# Patient Record
Sex: Female | Born: 1945 | Race: Black or African American | Hispanic: No | Smoking: Former smoker
Health system: Southern US, Community
[De-identification: ages and names within clinical notes are randomized; demographics above are authoritative.]

## PROBLEM LIST (undated history)

## (undated) DIAGNOSIS — E785 Hyperlipidemia, unspecified: Secondary | ICD-10-CM

## (undated) DIAGNOSIS — I890 Lymphedema, not elsewhere classified: Secondary | ICD-10-CM

## (undated) DIAGNOSIS — K635 Polyp of colon: Secondary | ICD-10-CM

## (undated) DIAGNOSIS — Z950 Presence of cardiac pacemaker: Secondary | ICD-10-CM

## (undated) DIAGNOSIS — J45909 Unspecified asthma, uncomplicated: Secondary | ICD-10-CM

## (undated) DIAGNOSIS — R42 Dizziness and giddiness: Secondary | ICD-10-CM

## (undated) DIAGNOSIS — M545 Low back pain, unspecified: Secondary | ICD-10-CM

## (undated) DIAGNOSIS — I509 Heart failure, unspecified: Secondary | ICD-10-CM

## (undated) DIAGNOSIS — T7840XA Allergy, unspecified, initial encounter: Secondary | ICD-10-CM

## (undated) DIAGNOSIS — M1990 Unspecified osteoarthritis, unspecified site: Secondary | ICD-10-CM

## (undated) DIAGNOSIS — C50919 Malignant neoplasm of unspecified site of unspecified female breast: Secondary | ICD-10-CM

## (undated) DIAGNOSIS — Z95810 Presence of automatic (implantable) cardiac defibrillator: Secondary | ICD-10-CM

## (undated) DIAGNOSIS — I10 Essential (primary) hypertension: Secondary | ICD-10-CM

## (undated) DIAGNOSIS — I447 Left bundle-branch block, unspecified: Secondary | ICD-10-CM

## (undated) DIAGNOSIS — T753XXA Motion sickness, initial encounter: Secondary | ICD-10-CM

## (undated) DIAGNOSIS — I428 Other cardiomyopathies: Secondary | ICD-10-CM

## (undated) DIAGNOSIS — Z0000 Encounter for general adult medical examination without abnormal findings: Principal | ICD-10-CM

## (undated) DIAGNOSIS — K5900 Constipation, unspecified: Secondary | ICD-10-CM

## (undated) HISTORY — DX: Left bundle-branch block, unspecified: I44.7

## (undated) HISTORY — DX: Constipation, unspecified: K59.00

## (undated) HISTORY — PX: POLYPECTOMY: SHX149

## (undated) HISTORY — DX: Essential (primary) hypertension: I10

## (undated) HISTORY — DX: Dizziness and giddiness: R42

## (undated) HISTORY — DX: Low back pain: M54.5

## (undated) HISTORY — PX: KNEE ARTHROSCOPY: SHX127

## (undated) HISTORY — DX: Allergy, unspecified, initial encounter: T78.40XA

## (undated) HISTORY — PX: COLONOSCOPY: SHX174

## (undated) HISTORY — DX: Encounter for general adult medical examination without abnormal findings: Z00.00

## (undated) HISTORY — DX: Motion sickness, initial encounter: T75.3XXA

## (undated) HISTORY — DX: Hyperlipidemia, unspecified: E78.5

## (undated) HISTORY — DX: Unspecified osteoarthritis, unspecified site: M19.90

## (undated) HISTORY — PX: VAGINAL HYSTERECTOMY: SUR661

## (undated) HISTORY — PX: CARDIAC CATHETERIZATION: SHX172

## (undated) HISTORY — PX: TONSILLECTOMY: SUR1361

## (undated) HISTORY — DX: Unspecified asthma, uncomplicated: J45.909

## (undated) HISTORY — DX: Polyp of colon: K63.5

## (undated) HISTORY — DX: Other cardiomyopathies: I42.8

## (undated) HISTORY — DX: Low back pain, unspecified: M54.50

---

## 1998-01-18 DIAGNOSIS — C50919 Malignant neoplasm of unspecified site of unspecified female breast: Secondary | ICD-10-CM

## 1998-01-18 HISTORY — DX: Malignant neoplasm of unspecified site of unspecified female breast: C50.919

## 1998-01-18 HISTORY — PX: BREAST LUMPECTOMY: SHX2

## 1998-01-18 HISTORY — PX: BREAST BIOPSY: SHX20

## 2003-09-19 ENCOUNTER — Encounter: Admission: RE | Admit: 2003-09-19 | Discharge: 2003-09-19 | Payer: Self-pay | Admitting: Hematology & Oncology

## 2003-12-09 ENCOUNTER — Ambulatory Visit: Payer: Self-pay | Admitting: Hematology & Oncology

## 2003-12-11 ENCOUNTER — Ambulatory Visit (HOSPITAL_COMMUNITY): Admission: RE | Admit: 2003-12-11 | Discharge: 2003-12-11 | Payer: Self-pay | Admitting: Hematology & Oncology

## 2004-01-14 ENCOUNTER — Ambulatory Visit (HOSPITAL_COMMUNITY): Admission: RE | Admit: 2004-01-14 | Discharge: 2004-01-14 | Payer: Self-pay | Admitting: Hematology & Oncology

## 2004-02-10 ENCOUNTER — Ambulatory Visit: Payer: Self-pay | Admitting: Hematology & Oncology

## 2004-03-03 ENCOUNTER — Ambulatory Visit (HOSPITAL_COMMUNITY): Admission: RE | Admit: 2004-03-03 | Discharge: 2004-03-03 | Payer: Self-pay | Admitting: Hematology & Oncology

## 2004-03-25 ENCOUNTER — Ambulatory Visit: Payer: Self-pay | Admitting: Hematology & Oncology

## 2004-06-25 ENCOUNTER — Ambulatory Visit: Payer: Self-pay | Admitting: Hematology & Oncology

## 2004-07-14 ENCOUNTER — Emergency Department (HOSPITAL_COMMUNITY): Admission: EM | Admit: 2004-07-14 | Discharge: 2004-07-15 | Payer: Self-pay | Admitting: Emergency Medicine

## 2004-08-17 ENCOUNTER — Encounter: Admission: RE | Admit: 2004-08-17 | Discharge: 2004-08-17 | Payer: Self-pay | Admitting: Orthopaedic Surgery

## 2004-09-08 ENCOUNTER — Ambulatory Visit: Payer: Self-pay | Admitting: Hematology & Oncology

## 2004-09-09 ENCOUNTER — Ambulatory Visit (HOSPITAL_COMMUNITY): Admission: RE | Admit: 2004-09-09 | Discharge: 2004-09-09 | Payer: Self-pay | Admitting: Hematology & Oncology

## 2004-11-26 ENCOUNTER — Ambulatory Visit: Payer: Self-pay | Admitting: Hematology & Oncology

## 2005-01-18 HISTORY — PX: TRIGGER FINGER RELEASE: SHX641

## 2005-01-18 HISTORY — PX: CARPAL TUNNEL RELEASE: SHX101

## 2005-01-25 ENCOUNTER — Ambulatory Visit: Payer: Self-pay | Admitting: Hematology & Oncology

## 2005-02-16 ENCOUNTER — Ambulatory Visit (HOSPITAL_COMMUNITY): Admission: RE | Admit: 2005-02-16 | Discharge: 2005-02-16 | Payer: Self-pay | Admitting: Hematology & Oncology

## 2005-05-20 ENCOUNTER — Ambulatory Visit: Payer: Self-pay | Admitting: Hematology & Oncology

## 2005-05-24 LAB — CBC WITH DIFFERENTIAL/PLATELET
BASO%: 0.5 % (ref 0.0–2.0)
EOS%: 4.4 % (ref 0.0–7.0)
Eosinophils Absolute: 0.3 10*3/uL (ref 0.0–0.5)
LYMPH%: 32.5 % (ref 14.0–48.0)
MCH: 30.4 pg (ref 26.0–34.0)
MCHC: 33.8 g/dL (ref 32.0–36.0)
MCV: 89.7 fL (ref 81.0–101.0)
MONO%: 6.6 % (ref 0.0–13.0)
Platelets: 279 10*3/uL (ref 145–400)
RBC: 3.93 10*6/uL (ref 3.70–5.32)
RDW: 14.5 % (ref 11.3–14.5)

## 2005-05-24 LAB — COMPREHENSIVE METABOLIC PANEL
AST: 27 U/L (ref 0–37)
Albumin: 4.2 g/dL (ref 3.5–5.2)
BUN: 11 mg/dL (ref 6–23)
Calcium: 9.4 mg/dL (ref 8.4–10.5)
Chloride: 102 mEq/L (ref 96–112)
Glucose, Bld: 142 mg/dL — ABNORMAL HIGH (ref 70–99)
Potassium: 3.7 mEq/L (ref 3.5–5.3)

## 2005-07-12 ENCOUNTER — Encounter: Payer: Self-pay | Admitting: Cardiology

## 2005-07-16 ENCOUNTER — Ambulatory Visit: Payer: Self-pay | Admitting: Hematology & Oncology

## 2005-07-19 ENCOUNTER — Ambulatory Visit (HOSPITAL_COMMUNITY): Admission: RE | Admit: 2005-07-19 | Discharge: 2005-07-19 | Payer: Self-pay | Admitting: Hematology & Oncology

## 2005-07-26 LAB — COMPREHENSIVE METABOLIC PANEL
ALT: 19 U/L (ref 0–40)
AST: 20 U/L (ref 0–37)
Albumin: 4.1 g/dL (ref 3.5–5.2)
Alkaline Phosphatase: 94 U/L (ref 39–117)
BUN: 12 mg/dL (ref 6–23)
Calcium: 9.6 mg/dL (ref 8.4–10.5)
Chloride: 106 mEq/L (ref 96–112)
Potassium: 4.1 mEq/L (ref 3.5–5.3)
Sodium: 139 mEq/L (ref 135–145)
Total Protein: 7.5 g/dL (ref 6.0–8.3)

## 2005-07-26 LAB — CBC WITH DIFFERENTIAL/PLATELET
Basophils Absolute: 0 10*3/uL (ref 0.0–0.1)
EOS%: 5 % (ref 0.0–7.0)
HGB: 12.1 g/dL (ref 11.6–15.9)
MCH: 30.9 pg (ref 26.0–34.0)
MCV: 90.8 fL (ref 81.0–101.0)
MONO%: 9.9 % (ref 0.0–13.0)
NEUT#: 2.6 10*3/uL (ref 1.5–6.5)
RBC: 3.91 10*6/uL (ref 3.70–5.32)
RDW: 14.9 % — ABNORMAL HIGH (ref 11.3–14.5)
lymph#: 2.6 10*3/uL (ref 0.9–3.3)

## 2005-10-06 ENCOUNTER — Encounter: Admission: RE | Admit: 2005-10-06 | Discharge: 2005-10-06 | Payer: Self-pay | Admitting: Orthopaedic Surgery

## 2005-10-21 ENCOUNTER — Ambulatory Visit: Payer: Self-pay | Admitting: Hematology & Oncology

## 2005-10-25 LAB — CBC WITH DIFFERENTIAL/PLATELET
Basophils Absolute: 0 10*3/uL (ref 0.0–0.1)
HCT: 36.1 % (ref 34.8–46.6)
HGB: 12.6 g/dL (ref 11.6–15.9)
MONO#: 0.5 10*3/uL (ref 0.1–0.9)
NEUT#: 2.5 10*3/uL (ref 1.5–6.5)
NEUT%: 38.1 % — ABNORMAL LOW (ref 39.6–76.8)
RDW: 14.2 % (ref 11.3–14.5)
WBC: 6.5 10*3/uL (ref 3.9–10.0)
lymph#: 3.1 10*3/uL (ref 0.9–3.3)

## 2005-10-25 LAB — COMPREHENSIVE METABOLIC PANEL
ALT: 18 U/L (ref 0–40)
Albumin: 4.3 g/dL (ref 3.5–5.2)
BUN: 15 mg/dL (ref 6–23)
CO2: 27 mEq/L (ref 19–32)
Calcium: 9.1 mg/dL (ref 8.4–10.5)
Chloride: 101 mEq/L (ref 96–112)
Creatinine, Ser: 0.81 mg/dL (ref 0.40–1.20)
Potassium: 3.6 mEq/L (ref 3.5–5.3)

## 2005-10-25 LAB — CANCER ANTIGEN 27.29: CA 27.29: 45 U/mL — ABNORMAL HIGH (ref 0–39)

## 2005-11-04 ENCOUNTER — Ambulatory Visit (HOSPITAL_BASED_OUTPATIENT_CLINIC_OR_DEPARTMENT_OTHER): Admission: RE | Admit: 2005-11-04 | Discharge: 2005-11-04 | Payer: Self-pay | Admitting: Orthopedic Surgery

## 2006-01-19 ENCOUNTER — Ambulatory Visit: Payer: Self-pay | Admitting: Hematology & Oncology

## 2006-01-24 LAB — COMPREHENSIVE METABOLIC PANEL
ALT: 18 U/L (ref 0–35)
AST: 18 U/L (ref 0–37)
Albumin: 4.3 g/dL (ref 3.5–5.2)
Alkaline Phosphatase: 97 U/L (ref 39–117)
Potassium: 3.8 mEq/L (ref 3.5–5.3)
Sodium: 140 mEq/L (ref 135–145)
Total Protein: 7.7 g/dL (ref 6.0–8.3)

## 2006-01-24 LAB — CBC WITH DIFFERENTIAL/PLATELET
BASO%: 1.4 % (ref 0.0–2.0)
EOS%: 4.8 % (ref 0.0–7.0)
Eosinophils Absolute: 0.3 10*3/uL (ref 0.0–0.5)
MCH: 28.5 pg (ref 26.0–34.0)
MCHC: 31.6 g/dL — ABNORMAL LOW (ref 32.0–36.0)
MCV: 90 fL (ref 81.0–101.0)
MONO%: 8.2 % (ref 0.0–13.0)
NEUT#: 2.7 10*3/uL (ref 1.5–6.5)
RBC: 4.23 10*6/uL (ref 3.70–5.32)
RDW: 15.1 % — ABNORMAL HIGH (ref 11.3–14.5)

## 2006-03-02 ENCOUNTER — Ambulatory Visit (HOSPITAL_COMMUNITY): Admission: RE | Admit: 2006-03-02 | Discharge: 2006-03-02 | Payer: Self-pay | Admitting: Hematology & Oncology

## 2006-03-23 ENCOUNTER — Ambulatory Visit: Payer: Self-pay | Admitting: Hematology & Oncology

## 2006-05-09 ENCOUNTER — Ambulatory Visit: Payer: Self-pay | Admitting: Hematology & Oncology

## 2006-09-01 ENCOUNTER — Ambulatory Visit: Payer: Self-pay | Admitting: Hematology & Oncology

## 2006-09-05 LAB — CBC WITH DIFFERENTIAL/PLATELET
BASO%: 1 % (ref 0.0–2.0)
EOS%: 5.2 % (ref 0.0–7.0)
HCT: 36.6 % (ref 34.8–46.6)
LYMPH%: 47.4 % (ref 14.0–48.0)
MCH: 31 pg (ref 26.0–34.0)
MCHC: 35.4 g/dL (ref 32.0–36.0)
NEUT%: 37.8 % — ABNORMAL LOW (ref 39.6–76.8)
Platelets: 264 10*3/uL (ref 145–400)

## 2006-09-05 LAB — COMPREHENSIVE METABOLIC PANEL
ALT: 19 U/L (ref 0–35)
AST: 22 U/L (ref 0–37)
CO2: 23 mEq/L (ref 19–32)
Creatinine, Ser: 0.72 mg/dL (ref 0.40–1.20)
Total Bilirubin: 0.4 mg/dL (ref 0.3–1.2)

## 2006-09-05 LAB — CANCER ANTIGEN 27.29: CA 27.29: 37 U/mL (ref 0–39)

## 2006-12-29 ENCOUNTER — Ambulatory Visit: Payer: Self-pay | Admitting: Hematology & Oncology

## 2007-01-02 LAB — COMPREHENSIVE METABOLIC PANEL
AST: 18 U/L (ref 0–37)
Albumin: 4.1 g/dL (ref 3.5–5.2)
BUN: 11 mg/dL (ref 6–23)
CO2: 25 mEq/L (ref 19–32)
Calcium: 9.8 mg/dL (ref 8.4–10.5)
Chloride: 103 mEq/L (ref 96–112)
Creatinine, Ser: 0.71 mg/dL (ref 0.40–1.20)
Glucose, Bld: 85 mg/dL (ref 70–99)

## 2007-01-02 LAB — CBC WITH DIFFERENTIAL/PLATELET
Basophils Absolute: 0.1 10*3/uL (ref 0.0–0.1)
EOS%: 4.7 % (ref 0.0–7.0)
Eosinophils Absolute: 0.3 10*3/uL (ref 0.0–0.5)
HCT: 34.1 % — ABNORMAL LOW (ref 34.8–46.6)
HGB: 12 g/dL (ref 11.6–15.9)
MCH: 31.3 pg (ref 26.0–34.0)
NEUT#: 3.3 10*3/uL (ref 1.5–6.5)
NEUT%: 44.7 % (ref 39.6–76.8)
lymph#: 3.1 10*3/uL (ref 0.9–3.3)

## 2007-01-02 LAB — CANCER ANTIGEN 27.29: CA 27.29: 37 U/mL (ref 0–39)

## 2007-01-19 HISTORY — PX: APPENDECTOMY: SHX54

## 2007-04-26 ENCOUNTER — Ambulatory Visit: Payer: Self-pay | Admitting: Hematology & Oncology

## 2007-05-01 LAB — COMPREHENSIVE METABOLIC PANEL
AST: 17 U/L (ref 0–37)
BUN: 12 mg/dL (ref 6–23)
CO2: 24 mEq/L (ref 19–32)
Calcium: 10.2 mg/dL (ref 8.4–10.5)
Chloride: 102 mEq/L (ref 96–112)
Creatinine, Ser: 0.74 mg/dL (ref 0.40–1.20)
Glucose, Bld: 100 mg/dL — ABNORMAL HIGH (ref 70–99)

## 2007-05-01 LAB — CBC WITH DIFFERENTIAL/PLATELET
Basophils Absolute: 0.1 10*3/uL (ref 0.0–0.1)
EOS%: 4.3 % (ref 0.0–7.0)
Eosinophils Absolute: 0.3 10*3/uL (ref 0.0–0.5)
HCT: 35.8 % (ref 34.8–46.6)
HGB: 12.6 g/dL (ref 11.6–15.9)
MCH: 31.1 pg (ref 26.0–34.0)
NEUT%: 47 % (ref 39.6–76.8)
lymph#: 2.7 10*3/uL (ref 0.9–3.3)

## 2007-07-23 ENCOUNTER — Encounter (INDEPENDENT_AMBULATORY_CARE_PROVIDER_SITE_OTHER): Payer: Self-pay | Admitting: Surgery

## 2007-07-23 ENCOUNTER — Encounter: Payer: Self-pay | Admitting: Emergency Medicine

## 2007-07-23 ENCOUNTER — Inpatient Hospital Stay (HOSPITAL_COMMUNITY): Admission: EM | Admit: 2007-07-23 | Discharge: 2007-07-25 | Payer: Self-pay | Admitting: Emergency Medicine

## 2007-08-31 ENCOUNTER — Ambulatory Visit: Payer: Self-pay | Admitting: Hematology & Oncology

## 2007-09-04 LAB — CBC WITH DIFFERENTIAL (CANCER CENTER ONLY)
BASO%: 0.8 % (ref 0.0–2.0)
EOS%: 5.4 % (ref 0.0–7.0)
HCT: 37.1 % (ref 34.8–46.6)
LYMPH#: 3.4 10*3/uL — ABNORMAL HIGH (ref 0.9–3.3)
LYMPH%: 55.8 % — ABNORMAL HIGH (ref 14.0–48.0)
MCH: 30.2 pg (ref 26.0–34.0)
MCHC: 33.8 g/dL (ref 32.0–36.0)
MONO%: 6.4 % (ref 0.0–13.0)
NEUT%: 31.6 % — ABNORMAL LOW (ref 39.6–80.0)
RDW: 13.7 % (ref 10.5–14.6)

## 2007-09-04 LAB — CANCER ANTIGEN 27.29: CA 27.29: 42 U/mL — ABNORMAL HIGH (ref 0–39)

## 2007-09-04 LAB — COMPREHENSIVE METABOLIC PANEL
ALT: 18 U/L (ref 0–35)
AST: 21 U/L (ref 0–37)
CO2: 26 mEq/L (ref 19–32)
Creatinine, Ser: 0.68 mg/dL (ref 0.40–1.20)
Total Bilirubin: 0.3 mg/dL (ref 0.3–1.2)

## 2007-12-29 ENCOUNTER — Ambulatory Visit: Payer: Self-pay | Admitting: Hematology & Oncology

## 2008-01-01 LAB — CBC WITH DIFFERENTIAL (CANCER CENTER ONLY)
BASO%: 0.7 % (ref 0.0–2.0)
HCT: 37.7 % (ref 34.8–46.6)
LYMPH%: 56.5 % — ABNORMAL HIGH (ref 14.0–48.0)
MCHC: 34.1 g/dL (ref 32.0–36.0)
MCV: 90 fL (ref 81–101)
MONO#: 0.6 10*3/uL (ref 0.1–0.9)
NEUT%: 29.9 % — ABNORMAL LOW (ref 39.6–80.0)
RDW: 12.8 % (ref 10.5–14.6)
WBC: 6.6 10*3/uL (ref 3.9–10.0)

## 2008-01-01 LAB — COMPREHENSIVE METABOLIC PANEL
ALT: 16 U/L (ref 0–35)
AST: 17 U/L (ref 0–37)
Albumin: 4.4 g/dL (ref 3.5–5.2)
Calcium: 10 mg/dL (ref 8.4–10.5)
Chloride: 102 mEq/L (ref 96–112)
Potassium: 4 mEq/L (ref 3.5–5.3)

## 2008-03-13 ENCOUNTER — Encounter: Payer: Self-pay | Admitting: Cardiology

## 2008-03-21 ENCOUNTER — Encounter: Payer: Self-pay | Admitting: Cardiology

## 2008-03-26 ENCOUNTER — Encounter: Payer: Self-pay | Admitting: Cardiology

## 2008-04-19 ENCOUNTER — Ambulatory Visit: Payer: Self-pay | Admitting: Hematology & Oncology

## 2008-04-22 LAB — CBC WITH DIFFERENTIAL (CANCER CENTER ONLY)
Eosinophils Absolute: 0.4 10*3/uL (ref 0.0–0.5)
HCT: 35.9 % (ref 34.8–46.6)
HGB: 11.7 g/dL (ref 11.6–15.9)
LYMPH%: 49.2 % — ABNORMAL HIGH (ref 14.0–48.0)
MCV: 92 fL (ref 81–101)
MONO#: 0.4 10*3/uL (ref 0.1–0.9)
NEUT%: 37.8 % — ABNORMAL LOW (ref 39.6–80.0)
RBC: 3.91 10*6/uL (ref 3.70–5.32)
WBC: 6.4 10*3/uL (ref 3.9–10.0)

## 2008-04-22 LAB — COMPREHENSIVE METABOLIC PANEL
BUN: 10 mg/dL (ref 6–23)
CO2: 26 mEq/L (ref 19–32)
Calcium: 9.9 mg/dL (ref 8.4–10.5)
Chloride: 103 mEq/L (ref 96–112)
Creatinine, Ser: 0.61 mg/dL (ref 0.40–1.20)

## 2008-04-22 LAB — CANCER ANTIGEN 27.29: CA 27.29: 42 U/mL — ABNORMAL HIGH (ref 0–39)

## 2008-09-03 ENCOUNTER — Ambulatory Visit: Payer: Self-pay | Admitting: Hematology & Oncology

## 2008-09-04 LAB — CBC WITH DIFFERENTIAL (CANCER CENTER ONLY)
Eosinophils Absolute: 0.4 10*3/uL (ref 0.0–0.5)
MCV: 90 fL (ref 81–101)
MONO#: 0.4 10*3/uL (ref 0.1–0.9)
NEUT#: 2.6 10*3/uL (ref 1.5–6.5)
Platelets: 282 10*3/uL (ref 145–400)
RBC: 4.1 10*6/uL (ref 3.70–5.32)
WBC: 6.7 10*3/uL (ref 3.9–10.0)

## 2008-09-04 LAB — COMPREHENSIVE METABOLIC PANEL
AST: 20 U/L (ref 0–37)
Albumin: 4.2 g/dL (ref 3.5–5.2)
Alkaline Phosphatase: 91 U/L (ref 39–117)
BUN: 13 mg/dL (ref 6–23)
Creatinine, Ser: 0.77 mg/dL (ref 0.40–1.20)
Potassium: 4 mEq/L (ref 3.5–5.3)

## 2008-09-18 ENCOUNTER — Encounter: Admission: RE | Admit: 2008-09-18 | Discharge: 2008-11-01 | Payer: Self-pay | Admitting: Hematology & Oncology

## 2008-11-20 ENCOUNTER — Encounter: Payer: Self-pay | Admitting: Cardiology

## 2009-02-28 ENCOUNTER — Ambulatory Visit: Payer: Self-pay | Admitting: Hematology & Oncology

## 2009-03-03 LAB — CBC WITH DIFFERENTIAL (CANCER CENTER ONLY)
Eosinophils Absolute: 0.4 10*3/uL (ref 0.0–0.5)
HCT: 36.4 % (ref 34.8–46.6)
HGB: 12.3 g/dL (ref 11.6–15.9)
LYMPH%: 47 % (ref 14.0–48.0)
MCHC: 33.7 g/dL (ref 32.0–36.0)
MCV: 90 fL (ref 81–101)
MONO%: 6.3 % (ref 0.0–13.0)
NEUT#: 2.6 10*3/uL (ref 1.5–6.5)
RBC: 4.04 10*6/uL (ref 3.70–5.32)

## 2009-03-03 LAB — COMPREHENSIVE METABOLIC PANEL
ALT: 16 U/L (ref 0–35)
Alkaline Phosphatase: 95 U/L (ref 39–117)
BUN: 8 mg/dL (ref 6–23)
Calcium: 9.6 mg/dL (ref 8.4–10.5)
Chloride: 102 mEq/L (ref 96–112)
Creatinine, Ser: 0.72 mg/dL (ref 0.40–1.20)

## 2009-03-03 LAB — VITAMIN D 25 HYDROXY (VIT D DEFICIENCY, FRACTURES): Vit D, 25-Hydroxy: 44 ng/mL (ref 30–89)

## 2009-03-11 ENCOUNTER — Ambulatory Visit: Payer: Self-pay | Admitting: Internal Medicine

## 2009-03-11 DIAGNOSIS — E119 Type 2 diabetes mellitus without complications: Secondary | ICD-10-CM | POA: Insufficient documentation

## 2009-03-11 DIAGNOSIS — Z853 Personal history of malignant neoplasm of breast: Secondary | ICD-10-CM | POA: Insufficient documentation

## 2009-03-11 DIAGNOSIS — Z860100 Personal history of colon polyps, unspecified: Secondary | ICD-10-CM | POA: Insufficient documentation

## 2009-03-11 DIAGNOSIS — E782 Mixed hyperlipidemia: Secondary | ICD-10-CM | POA: Insufficient documentation

## 2009-03-11 DIAGNOSIS — M1990 Unspecified osteoarthritis, unspecified site: Secondary | ICD-10-CM | POA: Insufficient documentation

## 2009-03-11 DIAGNOSIS — I10 Essential (primary) hypertension: Secondary | ICD-10-CM | POA: Insufficient documentation

## 2009-03-11 DIAGNOSIS — M1710 Unilateral primary osteoarthritis, unspecified knee: Secondary | ICD-10-CM

## 2009-03-11 DIAGNOSIS — IMO0002 Reserved for concepts with insufficient information to code with codable children: Secondary | ICD-10-CM | POA: Insufficient documentation

## 2009-03-11 LAB — CONVERTED CEMR LAB
ALT: 17 units/L (ref 0–35)
Bilirubin, Direct: <0.1 mg/dL (ref 0.0–0.3)
Calcium: 9.4 mg/dL (ref 8.4–10.5)
Cholesterol: 253 mg/dL — ABNORMAL HIGH (ref 0–200)
Glucose, Bld: 78 mg/dL (ref 70–99)
HDL: 47 mg/dL (ref >39)
Hgb A1c MFr Bld: 6.2 % — ABNORMAL HIGH (ref 4.6–6.1)
Potassium: 3.8 meq/L (ref 3.5–5.3)
Sodium: 141 meq/L (ref 135–145)
TSH: 0.938 microintl units/mL (ref 0.350–4.500)
Total Bilirubin: 0.3 mg/dL (ref 0.3–1.2)
Total CHOL/HDL Ratio: 5.4
Triglycerides: 225 mg/dL — ABNORMAL HIGH (ref <150)

## 2009-03-17 ENCOUNTER — Telehealth: Payer: Self-pay | Admitting: Internal Medicine

## 2009-03-19 ENCOUNTER — Encounter: Payer: Self-pay | Admitting: Cardiology

## 2009-03-19 ENCOUNTER — Ambulatory Visit: Payer: Self-pay | Admitting: Cardiology

## 2009-03-19 ENCOUNTER — Ambulatory Visit: Payer: Self-pay | Admitting: Internal Medicine

## 2009-03-19 DIAGNOSIS — R079 Chest pain, unspecified: Secondary | ICD-10-CM | POA: Insufficient documentation

## 2009-04-07 ENCOUNTER — Ambulatory Visit: Payer: Self-pay

## 2009-04-07 ENCOUNTER — Ambulatory Visit: Payer: Self-pay | Admitting: Internal Medicine

## 2009-04-07 ENCOUNTER — Ambulatory Visit (HOSPITAL_COMMUNITY): Admission: RE | Admit: 2009-04-07 | Discharge: 2009-04-07 | Payer: Self-pay | Admitting: Cardiology

## 2009-04-07 ENCOUNTER — Ambulatory Visit: Payer: Self-pay | Admitting: Cardiology

## 2009-04-07 ENCOUNTER — Encounter: Payer: Self-pay | Admitting: Cardiology

## 2009-04-16 LAB — CONVERTED CEMR LAB
CO2: 31 meq/L (ref 19–32)
Chloride: 107 meq/L (ref 96–112)
Creatinine, Ser: 0.8 mg/dL (ref 0.4–1.2)
GFR calc non Af Amer: 92.94 mL/min (ref >60)
Sodium: 142 meq/L (ref 135–145)

## 2009-04-22 ENCOUNTER — Telehealth: Payer: Self-pay | Admitting: Internal Medicine

## 2009-04-25 ENCOUNTER — Encounter: Payer: Self-pay | Admitting: Internal Medicine

## 2009-04-25 ENCOUNTER — Ambulatory Visit: Payer: Self-pay | Admitting: Family

## 2009-04-25 ENCOUNTER — Ambulatory Visit: Payer: Self-pay | Admitting: Diagnostic Radiology

## 2009-04-25 ENCOUNTER — Ambulatory Visit (HOSPITAL_BASED_OUTPATIENT_CLINIC_OR_DEPARTMENT_OTHER): Admission: RE | Admit: 2009-04-25 | Discharge: 2009-04-25 | Payer: Self-pay | Admitting: Internal Medicine

## 2009-04-25 DIAGNOSIS — R05 Cough: Secondary | ICD-10-CM | POA: Insufficient documentation

## 2009-04-25 DIAGNOSIS — R059 Cough, unspecified: Secondary | ICD-10-CM | POA: Insufficient documentation

## 2009-04-25 LAB — CONVERTED CEMR LAB
BUN: 13 mg/dL (ref 6–23)
Chloride: 102 meq/L (ref 96–112)
Creatinine, Ser: 0.78 mg/dL (ref 0.40–1.20)
Glucose, Bld: 83 mg/dL (ref 70–99)
Total CK: 103 units/L (ref 7–177)

## 2009-04-27 ENCOUNTER — Telehealth: Payer: Self-pay | Admitting: Internal Medicine

## 2009-04-29 ENCOUNTER — Ambulatory Visit (HOSPITAL_BASED_OUTPATIENT_CLINIC_OR_DEPARTMENT_OTHER): Admission: RE | Admit: 2009-04-29 | Discharge: 2009-04-29 | Payer: Self-pay | Admitting: Internal Medicine

## 2009-04-29 ENCOUNTER — Ambulatory Visit: Payer: Self-pay | Admitting: Diagnostic Radiology

## 2009-04-29 ENCOUNTER — Telehealth (INDEPENDENT_AMBULATORY_CARE_PROVIDER_SITE_OTHER): Payer: Self-pay | Admitting: *Deleted

## 2009-04-29 ENCOUNTER — Ambulatory Visit: Payer: Self-pay | Admitting: Family

## 2009-04-29 DIAGNOSIS — N390 Urinary tract infection, site not specified: Secondary | ICD-10-CM | POA: Insufficient documentation

## 2009-04-29 DIAGNOSIS — R5381 Other malaise: Secondary | ICD-10-CM | POA: Insufficient documentation

## 2009-04-29 DIAGNOSIS — R5383 Other fatigue: Secondary | ICD-10-CM

## 2009-04-29 LAB — CONVERTED CEMR LAB
Basophils Absolute: 0 10*3/uL (ref 0.0–0.1)
Bilirubin Urine: NEGATIVE
Eosinophils Relative: 5 % (ref 0–5)
Hemoglobin: 12.4 g/dL (ref 12.0–15.0)
Lymphs Abs: 3.7 10*3/uL (ref 0.7–4.0)
MCV: 90.2 fL (ref 78.0–100.0)
Monocytes Absolute: 0.4 10*3/uL (ref 0.1–1.0)
Monocytes Relative: 5 % (ref 3–12)
Neutro Abs: 3.1 10*3/uL (ref 1.7–7.7)
Neutrophils Relative %: 41 % — ABNORMAL LOW (ref 43–77)
RBC: 4.07 M/uL (ref 3.87–5.11)
RDW: 14.3 % (ref 11.5–15.5)
Specific Gravity, Urine: 1.01
pH: 6

## 2009-05-06 ENCOUNTER — Telehealth: Payer: Self-pay | Admitting: Internal Medicine

## 2009-05-06 ENCOUNTER — Encounter: Payer: Self-pay | Admitting: Internal Medicine

## 2009-05-07 ENCOUNTER — Encounter: Payer: Self-pay | Admitting: Internal Medicine

## 2009-05-07 ENCOUNTER — Ambulatory Visit: Payer: Self-pay | Admitting: Family

## 2009-05-07 DIAGNOSIS — M79609 Pain in unspecified limb: Secondary | ICD-10-CM | POA: Insufficient documentation

## 2009-05-20 ENCOUNTER — Ambulatory Visit (HOSPITAL_BASED_OUTPATIENT_CLINIC_OR_DEPARTMENT_OTHER): Admission: RE | Admit: 2009-05-20 | Discharge: 2009-05-20 | Payer: Self-pay | Admitting: Internal Medicine

## 2009-05-20 ENCOUNTER — Telehealth: Payer: Self-pay | Admitting: Family

## 2009-05-20 ENCOUNTER — Ambulatory Visit: Payer: Self-pay | Admitting: Family

## 2009-05-20 ENCOUNTER — Ambulatory Visit: Payer: Self-pay | Admitting: Diagnostic Radiology

## 2009-05-20 ENCOUNTER — Telehealth: Payer: Self-pay | Admitting: Internal Medicine

## 2009-05-21 ENCOUNTER — Telehealth: Payer: Self-pay | Admitting: Family

## 2009-05-21 ENCOUNTER — Ambulatory Visit (HOSPITAL_BASED_OUTPATIENT_CLINIC_OR_DEPARTMENT_OTHER): Admission: RE | Admit: 2009-05-21 | Discharge: 2009-05-21 | Payer: Self-pay | Admitting: Internal Medicine

## 2009-05-21 ENCOUNTER — Ambulatory Visit: Payer: Self-pay | Admitting: Diagnostic Radiology

## 2009-05-23 ENCOUNTER — Telehealth (INDEPENDENT_AMBULATORY_CARE_PROVIDER_SITE_OTHER): Payer: Self-pay | Admitting: *Deleted

## 2009-06-11 ENCOUNTER — Encounter: Payer: Self-pay | Admitting: Internal Medicine

## 2009-07-02 ENCOUNTER — Encounter: Payer: Self-pay | Admitting: Cardiology

## 2009-07-02 ENCOUNTER — Ambulatory Visit: Payer: Self-pay | Admitting: Internal Medicine

## 2009-07-02 ENCOUNTER — Ambulatory Visit: Payer: Self-pay | Admitting: Cardiology

## 2009-07-02 DIAGNOSIS — K219 Gastro-esophageal reflux disease without esophagitis: Secondary | ICD-10-CM | POA: Insufficient documentation

## 2009-07-02 LAB — CONVERTED CEMR LAB
BUN: 13 mg/dL (ref 6–23)
CO2: 25 meq/L (ref 19–32)
Chloride: 103 meq/L (ref 96–112)
Glucose, Bld: 93 mg/dL (ref 70–99)
Hgb A1c MFr Bld: 6 % — ABNORMAL HIGH (ref <5.7)
Sodium: 139 meq/L (ref 135–145)

## 2009-07-03 ENCOUNTER — Encounter (INDEPENDENT_AMBULATORY_CARE_PROVIDER_SITE_OTHER): Payer: Self-pay | Admitting: *Deleted

## 2009-07-04 ENCOUNTER — Encounter: Payer: Self-pay | Admitting: Internal Medicine

## 2009-07-14 ENCOUNTER — Emergency Department (HOSPITAL_BASED_OUTPATIENT_CLINIC_OR_DEPARTMENT_OTHER): Admission: EM | Admit: 2009-07-14 | Discharge: 2009-07-14 | Payer: Self-pay | Admitting: Emergency Medicine

## 2009-07-14 ENCOUNTER — Ambulatory Visit: Payer: Self-pay | Admitting: Diagnostic Radiology

## 2009-07-22 ENCOUNTER — Encounter: Payer: Self-pay | Admitting: Internal Medicine

## 2009-07-23 ENCOUNTER — Encounter: Payer: Self-pay | Admitting: Internal Medicine

## 2009-08-31 ENCOUNTER — Emergency Department (HOSPITAL_BASED_OUTPATIENT_CLINIC_OR_DEPARTMENT_OTHER): Admission: EM | Admit: 2009-08-31 | Discharge: 2009-08-31 | Payer: Self-pay | Admitting: Emergency Medicine

## 2009-08-31 ENCOUNTER — Ambulatory Visit: Payer: Self-pay | Admitting: Diagnostic Radiology

## 2009-09-30 ENCOUNTER — Ambulatory Visit: Payer: Self-pay | Admitting: Gastroenterology

## 2009-09-30 DIAGNOSIS — R110 Nausea: Secondary | ICD-10-CM | POA: Insufficient documentation

## 2009-10-01 LAB — CONVERTED CEMR LAB
AST: 24 units/L (ref 0–37)
Alkaline Phosphatase: 85 units/L (ref 39–117)
BUN: 9 mg/dL (ref 6–23)
CO2: 32 meq/L (ref 19–32)
Chloride: 101 meq/L (ref 96–112)
Eosinophils Absolute: 0.5 10*3/uL (ref 0.0–0.7)
GFR calc non Af Amer: 108.26 mL/min (ref >60)
Hemoglobin: 12.9 g/dL (ref 12.0–15.0)
Lymphocytes Relative: 47.1 % — ABNORMAL HIGH (ref 12.0–46.0)
Lymphs Abs: 3.8 10*3/uL (ref 0.7–4.0)
MCHC: 34.3 g/dL (ref 30.0–36.0)
Monocytes Absolute: 0.5 10*3/uL (ref 0.1–1.0)
Platelets: 276 10*3/uL (ref 150.0–400.0)
Potassium: 3.6 meq/L (ref 3.5–5.1)
RBC: 4.06 M/uL (ref 3.87–5.11)
RDW: 14.4 % (ref 11.5–14.6)
Sodium: 141 meq/L (ref 135–145)

## 2009-10-08 ENCOUNTER — Ambulatory Visit (HOSPITAL_COMMUNITY): Admission: RE | Admit: 2009-10-08 | Discharge: 2009-10-08 | Payer: Self-pay | Admitting: Gastroenterology

## 2009-12-01 ENCOUNTER — Telehealth: Payer: Self-pay | Admitting: Internal Medicine

## 2009-12-15 ENCOUNTER — Ambulatory Visit: Payer: Self-pay | Admitting: Internal Medicine

## 2009-12-15 LAB — CONVERTED CEMR LAB
BUN: 8 mg/dL (ref 6–23)
Calcium: 10.3 mg/dL (ref 8.4–10.5)
Chloride: 101 meq/L (ref 96–112)
Glucose, Bld: 79 mg/dL (ref 70–99)
Potassium: 3.9 meq/L (ref 3.5–5.3)
Sodium: 140 meq/L (ref 135–145)

## 2009-12-22 ENCOUNTER — Encounter
Admission: RE | Admit: 2009-12-22 | Discharge: 2010-01-15 | Payer: Self-pay | Source: Home / Self Care | Attending: Internal Medicine | Admitting: Internal Medicine

## 2009-12-24 ENCOUNTER — Telehealth: Payer: Self-pay | Admitting: Internal Medicine

## 2009-12-25 ENCOUNTER — Encounter: Payer: Self-pay | Admitting: Internal Medicine

## 2010-01-23 ENCOUNTER — Encounter
Admission: RE | Admit: 2010-01-23 | Discharge: 2010-02-16 | Payer: Self-pay | Source: Home / Self Care | Attending: Internal Medicine | Admitting: Internal Medicine

## 2010-02-08 ENCOUNTER — Encounter: Payer: Self-pay | Admitting: Hematology & Oncology

## 2010-02-18 NOTE — Assessment & Plan Note (Signed)
Summary: LEG PAIB MHF   Vital Signs:  Patient profile:   65 year old female Height:      65.5 inches Weight:      241.25 pounds O2 Sat:      97 % on Room air Temp:     97.7 degrees F oral Pulse rate:   72 / minute Pulse rhythm:   regular Resp:     16 per minute BP sitting:   118 / 68  (right arm) Cuff size:   large  Vitals Entered By: Mervin Kung CMA (April 25, 2009 8:38 AM)  O2 Flow:  Room air CC: room 5   Pt states she has muscle aches in her legs and right arm x 6 days. Has started Crestor x 1 1/2 months ago.  Also notes a cough that is worse at night x 2 months.   Primary Care Provider:  Dondra Spry DO  CC:  room 5   Pt states she has muscle aches in her legs and right arm x 6 days. Has started Crestor x 1 1/2 months ago.  Also notes a cough that is worse at night x 2 months..  History of Present Illness: Brenda Marsh is a 65 year old female who presents with c/o severe muscle aches in bilateral legs.  Notes that she has also had some aching in the right upper extremity.  Notes that she started crestor 6 weeks ago.  Cough-  Notes that she has been having dry cough x several months.  Feels like she has some throat irritation.  + Frontal headaches but denies sinus congestion of nasal discharge. She has not tried any otc meds.  Notes some rectal bleeding about 3-4 weeks ago which she attributed to her history of internal and external hemorrhoids.    Allergies: 1)  ! Pcn 2)  ! Aleve (Naproxen Sodium) 3)  Lisinopril (Lisinopril) 4)  Simvastatin (Simvastatin)  Physical Exam  General:  Well-developed,well-nourished,in no acute distress; alert,appropriate and cooperative throughout examination Lungs:  Normal respiratory effort, chest expands symmetrically. Lungs are clear to auscultation, no crackles or wheezes. Heart:  Normal rate and regular rhythm. S1 and S2 normal without gallop, murmur, click, rub or other extra sounds.   Impression & Recommendations:  Problem # 1:   HYPERLIPIDEMIA (ICD-272.4) Assessment Comment Only Hold Crestor due to complaint of myalgias.  BMET, CK level normal.  Plan follow up with Dr Artist Pais in 2 weeks- can consider alternate therapy at that time, possibly Livalo.   Her updated medication list for this problem includes:    Crestor 10 Mg Tabs (Rosuvastatin calcium) ..... One by mouth qd  Orders: T-CK Total [id number]) T-Basic Metabolic Panel [phone])  Problem # 2:  COUGH (ICD-786.2) Assessment: New CXR notes chronic bronchitic changes without acute infiltrate.  Plan trial of Flovent.  Will also treat  with z-pak given hx of diabetes and findings of bronchitis on CXR Orders: CXR- 2view (CXR)  Complete Medication List: 1)  Caltrate 600+d 600-400 Mg-unit Tabs (Calcium carbonate-vitamin d) .... Take 1 tablet by mouth once a day 2)  Vitamin C 100 Mg Tabs (Ascorbic acid) .... Take 1 tablet by mouth daily 3)  Multivitamins Tabs (Multiple vitamin) .... Take 1 tablet by mouth once a day 4)  Potassium Chloride Cr 10 Meq Cr-caps (Potassium chloride) .... Take 1 capsule by mouth once a day 5)  Claritin 10 Mg Tabs (Loratadine) .... Take 1 tablet by mouth once a day as needed 6)  Carvedilol 6.25  Mg Tabs (Carvedilol) .... Take one tablet by mouth twice a day 7)  Alpha-lipoic Acid 200 Mg Caps (Alpha-lipoic acid) .... Take 1 capsule by mouth once a day 8)  Promethazine Hcl 25 Mg Tabs (Promethazine hcl) .... Take 1 tablet by mouth once a day as needed. 9)  Hydrochlorothiazide 25 Mg Tabs (Hydrochlorothiazide) .... Take 1 tablet by mouth once a day 10)  Diovan 160 Mg Tabs (Valsartan) .... One by mouth once daily 11)  B Complex Tabs (B complex vitamins) .... Take 1 tablet by mouth once a day 12)  Crestor 10 Mg Tabs (Rosuvastatin calcium) .... One by mouth qd 13)  Flovent Hfa 110 Mcg/act Aero (Fluticasone propionate  hfa) .... 2 puffs twice daily 14)  Zithromax Z-pak 250 Mg Tabs (Azithromycin) .... 2 tabs by mouth x 1 today, then one tablet by  mouth daily for 4 more days  Patient Instructions: 1)  Stop Crestor. 2)  Call if your symptoms worsen or do not improve.  3)  Follow up with Dr Artist Pais in 2 weeks Prescriptions: ZITHROMAX Z-PAK 250 MG TABS (AZITHROMYCIN) 2 tabs by mouth x 1 today, then one tablet by mouth daily for 4 more days  #1 pack x 0   Entered and Authorized by:   Lemont Fillers FNP   Signed by:   Lemont Fillers FNP on 04/28/2009   Method used:   Electronically to        [facility] (retail)       [address]       [place], [place]  [address]       Ph: [phone] or [phone]       Fax: [phone]   RxID:   [id number] FLOVENT HFA 110 MCG/ACT AERO (FLUTICASONE PROPIONATE  HFA) 2 puffs twice daily  #1 x 0   Entered and Authorized by:   Lemont Fillers FNP   Signed by:   Lemont Fillers FNP on 04/25/2009   Method used:   Electronically to        [facility] (retail)       [address]       [place], [place]  [address]       Ph: [phone] or [phone]       Fax: [phone]   RxID:   [id number]   Current Allergies (reviewed today): ! PCN ! ALEVE (NAPROXEN SODIUM) LISINOPRIL (LISINOPRIL) SIMVASTATIN (SIMVASTATIN)

## 2010-02-18 NOTE — Letter (Signed)
   [place] [facility] [address] [place], [place]  [address]  [place] Phone: [phone]      July 04, 2009   Brenda Marsh [address] [place], [place] [address]  RE:  LAB RESULTS  Dear  [name],  The following is an interpretation of your most recent lab tests.  Please take note of any instructions provided or changes to medications that have resulted from your lab work.  ELECTROLYTES:  Good - no changes needed  KIDNEY FUNCTION TESTS:  Good - no changes needed    DIABETIC STUDIES:  Good - no changes needed Blood Glucose: 93   HgbA1C: 6.0          Sincerely Yours,    Dr. Thomos Lemons

## 2010-02-18 NOTE — Letter (Signed)
Summary: [facility]  [facility] Note   Imported By: Roderic Ovens 04/08/2009 10:47:29  _____________________________________________________________________  External Attachment:    Type:   Image     Comment:   External Document

## 2010-02-18 NOTE — Medication Information (Signed)
Summary: Approval for Atacand/[employer]  Approval for Atacand/[employer]   Imported By: Lanelle Bal 05/19/2009 10:23:55  _____________________________________________________________________  External Attachment:    Type:   Image     Comment:   External Document

## 2010-02-18 NOTE — Assessment & Plan Note (Signed)
Summary: POSS. SIDE EFFECT TO BP MED. / TF,CMA   Vital Signs:  Patient profile:   65 year old female Height:      65.5 inches Weight:      238.75 pounds BMI:     39.27 Pulse rate:   78 / minute Pulse rhythm:   regular Resp:     16 per minute BP sitting:   138 / 78  (right arm) Cuff size:   large  Vitals Entered By: Mervin Kung CMA (April 29, 2009 10:34 AM) CC: room 5   Pt. states she is having back and leg pain since last Tuesday.  She states she is very fatigued and now is itching on her chest and legs since yesterday.   Primary Care Provider:  Dondra Spry DO  CC:  room 5   Pt. states she is having back and leg pain since last Tuesday.  She states she is very fatigued and now is itching on her chest and legs since yesterday.Marland Kitchen  History of Present Illness: Ms Brenda Marsh presents today with complaint of itching for several days,  poor energy and worsening low back pain.  Notes that her back and legs continue to ache (especially down bilateral shins)- this discomfort has been present for 10 days. There pain is relieved by nothing and she has not tried any medication for pain.   Patient denies dysuria or gross hematuria. Last visit, Crestor was held due to myalgias and she was found to have normal CK levels/bmet.  She was placed on Flovent for cough due to suspicion of RAD.  CXR was perfomed at that visit which revealed changes of chronic bronchitis.  She reports that her cough is improving with the use of the Flovent.  She has not yet picked up rx for Zithromax.   Patient also expresses concern RE: Diovan as she has had some skin issue and wonders if this is contributing to how she has been feeling.      Allergies: 1)  ! Pcn 2)  ! Aleve (Naproxen Sodium) 3)  ! Crestor 4)  Lisinopril (Lisinopril) 5)  Simvastatin (Simvastatin)  Physical Exam  General:  Overweight AA female who appears uncomfortable  Head:  Normocephalic and atraumatic without obvious abnormalities. No apparent  alopecia or balding. Ears:  External ear exam shows no significant lesions or deformities.  Otoscopic examination reveals clear canals, tympanic membranes are intact bilaterally without bulging, retraction, inflammation or discharge. Hearing is grossly normal bilaterally. Neck:  No deformities, masses, or tenderness noted. Lungs:  Normal respiratory effort, chest expands symmetrically. Lungs are clear to auscultation, no crackles or wheezes. Heart:  Normal rate and regular rhythm. S1 and S2 normal without gallop, murmur, click, rub or other extra sounds. Msk:  No deformity or scoliosis noted of thoracic or lumbar spine.  No tenderness to palpation of lumbar spine or lower back.  Negative CVAT Extremities:  + lymphadema LUE Skin:  No skin rashes   Impression & Recommendations:  Problem # 1:  UTI (ICD-599.0) Assessment New UA is + for blood and leukocytes.   I suspect that UTI is the main cause of her malaise/fatigue.  CT was negative for kidney stone.  Pt instructed not to fill zithromax.  Will plan to treat instead with Levaquin given history of severe penicillin allergy.  Check CBC, follow up in 1 week.  Pt is requesting and rx for fluconozole to use as needed yeast infection with abx as she is very prone.  Her updated medication  list for this problem includes:    Zithromax Z-pak 250 Mg Tabs (Azithromycin) .Marland Kitchen... 2 tabs by mouth x 1 today, then one tablet by mouth daily for 4 more days    Levaquin 500 Mg Tabs (Levofloxacin) ..... One tablet by mouth daily x 7 days  Her updated medication list for this problem includes:    Zithromax Z-pak 250 Mg Tabs (Azithromycin) .Marland Kitchen... 2 tabs by mouth x 1 today, then one tablet by mouth daily for 4 more days  Orders: T-CBC w/Diff ([id number]) Misc. Referral (Misc. Ref)  Problem # 2:  HYPERTENSION (ICD-401.9) Assessment: Comment Only Patient has discontinued Diovan on her own as she believes that this is causing her malaise and muscle aches and itching.   She has resumed Atacand and tells me that she wishes to pay additional cost for this medication.  Long discussion with patient in regards to this issue.   The following medications were removed from the medication list:    Diovan 160 Mg Tabs (Valsartan) ..... One by mouth once daily Her updated medication list for this problem includes:    Carvedilol 6.25 Mg Tabs (Carvedilol) .Marland Kitchen... Take one tablet by mouth twice a day    Hydrochlorothiazide 25 Mg Tabs (Hydrochlorothiazide) .Marland Kitchen... Take 1 tablet by mouth once a day  The following medications were removed from the medication list:    Diovan 160 Mg Tabs (Valsartan) ..... One by mouth once daily Her updated medication list for this problem includes:    Carvedilol 6.25 Mg Tabs (Carvedilol) .Marland Kitchen... Take one tablet by mouth twice a day    Hydrochlorothiazide 25 Mg Tabs (Hydrochlorothiazide) .Marland Kitchen... Take 1 tablet by mouth once a day  BP today: 138/78 Prior BP: 118/68 (04/25/2009)  Labs Reviewed: K+: 4.0 (04/25/2009) Creat: : 0.78 (04/25/2009)   Chol: 253 (03/11/2009)   HDL: 47 (03/11/2009)   LDL: 161 (03/11/2009)   TG: 225 (03/11/2009)  Problem # 3:  BACK PAIN (ICD-724.5) Assessment: Deteriorated CT notes severe lumbar spondylosis,  this is likely contributing to her pain.  She is unable to take NSAIDS,  I recommended trial of Tylenol for now.   Orders: UA Dipstick w/o Micro (manual) ([address])  Complete Medication List: 1)  Caltrate 600+d 600-400 Mg-unit Tabs (Calcium carbonate-vitamin d) .... Take 1 tablet by mouth once a day 2)  Vitamin C 100 Mg Tabs (Ascorbic acid) .... Take 1 tablet by mouth daily 3)  Multivitamins Tabs (Multiple vitamin) .... Take 1 tablet by mouth once a day 4)  Potassium Chloride Cr 10 Meq Cr-caps (Potassium chloride) .... Take 1 capsule by mouth once a day 5)  Claritin 10 Mg Tabs (Loratadine) .... Take 1 tablet by mouth once a day as needed 6)  Carvedilol 6.25 Mg Tabs (Carvedilol) .... Take one tablet by mouth twice a day 7)   Alpha-lipoic Acid 200 Mg Caps (Alpha-lipoic acid) .... Take 1 capsule by mouth once a day 8)  Promethazine Hcl 25 Mg Tabs (Promethazine hcl) .... Take 1 tablet by mouth once a day as needed. 9)  Hydrochlorothiazide 25 Mg Tabs (Hydrochlorothiazide) .... Take 1 tablet by mouth once a day 10)  B Complex Tabs (B complex vitamins) .... Take 1 tablet by mouth once a day 11)  Flovent Hfa 110 Mcg/act Aero (Fluticasone propionate  hfa) .... 2 puffs twice daily 12)  Zithromax Z-pak 250 Mg Tabs (Azithromycin) .... 2 tabs by mouth x 1 today, then one tablet by mouth daily for 4 more days 13)  Levaquin 500 Mg Tabs (Levofloxacin) .Marland KitchenMarland KitchenMarland Kitchen  One tablet by mouth daily x 7 days 14)  Fluconazole 150 Mg Tabs (Fluconazole) .... One tab by mouth as needed yeast infection, may repeat in 3 days if needed  Other Orders: T-Culture, Urine ([id number]) Specimen Handling ([id number])  Patient Instructions: 1)  Complete your Ct scan of the chest today. 2)  Start Levaquin (do not fill the z-pak) 3)  Take 650-1000mg  of Tylenol every 4-6 hours as needed for relief of pain or comfort of fever AVOID taking more than 4000mg   in a 24 hour period (can cause liver damage in higher doses). 4)  Follow up in 1 week. Prescriptions: FLUCONAZOLE 150 MG TABS (FLUCONAZOLE) one tab by mouth as needed yeast infection, may repeat in 3 days if needed  #2 x 0   Entered and Authorized by:   Lemont Fillers FNP   Signed by:   Mervin Kung CMA on 04/29/2009   Method used:   Electronically to        [facility] (retail)       [address]       [place], [place]  [address]       Ph: [phone] or [phone]       Fax: [phone]   RxID:   [id number] LEVAQUIN 500 MG TABS (LEVOFLOXACIN) one tablet by mouth daily x 7 days  #7 x 0   Entered and Authorized by:   Lemont Fillers FNP   Signed by:   Mervin Kung CMA on 04/29/2009   Method used:   Electronically to        [facility] (retail)       [address]       [place], [place]  [address]       Ph: [phone] or [phone]       Fax: [phone]   RxID:   [id number]   Current Allergies (reviewed today): ! PCN ! ALEVE (NAPROXEN SODIUM) ! CRESTOR LISINOPRIL (LISINOPRIL) SIMVASTATIN (SIMVASTATIN)  Laboratory Results   Urine Tests    Routine Urinalysis   Color: yellow Appearance: Clear Glucose: negative   (Normal Range: Negative) Bilirubin: negative   (Normal Range: Negative) Ketone: negative   (Normal Range: Negative) Spec. Gravity: 1.010   (Normal Range: 1.003-1.035) Blood: moderate   (Normal Range: Negative) pH: 6.0   (Normal Range: 5.0-8.0) Protein: trace   (Normal Range: Negative) Urobilinogen: 0.2   (Normal Range: 0-1) Nitrite: negative   (Normal Range: Negative) Leukocyte Esterace: moderate   (Normal Range: Negative)

## 2010-02-18 NOTE — Cardiovascular Report (Signed)
Summary: [facility] Diagnostic Report  [facility] Diagnostic Report   Imported By: Roderic Ovens 04/08/2009 10:49:52  _____________________________________________________________________  External Attachment:    Type:   Image     Comment:   External Document

## 2010-02-18 NOTE — Progress Notes (Signed)
  Phone Note Outgoing Call   Call placed by: Lemont Fillers FNP,  May 20, 2009 3:42 PM Summary of Call: Left message for patient to return my call.  When patient calls back will advise her that her doppler is negative for DVT. Initial call taken by: Lemont Fillers FNP,  May 20, 2009 3:43 PM  Follow-up for Phone Call        Reviewed results with patient.  She will have MRI in AM, will call in ativan to be taken prior to MRI due to claustraphobia. Follow-up by: Lemont Fillers FNP,  May 20, 2009 3:53 PM

## 2010-02-18 NOTE — Progress Notes (Signed)
Summary:  neurosurgery does not accept her ins   Phone Note From Other Clinic   Caller: [facility] Call For: Brenda Marsh  Summary of Call: they do not accept her insurance  Initial call taken by: Roselle Locus,  May 23, 2009 9:50 AM  Follow-up for Phone Call        New referral request fax to [facility] Follow-up by: Darral Dash,  May 23, 2009 3:57 PM

## 2010-02-18 NOTE — Progress Notes (Signed)
Summary: At[employer] Prior Authorization  Phone Note Outgoing Call Call back at [phone]   Call placed by: Glendell Docker CMA,  May 06, 2009 11:31 AM Call placed to: [facility] Summary of Call: Spoke with Malena Catholic at [place], Prior Auhtorization initiated on Atacand 16mg . Awaiting forms for completion Initial call taken by: Glendell Docker CMA,  May 06, 2009 11:32 AM  Follow-up for Phone Call        paperwork received for prior authorization, signed and faxed. Awaiting approval status from insurance company Follow-up by: Glendell Docker CMA,  May 06, 2009 12:23 PM  Additional Follow-up for Phone Call Additional follow up Details #1::        voice message received from  [place] with [facility], she states Atacand has been approved for patient indefinitely as long as patient has [employer]. Call placed to patient at  [phone], no answer, voice message left informing patient  medication has been approved  Additional Follow-up by: Glendell Docker CMA,  May 12, 2009 1:29 PM

## 2010-02-18 NOTE — Assessment & Plan Note (Signed)
Summary: Pain in left leg/hea   Vital Signs:  Patient profile:   65 year old female Height:      65.5 inches Weight:      234.75 pounds BMI:     38.61 Temp:     97.8 degrees F oral Pulse rate:   84 / minute Pulse rhythm:   regular Resp:     16 per minute BP sitting:   122 / 64  (right arm) Cuff size:   large  Vitals Entered By: Mervin Kung CMA (May 20, 2009 1:21 PM) CC: room 4  Pt states she is still having severe left leg pain. Pain meds (Tylenol) is not helping. Is Patient Diabetic? Yes   Primary Care Provider:  Dondra Spry DO  CC:  room 4  Pt states she is still having severe left leg pain. Pain meds (Tylenol) is not helping.Marland Kitchen  History of Present Illness: Ms Brandenburger is a 65 year old female who presents today with c/o severe pain in the LLE.  She notes that the pain is "like a toothache."  She denies associated tenderness.  She remains off of Crestor due to her concern that this may be causing pain.  Patient has history of Breast Cancer (sees Dr. Myna Hidalgo).  She had an MRI of the Lumbar spine performed back in 2006 which noted disc narrowing L24-5. Also note was made of annular bulging at L4-5 as well as T11-12  and T12-L1.  Previous studies questioned vertebral lesions in the pedicles of L3 and L5 on the left.  7/06 MRI noted stability of vertebral body lesions at L1, L3 and L5.   Patient denies numbness in either of her legs, but does note some weakness in both legs.  Allergies: 1)  ! Pcn 2)  ! Aleve (Naproxen Sodium) 3)  ! Crestor 4)  Lisinopril (Lisinopril) 5)  Simvastatin (Simvastatin)  Physical Exam  General:  Morbidly obese AA female in NAD Extremities:  No swelling in LE noted, No tenderness to palpation. +LUE lymphedema Neurologic:  bilateral LE 4-5/5 strength.     Impression & Recommendations:  Problem # 1:  LEG PAIN, LEFT (ICD-729.5) Assessment Deteriorated Given patient's history of malignancy and lumbar disease as noted on last MRI, will plan to  repeat MRI of the Lumbar spine.  Will also check LE doppler to rule out DVT, pt is at increased risk for clot given history of malignancy.   Orders: Misc. Referral (Misc. Ref) Misc. Referral (Misc. Ref)  Problem # 2:  HYPERLIPIDEMIA (ICD-272.4) Assessment: Comment Only Pt remains off Crestor, does not wish to start another med "until I know what is wrong with my leg."  I did discuss the possibility of initiating Livalo with her as this is less likely to cause myalgias.  Patient will consider at a later date.   Complete Medication List: 1)  Caltrate 600+d 600-400 Mg-unit Tabs (Calcium carbonate-vitamin d) .... Take 1 tablet by mouth twice a day 2)  Vitamin C 100 Mg Tabs (Ascorbic acid) .... Take 1 tablet by mouth daily 3)  Multivitamins Tabs (Multiple vitamin) .... Take 1 tablet by mouth once a day 4)  Potassium Chloride Cr 10 Meq Cr-caps (Potassium chloride) .... Take 1 capsule by mouth once a day 5)  Claritin 10 Mg Tabs (Loratadine) .... Take 1 tablet by mouth once a day as needed 6)  Carvedilol 6.25 Mg Tabs (Carvedilol) .... Take one tablet by mouth twice a day 7)  Alpha-lipoic Acid 200 Mg Caps (Alpha-lipoic  acid) .... Take 1 capsule by mouth once a day 8)  Promethazine Hcl 25 Mg Tabs (Promethazine hcl) .... Take 1 tablet by mouth once a day as needed. 9)  Hydrochlorothiazide 25 Mg Tabs (Hydrochlorothiazide) .... Take 1 tablet by mouth once a day 10)  B Complex Tabs (B complex vitamins) .... Take 1 tablet by mouth once a day 11)  Flovent Hfa 110 Mcg/act Aero (Fluticasone propionate  hfa) .... 2 puffs twice daily 12)  Atacand 16 Mg Tabs (Candesartan cilexetil) .... Take 1 tablet by mouth once a day  Patient Instructions: 1)  Please follow up in 2 weeks, sooner if problems worsen or do not improve.   Current Allergies (reviewed today): ! PCN ! ALEVE (NAPROXEN SODIUM) ! CRESTOR LISINOPRIL (LISINOPRIL) SIMVASTATIN (SIMVASTATIN)

## 2010-02-18 NOTE — Letter (Signed)
Summary: [facility] 2006 - 2007  [facility] 2006 - 2007   Imported By: Roderic Ovens 04/08/2009 10:52:36  _____________________________________________________________________  External Attachment:    Type:   Image     Comment:   External Document

## 2010-02-18 NOTE — Progress Notes (Signed)
Summary: Hydrochlorothiazide refill  Phone Note Refill Request Call back at Home Phone [phone] Message from:  Patient on December 01, 2009 12:07 PM  Refills Requested: Medication #1:  HYDROCHLOROTHIAZIDE 25 MG TABS Take 1 tablet by mouth once a day   Dosage confirmed as above?Dosage Confirmed   Brand Name Necessary? No   Supply Requested: 1 month  Method Requested: Electronic Next Appointment Scheduled: 12/15/09 Initial call taken by: Lannette Donath,  December 01, 2009 12:08 PM    Prescriptions: HYDROCHLOROTHIAZIDE 25 MG TABS (HYDROCHLOROTHIAZIDE) Take 1 tablet by mouth once a day  #30 x 0   Entered by:   Glendell Docker CMA   Authorized by:   D. Thomos Lemons DO   Signed by:   Glendell Docker CMA on 12/01/2009   Method used:   Electronically to        [facility] (retail)       [address]       [place], [place]  [address]       Ph: [phone] or [phone]       Fax: [phone]   RxID:   [id number]

## 2010-02-18 NOTE — Assessment & Plan Note (Signed)
History of Present Illness Visit Type: Initial Consult Primary GI MD: Rob Bunting MD Primary Provider: Dondra Spry DO Requesting Provider: D Thomos Lemons, DO Chief Complaint: GERD History of Present Illness:     very pleasant 65 year old woman who has nausea, forever, at least many years.  Takes phenergan about once a month.  Eating will usually make it happen.   She has pyrosis occaisionally, lower esophagus.  Rare vomiting.  Occasionally food transit is slow, hanging a bit.  She was on Nexium in the past, didn't really notice a difference but had abd pains, rebound when she stopped.  Was in ER recently at [facility], with vomiting.    had colonoscopy at Dr. Nedra Hai in [facility], she had other one  prior to that.  Had upper endoscopy in the past (2005 or so) she believes it was normal.    has not had an ultrasound  she takes advil 3-4 pills a week.          Denies anal fissure, black tarry stools, change in bowel habit, constipation, diarrhea, diverticulosis, fecal incontinence, heme positive stool, hemorrhoids, irritable bowel syndrome, jaundice, light color stool, liver problems, rectal bleeding, and  rectal pain.    Current Medications (verified): 1)  Vitamin C 500 Mg Tabs (Ascorbic Acid) .... Take 1 Tablet By Mouth Once A Day 2)  Multivitamins  Tabs (Multiple Vitamin) .... Take 1 Tablet By Mouth Once A Day 3)  Potassium Chloride Cr 10 Meq Cr-Caps (Potassium Chloride) .... Take 1 Capsule By Mouth Once A Day 4)  Claritin 10 Mg Tabs (Loratadine) .... Take 1 Tablet By Mouth Once A Day As Needed 5)  Carvedilol 6.25 Mg Tabs (Carvedilol) .... Take One Tablet By Mouth Twice A Day 6)  Alpha-Lipoic Acid 200 Mg Caps (Alpha-Lipoic Acid) .... Take 1 Capsule By Mouth Once A Day 7)  Promethazine Hcl 25 Mg Tabs (Promethazine Hcl) .... Take 1 Tablet By Mouth Once A Day As Needed. 8)  Hydrochlorothiazide 25 Mg Tabs (Hydrochlorothiazide) .... Take 1 Tablet By Mouth Once A Day 9)  B Complex  Tabs (B  Complex Vitamins) .... Take 1 Tablet By Mouth Once A Day 10)  Flovent Hfa 110 Mcg/act Aero (Fluticasone Propionate  Hfa) .... 2 Puffs Twice Daily 11)  Atacand 16 Mg Tabs (Candesartan Cilexetil) .... Take 1 Tablet By Mouth Once A Day 12)  Magnesium 250 Mg Tabs (Magnesium) .... Take 1 Tablet By Mouth Once A Day 13)  Vitamin D3 2000 Unit Tabs (Cholecalciferol) .... One Tablet By Mouth Every Other Day  Allergies (verified): 1)  ! Pcn 2)  ! Aleve (Naproxen Sodium) 3)  Lisinopril (Lisinopril) 4)  Simvastatin (Simvastatin)  Past History:  Past Medical History: History of nonischemic cardiomyopathy improved by most recent echocardiogram. Left bundle-branch block   Breast cancer, hx of (diagnosed 2009) Colonic polyps, hx of (unclear pathology) Hyperlipidemia Hypertension  Osteoarthritis - Left Knee pain    MRI in 2008 - tricompartmental degenerate changes,  mucoid degeneration of ACL   post horn meniscal tear and ant horn meniscal tear Hx of low back pain -  Left L3-4 injected  Past Surgical History: Appendectomy  2009 - Dr. Ezzard Standing CTS surgery 2007 - Dr. Teressa Senter Hysterectomy Lumpectomy  Tonsillectomy  Arthroscopic surgery R Knee     Family History: Family History of CAD Female 1st degree relative <60 Family History High cholesterol Family History Hypertension Family History of Prostate CA 1st degree relative <50 Brother and sister have diabetes Mother died of  MI at at 10 Sister with pacemaker   no colon cancer  Social History: Widow/Widower - husband died 06-04-2003 Occupation - retired from working in [employer]  2 daughter - one lives in [place], other in [place] Remote history of tobacco but none in 40 years  Alcohol use-no     Review of Systems       Pertinent positive and negative review of systems were noted in the above HPI and GI specific review of systems.  All other review of systems was otherwise negative.   Vital Signs:  Patient profile:   65 year old  female Height:      65.5 inches Weight:      236 pounds BMI:     38.81 Pulse rate:   92 / minute Pulse rhythm:   regular BP sitting:   108 / 60  (right arm) Cuff size:   large  Vitals Entered By: June McMurray CMA Duncan Dull) (September 30, 2009 1:07 PM)  Physical Exam  Additional Exam:  Constitutional: Obese otherwise generally well appearing Psychiatric: alert and oriented times 3 Eyes: extraocular movements intact Mouth: oropharynx moist, no lesions Neck: supple, no lymphadenopathy Cardiovascular: heart regular rate and rythm Lungs: CTA bilaterally Abdomen: soft, non-tender, non-distended, no obvious ascites, no peritoneal signs, normal bowel sounds Extremities: no lower extremity edema bilaterally Skin: no lesions on visible extremities    Impression & Recommendations:  Problem # 1:  nausea she has postprandial nausea sometimes with vomiting. This is not affecting her weight. She only requires antinausea medicines about once a month. She has had previous upper endoscopies and we will get those results sent for from her [place] gastro[place]ologist. We will also get her colonoscopy records as well. This may be GERD related however she says being on proton pump inhibitors has not really helped. Perhaps his biliary in nature. She is on potassium and the #1 side effect of that is nausea. For now we'll get records from her previous gastroenterologist, she will get a basic set of labs including a CBC, complete metabolic profile, and I will set her up with an ultrasound to check for gallstones. If this workup is unrevealing then I may need to proceed with upper endoscopy.  Other Orders: TLB-CBC Platelet - w/Differential (85025-[id number]) TLB-CMP (Comprehensive Metabolic Pnl) ([id number])  Patient Instructions: 1)  You will be scheduled for an ultrasound to check for gallstones. 2)  You will get lab test(s) done today (cbc, cmet). 3)  We will get records from Dr. Nedra Hai, [facility]);  colonoscopy, EGD reports, biopsy results. 4)  A copy of this information will be sent to Dr. Artist Pais. 5)  The medication list was reviewed and reconciled.  All changed / newly prescribed medications were explained.  A complete medication list was provided to the patient / caregiver.  Appended Document: Orders Update/US    Clinical Lists Changes  Orders: Added new Test order of Ultrasound Abdomen (UAS) - Signed

## 2010-02-18 NOTE — Assessment & Plan Note (Signed)
Summary: new to bes est /mhf   Vital Signs:  Patient profile:   65 year old female Height:      65.5 inches Weight:      240.50 pounds BMI:     39.56 O2 Sat:      97 % on Room air Temp:     98.3 degrees F oral Pulse rate:   83 / minute Pulse rhythm:   regular Resp:     18 per minute BP sitting:   104 / 60  (right arm) Cuff size:   large  Vitals Entered By: Glendell Docker CMA (March 11, 2009 9:35 AM)  O2 Flow:  Room air CC: Rm 2-New Patient to establish care Is Patient Diabetic? No Comments New Patient , no previous pcp, seen in clinic at [facility] establish care, non fasting, scheduled for mammogram and bone density  Arpil 11th   Primary Care Provider:  Dondra Spry DO  CC:  Rm 2-New Patient to establish care.  History of Present Illness: 66 y/o female with hx of breast ca, htn,  and abnormal glucose to establish.    she was formerly followed by Zoe Lan NP  she was diagnosed with htn 3-4 yrs ago.   she was initially on benicar but discontinued due to dizziness.  she has never been on ACE.  she also has hx of borderline diabetes - followed q 3 months.  she trys her best with dietary restriction.   she has never taken DM meds  medical records reviewed  Preventive Screening-Counseling & Management  Alcohol-Tobacco     Alcohol drinks/day: 0     Smoking Status: never     Packs/Day: <0.25     Year Started: 1963     Year Quit: 06/11/62  Caffeine-Diet-Exercise     Caffeine use/day: 2 per month      Does Patient Exercise: no  Allergies (verified): 1)  ! Pcn 2)  ! Aleve (Naproxen Sodium)  Past History:  Past Medical History: Atrial fibrillation Breast cancer, hx of Colonic polyps, hx of Hyperlipidemia Hypertension  Osteoarthritis - Left Knee pain    MRI in 06/11/2006 - tricompartmental degenerate changes,  mucoid degeneration of ACL   post horn meniscal tear and ant horn meniscal tear Hx of low back pain -  Left L3-4 injected  Past Surgical  History: Appendectomy  2007/06/11 - Dr. Ezzard Standing CTS surgery 06/10/05 - Dr. 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    Physical Exam  General:  alert and overweight-appearing.   Head:  normocephalic and atraumatic.   Eyes:  pupils equal, pupils round, and pupils reactive to light.   Ears:  R ear normal and L ear normal.   Mouth:  Oral mucosa and oropharynx without lesions or exudates.   Neck:  thick,  supple and no masses.   Lungs:  normal respiratory effort, normal breath sounds, no crackles, and no wheezes.   Heart:  normal rate, regular rhythm, and no gallop.  SEM LSB 2/6 Abdomen:  obese, NT, no masses, no hepatomegaly, and no splenomegaly.   Pulses:  dorsalis pedis and posterior tibial pulses are full and equal bilaterally Extremities:  trace left pedal edema and trace right pedal edema.   Neurologic:  cranial nerves II-XII intact and gait normal.   Psych:  normally interactive, good eye contact, not anxious appearing, and not depressed appearing.     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 Hollice Gong at [facility] s/p injections but not surgery last cortisone injection 1 yr ago  Problem #  2:  HYPERTENSION (ICD-401.9)  diagnosed 3-4 yrs ago.  prev on benicar but it made her feel dizzy. cardiac cath 2009-2010 Dr. Linton Ham ([facility])    The following medications were removed from the medication list:    Atacand 16 Mg Tabs (Candesartan cilexetil) .Marland Kitchen... Take 1 tablet by mouth once a day    Hydrochlorothiazide 25 Mg Tabs (Hydrochlorothiazide) .Marland Kitchen... Take 1 tablet by mouth once a day Her updated medication list for this problem includes:    Coreg 3.125 Mg Tabs (Carvedilol) .Marland Kitchen... Take 1 tablet by mouth two times a day    Lisinopril-hydrochlorothiazide 20-25 Mg Tabs (Lisinopril-hydrochlorothiazide) ..... One by mouth once daily  BP today: 104/60  Orders: T-Basic Metabolic Panel [id number])  Problem # 3:  HYPERLIPIDEMIA (ICD-272.4)  prev took zocor.  stopped due to leg pain and headaches.  The following medications were removed from the medication list:    Zetia 10 Mg Tabs (Ezetimibe) .Marland Kitchen... Take 1 tablet by mouth once a day  Orders: T-Hepatic Function [phone]) T-Lipid Profile [id number]) T-TSH [id number])  Problem # 4:  BREAST CANCER, HX OF (ICD-V10.3) Followed by Dr. Myna Hidalgo.   Breast cancer diagnosed in 1999 Left breast.  s/p lumpectomy,  chemo and radiation ajduvant tamoxifen, arimidex , then on femara  stopped 10/2008  scheduled for bone density  Complete Medication List: 1)  Caltrate 600+d 600-400 Mg-unit Tabs (Calcium carbonate-vitamin d) .... Take 1 tablet by mouth once a day 2)  Vitamin C 100 Mg Tabs (Ascorbic acid) .... Take 1 tablet by mouth two times a day 3)  Multivitamins Tabs (Multiple vitamin) .... Take 1 tablet by mouth once a day 4)  Potassium Chloride Cr 10 Meq Cr-caps (Potassium chloride) .... Take 1 capsule by mouth once a day 5)  Claritin 10 Mg Tabs (Loratadine) .... Take 1 tablet by mouth once a day as needed 6)  Coreg 3.125 Mg Tabs (Carvedilol) .... Take 1 tablet by mouth two times a day 7)  Alpha-lipoic Acid 200 Mg Caps  (Alpha-lipoic acid) .... Take 1 capsule by mouth once a day 8)  Lisinopril-hydrochlorothiazide 20-25 Mg Tabs (Lisinopril-hydrochlorothiazide) .... One by mouth once daily  Other Orders: T- Hemoglobin A1C ([id number])  Patient Instructions: 1)  Please schedule a follow-up appointment in 1 month. Prescriptions: LISINOPRIL-HYDROCHLOROTHIAZIDE 20-25 MG TABS (LISINOPRIL-HYDROCHLOROTHIAZIDE) one by mouth once daily  #30 x 3   Entered and Authorized by:   D. Thomos Lemons DO   Signed by:   D. Thomos Lemons DO on 03/11/2009   Method used:   Electronically to        [facility] (retail)       [address]       [place]  [place], [place]  [address]       Ph: [phone] or [phone]       Fax: [phone]   RxID:   [id number]   Current Allergies (reviewed today): ! PCN ! ALEVE (NAPROXEN SODIUM)   Immunization History:  Influenza Immunization History:    Influenza:  declined (03/11/2009)   Contraindications/Deferment of Procedures/Staging:    Test/Procedure: FLU VAX    Reason for deferment: patient declined     Test/Procedure: PAP Smear    Reason for deferment: hysterectomy     Preventive Care Screening  Mammogram:    Date:  04/09/2008    Results:  normal

## 2010-02-18 NOTE — Progress Notes (Signed)
Summary: side effects  Phone Note Call from Patient   Caller: Patient Call For: Brenda Craze, NP Summary of Call: Pt states that she feels very weak since starting the Diovan 160mg  1qd. Says she doesn't feel like getting out of bed.  She is also starting to itch on her legs, arms and chest.  Please advise.  Mervin Kung CMA  April 29, 2009 8:30 AM   Follow-up for Phone Call        She should hold diovan and zithromax and be scheduled for follow up visit today pls Follow-up by: Lemont Fillers FNP,  April 29, 2009 8:58 AM  Additional Follow-up for Phone Call Additional follow up Details #1::        Advised pt. per Melissa's instructions.  Follow up scheduled for today @ 10:45 with Melissa.  Mervin Kung CMA  April 29, 2009 9:13 AM

## 2010-02-18 NOTE — Consult Note (Signed)
Summary: [facility]  [facility]   Imported By: Lanelle Bal 07/09/2009 10:55:35  _____________________________________________________________________  External Attachment:    Type:   Image     Comment:   External Document

## 2010-02-18 NOTE — Progress Notes (Signed)
Summary: Atacand Refill  Phone Note Refill Request Message from:  Fax from Pharmacy on December 24, 2009 8:02 AM  Refills Requested: Medication #1:  ATACAND 16 MG TABS Take 1 tablet by mouth once a day   Dosage confirmed as above?Dosage Confirmed   Brand Name Necessary? No   Supply Requested: 1 month   Last Refilled: 09/18/2009 [facility] [address] [place] FAX [phone]   Method Requested: Electronic Next Appointment Scheduled: 03-30-10 DR Artist Pais  Initial call taken by: Roselle Locus,  December 24, 2009 8:02 AM  Follow-up for Phone Call        Rx completed in Dr. Tiajuana Amass Follow-up by: Glendell Docker CMA,  December 24, 2009 8:28 AM    Prescriptions: ATACAND 16 MG TABS (CANDESARTAN CILEXETIL) Take 1 tablet by mouth once a day  #30 x 3   Entered by:   Glendell Docker CMA   Authorized by:   D. Thomos Lemons DO   Signed by:   Glendell Docker CMA on 12/24/2009   Method used:   Electronically to        [facility] (retail)       [address]       [place], [place]  [address]       Ph: [phone] or [phone]       Fax: [phone]   RxID:   [id number]

## 2010-02-18 NOTE — Assessment & Plan Note (Signed)
Summary: pain in legs/dt   Vital Signs:  Patient profile:   65 year old female Height:      65.5 inches Weight:      238.50 pounds BMI:     39.23 Temp:     98.0 degrees F oral Pulse rate:   96 / minute Pulse rhythm:   regular Resp:     18 per minute BP sitting:   120 / 70  (right arm) Cuff size:   large  Vitals Entered By: Mervin Kung CMA (May 07, 2009 8:29 AM) CC: room 3  Pt states the pain in her legs has gotten a little better but still has episodes of shooting pains like a toothache. Is Patient Diabetic? Yes Comments Pt states she has been taking the Diovan as Dr. Artist Pais told her we were going to start the prior auth process for the Atacand and pt. did not want to start something new in the process.   Primary Care Provider:  Dondra Spry DO  CC:  room 3  Pt states the pain in her legs has gotten a little better but still has episodes of shooting pains like a toothache..  History of Present Illness: Brenda Marsh is a 65 year old female who presents today with c/o bilateral leg muscle aches.  Notes that pain occurs at rest and is not exacerbated by activity.  She notes that the pain is improving since last visit with decreased frequency.  She describes the pain as throbbing, "like a toothache."  She reports that she discontinued the crestor 10 days ago.   Last visit she was treated for a UTI with levaquin and underwent a negative CT abdomen.  She reports resolution of the back pain.    Allergies: 1)  ! Pcn 2)  ! Aleve (Naproxen Sodium) 3)  ! Crestor 4)  Lisinopril (Lisinopril) 5)  Simvastatin (Simvastatin)  Physical Exam  General:  Well-developed,well-nourished,in no acute distress; alert,appropriate and cooperative throughout examination Lungs:  Normal respiratory effort, chest expands symmetrically. Lungs are clear to auscultation, no crackles or wheezes. Heart:  Normal rate and regular rhythm. S1 and S2 normal without gallop, murmur, click, rub or other extra  sounds. Pulses:  R posterior tibial normal, R dorsalis pedis normal, L posterior tibial normal, and L dorsalis pedis normal.   Extremities:  +lymphedema LUE, no LE edema   Impression & Recommendations:  Problem # 1:  UTI (ICD-599.0) Assessment Improved Urinary symptoms and back pain have resolved. The following medications were removed from the medication list:    Zithromax Z-pak 250 Mg Tabs (Azithromycin) .Marland Kitchen... 2 tabs by mouth x 1 today, then one tablet by mouth daily for 4 more days    Levaquin 500 Mg Tabs (Levofloxacin) ..... One tablet by mouth daily x 7 days  Problem # 2:  LEG PAIN, BILATERAL (ICD-729.5) Assessment: Improved Pt reports this pain is improved since last visit.  She has good peripheral pulses and pain is not worsened by walking. Pain sounds muscular in nature.  She remains off of crestor.  Can consider trial of Livalo when legs feeling better.  I will discuss with Dr. Artist Pais.    Complete Medication List: 1)  Caltrate 600+d 600-400 Mg-unit Tabs (Calcium carbonate-vitamin d) .... Take 1 tablet by mouth twice a day 2)  Vitamin C 100 Mg Tabs (Ascorbic acid) .... Take 1 tablet by mouth daily 3)  Multivitamins Tabs (Multiple vitamin) .... Take 1 tablet by mouth once a day 4)  Potassium Chloride Cr  10 Meq Cr-caps (Potassium chloride) .... Take 1 capsule by mouth once a day 5)  Claritin 10 Mg Tabs (Loratadine) .... Take 1 tablet by mouth once a day as needed 6)  Carvedilol 6.25 Mg Tabs (Carvedilol) .... Take one tablet by mouth twice a day 7)  Alpha-lipoic Acid 200 Mg Caps (Alpha-lipoic acid) .... Take 1 capsule by mouth once a day 8)  Promethazine Hcl 25 Mg Tabs (Promethazine hcl) .... Take 1 tablet by mouth once a day as needed. 9)  Hydrochlorothiazide 25 Mg Tabs (Hydrochlorothiazide) .... Take 1 tablet by mouth once a day 10)  B Complex Tabs (B complex vitamins) .... Take 1 tablet by mouth once a day 11)  Flovent Hfa 110 Mcg/act Aero (Fluticasone propionate  hfa) .... 2 puffs  twice daily 12)  Diovan 160 Mg Tabs (Valsartan) .... Take 1 tablet by mouth once a day  Patient Instructions: 1)  Please keep your appointment in June with Dr. Artist Pais.  Follow up sooner if the pain in your legs worsens or does not improve.    Current Allergies (reviewed today): ! PCN ! ALEVE (NAPROXEN SODIUM) ! CRESTOR LISINOPRIL (LISINOPRIL) SIMVASTATIN (SIMVASTATIN)

## 2010-02-18 NOTE — Letter (Signed)
Summary: New Patient letter  [facility]  [address] [place], [place] [address]   Phone: [phone]  Fax: [phone]       07/03/2009 MRN: [id number]  Brenda Marsh [address] [place], [place]  [address]  Dear Ms. Nou,  Welcome to the Gastroenterology Division at [facility].    You are scheduled to see Dr.  Rob Bunting on August 12, 2009 at 8:30am on the 3rd floor at [facility], [address]  We ask that you try to arrive at our office 15 minutes prior to your appointment time to allow for check-in.  We would like you to complete the enclosed self-administered evaluation form prior to your visit and bring it with you on the day of your appointment.  We will review it with you.  Also, please bring a complete list of all your medications or, if you prefer, bring the medication bottles and we will list them.  Please bring your insurance card so that we may make a copy of it.  If your insurance requires a referral to see a specialist, please bring your referral form from your primary care physician.  Co-payments are due at the time of your visit and may be paid by cash, check or credit card.     Your office visit will consist of a consult with your physician (includes a physical exam), any laboratory testing he/she may order, scheduling of any necessary diagnostic testing (e.g. x-ray, ultrasound, CT-scan), and scheduling of a procedure (e.g. Endoscopy, Colonoscopy) if required.  Please allow enough time on your schedule to allow for any/all of these possibilities.    If you cannot keep your appointment, please call [phone] to cancel or reschedule prior to your appointment date.  This allows [place] the opportunity to schedule an appointment for another patient in need of care.  If you do not cancel or reschedule by 5 p.m. the business day prior to your appointment date, you will be charged a $50.00 late cancellation/no-show fee.    Thank  you for choosing [facility] for your medical needs.  We appreciate the opportunity to care for you.  Please visit [place] at our website  to learn more about our practice.                     Sincerely,                                                             The Gastroenterology Division

## 2010-02-18 NOTE — Medication Information (Signed)
Summary: Prior Authorization for Atacand/BSBSNC  Prior Authorization for Atacand/BSBSNC   Imported By: Lanelle Bal 05/13/2009 11:09:38  _____________________________________________________________________  External Attachment:    Type:   Image     Comment:   External Document

## 2010-02-18 NOTE — Assessment & Plan Note (Signed)
Summary: [facility]   Visit Type:  3 months follow up Primary Provider:  Dondra Spry DO  CC:  No complains.  History of Present Illness: Pleasant female with history of dilated cardiomyopathy for F/U. Patient has a history of left bundle branch block. She underwent cardiac catheterization in [facility] in March of 2010 for reduced LV function. Ejection fraction was 35%. The left main was normal. The LAD had a proximal 25-30% lesion. The circumflex was normal. The right coronary artery had a 15% lesion. Patient was treated with medications. Her most recent echocardiogram in March of 2011 revealed improvement in her LV function. Ejection fraction was 50%.  She also has diastolic dysfunction. She also has a history of chest pain.  I last saw her in March of 2011. Since then the patient has dyspnea with more extreme activities but not with routine activities. It is relieved with rest. It is not associated with chest pain. There is no orthopnea, PND or pedal edema. There is no syncope or palpitations. There is no exertional chest pain.   Current Medications (verified): 1)  Calcium-D 600-200 Mg-Unit Tabs (Calcium Carbonate-Vitamin D) .... Take 2 Tablets By Mouth Once Daily 2)  Vitamin C 500 Mg Tabs (Ascorbic Acid) .... Take 1 Tablet By Mouth Once A Day 3)  Multivitamins  Tabs (Multiple Vitamin) .... Take 1 Tablet By Mouth Once A Day 4)  Potassium Chloride Cr 10 Meq Cr-Caps (Potassium Chloride) .... Take 1 Capsule By Mouth Once A Day 5)  Claritin 10 Mg Tabs (Loratadine) .... Take 1 Tablet By Mouth Once A Day As Needed 6)  Carvedilol 6.25 Mg Tabs (Carvedilol) .... Take One Tablet By Mouth Twice A Day 7)  Alpha-Lipoic Acid 200 Mg Caps (Alpha-Lipoic Acid) .... Take 1 Capsule By Mouth Once A Day 8)  Promethazine Hcl 25 Mg Tabs (Promethazine Hcl) .... Take 1 Tablet By Mouth Once A Day As Needed. 9)  Hydrochlorothiazide 25 Mg Tabs (Hydrochlorothiazide) .... Take 1 Tablet By Mouth Once A Day 10)  B  Complex  Tabs (B Complex Vitamins) .... Take 1 Tablet By Mouth Once A Day 11)  Flovent Hfa 110 Mcg/act Aero (Fluticasone Propionate  Hfa) .... 2 Puffs Twice Daily 12)  Atacand 16 Mg Tabs (Candesartan Cilexetil) .... Take 1 Tablet By Mouth Once A Day 13)  Magnesium 250 Mg Tabs (Magnesium) .... Take 1 Tablet By Mouth Once A Day 14)  Vitamin D3 2000 Unit Tabs (Cholecalciferol) .... One Tablet By Mouth Every Other Day 15)  Omeprazole 20 Mg Cpdr (Omeprazole) .... One By Mouth Qam 15 Mins Before Am Meal  Allergies: 1)  ! Pcn 2)  ! Aleve (Naproxen Sodium) 3)  Lisinopril (Lisinopril) 4)  Simvastatin (Simvastatin)  Past History:  Past Medical History: Reviewed history from 03/19/2009 and no changes required. History of nonischemic cardiomyopathy improved by most recent echocardiogram. Left bundle-branch block Breast cancer, hx of (diagnosed 2007-06-19) Colonic polyps, hx of Hyperlipidemia Hypertension  Osteoarthritis - Left Knee pain    MRI in 06-19-06 - tricompartmental degenerate changes,  mucoid degeneration of ACL   post horn meniscal tear and ant horn meniscal tear Hx of low back pain -  Left L3-4 injected  Past Surgical History: Reviewed history from 03/19/2009 and no changes required. Appendectomy  06/19/07 - Dr. Ezzard Standing CTS surgery June 18, 2005 - Dr. Teressa Senter Hysterectomy Lumpectomy  Tonsillectomy Arthroscopic surgery R Knee  Social History: Reviewed history from 03/19/2009 and no changes required. Widow/Widower - husband died June 19, 2003 Occupation - retired from  working in [employer] 2 daughter - one lives in [place], other in [place] Remote history of tobacco but none in 40 years Alcohol use-no  Review of Systems       Some pain LLE and Right wrist but no fevers or chills, productive cough, hemoptysis, dysphasia, odynophagia, melena, hematochezia, dysuria, hematuria, rash, seizure activity, orthopnea, PND, pedal edema, claudication. Remaining systems are negative.   Vital Signs:  Patient profile:    65 year old female Height:      65.5 inches Weight:      232.50 pounds BMI:     38.24 Pulse rate:   76 / minute Pulse rhythm:   regular Resp:     18 per minute BP sitting:   106 / 60  (right arm) Cuff size:   large  Vitals Entered By: Vikki Ports (July 02, 2009 10:05 AM)  Physical Exam  General:  Well-developed well-nourished in no acute distress.  Skin is warm and dry.  HEENT is normal.  Neck is supple. No thyromegaly.  Chest is clear to auscultation with normal expansion.  Cardiovascular exam is regular rate and rhythm.  Abdominal exam nontender or distended. No masses palpated. Extremities show lymphedema LUE. neuro grossly intact    EKG  Procedure date:  07/02/2009  Findings:      Normal sinus rhythm at a rate of 76. Left bundle branch block.  Impression & Recommendations:  Problem # 1:  CARDIOMYOPATHY (ICD-425.4) Ejection fraction now 50%. No CHF symptoms. Continue ARB and beta blocker. Her updated medication list for this problem includes:    Carvedilol 6.25 Mg Tabs (Carvedilol) .Marland Kitchen... Take one tablet by mouth twice a day    Hydrochlorothiazide 25 Mg Tabs (Hydrochlorothiazide) .Marland Kitchen... Take 1 tablet by mouth once a day    Atacand 16 Mg Tabs (Candesartan cilexetil) .Marland Kitchen... Take 1 tablet by mouth once a day  Problem # 2:  DIABETES MELLITUS, TYPE II, BORDERLINE (ICD-790.29) Management per primary care.  Problem # 3:  HYPERTENSION (ICD-401.9) Blood pressure controlled on present medications. Will continue. Her updated medication list for this problem includes:    Carvedilol 6.25 Mg Tabs (Carvedilol) .Marland Kitchen... Take one tablet by mouth twice a day    Hydrochlorothiazide 25 Mg Tabs (Hydrochlorothiazide) .Marland Kitchen... Take 1 tablet by mouth once a day    Atacand 16 Mg Tabs (Candesartan cilexetil) .Marland Kitchen... Take 1 tablet by mouth once a day  Problem # 4:  HYPERLIPIDEMIA (ICD-272.4) Management per primary care.  Problem # 5:  BREAST CANCER, HX OF (ICD-V10.3)  Problem # 6:  GERD  (ICD-530.81)  Her updated medication list for this problem includes:    Omeprazole 20 Mg Cpdr (Omeprazole) ..... One by mouth qam 15 mins before am meal  Patient Instructions: 1)  Your physician recommends that you schedule a follow-up appointment in: ONE YEAR

## 2010-02-18 NOTE — Assessment & Plan Note (Signed)
Summary: MEDICATION REACTION/DK   Vital Signs:  Patient profile:   65 year old female Weight:      242.50 pounds BMI:     39.88 O2 Sat:      97 % on Room air Temp:     97.8 degrees F oral Pulse rate:   87 / minute Pulse rhythm:   regular Resp:     16 per minute BP sitting:   120 / 70  (right arm) Cuff size:   large  Vitals Entered By: Glendell Docker CMA (March 19, 2009 8:29 AM)  O2 Flow:  Room air CC: Follow up on medication Comments c/o chest , stomach, cough, blood in stool , thinks that it occurred due to the medication. She started Lisinorpirl HCT on Tuesday and felt nauseated and onset of symptoms started on Saturday of the same week   Primary Care Provider:  D. Thomos Lemons DO  CC:  Follow up on medication.  History of Present Illness: 65 y/o AA female for f/u.  soon after taking lisinopril on Tuesday of last week, she reports epigastric pain, felt weak, and cough Stopped lisinopril on saturday.  feeling better but not 100%. she has been taking atacand  and hctz. Benicar made her feel dizzy She occ feels cold sensation. she is not sure whether syptoms could be related to BP  we reviewed lab results.  cholesteroal elevated.  she tried zocor and lipitor in the past.  it caused muscle aches.  she was started on zetia but she report little effect on cholesterol  A1c elevated.   Allergies: 1)  ! Pcn 2)  ! Aleve (Naproxen Sodium) 3)  Lisinopril (Lisinopril) 4)  Simvastatin (Simvastatin)  Past History:  Past Medical History:  Breast cancer, hx of Colonic polyps, hx of Hyperlipidemia Hypertension   Osteoarthritis - Left Knee pain    MRI in 06-04-2006 - tricompartmental degenerate changes,  mucoid degeneration of ACL   post horn meniscal tear and ant horn meniscal tear Hx of low back pain -  Left L3-4 injected Obesity - abnormal glucose  Past Surgical History: Appendectomy  2007/06/04 - Dr. Ezzard Standing CTS surgery 2005-06-03 - Dr. 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   Lungs:  normal respiratory effort and normal breath sounds.   Heart:  normal rate, regular rhythm, and no gallop.   Extremities:  trace left pedal edema and trace right pedal edema.     Impression & Recommendations:  Problem # 1:  HYPERTENSION (ICD-401.9) lisinopril caused weakness and cough.  she prefers to stay on atacand.  Pt advised to take 1/2 dose of HCTZ.  "cold sensation" may be related to relative hypotension  The following medications were removed from the medication list:    Lisinopril-hydrochlorothiazide 20-25 Mg Tabs (Lisinopril-hydrochlorothiazide) ..... One by mouth once daily Her updated medication list for this problem includes:    Coreg 3.125 Mg Tabs (Carvedilol) .Marland Kitchen... Take 1 tablet by mouth two times a day    Hydrochlorothiazide 25 Mg Tabs (Hydrochlorothiazide) .Marland Kitchen... Take 1  tablet by mouth once a day    Atacand 16 Mg Tabs (Candesartan cilexetil) .Marland Kitchen... Take 1 tablet by mouth once a day  Problem # 2:  DIABETES MELLITUS, TYPE II, BORDERLINE (ICD-790.29) Reviewed lifestyle/dietary changes.   If A1c worsens, consider add metformin.  Labs Reviewed: Creat: 0.69 (03/11/2009)     Problem # 3:  HYPERLIPIDEMIA (ICD-272.4) She has been unable to tolerate zocor or lipitor in the past due to muscle aches.  zetia not helpful in lowering cholesterol.  She has misconception that statins cause liver cancer.  I suggest she try crestor.  Her updated medication list for  this problem includes:    Crestor 10 Mg Tabs (Rosuvastatin calcium) ..... One by mouth qd  Problem # 4:  CARDIAC ARRHYTHMIA (ICD-427.9)  She notes hx of cardiac arrhythmia.  She is not sure of details but her cardiologist started pt on coreg.  she would like to establish with [facility] cardiologist.   Her updated medication list for this problem includes:    Coreg 3.125 Mg Tabs (Carvedilol) .Marland Kitchen... Take 1 tablet by mouth two times a day  Orders: Cardiology Referral (Cardiology)  Complete Medication List: 1)  Caltrate 600+d 600-400 Mg-unit Tabs (Calcium carbonate-vitamin d) .... Take 1 tablet by mouth once a day 2)  Vitamin C 100 Mg Tabs (Ascorbic acid) .... Take 1 tablet by mouth two times a day 3)  Multivitamins Tabs (Multiple vitamin) .... Take 1 tablet by mouth once a day 4)  Potassium Chloride Cr 10 Meq Cr-caps (Potassium chloride) .... Take 1 capsule by mouth once a day 5)  Claritin 10 Mg Tabs (Loratadine) .... Take 1 tablet by mouth once a day as needed 6)  Coreg 3.125 Mg Tabs (Carvedilol) .... Take 1 tablet by mouth two times a day 7)  Alpha-lipoic Acid 200 Mg Caps (Alpha-lipoic acid) .... Take 1 capsule by mouth once a day 8)  Promethazine Hcl 25 Mg Tabs (Promethazine hcl) .... Take 1 tablet by mouth once a day 9)  Hydrochlorothiazide 25 Mg Tabs (Hydrochlorothiazide) .... Take 1 tablet by mouth once a day 10)  Atacand 16 Mg Tabs (Candesartan cilexetil) .... Take 1 tablet by mouth once a day 11)  B Complex Tabs (B complex vitamins) .... Take 1 tablet by mouth once a day 12)  Crestor 10 Mg Tabs (Rosuvastatin calcium) .... One by mouth qd  Patient Instructions: 1)  Please schedule a follow-up appointment in 3 months. 2)  BMP prior to visit, ICD-9:  401.9 3)  Lipid Panel, AST, ALT prior to visit, ICD-9: 272.4 4)  HbgA1C prior to visit, ICD-9: 790.29 5)  Please return for lab work one (1) week before your next appointment.  Prescriptions: ATACAND 16 MG TABS (CANDESARTAN CILEXETIL)  Take 1 tablet by mouth once a day  #30 x 5   Entered and Authorized by:   D. Thomos Lemons DO   Signed by:   D. Thomos Lemons DO on 03/19/2009   Method used:   Electronically to        [facility] (retail)       [address]       [place], [place]  [address]       Ph: [phone] or [phone]       Fax: [phone]   RxID:   [id number] CRESTOR 10 MG TABS (ROSUVASTATIN CALCIUM) one by mouth qd  #30 x 3   Entered and Authorized by:  Dondra Spry DO   Signed by:   D. Thomos Lemons DO on 03/19/2009   Method used:   Electronically to        [facility] (retail)       [address]       [place], [place]  [address]       Ph: [phone] or [phone]       Fax: [phone]   RxID:   [id number]   Current Allergies (reviewed today): ! PCN ! ALEVE (NAPROXEN SODIUM) LISINOPRIL (LISINOPRIL) SIMVASTATIN (SIMVASTATIN)

## 2010-02-18 NOTE — Letter (Signed)
Summary: [facility] Assessment/[employer]  Health Assessment/[employer]   Imported By: Lanelle Bal 09/12/2009 11:02:15  _____________________________________________________________________  External Attachment:    Type:   Image     Comment:   External Document

## 2010-02-18 NOTE — Progress Notes (Signed)
Summary: Right Leg Pain  Phone Note Call from Patient Call back at Home Phone [phone]   Caller: Patient Call For: D. Thomos Lemons DO Summary of Call: patient called and left voice message stating she is havign right leg pain Initial call taken by: Glendell Docker CMA,  May 20, 2009 12:45 PM  Follow-up for Phone Call        Pt made an appt. and was seen by Sandford Craze, NP today at 1:15.  Mervin Kung CMA  May 20, 2009 3:53 PM

## 2010-02-18 NOTE — Progress Notes (Signed)
Summary: ADVERSE REACTION TO LISINOPRIL?  Phone Note Call from Patient Call back at Home Phone [phone]   Caller: Patient Call For: D. Thomos Lemons DO Summary of Call: DR Artist Pais PUT HER ON LISINOPRIL AND SHE SAYS IT DOES NOT AGREE WITH HER.  SATURDAY SHE HAD STOMACH PAINS CHEST PAINS AND WAS COUGHING.  SHE DID NOT TAKE ONE YESTERDAY AND TODAY.  SHE ALSO SAW BLOOD IN HER STOOL.  PLEASE ADVISE WHAT TO DO  Initial call taken by: Roselle Locus,  March 17, 2009 8:59 AM  Follow-up for Phone Call        She should stop lisinopril.  Patient should go to ER given rectal bleeding and chest pain.  Needs f/u with Dr. Artist Pais when he returns. Follow-up by: Lemont Fillers FNP,  March 17, 2009 11:46 AM  Additional Follow-up for Phone Call Additional follow up Details #1::        patient advised per Sandford Craze instructions. Patient declines to go to the  ER for evaluation.She states her chest pains are not as bad as they were on Saturday and chooses to wait until Dr Artist Pais returns  Additional Follow-up by: Glendell Docker CMA,  March 17, 2009 11:51 AM

## 2010-02-18 NOTE — Progress Notes (Signed)
  Phone Note Outgoing Call   Summary of Call: call pt - electrolytes, kidney function and muscle enzyme blood test is normal Initial call taken by: D. Thomos Lemons DO,  April 27, 2009 7:43 AM  Follow-up for Phone Call        Sw pt adv pt electrolytes, kidney function, & muscle enzyme blood test came back normal Diane Tomerlin  April 28, 2009 10:10 AM

## 2010-02-18 NOTE — Progress Notes (Signed)
  Phone Note Outgoing Call   Summary of Call: Called patient, reviewed MRI results and plans for patient to be referred to Neurosurgery.  She is agreeable to plan. Initial call taken by: Lemont Fillers FNP,  May 21, 2009 4:31 PM

## 2010-02-18 NOTE — Medication Information (Signed)
Summary: Letter Regarding Car[facility]ilol [facility] Ast[facility]/[employer]  Letter Regarding [facility]vedilol [facility] Ast[facility]/[employer]   Imported By: Lanelle Bal 08/29/2009 12:42:28  _____________________________________________________________________  External Attachment:    Type:   Image     Comment:   External Document

## 2010-02-18 NOTE — Assessment & Plan Note (Signed)
Summary: left leg still hurts/dt   Vital Signs:  Patient profile:   65 year old female Height:      65.5 inches Weight:      233.75 pounds BMI:     38.44 O2 Sat:      96 % on Room air Temp:     98.2 degrees F oral Pulse rate:   74 / minute Resp:     18 per minute BP sitting:   130 / 70  (right arm) Cuff size:   large  Vitals Entered By: Glendell Docker CMA (December 15, 2009 10:38 AM)  O2 Flow:  Room air  Contraindications/Deferment of Procedures/Staging:    Test/Procedure: PSA    Reason for deferment: patient declined     Test/Procedure: FLU VAX    Reason for deferment: patient declined  CC: Left Leg Pain Is Patient Diabetic? No Pain Assessment Patient in pain? yes     Location: Leg Intensity: 9 Type: sharp Onset of pain  Intermittent Comments c/o sharp left leg intermittent pain, for the past 6 months, no improvement   Primary Care Provider:  Dondra Spry DO  CC:  Left Leg Pain.  History of Present Illness: interval hx - seen by Dr. Teressa Senter right wrist is feeling  after cortisone injection  persistent left pain worse with prolonged standing describes as muscle pain  htn - stable  Preventive Screening-Counseling & Management  Alcohol-Tobacco     Smoking Status: never  Allergies: 1)  ! Pcn 2)  ! Aleve (Naproxen Sodium) 3)  Lisinopril (Lisinopril) 4)  Simvastatin (Simvastatin)  Past History:  Past Medical History: History of nonischemic cardiomyopathy improved by most recent echocardiogram. Left bundle-branch block   Breast cancer, hx of (diagnosed 06/25/07) Colonic polyps, hx of (unclear pathology) Hyperlipidemia  Hypertension  Osteoarthritis - Left Knee pain    MRI in 2006-06-25 - tricompartmental degenerate changes,  mucoid degeneration of ACL   post horn meniscal tear and ant horn meniscal tear Hx of low back pain -  Left L3-4 injected  Past Surgical History: Appendectomy  25-Jun-2007 - Dr. Ezzard Standing CTS surgery 06/24/05 - Dr.  Teressa Senter Hysterectomy Lumpectomy  Tonsillectomy  Arthroscopic surgery R Knee      Family History: Family History of CAD Female 1st degree relative <60 Family History High cholesterol Family History Hypertension Family History of Prostate CA 1st degree relative <50 Brother and sister have diabetes Mother died of MI at at 52 Sister with pacemaker   no colon cancer   Social History: Widow/Widower - husband died 2003-06-25 Occupation - retired from working in [employer]  2 daughter - one lives in [place], other in [place] Remote history of tobacco but none in 40 years  Alcohol use-no       Physical Exam  General:  alert, well-developed, and well-nourished.   Lungs:  normal respiratory effort and normal breath sounds.   Heart:  normal rate, regular rhythm, and no gallop.   Msk:  left knee - no effusion ,  question bakers cyst. pain with knee flexion (back of knee)   Impression & Recommendations:  Problem # 1:  LEG PAIN, LEFT (ICD-729.5) she declines ortho referral  refer to PT for quad strengthening exercises possible patellofemoral syndrome   Orders: Physical Therapy Referral (PT)  Problem # 2:  DIABETES MELLITUS, TYPE II, BORDERLINE (ICD-790.29) Assessment: Unchanged  Orders: T-Basic Metabolic Panel [id number]) T- Hemoglobin A1C [phone])  Labs Reviewed: Creat: 0.7 (09/30/2009)     Problem # 3:  HYPERTENSION (ICD-401.9)  Assessment: Unchanged  Her updated medication list for this problem includes:    Carvedilol 6.25 Mg Tabs (Carvedilol) .Marland Kitchen... Take one tablet by mouth twice a day    Hydrochlorothiazide 25 Mg Tabs (Hydrochlorothiazide) .Marland Kitchen... Take 1 tablet by mouth once a day    Atacand 16 Mg Tabs (Candesartan cilexetil) .Marland Kitchen... Take 1 tablet by mouth once a day  BP today: 130/70 Prior BP: 108/60 (09/30/2009)  Labs Reviewed: K+: 3.6 (09/30/2009) Creat: : 0.7 (09/30/2009)   Chol: 253 (03/11/2009)   HDL: 47 (03/11/2009)   LDL: 161 (03/11/2009)   TG: 225  (03/11/2009)  Complete Medication List: 1)  Vitamin C 500 Mg Tabs (Ascorbic acid) .... Take 1 tablet by mouth once a day 2)  Multivitamins Tabs (Multiple vitamin) .... Take 1 tablet by mouth once a day 3)  Carvedilol 6.25 Mg Tabs (Carvedilol) .... Take one tablet by mouth twice a day 4)  Alpha-lipoic Acid 200 Mg Caps (Alpha-lipoic acid) .... Take 1 capsule by mouth once a day 5)  Hydrochlorothiazide 25 Mg Tabs (Hydrochlorothiazide) .... Take 1 tablet by mouth once a day 6)  B Complex Tabs (B complex vitamins) .... Take 1 tablet by mouth once a day 7)  Atacand 16 Mg Tabs (Candesartan cilexetil) .... Take 1 tablet by mouth once a day 8)  Magnesium 250 Mg Tabs (Magnesium) .... Take 1 tablet by mouth once a day 9)  Vitamin D3 2000 Unit Tabs (Cholecalciferol) .... One tablet by mouth every other day  Patient Instructions: 1)  Please schedule a follow-up appointment in 4 months.   Orders Added: 1)  T-Basic Metabolic Panel [[id number]] 2)  T- Hemoglobin A1C [[id number]] 3)  Physical Therapy Referral [PT] 4)  Est. Patient Level III [40981]   Immunization History:  Influenza Immunization History:    Influenza:  declined (12/15/2009)  Tetanus/Td Immunization History:    Tetanus/Td:  historical (09/06/2006)    Immunization History:  Influenza Immunization History:    Influenza:  Declined (12/15/2009)  Tetanus/Td Immunization History:    Tetanus/Td:  Historical (09/06/2006)   Current Allergies (reviewed today): ! PCN ! ALEVE (NAPROXEN SODIUM) LISINOPRIL (LISINOPRIL) SIMVASTATIN (SIMVASTATIN)

## 2010-02-18 NOTE — Letter (Signed)
Summary: [facility]   Imported By: Lanelle Bal 08/05/2009 09:06:59  _____________________________________________________________________  External Attachment:    Type:   Image     Comment:   External Document

## 2010-02-18 NOTE — Progress Notes (Signed)
Summary: needs authorization  Phone Note Call from Patient   Caller: patient daughter Brenda Marsh (Dr Gustavo Lah office) Call For: Brenda Marsh  Summary of Call: patient needs some samples of atacand as the prior auth has not been done.  please call daughter [phone] Initial call taken by: Roselle Locus,  April 22, 2009 1:07 PM  Follow-up for Phone Call        no samples Pt daughter called back frustrated that the authorization has not been sent, w/o it the Rx costs $400. Pls call pt's daughter Brenda Marsh at [phone], she needs the authorization done since patient is out of meds Diane Tomerlin  April 22, 2009 4:15 PM Follow-up by: D. Thomos Lemons DO,  April 22, 2009 1:10 PM  Additional Follow-up for Phone Call Additional follow up Details #1::        spoke with patient she has been advised of the change to Diovan. Samples left at front desk for patient pick up  Additional Follow-up by: Glendell Docker CMA,  April 23, 2009 1:51 PM

## 2010-02-18 NOTE — Assessment & Plan Note (Signed)
Summary: [facility]   Visit Type:  Initial Consult Primary Provider:  Dondra Spry DO  CC:  Arrythmia.  History of Present Illness: 65 year old female with history of dilated cardiomyopathy to establish. Patient has a history of left bundle branch block. She underwent cardiac catheterization in [facility] in March of 2010 for reduced LV function. Ejection fraction was 35%. The left main was normal. The LAD had a proximal 25-30% lesion. The circumflex was normal. The right coronary artery had a 15% lesion. Patient was treated with medications. Her most recent echocardiogram in March of 2010 revealed improvement in her LV function. Ejection fraction was 50-55%. There was mild mitral and tricuspid regurgitation. She also has diastolic dysfunction. She now would like to establish here. The patient does have dyspnea with more moderate activities but not with routine activities. It is relieved with rest. It is not associated with chest pain. There is no orthopnea, PND or pedal edema. She does not have syncope. She does have chest pain. She states she has had intermittent symptoms since 2005. There is a pressure in her chest when she exerts herself that is relieved with rest. It does not radiate. There is no associated symptoms. She also has a pain in her chest from taking an ACE inhibitor over the weekend by her report. It has been continuous. She also occasionally has a pain under the left breast.  Current Medications (verified): 1)  Caltrate 600+d 600-400 Mg-Unit Tabs (Calcium Carbonate-Vitamin D) .... Take 1 Tablet By Mouth Once A Day 2)  Vitamin C 100 Mg Tabs (Ascorbic Acid) .... Take 1 Tablet By Mouth Two Times A Day 3)  Multivitamins  Tabs (Multiple Vitamin) .... Take 1 Tablet By Mouth Once A Day 4)  Potassium Chloride Cr 10 Meq Cr-Caps (Potassium Chloride) .... Take 1 Capsule By Mouth Once A Day 5)  Claritin 10 Mg Tabs (Loratadine) .... Take 1 Tablet By Mouth Once A Day As Needed 6)  Coreg  3.125 Mg Tabs (Carvedilol) .... Take 1 Tablet By Mouth Two Times A Day 7)  Alpha-Lipoic Acid 200 Mg Caps (Alpha-Lipoic Acid) .... Take 1 Capsule By Mouth Once A Day 8)  Promethazine Hcl 25 Mg Tabs (Promethazine Hcl) .... Take 1 Tablet By Mouth Once A Day 9)  Hydrochlorothiazide 25 Mg Tabs (Hydrochlorothiazide) .... Take 1 Tablet By Mouth Once A Day 10)  Atacand 16 Mg Tabs (Candesartan Cilexetil) .... Take 1 Tablet By Mouth Once A Day 11)  B Complex  Tabs (B Complex Vitamins) .... Take 1 Tablet By Mouth Once A Day 12)  Crestor 10 Mg Tabs (Rosuvastatin Calcium) .... One By Mouth Qd  Allergies: 1)  ! Pcn 2)  ! Aleve (Naproxen Sodium) 3)  Lisinopril (Lisinopril) 4)  Simvastatin (Simvastatin)  Past History:  Past Medical History: History of nonischemic cardiomyopathy improved by most recent echocardiogram. Left bundle-branch block Breast cancer, hx of (diagnosed 2009) Colonic polyps, hx of Hyperlipidemia Hypertension  Osteoarthritis - Left Knee pain    MRI in 2008 - tricompartmental degenerate changes,  mucoid degeneration of ACL   post horn meniscal tear and ant horn meniscal tear Hx of low back pain -  Left L3-4 injected  Past Surgical History: Appendectomy  2009 - Dr. Ezzard Standing CTS surgery 2007 - Dr. Teressa Senter Hysterectomy Lumpectomy  Tonsillectomy Arthroscopic surgery R Knee  Family History: Reviewed history from 03/11/2009 and no changes required. Family History of CAD Female 1st degree relative <60 Family History High cholesterol Family History Hypertension Family History of  Prostate CA 1st degree relative <50 Brother and sister have diabetes Mother died of MI at at 37 Sister with pacemaker  Social History: Reviewed history from 03/11/2009 and no changes required. Widow/Widower - husband died 2003-06-18 Occupation - retired from working in [employer] 2 daughter - one lives in [place], other in [place] Remote history of tobacco but none in 40 years Alcohol use-no  Review of  Systems       Some recent hematochezia but no fevers or chills, productive cough, hemoptysis, dysphasia, odynophagia, melena,  dysuria, hematuria, rash, seizure activity, orthopnea, PND, pedal edema, claudication. Remaining systems are negative.   Vital Signs:  Patient profile:   65 year old female Height:      65.5 inches Weight:      249.31 pounds BMI:     41.00 Pulse rate:   75 / minute Pulse rhythm:   regular Resp:     18 per minute BP sitting:   112 / 68  (right arm) Cuff size:   large  Vitals Entered By: Vikki Ports (March 19, 2009 9:23 AM)  Physical Exam  General:  Well developed/obese in NAD Skin warm/dry Patient not depressed No peripheral clubbing Back-normal HEENT-normal/normal eyelids Neck supple/normal carotid upstroke bilaterally; no bruits; no JVD; no thyromegaly chest - CTA/ normal expansion CV - RRR/normal S1 and S2; no murmurs, rubs or gallops;  PMI nondisplaced, heart sounds distant Abdomen -NT/ND, no HSM, no mass, + bowel sounds, no bruit 2+ femoral pulses, no bruits Ext-no edema, chords, 2+ DP; lymphedema left upper extremity Neuro-grossly nonfocal     EKG  Procedure date:  03/19/2009  Findings:      normal sinus rhythm at a rate of 75. Left bundle branch block.  Impression & Recommendations:  Problem # 1:  CHEST PAIN (ICD-786.50) Multiple chest pains. These have also been chronic. She did have a cardiac catheterization approximately one year ago that showed nonobstructive coronary disease. No further ischemia evaluation at this time. Her updated medication list for this problem includes:    Carvedilol 6.25 Mg Tabs (Carvedilol) .Marland Kitchen... Take one tablet by mouth twice a day  Problem # 2:  CARDIOMYOPATHY (ICD-425.4) Continue ARB. Increase carvedilol to 6.25 mg p.o. b.i.d. Repeat echocardiogram. Reduced LV function previously may have been related to prior chemotherapy. Her updated medication list for this problem includes:    Carvedilol 6.25 Mg  Tabs (Carvedilol) .Marland Kitchen... Take one tablet by mouth twice a day    Hydrochlorothiazide 25 Mg Tabs (Hydrochlorothiazide) .Marland Kitchen... Take 1 tablet by mouth once a day    Atacand 16 Mg Tabs (Candesartan cilexetil) .Marland Kitchen... Take 1 tablet by mouth once a day  Orders: Echocardiogram (Echo)  Problem # 3:  DIABETES MELLITUS, TYPE II, BORDERLINE (ICD-790.29) Management per primary care.  Problem # 4:  HYPERTENSION (ICD-401.9) Blood pressure controlled on present medications. Will continue. She does have some dyspnea. Check bmet and BMP. Her updated medication list for this problem includes:    Carvedilol 6.25 Mg Tabs (Carvedilol) .Marland Kitchen... Take one tablet by mouth twice a day    Hydrochlorothiazide 25 Mg Tabs (Hydrochlorothiazide) .Marland Kitchen... Take 1 tablet by mouth once a day    Atacand 16 Mg Tabs (Candesartan cilexetil) .Marland Kitchen... Take 1 tablet by mouth once a day  Problem # 5:  HYPERLIPIDEMIA (ICD-272.4) Continue statin. Lipids and liver monitored by primary care. Her updated medication list for this problem includes:    Crestor 10 Mg Tabs (Rosuvastatin calcium) ..... One by mouth qd  Problem # 6:  BREAST CANCER, HX OF (ICD-V10.3)  Patient Instructions: 1)  Your physician recommends that you schedule a follow-up appointment in: 3 MONTHS 2)  Your physician recommends that you return for lab work MV:HQIO ECHO 3)  Your physician has recommended you make the following change in your medication:INCREASE COREG TO 6.25MG  ONE TABLET TWICE DAILY 4)  Your physician has requested that you have an echocardiogram.  Echocardiography is a painless test that uses sound waves to create images of your heart. It provides your doctor with information about the size and shape of your heart and how well your heart's chambers and valves are working.  This procedure takes approximately one hour. There are no restrictions for this procedure. Prescriptions: CARVEDILOL 6.25 MG TABS (CARVEDILOL) Take one tablet by mouth twice a day  #60 x 12    Entered by:   Deliah Goody, RN   Authorized by:   Ferman Hamming, MD, [facility]   Signed by:   Deliah Goody, RN on 03/19/2009   Method used:   Electronically to        [facility] (retail)       [address]       [place], [place]  [address]       Ph: [phone] or [phone]       Fax: [phone]   RxID:   [id number]

## 2010-02-18 NOTE — Progress Notes (Signed)
Summary: prior authorization ?  Phone Note Call from Patient Call back at Home Phone [phone]   Caller: Patient Reason for Call: Refill Medication, Insurance Question Details for Reason: Insurance denied coverage for medication Summary of Call: Patient states she was previously prescribed a BP medication that caused adverse reaction and she requested to be given Ativan approx. 1 1/12 months ago but her insurance will not cover it with a prior authorization from physician.  Patient states she has been paying full price for 1 1/2 months and needs this taken care of as soon as possible.  Patient was advised that someone would contact her before the end of today regarding the outcome. Initial call taken by: Fabienne Bruns,  April 22, 2009 11:58 AM  Follow-up for Phone Call        Darlene,  BP med in question is atacand.  we can not fill out prior auth until she tries one of the other ARB medication.  I suggest she come in to pick up sample of diovan 160 mg  OV needed within one month of starting diovan  Follow-up by: D. Thomos Lemons DO,  April 22, 2009 1:08 PM  Additional Follow-up for Phone Call Additional follow up Details #1::        attempted to contact patient at [phone], no answer, voice message left for patient to return call regarding medication Additional Follow-up by: Glendell Docker CMA,  April 22, 2009 3:03 PM    Additional Follow-up for Phone Call Additional follow up Details #2::    call returned to patient, she was advised per Dr Artist Pais instructions. She was informed that samples of Diovan would be left at front desk for pick up  Follow-up by: Glendell Docker CMA,  April 23, 2009 1:48 PM  New/Updated Medications: DIOVAN 160 MG TABS (VALSARTAN) one by mouth once daily Prescriptions: DIOVAN 160 MG TABS (VALSARTAN) one by mouth once daily  #30 x 3   Entered and Authorized by:   D. Thomos Lemons DO   Signed by:   D. Thomos Lemons DO on 04/22/2009   Method used:   Electronically to         [facility] (retail)       [address]       [place], [place]  [address]       Ph: [phone] or [phone]       Fax: [phone]   RxID:   [id number]

## 2010-02-18 NOTE — Assessment & Plan Note (Signed)
Summary: 3  month follow up/mhf   Vital Signs:  Patient profile:   65 year old female Height:      65.5 inches Weight:      232.50 pounds BMI:     38.24 O2 Sat:      98 % on Room air Temp:     97.9 degrees F oral Pulse rate:   81 / minute Pulse rhythm:   regular Resp:     18 per minute BP sitting:   106 / 60  (right arm) Cuff size:   large  Vitals Entered By: Glendell Docker CMA (July 02, 2009 9:05 AM)  O2 Flow:  Room air CC: Rm 3 - 3 Month Follow up , CHF Management, Heartburn Pain Assessment Patient in pain? yes     Location: abdomen Intensity: 3 Type: stinging/ burining Onset of pain  Intermittent     Last PAP Result Declined   Visit Type:  3 months follow up Primary Care Provider:  Dondra Spry DO  CC:  Rm 3 - 3 Month Follow up , CHF Management, and Heartburn.  History of Present Illness:  Heartburn      This is a 65 year old woman who presents with Heartburn.  The patient reports acid reflux and epigastric pain, but denies trouble swallowing and weight loss.  Treatment that was tried and either found to be ineffective or stopped due to problems include an H2 blocker.    Left leg improved - she was seen by Dr. Wilkie Aye (neurosurgeon) who recommended back injections she attributes leg pain to crestor  htn -stable  DM II borderline - some wt loss.  better with diet / calorie intake  MRI of LS spine reviewed  Allergies: 1)  ! Pcn 2)  ! Aleve (Naproxen Sodium) 3)  Lisinopril (Lisinopril) 4)  Simvastatin (Simvastatin)  Past History:  Past Medical History: History of nonischemic cardiomyopathy improved by most recent echocardiogram. Left bundle-branch block   Breast cancer, hx of (diagnosed 06/25/2007) Colonic polyps, hx of Hyperlipidemia Hypertension  Osteoarthritis - Left Knee pain    MRI in 06-25-2006 - tricompartmental degenerate changes,  mucoid degeneration of ACL   post horn meniscal tear and ant horn meniscal tear Hx of low back pain -  Left L3-4  injected  Past Surgical History: Appendectomy  06-25-07 - Dr. Ezzard Standing CTS surgery 06/24/2005 - Dr. Teressa Senter Hysterectomy Lumpectomy  Tonsillectomy  Arthroscopic surgery R Knee    Family History: Family History of CAD Female 1st degree relative <60 Family History High cholesterol Family History Hypertension Family History of Prostate CA 1st degree relative <50 Brother and sister have diabetes Mother died of MI at at 68 Sister with pacemaker    Social History: Widow/Widower - husband died 06-25-2003 Occupation - retired from working in [employer]  2 daughter - one lives in [place], other in [place] Remote history of tobacco but none in 40 years  Alcohol use-no  Review of Systems       The patient complains of abdominal pain.  The patient denies weight gain.         denies dysphagia.  no significant low back pain  Physical Exam  General:  alert, well-developed, and well-nourished.   Lungs:  normal respiratory effort and normal breath sounds.   Heart:  normal rate, regular rhythm, and no gallop.   Abdomen:  epigastric tenderness,  soft and no masses.   Msk:  left knee - no effusion ,  question bakers cyst. pain with  knee flexion (back of knee) Extremities:  No lower extremity edema  Neurologic:  cranial nerves II-XII intact, strength normal in all extremities, and DTRs of lower ext symmetrical and normal.   Psych:  normally interactive and good eye contact.     Impression & Recommendations:  Problem # 1:  GERD (ICD-530.81) she has chronic GERD.  she was prev on Nexium but stopped due to cost.   restart PPI.  she has significant epigastric tenderness.  refer to GI for endoscopy and H. Pylori testing.  Her updated medication list for this problem includes:    Omeprazole 20 Mg Cpdr (Omeprazole) ..... One by mouth qam 15 mins before am meal  Orders: Gastroenterology Referral (GI)  Problem # 2:  LEG PAIN, LEFT (ICD-729.5) Assessment: Improved Her leg pain is better.  she attributes to  taking crestor.  I reviewed MRI of LS spine.  I doubt symptoms are radicular.  I would hold off on back injections since her symptoms are better.  She has hx knee arthritis.  she had discomfort at limit of knee flexion.  I suspect left leg pain coming for / around knee joint.  If symptoms get worse,  pt advised to f/u with orhto. she understands wt loss would help reduce joint stress.  I urged non wt bearing exercises  Problem # 3:  DIABETES MELLITUS, TYPE II, BORDERLINE (ICD-790.29) monitor A1c.  wt trending lower.  Pt counseled on diet and exercise.   Orders: T-Basic Metabolic Panel [phone]) T- Hemoglobin A1C ([id number])  Problem # 4:  HYPERLIPIDEMIA (ICD-272.4) she stopped Crestor due to left leg pain.  I doubt statin was cause.   she defers trial of pravastatin for now Labs Reviewed: SGOT: 21 (03/11/2009)   SGPT: 17 (03/11/2009)   HDL:47 (03/11/2009)  LDL:161 (03/11/2009)  Chol:253 (03/11/2009)  Trig:225 (03/11/2009)  Complete Medication List: 1)  Vitamin C 500 Mg Tabs (Ascorbic acid) .... Take 1 tablet by mouth once a day 2)  Multivitamins Tabs (Multiple vitamin) .... Take 1 tablet by mouth once a day 3)  Potassium Chloride Cr 10 Meq Cr-caps (Potassium chloride) .... Take 1 capsule by mouth once a day 4)  Claritin 10 Mg Tabs (Loratadine) .... Take 1 tablet by mouth once a day as needed 5)  Carvedilol 6.25 Mg Tabs (Carvedilol) .... Take one tablet by mouth twice a day 6)  Alpha-lipoic Acid 200 Mg Caps (Alpha-lipoic acid) .... Take 1 capsule by mouth once a day 7)  Promethazine Hcl 25 Mg Tabs (Promethazine hcl) .... Take 1 tablet by mouth once a day as needed. 8)  Hydrochlorothiazide 25 Mg Tabs (Hydrochlorothiazide) .... Take 1 tablet by mouth once a day 9)  B Complex Tabs (B complex vitamins) .... Take 1 tablet by mouth once a day 10)  Flovent Hfa 110 Mcg/act Aero (Fluticasone propionate  hfa) .... 2 puffs twice daily 11)  Atacand 16 Mg Tabs (Candesartan cilexetil) .... Take 1  tablet by mouth once a day 12)  Magnesium 250 Mg Tabs (Magnesium) .... Take 1 tablet by mouth once a day 13)  Vitamin D3 2000 Unit Tabs (Cholecalciferol) .... One tablet by mouth every other day 14)  Omeprazole 20 Mg Cpdr (Omeprazole) .... One by mouth qam 15 mins before am meal   Patient Instructions: 1)  Perform non weight bearing exercises daily 2)  Our office will contact you re:  referral to stomach doctor 3)  Please schedule a follow-up appointment in 2 months. Prescriptions: MAGNESIUM 250 MG TABS (MAGNESIUM) Take  1 tablet by mouth once a day  #90 x 1   Entered and Authorized by:   D. Thomos Lemons DO   Signed by:   D. Thomos Lemons DO on 07/02/2009   Method used:   Electronically to        [facility] (retail)       [address]       [place], [place]  [address]       Ph: [phone] or [phone]       Fax: [phone]   RxID:   [id number] ATACAND 16 MG TABS (CANDESARTAN CILEXETIL) Take 1 tablet by mouth once a day  #90 x 1   Entered and Authorized by:   D. Thomos Lemons DO   Signed by:   D. Thomos Lemons DO on 07/02/2009   Method used:   Electronically to        [facility] (retail)       [address]       [place], [place]  [address]       Ph: [phone] or [phone]       Fax: [phone]   RxID:   [id number] HYDROCHLOROTHIAZIDE 25 MG TABS (HYDROCHLOROTHIAZIDE) Take 1 tablet by mouth once a day  #90 x 1   Entered and Authorized by:   D. Thomos Lemons DO   Signed by:   D. Thomos Lemons DO on 07/02/2009   Method used:   Electronically to        [facility] (retail)       [address]       [place], [place]  [address]       Ph: [phone] or [phone]       Fax: [phone]   RxID:   [id number] OMEPRAZOLE 20 MG CPDR (OMEPRAZOLE) one by mouth qam 15 mins before AM meal  #30 x 5   Entered and Authorized by:   D. Thomos Lemons DO   Signed by:   D.  Thomos Lemons DO on 07/02/2009   Method used:   Electronically to        [facility] (retail)       [address]       [place], [place]  [address]       Ph: [phone] or [phone]       Fax: [phone]   RxID:   [id number]   Current Allergies (reviewed today): ! PCN ! ALEVE (NAPROXEN SODIUM) LISINOPRIL (LISINOPRIL) SIMVASTATIN (SIMVASTATIN)   Preventive Care Screening  Pap Smear:    Date:  07/02/2009    Results:  Declined  Bone Density:    Date:  05/27/2009    Results:  abnormal std dev  Mammogram:    Date:  05/27/2009    Results:  normal

## 2010-02-19 NOTE — Miscellaneous (Signed)
Summary: PT Initial Summary/[facility]  PT Initial [facility]   Imported By: Lanelle Bal 12/30/2009 13:21:10  _____________________________________________________________________  External Attachment:    Type:   Image     Comment:   External Document

## 2010-03-02 ENCOUNTER — Encounter: Payer: Self-pay | Admitting: Internal Medicine

## 2010-03-17 NOTE — Miscellaneous (Signed)
Summary: Discharge Summary for PT/[facility]   Discharge Summary for PT/[facility]   Imported By: Maryln Gottron 03/12/2010 12:23:41  _____________________________________________________________________  External Attachment:    Type:   Image     Comment:   External Document

## 2010-03-30 ENCOUNTER — Ambulatory Visit (INDEPENDENT_AMBULATORY_CARE_PROVIDER_SITE_OTHER): Payer: Medicare Other | Admitting: Internal Medicine

## 2010-03-30 ENCOUNTER — Encounter (HOSPITAL_BASED_OUTPATIENT_CLINIC_OR_DEPARTMENT_OTHER): Payer: Medicare Other | Admitting: Hematology & Oncology

## 2010-03-30 ENCOUNTER — Ambulatory Visit: Payer: Self-pay | Admitting: Internal Medicine

## 2010-03-30 ENCOUNTER — Other Ambulatory Visit: Payer: Self-pay | Admitting: Hematology & Oncology

## 2010-03-30 ENCOUNTER — Encounter: Payer: Self-pay | Admitting: Internal Medicine

## 2010-03-30 DIAGNOSIS — Z139 Encounter for screening, unspecified: Secondary | ICD-10-CM

## 2010-03-30 DIAGNOSIS — I10 Essential (primary) hypertension: Secondary | ICD-10-CM

## 2010-03-30 DIAGNOSIS — E785 Hyperlipidemia, unspecified: Secondary | ICD-10-CM

## 2010-03-30 DIAGNOSIS — I890 Lymphedema, not elsewhere classified: Secondary | ICD-10-CM

## 2010-03-30 DIAGNOSIS — Z853 Personal history of malignant neoplasm of breast: Secondary | ICD-10-CM

## 2010-03-30 DIAGNOSIS — M79609 Pain in unspecified limb: Secondary | ICD-10-CM

## 2010-03-30 LAB — CONVERTED CEMR LAB
AST: 18 units/L (ref 0–37)
Albumin: 4.3 g/dL (ref 3.5–5.2)
Basophils Absolute: 0 10*3/uL (ref 0.0–0.1)
CO2: 27 meq/L (ref 19–32)
Calcium: 9.9 mg/dL (ref 8.4–10.5)
Chloride: 100 meq/L (ref 96–112)
Eosinophils Relative: 5 % (ref 0–5)
HDL: 41 mg/dL (ref >39)
Lymphocytes Relative: 54 % — ABNORMAL HIGH (ref 12–46)
Neutro Abs: 2.4 10*3/uL (ref 1.7–7.7)
Neutrophils Relative %: 35 % — ABNORMAL LOW (ref 43–77)
Platelets: 321 10*3/uL (ref 150–400)
Potassium: 3.7 meq/L (ref 3.5–5.3)
RDW: 14.8 % (ref 11.5–15.5)
Sodium: 142 meq/L (ref 135–145)
TSH: 0.591 microintl units/mL (ref 0.350–4.500)
Total Bilirubin: 0.3 mg/dL (ref 0.3–1.2)
Total CHOL/HDL Ratio: 7
VLDL: 33 mg/dL (ref 0–40)
WBC: 6.7 10*3/uL (ref 4.0–10.5)

## 2010-03-31 ENCOUNTER — Telehealth: Payer: Self-pay | Admitting: Internal Medicine

## 2010-04-03 LAB — COMPREHENSIVE METABOLIC PANEL
ALT: 21 U/L (ref 0–35)
Alkaline Phosphatase: 110 U/L (ref 39–117)
BUN: 11 mg/dL (ref 6–23)
CO2: 26 mEq/L (ref 19–32)
Calcium: 9.7 mg/dL (ref 8.4–10.5)
GFR calc Af Amer: >60 mL/min (ref >60)
GFR calc non Af Amer: >60 mL/min (ref >60)
Glucose, Bld: 108 mg/dL — ABNORMAL HIGH (ref 70–99)
Sodium: 141 mEq/L (ref 135–145)

## 2010-04-03 LAB — CBC
HCT: 37.2 % (ref 36.0–46.0)
Hemoglobin: 12.7 g/dL (ref 12.0–15.0)
RBC: 4.07 MIL/uL (ref 3.87–5.11)
RDW: 13.4 % (ref 11.5–15.5)
WBC: 7.5 10*3/uL (ref 4.0–10.5)

## 2010-04-03 LAB — DIFFERENTIAL
Basophils Absolute: 0 10*3/uL (ref 0.0–0.1)
Basophils Relative: 1 % (ref 0–1)
Lymphocytes Relative: 28 % (ref 12–46)
Monocytes Relative: 5 % (ref 3–12)
Neutrophils Relative %: 61 % (ref 43–77)

## 2010-04-03 LAB — URINALYSIS, ROUTINE W REFLEX MICROSCOPIC
Bilirubin Urine: NEGATIVE
Hgb urine dipstick: NEGATIVE
Ketones, ur: NEGATIVE mg/dL
Specific Gravity, Urine: 1.01 (ref 1.005–1.030)
pH: 6 (ref 5.0–8.0)

## 2010-04-03 LAB — URINE MICROSCOPIC-ADD ON

## 2010-04-03 LAB — POCT CARDIAC MARKERS

## 2010-04-05 LAB — CBC
HCT: 35.7 % — ABNORMAL LOW (ref 36.0–46.0)
Hemoglobin: 12 g/dL (ref 12.0–15.0)
MCH: 30.7 pg (ref 26.0–34.0)
MCHC: 33.7 g/dL (ref 30.0–36.0)
MCV: 91.2 fL (ref 78.0–100.0)
Platelets: 293 10*3/uL (ref 150–400)
RBC: 3.91 MIL/uL (ref 3.87–5.11)
RDW: 13.5 % (ref 11.5–15.5)
WBC: 9.4 10*3/uL (ref 4.0–10.5)

## 2010-04-05 LAB — BASIC METABOLIC PANEL
BUN: 11 mg/dL (ref 6–23)
Calcium: 9.5 mg/dL (ref 8.4–10.5)
Chloride: 104 mEq/L (ref 96–112)
Sodium: 143 mEq/L (ref 135–145)

## 2010-04-05 LAB — DIFFERENTIAL
Basophils Absolute: 0 10*3/uL (ref 0.0–0.1)
Basophils Relative: 1 % (ref 0–1)
Eosinophils Absolute: 0.4 10*3/uL (ref 0.0–0.7)
Eosinophils Relative: 5 % (ref 0–5)
Lymphocytes Relative: 36 % (ref 12–46)
Lymphs Abs: 3.4 10*3/uL (ref 0.7–4.0)
Monocytes Absolute: 0.6 10*3/uL (ref 0.1–1.0)
Monocytes Relative: 7 % (ref 3–12)
Neutro Abs: 5 10*3/uL (ref 1.7–7.7)
Neutrophils Relative %: 53 % (ref 43–77)

## 2010-04-05 LAB — SEDIMENTATION RATE: Sed Rate: 61 mm/h — ABNORMAL HIGH (ref 0–22)

## 2010-04-07 NOTE — Progress Notes (Signed)
Summary: Lab Results  Phone Note Outgoing Call   Summary of Call: call pt - LDL or bad cholesterol is significantly  elevated - 213.   I suggest pt try taking livalo pt should pick up samples of 2 mg.   see rx  I suggest repeat FLP and LFTs in 2 months Initial call taken by: D. Thomos Lemons DO,  March 31, 2010 5:41 PM  Follow-up for Phone Call        call placed to patient at [phone] no  answer. A detailed voice message was left informing patient per Dr Artist Pais instructions. Message left informing samples of Livalo would be left at front desk for patient pick up. Follow-up by: Glendell Docker CMA,  April 01, 2010 9:06 AM    New/Updated Medications: LIVALO 2 MG TABS (PITAVASTATIN CALCIUM) one by mouth once daily Prescriptions: LIVALO 2 MG TABS (PITAVASTATIN CALCIUM) one by mouth once daily  #30 x 2   Entered and Authorized by:   D. Thomos Lemons DO   Signed by:   D. Thomos Lemons DO on 03/31/2010   Method used:   Electronically to        [facility] (retail)       [address]       [place], [place]  [address]       Ph: [phone] or [phone]       Fax: [phone]   RxID:   [id number]

## 2010-04-21 NOTE — Assessment & Plan Note (Signed)
Summary: 3 MONTH FOLLOW UP/MHF   Vital Signs:  Patient profile:   65 year old female Height:      65.5 inches Weight:      233.75 pounds BMI:     38.44 O2 Sat:      100 % on Room air Temp:     97.5 degrees F oral Pulse rate:   67 / minute Resp:     18 per minute BP sitting:   130 / 70  (right arm) Cuff size:   large  Vitals Entered By: Glendell Docker CMA (March 30, 2010 11:37 AM)  O2 Flow:  Room air CC: 3 month Follow up  Is Patient Diabetic? No Pain Assessment Patient in pain? no      Comments fasting for blood work, seen by Dr Drue Dun today-deferred labs to Dr Artist Pais,    Primary Care Provider:  D. Thomos Lemons DO  CC:  3 month Follow up .  History of Present Illness: 65 y/o AA female with hth for follow up pt seen by PT fo left leg pain leg, knee pain - much better with exercises  htn - stable  hyperlipidemia - pt still reluctant to take statin.   fair dietary compliance  Preventive Screening-Counseling & Management  Alcohol-Tobacco     Smoking Status: never  Past History:  Past Medical History: History of nonischemic cardiomyopathy improved by most recent echocardiogram. Left bundle-branch block   Breast cancer, hx of (diagnosed 06-17-07)  Colonic polyps, hx of (unclear pathology) Hyperlipidemia  Hypertension  Osteoarthritis - Left Knee pain    MRI in 17-Jun-2006 - tricompartmental degenerate changes,  mucoid degeneration of ACL   post horn meniscal tear and ant horn meniscal tear Hx of low back pain -  Left L3-4 injected  Past Surgical History: Appendectomy  Jun 17, 2007 - Dr. Ezzard Standing CTS surgery Jun 16, 2005 - Dr. Teressa Senter Hysterectomy Lumpectomy  Tonsillectomy   Arthroscopic surgery R Knee      Family History: Family History of CAD Female 1st degree relative <60 Family History High cholesterol Family History Hypertension Family History of Prostate CA 1st degree relative <50 Brother and sister have diabetes Mother died of MI at at 25 Sister with pacemaker   no colon  cancer     Social History: Widow/Widower - husband died 2003-06-17 Occupation - retired from working in Museum/gallery curator  2 daughter - one lives in [place], other in [place] history of tobacco but none in 40 years  Alcohol use-no        Physical Exam  General:  alert and overweight-appearing.   Lungs:  normal respiratory effort and normal breath sounds.   Heart:  normal rate, regular rhythm, and no gallop.   Extremities:  No lower extremity edema   Impression & Recommendations:  Problem # 1:  LEG PAIN, LEFT (ICD-729.5) Assessment Improved Improved with PT  Problem # 2:  HYPERTENSION (ICD-401.9) Assessment: Unchanged  Her updated medication list for this problem includes:    Carvedilol 6.25 Mg Tabs (Carvedilol) .Marland Kitchen... Take one tablet by mouth twice a day    Hydrochlorothiazide 25 Mg Tabs (Hydrochlorothiazide) .Marland Kitchen... Take 1 tablet by mouth once a day    Atacand 16 Mg Tabs (Candesartan cilexetil) .Marland Kitchen... Take 1 tablet by mouth once a day  BP today: 130/70 Prior BP: 130/70 (12/15/2009)  Labs Reviewed: K+: 3.9 (12/15/2009) Creat: : 0.78 (12/15/2009)   Chol: 253 (03/11/2009)   HDL: 47 (03/11/2009)   LDL: 161 (03/11/2009)   TG: 225 (03/11/2009)  Problem #  3:  HYPERLIPIDEMIA (ICD-272.4) Assessment: Unchanged  Her updated medication list for this problem includes:    Livalo 2 Mg Tabs (Pitavastatin calcium) ..... One by mouth once daily  Orders: T-Hepatic Function [id number]) T-Lipid Profile [id number]) T-TSH [id number]  Labs Reviewed: SGOT: 24 (09/30/2009)   SGPT: 22 (09/30/2009)   HDL:47 (03/11/2009)  LDL:161 (03/11/2009)  Chol:253 (03/11/2009)  Trig:225 (03/11/2009)  Complete Medication List: 1)  Vitamin C 500 Mg Tabs (Ascorbic acid) .... Take 1 tablet by mouth once a day 2)  Multivitamins Tabs (Multiple vitamin) .... Take 1 tablet by mouth once a day 3)  Carvedilol 6.25 Mg Tabs (Carvedilol) .... Take one tablet by mouth twice a day 4)  Alpha-lipoic Acid 200 Mg Caps  (Alpha-lipoic acid) .... Take 1 capsule by mouth once a day 5)  Hydrochlorothiazide 25 Mg Tabs (Hydrochlorothiazide) .... Take 1 tablet by mouth once a day 6)  B Complex Tabs (B complex vitamins) .... Take 1 tablet by mouth once a day 7)  Atacand 16 Mg Tabs (Candesartan cilexetil) .... Take 1 tablet by mouth once a day 8)  Magnesium 250 Mg Tabs (Magnesium) .... Take 1 tablet by mouth once a day 9)  Vitamin D3 2000 Unit Tabs (Cholecalciferol) .... One tablet by mouth every other day 10)  Livalo 2 Mg Tabs (Pitavastatin calcium) .... One by mouth once daily  Other Orders: T-Basic Metabolic Panel [id number]) T-CBC w/Diff [id number])  Patient Instructions: 1)  Please schedule a follow-up appointment in 6 months. 2)  [email or web address] Prescriptions: ATACAND 16 MG TABS (CANDESARTAN CILEXETIL) Take 1 tablet by mouth once a day  #90 x 1   Entered and Authorized by:   D. Thomos Lemons DO   Signed by:   D. Thomos Lemons DO on 03/30/2010   Method used:   Print then Give to Patient   RxID:   [id number] HYDROCHLOROTHIAZIDE 25 MG TABS (HYDROCHLOROTHIAZIDE) Take 1 tablet by mouth once a day  #90 x 1   Entered and Authorized by:   D. Thomos Lemons DO   Signed by:   D. Thomos Lemons DO on 03/30/2010   Method used:   Print then Give to Patient   RxID:   [id number] CARVEDILOL 6.25 MG TABS (CARVEDILOL) Take one tablet by mouth twice a day  #180 x 1   Entered and Authorized by:   D. Thomos Lemons DO   Signed by:   D. Thomos Lemons DO on 03/30/2010   Method used:   Print then Give to Patient   RxID:   [id number]    Orders Added: 1)  T-Basic Metabolic Panel [[id number]] 2)  T-Hepatic Function [[id number]] 3)  T-Lipid Profile [[id number]] 4)  T-CBC w/Diff [[id number]] 5)  T-TSH [[id number]] 6)  Est. Patient Level III [62831]    Current Allergies (reviewed today): ! PCN ! ALEVE (NAPROXEN SODIUM) LISINOPRIL (LISINOPRIL) SIMVASTATIN (SIMVASTATIN)

## 2010-05-08 ENCOUNTER — Encounter (HOSPITAL_BASED_OUTPATIENT_CLINIC_OR_DEPARTMENT_OTHER): Payer: Self-pay

## 2010-05-08 ENCOUNTER — Ambulatory Visit (HOSPITAL_BASED_OUTPATIENT_CLINIC_OR_DEPARTMENT_OTHER)
Admission: RE | Admit: 2010-05-08 | Discharge: 2010-05-08 | Disposition: A | Discharge status: Home / Self Care | Payer: Medicare Other | Source: Ambulatory Visit | Attending: Hematology & Oncology | Admitting: Hematology & Oncology

## 2010-05-08 DIAGNOSIS — Z1231 Encounter for screening mammogram for malignant neoplasm of breast: Secondary | ICD-10-CM | POA: Insufficient documentation

## 2010-05-08 DIAGNOSIS — Z139 Encounter for screening, unspecified: Secondary | ICD-10-CM

## 2010-06-02 NOTE — H&P (Signed)
Brenda Marsh, Brenda Marsh            ACCOUNT NO.:  [id number]   MEDICAL RECORD NO.:  [id number]          PATIENT TYPE:  INP   LOCATION:  5123                         FACILITY:  [facility]   PHYSICIAN:  Sandria Bales. Ezzard Standing, M.D.  DATE OF BIRTH:  01/21/1945   DATE OF ADMISSION:  07/23/2007  DATE OF DISCHARGE:                              HISTORY & PHYSICAL   Date of Admission ??   HISTORY OF ILLNESS:  This is a 65 year old black female who goes  prim[facility] for her primary  care, but she cannot name a specific physician.  She developed abdominal  pain, which began at 2 a.m. this morning.  She went to [facility] 2022-06-09  morning, but she is hurting bad enough, she went to the [facility] ER Facility  off [address] beside [address] in [place].  She was evaluated by  Dr. Cheri Guppy who called me and he said a CT scan shows what  appears to be appendicitis and I accepted her transfer over to the [facility]  Emergency Room.   She has been seen by Dr. Nedra Hai at [facility] from a GI standpoint.  She  said she underwent upper endoscopy and colonoscopy in 06-09-2006, which was  unremarkable.  She has had no liver disease, gallbladder disease,  pancreatic disease.  She had a vaginal hysterectomy for benign causes  about 30 years ago in the 1970s.   PAST MEDICAL HISTORY:  She is allergic to PENICILLIN but she is well,  this is over 30 years ago.   CURRENT MEDICATIONS:  1. Hydrochlorothiazide.  2. Potassium.  3. Zetia.  4. Atacand.  5. Femara.   REVIEW OF SYSTEMS:  NEUROLOGIC:  She has had no seizure or loss of  conscious.  PULMONARY:  She has had no pneumonia or tuberculosis.  CARDIAC:  She has been hypertensive for maybe 2 or 3 years and seen Dr.  Linton Ham of [facility] and she identifies the cardiologist, but she has  never a cardiac catheterization.  No chest pain.  GASTROINTESTINAL:  See history of present illness.  UROLOGIC:  No history of kidney stones, though  she says she was  diagnosed with E. coli UTI a week ago, but she has not picked to start  her Cipro yet, but I am not sure how much of symptoms she is having  right now.   Her husband died in 06-09-03, so she lives by herself.  She is widowed.  She  has 2 children, 1 Brenda Marsh is at the bedside.  Brenda as well as her  son from [place] is visiting her.   She also has a history of breast cancer.  I think of the left breast,  treated with lobectomy in 06/08/1997.  She was treated by Dr. Valera Castle,  surgeon and Dr. Arlan Organ is her medical oncologist.  She is still  on Femara for the breast cancer.   PHYSICAL EXAMINATION:  VITAL SIGNS:  Her pulse is 88.  Her weight is 236  and her height 5 feet 5 inches.  GENERAL:  She is  a well-nourished obese black female.  HEENT:  Unremarkable.  NECK:  Supple without mass or thyromegaly.  LYMPH NODE:  She has a cervical, supraclavicular, axillary, or inguinal  adenopathy.  I did not examine her breasts.  She said she has had  followup mammograms.  LUNGS:  Clear to auscultation.  HEART:  Regular rhythm.  I heard murmur and rub.  ABDOMEN:  Obese, but she has some tenderness and guarding in the right  lower quadrant.  There are no organomegaly.  Her obesity surely limits  the exam to some extent.  EXTREMITIES:  She has good strength in the upper and lower extremities.  NEUROLOGIC:  Grossly intact.   LABORATORY DATA:  Her Marsh blood count is 1100.  Her CT scan is  consistent with appendicitis.   IMPRESSION:  1. Appendicitis.  I discussed with her and her daughter about      proceeding with appendectomy tonight.  I have told that I will try      this laparoscopically, but there is a chance of open surgery.   Other potential complications include bleeding.  I think she already has  an infection, the need for a larger open incision, the possibility to  remove bowel, appendix and nerve injury.   1. Hypertension.  2. Hypercholesterolemia.  3.  Chronic back pain.  She sees Dr. Loman Chroman for knee evaluation 6 months      ago.  4. Chronic knee symptoms.  5. History of carpel tunnel in 2004.  6. History of breast cancer disease at this time.  7. Morbid obesity.      Sandria Bales. Ezzard Standing, M.D.  Electronically Signed     DHN/MEDQ  D:  07/23/2007  T:  07/24/2007  Job:  [id number]   cc:   Dr. Esperanza Richters R. Myna Hidalgo, M.D.  Dr. Nedra Hai

## 2010-06-02 NOTE — Discharge Summary (Signed)
[name], Marsh            ACCOUNT NO.:  [id number]   MEDICAL RECORD NO.:  [id number]          PATIENT TYPE:  INP   LOCATION:  5123                         FACILITY:  [facility]   PHYSICIAN:  Lennie Muckle, MD      DATE OF BIRTH:  10-24-1945   DATE OF ADMISSION:  07/23/2007  DATE OF DISCHARGE:  07/25/2007                               DISCHARGE SUMMARY   ADMITTING PHYSICIAN:  Sandria Bales. Ezzard Standing, MD   DISCHARGING PHYSICIAN:  Lennie Muckle, MD   CHIEF COMPLAINT/REASON FOR ADMISSION:  Ms. Brenda Marsh is a 62-year female  patient who receives her primary medical care in [facility], has history  of hypertension.  The patient developed abrupt onset of abdominal pain  at 2 a.m. on the morning of admission, and finally presented to [facility] ER,  where CT scan demonstrated what was felt to be appendicitis.  She was  actually evaluated at the [facility] ER Facility of hallway [address] and was  subsequently transferred over to the [facility] ER here in [place].   PHYSICAL EXAMINATION:  Her vital signs were stable.  Her abdomen was  obese, tender, and guarding in the right lower quadrant.   LABORATORY DATA:  White cell count was elevated at 11,000.  CT scan was  reviewed by Dr. Ezzard Standing and was consistent with appendicitis.   ADMISSION DIAGNOSES:  The patient was admitted with the following  diagnoses:  1. Acute appendicitis.  2. Hypertension.  3. Dyslipidemia.  4. Chronic back pain.  5. Obesity.   HOSPITAL COURSE:  The patient was admitted and subsequently taken to the  OR by Dr. Ezzard Standing, where she underwent laparoscopic appendectomy for  acute purulent appendicitis.  The patient tolerated the procedure well  and sent back to the general floor to recover.   On postop day #1, July 24, 2007, the patient was taking sips and not  having nausea, but later in the day developed significant nausea, was  unable to tolerate oral pain medications other than Tylenol, which was  not covering her pain.  Toradol was  added, and oral Vicodin was ordered  so that the patient could tolerate.  Late in the afternoon, around 6-7  in the evening on postop day #1, the patient was finally able to keep  more than clear liquids down and some crackers, so she was started on  Vicodin in addition to the Toradol, which she tolerated, and this helped  her pain.   On postop day #2, the patient was tolerating a clear liquid diet and  Vicodin and requesting regular food.  On exam, her abdomen was soft,  bowel sounds were present, and her incisions were clean, dry, and  intact.  Dressings were in place and dry.  Plans were to advance the  patient to a regular diet, and if she tolerated, discharge home.   DISCHARGE DIAGNOSES:  1. Acute purulent appendicitis.  2. Status post laparoscopic appendectomy.   DISCHARGE MEDICATIONS:  The patient will resume the following home  medications:  1. Femara one 2.5 mg daily.  2. Zetia 10 mg daily.  3. Hydrochlorothiazide 25 mg  daily.  4. Klor-Con 10 mEq daily.  5. Atacand 16 mg daily.   NEW MEDICATIONS:  1. Vicodin 5/325, 1-2 every 4 hours as needed for pain.  2. Do not take the Tylenol since this medication contains Tylenol.  3. Over-the-counter ibuprofen 1-3 tabs every 8 hours as needed for      pain, always take with food or snack.   OTHER INSTRUCTIONS:  Remove bandages.  May shower starting today.  Discharge.  No driving for 3 weeks.  No lifting greater than 10 pounds  for 2 weeks.   FOLLOWUP APPOINTMENTS:  She is to follow up with Dr. Ezzard Standing in the  office in 2-3 weeks.  She is to call for an appointment.  The telephone  number is [phone].      Allison L. Kennith Center, MD  Electronically Signed    ALE/MEDQ  D:  07/25/2007  T:  07/26/2007  Job:  [id number]   cc:   Sandria Bales. Ezzard Standing, M.D.  Dr. Nedra Hai  Dr. Ferrel Logan R. Myna Hidalgo, M.D.

## 2010-06-02 NOTE — Op Note (Signed)
NAMESHANIQWA, HORSMAN            ACCOUNT NO.:  [id number]   MEDICAL RECORD NO.:  [id number]          PATIENT TYPE:  INP   LOCATION:  5123                         FACILITY:  [facility]   PHYSICIAN:  Sandria Bales. Ezzard Standing, M.D.  DATE OF BIRTH:  December 23, 1945   DATE OF PROCEDURE:  07/23/2007  DATE OF DISCHARGE:                               OPERATIVE REPORT   Date of surgery ??   PREOPERATIVE DIAGNOSIS:  Acute appendicitis.   POSTOPERATIVE DIAGNOSIS:  Acute purulent appendicitis.   PROCEDURES:  Laparoscopic appendectomy.   SURGEON:  Sandria Bales. Ezzard Standing, MD.   FIRST ASSISTANT:  None.   ANESTHESIA:  General endotracheal with 20 mL of 0.25%  Marcaine.   COMPLICATION:  None.   INDICATIONS FOR PROCEDURE:  Ms. Pietila is a 65 year old black female  who is seen by the [facility] for her primary  care and sees Dr. Arlan Organ for followup of her breast cancer.  She  presented with acute abdominal pain to the [facility] and was evaluated by Dr. Cheri Guppy and found to have  appendicitis and sent to the [facility] ER.   I discussed with the patient of proceeding with appendectomy tonight.  The indications and potential complications were explained to the  patient.  Potential complication include, but not limited to bleeding,  infection, the possibility of open surgery, the possibility of requiring  bowel resection.   OPERATIVE NOTE:  The patient has been given clindamycin and gentamicin  in the emergency room.  She had PS stockings in place, Foley catheter in  place.  Her abdomen was prepped with Betadine solution and sterilely  draped.  She was given general endotracheal anesthetic by Arta Bruce.  Again, her abdomen was prepped.  A time-out was held to identify the  patient and the procedure.   I then did a infraumbilical incision with sharp dissection carried down  to the abdominal cavity.  A 0 degree 10-mm laparoscope was inserted  through a  12-mm Hasson trocar.  I put a 5-mm trocar in the right upper  quadrant, a 10-mm trocar in the left lower quadrant.  I then did an  abdominal exploration.  The right and left lobes of the liver were  unremarkable.  Stomach was unremarkable.  The remainder of her bowel was  pretty much covered with omentum and hard to identify or find.  I  finally did dissect out where the cecum was and identified an appendix,  which was curled back on itself and purely consistent with acute  appendicitis.   I took down the mesentery of the appendix using a Harmonic scalpel.  I  fired a vascular load of the [employer] GIA stapler across the base of  appendix and placed the appendix in the EndoCatch bag and delivered it  through the umbilicus.   I did irrigate the abdomen with about 600 mL of saline.  I reinspected  the staple line which looked good and there was no active bleeding.  I  then removed the trocars, there was no bleeding from any trocar site.  The  umbilical port was closed with 0 Vicryl suture. The skin at each  port was closed with a 5-0 Vicryl suture, painted with tincture of  Benzoin and Steri-Strips.   The patient tolerated the procedure well.  Sponge and needle counts were  correct at the end of the case.  The patient was transferred to recovery  room in good condition.      Sandria Bales. Ezzard Standing, M.D.  Electronically Signed     DHN/MEDQ  D:  07/23/2007  T:  07/24/2007  Job:  [id number]   cc:   Rose Phi. Myna Hidalgo, M.D.  [facility] [place]  Mallory Shirk, MD

## 2010-06-02 NOTE — Discharge Summary (Signed)
Brenda Marsh, Brenda Marsh            ACCOUNT NO.:  [id number]   MEDICAL RECORD NO.:  [id number]          PATIENT TYPE:  INP   LOCATION:  5123                         FACILITY:  [facility]   PHYSICIAN:  Lennie Muckle, MD      DATE OF BIRTH:  [birth year]   DATE OF ADMISSION:  07/23/2007  DATE OF DISCHARGE:  07/25/2007                               DISCHARGE SUMMARY   FINAL DIAGNOSES:  Appendicitis.   HISTORY OF PRESENT ILLNESS:  [name] is a 65 year old female who was  admitted with abdominal pain and was found to have appendicitis by CT  scan and examination.  She was taken to the operating room on July 24, 2007.  Postoperatively, she had episodes of nausea.  She was able to be  started on Toradol on postoperative day 2. She was begun on Zosyn while  in the hospital.  On day 2 of  hospital course, the day of discharge,  she was eating a regular diet, having pain well controlled with Vicodin  as well as Toradol.  I had a discussion with the patient today.  I  instructed her not to perform any heavy lifting greater than 20 pounds.  We gave her Vicodin 5/325 every 4 hours as needed for pain.  She also  was given prescription for Toradol every 4-6 hours for pain, not to  increase 40 mg and not to take more than 5 days, #30 was dispensed.  I  gave her 25 of Phenergan 0.5 to 1 tab every 4 hours for nausea.  She was  instructed that she can start taking ibuprofen or Motrin once she is  finished with her Toradol and should be on stool softener of choice.  She will see Dr. Ezzard Standing in approximately 2 to 3 weeks.      Lennie Muckle, MD  Electronically Signed     ALA/MEDQ  D:  07/25/2007  T:  07/26/2007  Job:  [id number]

## 2010-06-05 NOTE — Op Note (Signed)
NAMEGARRY, Marsh            ACCOUNT NO.:  [id number]   MEDICAL RECORD NO.:  [id number]          PATIENT TYPE:  AMB   LOCATION:  DSC                          FACILITY:  [facility]   PHYSICIAN:  Katy Fitch. Sypher, M.D. DATE OF BIRTH:  1945-05-21   DATE OF PROCEDURE:  11/04/2005  DATE OF DISCHARGE:                                 OPERATIVE REPORT   PREOPERATIVE DIAGNOSES:  1. Chronic entrapment neuropathy, right median nerve at carpal tunnel.  2. Right long finger stenosing tenosynovitis.  3. Right ring finger stenosing tenosynovitis.  4. Left long finger stenosing tenosynovitis.   POSTOPERATIVE DIAGNOSES:  1. Chronic entrapment neuropathy, right median nerve at carpal tunnel.  2. Right long finger stenosing tenosynovitis.  3. Right ring finger stenosing tenosynovitis.  4. Left long finger stenosing tenosynovitis.   OPERATION:  1. Release of right transverse carpal ligament.  2. Release of right long finger A1 pulley.  3. Release of right ring finger A1 pulley.  4. Release of left long finger A1 pulley.   OPERATING SURGEON:  Katy Fitch. Sypher, M.D.   ASSISTANT:  Brenda Maduro Dasnoit PA-C.   ANESTHESIA:  General by LMA, supervising anesthesiologist is Dr. Krista Blue.   INDICATIONS:  Brenda Marsh is a 65 year old right-handed woman who is  referred through the courtesy of Dr. Sharolyn Douglas for evaluation and management  of hand numbness on the right and multiple trigger fingers.  She had had a  prior left carpal tunnel release by Dr. Amanda Pea several years prior.  She has  type 2 diabetes and had been troubled by her stenosing tenosynovitis and  numbness.   Clinical examination confirmed significant right carpal tunnel syndrome.  She had stenosing synovitis of her right long and ring fingers and her left  long finger.   After informed consent, she is brought to the operating room at this time.   PROCEDURE:  Brenda Marsh is brought to the operating room and placed in  supine  position upon the operating table.   Following induction of general anesthesia under Dr. Robina Ade direct  supervision, the right arm and left arm were prepped with Betadine soap and  solution, sterilely draped.  A pneumatic tourniquet was applied to the right  forearm due to IV access tissues and the left proximal brachium.  IV access  was deferred in the left arm due to prior history of mastectomy.   The arms were prepped with Betadine soap and solution and sterilely draped.   Following exsanguination of the right arm with an Esmarch bandage, an  arterial tourniquet on the proximal forearm was inflated to 230 mmHg.  The  procedure commenced with a short transverse incision in the distal palmar  crease, exposing the A1 pulleys of right long and right ring fingers.  After  retraction was carefully placed, pulleys were isolated, cleared of soft  tissue with a Freer, and released with a scalpel and scissors.  The flexor  tendons of the long and ring fingers were delivered, found to be otherwise  normal.  Full passive range of motion of the fingers was recovered.  The  wound was then repaired  with intradermal 3-0 Prolene suture.  Attention was  then directed to the right palm.  The transverse carpal ligament was  approached through a small palmar incision in the line of the ring finger.  Subcutaneous tissues were carefully divided revealing the palmar fascia.  This was split longitudinally to reveal the common sensory branches of the  median nerve.  These were followed back to the median nerve proper, which  was gently isolated from the transverse carpal ligament utilizing a Scientist, clinical (histocompatibility and immunogenetics).  The transverse carpal ligament was released along its ulnar  border extending into the distal forearm.  Due to the presence of a  tourniquet of the forearm, visualization in the distal forearm was  challenging.  Brenda Marsh is moderately obese and between adipose tissue and  the presence of the  tourniquet, safe visualization of the median nerve and  flexor tendon musculotendinous junction was not possible.  Therefore, a  short transverse scission was fashioned in the proximal wrist flexion  crease.  The fascia was isolated and released subcutaneously.   The nerve was fully decompressed.  The ulnar bursa was noted be edematous  and hyperemic.  The wounds were repaired with intradermal 3-0 Prolene.  The  tourniquet released on the right side.  A dressing was applied with Steri-  Strips, sterile gauze, sterile Webril, and a volar plaster splint  maintaining the wrist in 5 degrees of dorsiflexion.   Attention was then directed to the left hand.  After exsanguination of the  left arm with an Esmarch bandage, an arterial tourniquet on the proximal  left brachium inflated to 230 mmHg.  The procedure commenced with a short  transverse incision directly over the A1 pulley of the long finger.  Subcutaneous tissues were carefully divided taking care to release the  palmar fascia.  The pulley was split along its radial border.  The tendons  were delivered and thereafter full passive motion of the finger recovered.   This wound was repaired with intradermal 3-0 Prolene.  A compressive  dressing was applied with Xeroflo, sterile gauze and Ace wrap.  There were  no apparent complications.  Brenda Marsh was awakened from general  anesthesia, transferred to the recovery room with stable vital signs.  She  will be discharged home with a prescription for Vicodin 5 mg one p.o. q.4-  6h. p.r.n. pain, 20 tablets without refill; also doxycycline 100 mg one p.o.  b.i.d. x5 days as a prophylactic antibiotic.      Katy Fitch Sypher, M.D.  Electronically Signed     RVS/MEDQ  D:  11/04/2005  T:  11/05/2005  Job:  [id number]   cc:   Sharolyn Douglas, M.D.

## 2010-08-17 ENCOUNTER — Encounter: Payer: Self-pay | Admitting: Internal Medicine

## 2010-09-28 ENCOUNTER — Ambulatory Visit: Payer: Medicare Other | Admitting: Internal Medicine

## 2010-09-28 ENCOUNTER — Ambulatory Visit (INDEPENDENT_AMBULATORY_CARE_PROVIDER_SITE_OTHER): Payer: Medicare Other | Admitting: Internal Medicine

## 2010-09-28 ENCOUNTER — Encounter: Payer: Self-pay | Admitting: Internal Medicine

## 2010-09-28 DIAGNOSIS — E785 Hyperlipidemia, unspecified: Secondary | ICD-10-CM

## 2010-09-28 DIAGNOSIS — E119 Type 2 diabetes mellitus without complications: Secondary | ICD-10-CM

## 2010-09-28 DIAGNOSIS — Z860100 Personal history of colon polyps, unspecified: Secondary | ICD-10-CM

## 2010-09-28 DIAGNOSIS — I10 Essential (primary) hypertension: Secondary | ICD-10-CM

## 2010-09-28 DIAGNOSIS — R7309 Other abnormal glucose: Secondary | ICD-10-CM

## 2010-09-28 LAB — LIPID PANEL
HDL: 44 mg/dL (ref >39)
LDL Cholesterol: 227 mg/dL — ABNORMAL HIGH (ref 0–99)
Total CHOL/HDL Ratio: 6.7 Ratio
VLDL: 25 mg/dL (ref 0–40)

## 2010-09-28 LAB — BASIC METABOLIC PANEL
Calcium: 9.5 mg/dL (ref 8.4–10.5)
Chloride: 104 mEq/L (ref 96–112)
Creat: 0.73 mg/dL (ref 0.50–1.10)

## 2010-09-28 LAB — HEMOGLOBIN A1C: Mean Plasma Glucose: 117 mg/dL — ABNORMAL HIGH (ref <117)

## 2010-09-28 NOTE — Patient Instructions (Signed)
Please complete record release for your last colonoscopy report (Dr. Nedra Hai) Please schedule cbc, chem7, a1c, urine microalbumin 250.0 and lipid 272.4 prior to next visit

## 2010-09-29 ENCOUNTER — Telehealth: Payer: Self-pay | Admitting: Internal Medicine

## 2010-09-29 MED ORDER — CARVEDILOL 6.25 MG PO TABS: 6.2500 mg | ORAL_TABLET | Freq: Two times a day (BID) | ORAL | Status: DC

## 2010-09-29 NOTE — Telephone Encounter (Signed)
Refill- carvedilol 6.25mg  tab teva. Take one tablet twice a day. Qty 180. Last fill 6.8.12

## 2010-09-29 NOTE — Telephone Encounter (Signed)
Rx refill sent to pharmacy. 

## 2010-09-30 ENCOUNTER — Other Ambulatory Visit: Payer: Self-pay | Admitting: Internal Medicine

## 2010-09-30 DIAGNOSIS — E785 Hyperlipidemia, unspecified: Secondary | ICD-10-CM

## 2010-10-04 NOTE — Assessment & Plan Note (Signed)
Obtain chem7, a1c 

## 2010-10-04 NOTE — Progress Notes (Signed)
  Subjective:    Patient ID: Brenda Marsh, female    DOB: Feb 22, 1945, 65 y.o.   MRN: [id number]  HPI Pt presents to clinic for followup of multiple medical problems. H/o hyperlipidemia previously intolerant of crestor/zocor. Did not take livalo. Declines further statin therapy. Reviewed ldl 213. H/o breast ca and mammogram utd 3/12. BP reviewed normotensive. Declines pneumovax and flu vaccine. H/o colon polyp-last colonoscopy report unavailable. No active complaint.  Past Medical History  Diagnosis Date  . Nonischemic cardiomyopathy     improved by most recent echocardiogram  . LBBB (left bundle branch block)   . Cancer 2009  . Colon polyp     unclear pathology  . Hyperlipidemia   . Hypertension   . Arthritis     left knee pain, MRI in 2008-tricompartmental  degenerate changes, mucoid degeneration of ACL, post horn meniscal tear and ant horn meniscal tear  . Low back pain     left L3-4 injected   Past Surgical History  Procedure Date  . Appendectomy 2009    Dr. Ezzard Standing  . Carpal tunnel release 2007    Dr. Teressa Senter  . Abdominal hysterectomy   . Breast surgery     lumpectomy  . Tonsillectomy   . Knee arthroscopy     Right    reports that she quit smoking about 40 years ago. She has never used smokeless tobacco. She reports that she does not drink alcohol. Her drug history not on file. family history includes Cancer in her other; Coronary artery disease in her other; Diabetes in her brother and sister; Heart attack (age of onset:72) in her mother; Hyperlipidemia in her other; and Hypertension in her other. Allergies  Allergen Reactions  . Lisinopril     REACTION: cough  . Naproxen Sodium     REACTION: Breathing difficulty  . Penicillins     REACTION: Welps  . Simvastatin     REACTION: muscle aches     Review of Systems see hpi     Objective:   Physical Exam  Physical Exam  Nursing note and vitals reviewed. Constitutional: Appears well-developed and  well-nourished. No distress.  HENT:  Head: Normocephalic and atraumatic.  Right Ear: External ear normal.  Left Ear: External ear normal.  Eyes: Conjunctivae are normal. No scleral icterus.  Neck: Neck supple. Carotid bruit is not present.  Cardiovascular: Normal rate, regular rhythm and normal heart sounds.  Exam reveals no gallop and no friction rub.   No murmur heard. Pulmonary/Chest: Effort normal and breath sounds normal. No respiratory distress. He has no wheezes. no rales.  Lymphadenopathy:    He has no cervical adenopathy.  Neurological:Alert.  Skin: Skin is warm and dry. Not diaphoretic.  Psychiatric: Has a normal mood and affect.        Assessment & Plan:

## 2010-10-04 NOTE — Assessment & Plan Note (Signed)
Obtain lipid. Declines statin tx. Low fat diet and exercise.

## 2010-10-04 NOTE — Assessment & Plan Note (Signed)
Obtain last colonoscopy report

## 2010-10-04 NOTE — Assessment & Plan Note (Signed)
Normotensive and stable. Continue current regimen. Monitor bp as outpt and followup in clinic as scheduled.  

## 2010-10-05 ENCOUNTER — Telehealth: Payer: Self-pay | Admitting: Internal Medicine

## 2010-10-05 NOTE — Telephone Encounter (Signed)
Received medical records from [facility]-Dr. Yates Decamp

## 2010-10-05 NOTE — Progress Notes (Signed)
no

## 2010-10-15 LAB — URINALYSIS, ROUTINE W REFLEX MICROSCOPIC
Bilirubin Urine: NEGATIVE
Glucose, UA: NEGATIVE
Protein, ur: NEGATIVE
Specific Gravity, Urine: 1.008
Urobilinogen, UA: 0.2

## 2010-10-15 LAB — DIFFERENTIAL
Basophils Absolute: 0.1
Eosinophils Absolute: 0.2
Eosinophils Relative: 2
Lymphocytes Relative: 30
Monocytes Absolute: 0.6

## 2010-10-15 LAB — COMPREHENSIVE METABOLIC PANEL
ALT: 22
AST: 34
Albumin: 4.4
CO2: 28
Chloride: 101
Creatinine, Ser: 0.6
GFR calc Af Amer: >60
GFR calc non Af Amer: >60
Potassium: 4
Sodium: 140
Total Bilirubin: 0.4

## 2010-10-15 LAB — CBC
MCV: 88.4
Platelets: 253
RBC: 3.97
WBC: 11.3 — ABNORMAL HIGH

## 2010-10-15 LAB — URINE MICROSCOPIC-ADD ON

## 2010-10-22 ENCOUNTER — Ambulatory Visit: Payer: Medicare Other | Admitting: Internal Medicine

## 2010-10-23 ENCOUNTER — Ambulatory Visit (INDEPENDENT_AMBULATORY_CARE_PROVIDER_SITE_OTHER): Payer: Medicare Other | Admitting: Internal Medicine

## 2010-10-23 ENCOUNTER — Ambulatory Visit (HOSPITAL_BASED_OUTPATIENT_CLINIC_OR_DEPARTMENT_OTHER)
Admission: RE | Admit: 2010-10-23 | Discharge: 2010-10-23 | Disposition: A | Discharge status: Home / Self Care | Payer: Medicare Other | Source: Ambulatory Visit | Attending: Internal Medicine | Admitting: Internal Medicine

## 2010-10-23 ENCOUNTER — Encounter: Payer: Self-pay | Admitting: Internal Medicine

## 2010-10-23 VITALS — BP 120/68 | HR 80 | Temp 98.2°F | Resp 18 | Wt 220.0 lb

## 2010-10-23 DIAGNOSIS — M47812 Spondylosis without myelopathy or radiculopathy, cervical region: Secondary | ICD-10-CM

## 2010-10-23 DIAGNOSIS — M2569 Stiffness of other specified joint, not elsewhere classified: Secondary | ICD-10-CM | POA: Insufficient documentation

## 2010-10-23 DIAGNOSIS — J069 Acute upper respiratory infection, unspecified: Secondary | ICD-10-CM | POA: Insufficient documentation

## 2010-10-23 DIAGNOSIS — M542 Cervicalgia: Secondary | ICD-10-CM | POA: Insufficient documentation

## 2010-10-23 MED ORDER — AZITHROMYCIN 250 MG PO TABS: ORAL_TABLET | ORAL | Status: AC

## 2010-10-23 MED ORDER — CYCLOBENZAPRINE HCL 10 MG PO TABS: 10.0000 mg | ORAL_TABLET | Freq: Three times a day (TID) | ORAL | Status: DC | PRN

## 2010-10-23 NOTE — Assessment & Plan Note (Signed)
Given abx to hold. Begin abx to completion if sx's no better within 48 hours. Followup if no improvement or worsening.

## 2010-10-23 NOTE — Progress Notes (Signed)
  Subjective:    Patient ID: Brenda Marsh, female    DOB: 1945/01/21, 65 y.o.   MRN: [id number]  HPI Pt presents to clinic for evaluation of neck pain. Notes 4 day h/o posterior neck pain right>left without radicular pain, weakness of arms or paresthesia. Saw chiropractor who deferred treatment and recommended radiographic evaluation due to h/o breast cancer. No injury or trauma but sx's began after URI with cough. URI sx's x 1.5 wk and cough now resolved. Has rhinnorhea and congestion. Feels like improving but not resolved. No other complaints.  Past Medical History  Diagnosis Date  . Nonischemic cardiomyopathy     improved by most recent echocardiogram  . LBBB (left bundle branch block)   . Cancer 2009  . Colon polyp     unclear pathology  . Hyperlipidemia   . Hypertension   . Arthritis     left knee pain, MRI in 2008-tricompartmental  degenerate changes, mucoid degeneration of ACL, post horn meniscal tear and ant horn meniscal tear  . Low back pain     left L3-4 injected   Past Surgical History  Procedure Date  . Appendectomy 2009    Dr. Ezzard Standing  . Carpal tunnel release 2007    Dr. Teressa Senter  . Abdominal hysterectomy   . Breast surgery     lumpectomy  . Tonsillectomy   . Knee arthroscopy     Right    reports that she quit smoking about 40 years ago. She has never used smokeless tobacco. She reports that she does not drink alcohol. Her drug history not on file. family history includes Cancer in her other; Coronary artery disease in her other; Diabetes in her brother and sister; Heart attack (age of onset:72) in her mother; Hyperlipidemia in her other; and Hypertension in her other. Allergies  Allergen Reactions  . Lisinopril     REACTION: cough  . Naproxen Sodium     REACTION: Breathing difficulty  . Penicillins     REACTION: Welps  . Simvastatin     REACTION: muscle aches       Review of Systems see hpi    Objective:   Physical Exam  Nursing note and vitals  reviewed. Constitutional: She appears well-developed and well-nourished. No distress.  HENT:  Head: Normocephalic and atraumatic.  Right Ear: External ear normal.  Left Ear: External ear normal.  Eyes: Conjunctivae are normal. No scleral icterus.  Musculoskeletal:       No midline cervical tenderness or bony abn. Right>left paraspinal muscle tenderness and spasm.  Neurological: She is alert.  Skin: Skin is warm and dry. She is not diaphoretic.  Psychiatric: She has a normal mood and affect.          Assessment & Plan:

## 2010-10-23 NOTE — Assessment & Plan Note (Signed)
Obtain c spine radiograph. Take otc advil prn (states can tolerate without difficulty). Use topical heat prn. Attempt flexeril prn and cautioned regarding possible sedating effect. Followup if no improvement or worsening.

## 2010-10-26 ENCOUNTER — Other Ambulatory Visit: Payer: Self-pay | Admitting: Internal Medicine

## 2010-12-15 ENCOUNTER — Other Ambulatory Visit: Payer: Self-pay | Admitting: *Deleted

## 2010-12-15 MED ORDER — CANDESARTAN CILEXETIL 16 MG PO TABS: 16.0000 mg | ORAL_TABLET | Freq: Every day | ORAL | Status: DC

## 2010-12-15 NOTE — Telephone Encounter (Signed)
Patient called and left voice message requesting a refill on Atacand and Carvedilol.  Call returned to patient at [phone], no answer. A voice message was left informing patient Rx for Carvedilol should be on file at pharmacy it was refilled for 90 day supply in September. She was advised to call back if Rx was not on file. She was informed Rx for Atacand was sent to pharmacy.

## 2011-01-15 LAB — LIPID PANEL
Cholesterol: 279 mg/dL — ABNORMAL HIGH (ref 0–200)
HDL: 46 mg/dL (ref >39)
Triglycerides: 116 mg/dL (ref <150)

## 2011-01-15 LAB — HEMOGLOBIN A1C: Hgb A1c MFr Bld: 5.6 % (ref <5.7)

## 2011-01-15 NOTE — Progress Notes (Signed)
Addended by: Mervin Kung A on: 01/15/2011 10:13 AM   Modules accepted: Orders

## 2011-01-16 LAB — CBC
HCT: 36.8 % (ref 36.0–46.0)
Hemoglobin: 11.9 g/dL — ABNORMAL LOW (ref 12.0–15.0)
RBC: 4 MIL/uL (ref 3.87–5.11)
WBC: 5.7 10*3/uL (ref 4.0–10.5)

## 2011-01-16 LAB — BASIC METABOLIC PANEL
BUN: 10 mg/dL (ref 6–23)
CO2: 28 mEq/L (ref 19–32)
Chloride: 103 mEq/L (ref 96–112)
Glucose, Bld: 87 mg/dL (ref 70–99)
Potassium: 3.7 mEq/L (ref 3.5–5.3)
Sodium: 141 mEq/L (ref 135–145)

## 2011-01-16 LAB — MICROALBUMIN / CREATININE URINE RATIO
Creatinine, Urine: 225.4 mg/dL
Microalb Creat Ratio: 5.4 mg/g (ref 0.0–30.0)

## 2011-01-25 ENCOUNTER — Ambulatory Visit: Payer: Medicare Other | Admitting: Internal Medicine

## 2011-01-26 ENCOUNTER — Encounter: Payer: Self-pay | Admitting: Internal Medicine

## 2011-01-26 ENCOUNTER — Ambulatory Visit (INDEPENDENT_AMBULATORY_CARE_PROVIDER_SITE_OTHER): Payer: Medicare Other | Admitting: Internal Medicine

## 2011-01-26 VITALS — BP 100/56 | HR 82 | Temp 98.1°F | Resp 20 | Wt 215.0 lb

## 2011-01-26 DIAGNOSIS — E785 Hyperlipidemia, unspecified: Secondary | ICD-10-CM

## 2011-01-26 DIAGNOSIS — M542 Cervicalgia: Secondary | ICD-10-CM

## 2011-01-26 NOTE — Progress Notes (Signed)
  Subjective:    Patient ID: Brenda Marsh, female    DOB: 20-Mar-1945, 66 y.o.   MRN: [id number]  HPI Pt presents to clinic for followup of multiple medical problems. Posterior neck pain improved but persistent daily. Described as mild without radicular sx's. Reviewed severe hyperlipidemia and declines further statin use. Eating oatmeal and couldn't tolerate red yeast extract. DM under excellent control with a1c 5.6. Plans to discuss BMD at H/O followup. No other complaints.  Past Medical History  Diagnosis Date  . Nonischemic cardiomyopathy     improved by most recent echocardiogram  . LBBB (left bundle branch block)   . Cancer 2009  . Colon polyp     unclear pathology  . Hyperlipidemia   . Hypertension   . Arthritis     left knee pain, MRI in 2008-tricompartmental  degenerate changes, mucoid degeneration of ACL, post horn meniscal tear and ant horn meniscal tear  . Low back pain     left L3-4 injected   Past Surgical History  Procedure Date  . Appendectomy 2009    Dr. Ezzard Standing  . Carpal tunnel release 2007    Dr. Teressa Senter  . Abdominal hysterectomy   . Breast surgery     lumpectomy  . Tonsillectomy   . Knee arthroscopy     Right    reports that she quit smoking about 41 years ago. She has never used smokeless tobacco. She reports that she does not drink alcohol. Her drug history not on file. family history includes Cancer in her other; Coronary artery disease in her other; Diabetes in her brother and sister; Heart attack (age of onset:72) in her mother; Hyperlipidemia in her other; and Hypertension in her other. Allergies  Allergen Reactions  . Lisinopril     REACTION: cough  . Naproxen Sodium     REACTION: Breathing difficulty  . Penicillins     REACTION: Welps  . Simvastatin     REACTION: muscle aches      Review of Systems see hpi     Objective:   Physical Exam  Nursing note and vitals reviewed. Constitutional: She appears well-developed and well-nourished.  No distress.  HENT:  Head: Normocephalic and atraumatic.  Right Ear: External ear normal.  Left Ear: External ear normal.  Eyes: Conjunctivae are normal. No scleral icterus.  Neck: Neck supple.  Cardiovascular: Normal rate, regular rhythm and normal heart sounds.   Pulmonary/Chest: Effort normal and breath sounds normal. No respiratory distress. She has no wheezes. She has no rales.  Musculoskeletal:       FROM neck. No posterior neck tenderness or spasm  Neurological: She is alert.  Skin: Skin is warm and dry. She is not diaphoretic.  Psychiatric: She has a normal mood and affect.          Assessment & Plan:

## 2011-01-26 NOTE — Assessment & Plan Note (Signed)
Reviewed significant elevations of cholesterol. Understands elevated risk for CAD. Declines statin therapy. Recheck lipid/lft prior to next visit

## 2011-01-26 NOTE — Assessment & Plan Note (Signed)
Refer for physical therapy  

## 2011-01-26 NOTE — Patient Instructions (Signed)
Please schedule cbc(anemia), chem7(v58.69) and lipid/lft (272.4) prior to next visit Also please schedule physical therapy for your neck

## 2011-02-08 ENCOUNTER — Ambulatory Visit: Payer: Medicare Other | Attending: Internal Medicine | Admitting: Physical Therapy

## 2011-02-08 DIAGNOSIS — IMO0001 Reserved for inherently not codable concepts without codable children: Secondary | ICD-10-CM | POA: Insufficient documentation

## 2011-02-08 DIAGNOSIS — M542 Cervicalgia: Secondary | ICD-10-CM | POA: Insufficient documentation

## 2011-02-12 ENCOUNTER — Ambulatory Visit: Payer: Medicare Other | Admitting: Physical Therapy

## 2011-02-15 ENCOUNTER — Ambulatory Visit: Payer: Medicare Other | Admitting: Physical Therapy

## 2011-02-18 ENCOUNTER — Ambulatory Visit: Payer: Medicare Other | Admitting: Physical Therapy

## 2011-02-22 ENCOUNTER — Ambulatory Visit: Payer: Medicare Other | Attending: Internal Medicine | Admitting: Physical Therapy

## 2011-02-22 DIAGNOSIS — M542 Cervicalgia: Secondary | ICD-10-CM | POA: Insufficient documentation

## 2011-02-22 DIAGNOSIS — IMO0001 Reserved for inherently not codable concepts without codable children: Secondary | ICD-10-CM | POA: Insufficient documentation

## 2011-02-26 ENCOUNTER — Ambulatory Visit: Payer: Medicare Other | Admitting: Physical Therapy

## 2011-03-01 ENCOUNTER — Ambulatory Visit: Payer: Medicare Other | Admitting: Physical Therapy

## 2011-03-04 ENCOUNTER — Ambulatory Visit: Payer: Medicare Other | Admitting: Physical Therapy

## 2011-03-09 ENCOUNTER — Ambulatory Visit: Payer: Medicare Other | Admitting: Physical Therapy

## 2011-03-12 ENCOUNTER — Ambulatory Visit: Payer: Medicare Other | Admitting: Physical Therapy

## 2011-03-15 ENCOUNTER — Ambulatory Visit: Payer: Medicare Other | Admitting: Physical Therapy

## 2011-03-19 ENCOUNTER — Ambulatory Visit: Payer: Medicare Other | Attending: Internal Medicine | Admitting: Physical Therapy

## 2011-03-19 DIAGNOSIS — M542 Cervicalgia: Secondary | ICD-10-CM | POA: Insufficient documentation

## 2011-03-19 DIAGNOSIS — IMO0001 Reserved for inherently not codable concepts without codable children: Secondary | ICD-10-CM | POA: Insufficient documentation

## 2011-03-22 ENCOUNTER — Ambulatory Visit: Payer: Medicare Other | Admitting: Physical Therapy

## 2011-03-29 ENCOUNTER — Ambulatory Visit: Payer: Medicare Other | Admitting: Physical Therapy

## 2011-03-29 ENCOUNTER — Ambulatory Visit (HOSPITAL_BASED_OUTPATIENT_CLINIC_OR_DEPARTMENT_OTHER): Payer: Medicare Other | Admitting: Hematology & Oncology

## 2011-03-29 ENCOUNTER — Telehealth: Payer: Self-pay | Admitting: Hematology & Oncology

## 2011-03-29 VITALS — BP 126/71 | HR 76 | Temp 97.5°F | Wt 213.0 lb

## 2011-03-29 DIAGNOSIS — Z853 Personal history of malignant neoplasm of breast: Secondary | ICD-10-CM

## 2011-03-29 DIAGNOSIS — C50919 Malignant neoplasm of unspecified site of unspecified female breast: Secondary | ICD-10-CM

## 2011-03-29 NOTE — Telephone Encounter (Signed)
Pt made 4-29 and 5-8 appointments she has furniture market plans and cant miss it

## 2011-03-29 NOTE — Progress Notes (Signed)
This office note has been dictated.

## 2011-03-29 NOTE — Progress Notes (Signed)
CC:   Brenda Marsh. 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  CURRENT THERAPY:  Observation.  INTERVAL HISTORY:  Brenda Marsh comes in for followup.  Her neck is giving her a lot of problems now.  She has seen Physical Therapy downstairs.  She had some x-rays done which showed some degenerative changes.  There is no evidence of metastatic disease.  She says that her physical therapy is helping her a little bit.  Otherwise, she is doing well.  She is losing weight.  She is happy about this.  The weight loss is helping her back.  She has had no problems with nausea or vomiting.  There has been no change in bowel or bladder habits.  Her last mammogram was done back in April 2012.  The mammogram looked okay with no evidence of recurrent or new breast cancer.  She has not had a bone density test in a of couple years.  She needs to have a bone density test done this year.  She has had no leg swelling.  There has been no cardiac difficulties. She has had some heart issues in the past.  She says that there has been no change in her medications.  PHYSICAL EXAM:  General: This is a mildly obese black female in no obvious distress.  Vital signs: Temperature  97./5, pulse 76, respiratory 18, blood pressure 126/71, and weight is 213.  Head and neck exam shows a normocephalic, atraumatic skull.  There are no ocular or oral lesions.  There are no palpable cervical or supraclavicular lymph nodes.  Lungs are clear bilaterally.  Cardiac examination:  Regular rhythm with a normal S1, S2.  There are no murmurs, rubs, or bruits. Abdominal exam: Soft with good bowel sounds.  She is mildly obese.  She has no fluid wave.  There is no palpable hepatosplenomegaly.  Breast exam: Shows right breast with no masses, edema, or erythema.  There is no right axillary adenopathy.  Left breast shows a well-healed lumpectomy at the 2 o'clock position.  There is some contraction  from radiation.  There is a slight firmness from past radiation.  No distinct masses noted in the left breast.  There is no left axillary adenopathy. Back exam: Shows no tenderness over the spine, ribs, or hips. Extremities: Shows lymphedema of the left arm.  This is chronic and stable.  There is no erythema noted.  She has good range motion of her joints.  LABORATORY STUDIES:  Were not done this visit.  IMPRESSION:  Brenda Marsh is a 66 year old African female with a past history of stage II IIA infiltrating ductal carcinoma of the left breast.  She had 1 positive lymph node.  I see no evidence of recurrence.  She has had her neck looked at and so far there has been no evidence of metastatic disease.  We will continue to follow her yearly now.  We will make sure that her bone density and mammogram get done next month.    ______________________________ Josph Macho, M.D. PRE/MEDQ  D:  03/29/2011  T:  03/29/2011  Job:  [id number]

## 2011-04-01 ENCOUNTER — Telehealth: Payer: Self-pay | Admitting: Internal Medicine

## 2011-04-01 MED ORDER — HYDROCHLOROTHIAZIDE 25 MG PO TABS: 25.0000 mg | ORAL_TABLET | Freq: Every day | ORAL | Status: DC

## 2011-04-01 MED ORDER — CARVEDILOL 6.25 MG PO TABS: 6.2500 mg | ORAL_TABLET | Freq: Two times a day (BID) | ORAL | Status: DC

## 2011-04-01 NOTE — Telephone Encounter (Signed)
Patient states that she called pharmacy for refill request and pharmacy told her to call us.   She needs refills on carvedilol and hydrochlorothiazide. Please send to [facility] on [address].

## 2011-04-01 NOTE — Telephone Encounter (Signed)
Rx refills sent to pharmacy. 

## 2011-04-02 ENCOUNTER — Ambulatory Visit: Payer: Medicare Other | Admitting: Physical Therapy

## 2011-04-06 ENCOUNTER — Ambulatory Visit: Payer: Medicare Other | Admitting: Physical Therapy

## 2011-04-09 ENCOUNTER — Ambulatory Visit: Payer: Medicare Other | Admitting: Physical Therapy

## 2011-04-12 ENCOUNTER — Ambulatory Visit: Payer: Medicare Other | Admitting: Physical Therapy

## 2011-04-15 ENCOUNTER — Ambulatory Visit: Payer: Medicare Other | Admitting: Physical Therapy

## 2011-04-16 ENCOUNTER — Encounter: Payer: Medicare Other | Admitting: Physical Therapy

## 2011-04-19 ENCOUNTER — Ambulatory Visit: Payer: Medicare Other | Attending: Internal Medicine | Admitting: Physical Therapy

## 2011-04-19 DIAGNOSIS — IMO0001 Reserved for inherently not codable concepts without codable children: Secondary | ICD-10-CM | POA: Insufficient documentation

## 2011-04-19 DIAGNOSIS — M542 Cervicalgia: Secondary | ICD-10-CM | POA: Insufficient documentation

## 2011-04-23 ENCOUNTER — Ambulatory Visit: Payer: Medicare Other | Admitting: Physical Therapy

## 2011-04-27 ENCOUNTER — Telehealth: Payer: Self-pay | Admitting: Internal Medicine

## 2011-04-27 MED ORDER — CANDESARTAN CILEXETIL 16 MG PO TABS: 16.0000 mg | ORAL_TABLET | Freq: Every day | ORAL | Status: DC

## 2011-04-27 NOTE — Telephone Encounter (Signed)
Rx refill sent to pharmacy. 

## 2011-05-17 ENCOUNTER — Ambulatory Visit
Admission: RE | Admit: 2011-05-17 | Discharge: 2011-05-17 | Disposition: A | Discharge status: Home / Self Care | Payer: Medicare Other | Source: Ambulatory Visit | Attending: Hematology & Oncology | Admitting: Hematology & Oncology

## 2011-05-17 DIAGNOSIS — Z780 Asymptomatic menopausal state: Secondary | ICD-10-CM

## 2011-05-17 DIAGNOSIS — C50919 Malignant neoplasm of unspecified site of unspecified female breast: Secondary | ICD-10-CM

## 2011-05-19 ENCOUNTER — Other Ambulatory Visit: Payer: Self-pay | Admitting: Internal Medicine

## 2011-05-19 DIAGNOSIS — D649 Anemia, unspecified: Secondary | ICD-10-CM

## 2011-05-19 DIAGNOSIS — E785 Hyperlipidemia, unspecified: Secondary | ICD-10-CM

## 2011-05-19 DIAGNOSIS — Z79899 Other long term (current) drug therapy: Secondary | ICD-10-CM

## 2011-05-19 LAB — CBC WITH DIFFERENTIAL/PLATELET
Eosinophils Absolute: 0.4 10*3/uL (ref 0.0–0.7)
Eosinophils Relative: 6 % — ABNORMAL HIGH (ref 0–5)
HCT: 38.4 % (ref 36.0–46.0)
Lymphs Abs: 2.9 10*3/uL (ref 0.7–4.0)
MCH: 29.9 pg (ref 26.0–34.0)
MCV: 90.4 fL (ref 78.0–100.0)
Monocytes Absolute: 0.4 10*3/uL (ref 0.1–1.0)
Platelets: 289 10*3/uL (ref 150–400)
RDW: 14.6 % (ref 11.5–15.5)

## 2011-05-19 LAB — HEPATIC FUNCTION PANEL
Albumin: 4.2 g/dL (ref 3.5–5.2)
Total Bilirubin: 0.4 mg/dL (ref 0.3–1.2)

## 2011-05-19 LAB — BASIC METABOLIC PANEL
BUN: 12 mg/dL (ref 6–23)
Calcium: 9.8 mg/dL (ref 8.4–10.5)
Creat: 0.76 mg/dL (ref 0.50–1.10)
Glucose, Bld: 77 mg/dL (ref 70–99)

## 2011-05-21 LAB — LIPID PANEL

## 2011-05-24 ENCOUNTER — Ambulatory Visit (INDEPENDENT_AMBULATORY_CARE_PROVIDER_SITE_OTHER): Payer: Medicare Other | Admitting: Internal Medicine

## 2011-05-24 ENCOUNTER — Encounter: Payer: Self-pay | Admitting: Internal Medicine

## 2011-05-24 ENCOUNTER — Telehealth: Payer: Self-pay | Admitting: Internal Medicine

## 2011-05-24 VITALS — BP 114/70 | HR 80 | Temp 97.9°F | Resp 16 | Ht 65.5 in | Wt 212.0 lb

## 2011-05-24 DIAGNOSIS — R7309 Other abnormal glucose: Secondary | ICD-10-CM

## 2011-05-24 DIAGNOSIS — E785 Hyperlipidemia, unspecified: Secondary | ICD-10-CM

## 2011-05-24 DIAGNOSIS — I10 Essential (primary) hypertension: Secondary | ICD-10-CM

## 2011-05-24 NOTE — Assessment & Plan Note (Signed)
Normotensive and stable. Continue current regimen. Monitor bp as outpt and followup in clinic as scheduled. Wt loss encouraged.

## 2011-05-24 NOTE — Patient Instructions (Signed)
Please schedule fasting labs prior to next visit Lipid-272.4, chem7, a1c- 250.0

## 2011-05-24 NOTE — Assessment & Plan Note (Signed)
Stable. Obtain chem7 and a1c prior to next visit

## 2011-05-24 NOTE — Assessment & Plan Note (Signed)
Not interested in medication for elevated lipids. Low fat diet/exercise and wt loss. Obtain lipid profile prior to next visit

## 2011-05-24 NOTE — Progress Notes (Signed)
  Subjective:    Patient ID: Brenda Marsh, female    DOB: [birth year], 66 y.o.   MRN: [id number]  HPI Pt presents to clinic for followup of multiple medical problems. BP reviewed normotensive and has lost 3lbs since last visit. Lipid panel not peformed due to insufficient blood amount. Reviewed last lipid panel with pt with significant elevations. Not interested in further attempts at tx with medication. Continues follow up with H/O with h/o breast cancer. States posterior neck pain much improved.   Past Medical History  Diagnosis Date  . Nonischemic cardiomyopathy     improved by most recent echocardiogram  . LBBB (left bundle branch block)   . Cancer 2009  . Colon polyp     unclear pathology  . Hyperlipidemia   . Hypertension   . Arthritis     left knee pain, MRI in 2008-tricompartmental  degenerate changes, mucoid degeneration of ACL, post horn meniscal tear and ant horn meniscal tear  . Low back pain     left L3-4 injected   Past Surgical History  Procedure Date  . Appendectomy 2009    Dr. Ezzard Standing  . Carpal tunnel release 2007    Dr. Teressa Senter  . Abdominal hysterectomy   . Breast surgery     lumpectomy  . Tonsillectomy   . Knee arthroscopy     Right    reports that she quit smoking about 41 years ago. She has never used smokeless tobacco. She reports that she does not drink alcohol. Her drug history not on file. family history includes Cancer in her other; Coronary artery disease in her other; Diabetes in her brother and sister; Heart attack (age of onset:72) in her mother; Hyperlipidemia in her other; and Hypertension in her other. Allergies  Allergen Reactions  . Lisinopril     REACTION: cough  . Naproxen Sodium     REACTION: Breathing difficulty  . Penicillins     REACTION: Welps  . Simvastatin     REACTION: muscle aches      Review of Systems see hpi     Objective:   Physical Exam  Physical Exam  Nursing note and vitals reviewed. Constitutional:  Appears well-developed and well-nourished. No distress.  HENT:  Head: Normocephalic and atraumatic.  Right Ear: External ear normal.  Left Ear: External ear normal.  Eyes: Conjunctivae are normal. No scleral icterus.  Neck: Neck supple. Carotid bruit is not present.  Cardiovascular: Normal rate, regular rhythm and normal heart sounds.  Exam reveals no gallop and no friction rub.   No murmur heard. Pulmonary/Chest: Effort normal and breath sounds normal. No respiratory distress. He has no wheezes. no rales.  Lymphadenopathy:    He has no cervical adenopathy.  Neurological:Alert.  Skin: Skin is warm and dry. Not diaphoretic.  Psychiatric: Has a normal mood and affect.        Assessment & Plan:

## 2011-05-24 NOTE — Telephone Encounter (Signed)
Lab order entered for September of 2013.

## 2011-05-26 ENCOUNTER — Ambulatory Visit (HOSPITAL_BASED_OUTPATIENT_CLINIC_OR_DEPARTMENT_OTHER): Payer: Medicare Other | Admitting: Hematology & Oncology

## 2011-05-26 ENCOUNTER — Other Ambulatory Visit (HOSPITAL_BASED_OUTPATIENT_CLINIC_OR_DEPARTMENT_OTHER): Payer: Medicare Other | Admitting: Lab

## 2011-05-26 VITALS — BP 113/70 | HR 76 | Temp 97.1°F | Ht 65.0 in | Wt 213.0 lb

## 2011-05-26 DIAGNOSIS — M810 Age-related osteoporosis without current pathological fracture: Secondary | ICD-10-CM

## 2011-05-26 DIAGNOSIS — E785 Hyperlipidemia, unspecified: Secondary | ICD-10-CM

## 2011-05-26 DIAGNOSIS — Z853 Personal history of malignant neoplasm of breast: Secondary | ICD-10-CM

## 2011-05-26 DIAGNOSIS — C50919 Malignant neoplasm of unspecified site of unspecified female breast: Secondary | ICD-10-CM

## 2011-05-26 LAB — CBC WITH DIFFERENTIAL (CANCER CENTER ONLY)
BASO#: 0 10*3/uL (ref 0.0–0.2)
BASO%: 0.3 % (ref 0.0–2.0)
EOS%: 5.8 % (ref 0.0–7.0)
HCT: 35.8 % (ref 34.8–46.6)
LYMPH#: 3.5 10*3/uL — ABNORMAL HIGH (ref 0.9–3.3)
LYMPH%: 49.3 % — ABNORMAL HIGH (ref 14.0–48.0)
MCH: 30.8 pg (ref 26.0–34.0)
MCHC: 34.1 g/dL (ref 32.0–36.0)
MONO%: 7.9 % (ref 0.0–13.0)
NEUT%: 36.7 % — ABNORMAL LOW (ref 39.6–80.0)
RDW: 14.2 % (ref 11.1–15.7)

## 2011-05-26 LAB — COMPREHENSIVE METABOLIC PANEL
ALT: 19 U/L (ref 0–35)
AST: 22 U/L (ref 0–37)
Alkaline Phosphatase: 72 U/L (ref 39–117)
BUN: 11 mg/dL (ref 6–23)
Chloride: 100 mEq/L (ref 96–112)
Creatinine, Ser: 0.69 mg/dL (ref 0.50–1.10)
Total Bilirubin: 0.2 mg/dL — ABNORMAL LOW (ref 0.3–1.2)

## 2011-05-26 NOTE — Progress Notes (Signed)
This office note has been dictated.

## 2011-05-27 NOTE — Progress Notes (Signed)
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  CURRENT THERAPY:  Observation.  INTERVAL HISTORY:  Brenda Marsh comes in for followup.  We last saw her a month ago.  She is doing well.  Her neck is doing a whole lot better. She has had no problems with nausea or vomiting.  She has had no change in bowel or bladder habits.  She is trying to lose a little weight.  She did have a mammogram done back in April.  Mammogram did not show any evidence of recurrent disease or new calcifications.  She had a bone density test done, which showed no evidence of osteopenia or osteoporosis.  PHYSICAL EXAMINATION:  This is a moderately obese black female in no obvious distress.  Vital signs:  97.1, pulse 76, respiratory rate 18, blood pressure 113/70.  Weight is 213.  Head and neck:  Normocephalic, atraumatic skull.  There are no ocular or oral lesions.  There are no palpable cervical or supraclavicular lymph nodes.  Lungs:  Clear bilaterally.  Cardiac:  Regular rate and rhythm with a normal S1, S2. There are no murmurs, rubs or bruits.  Abdomen:  Soft with good bowel sounds.  There is no palpable abdominal mass.  There is no palpable hepatosplenomegaly.  Breasts:  Right breast with no masses, edema or erythema.  There is no right axillary adenopathy.  Left breast is slightly contracted from radiation.  She has a well-healed lumpectomy at the 1 o'clock position.  There is some firmness at the lumpectomy site. She has no distinct mass in the left breast.  There is no left axillary adenopathy.  Back:  No tenderness over the spine, ribs, or hips. Extremities:  No clubbing, cyanosis or edema.  She does have lymphedema in the left arm, which is chronic and stable.  Skin:  No rashes, ecchymoses or petechia.  LABORATORY STUDIES:  White cell count 7.1, hemoglobin 12.2, hematocrit 35.8, platelet count is 282.  IMPRESSION:  Brenda Marsh is a 66 year old black female  with history of stage IIA ductal carcinoma of the left breast.  She now is out over 10 years.  I see no evidence of recurrent disease.  Will plan to get her back in 1 year.  I do not see that we need any lab work in between visits.    ______________________________ Josph Macho, M.D. PRE/MEDQ  D:  05/26/2011  T:  05/27/2011  Job:  [id number]

## 2011-06-25 ENCOUNTER — Other Ambulatory Visit: Payer: Self-pay | Admitting: *Deleted

## 2011-06-25 ENCOUNTER — Telehealth: Payer: Self-pay | Admitting: Internal Medicine

## 2011-06-25 DIAGNOSIS — R7309 Other abnormal glucose: Secondary | ICD-10-CM

## 2011-06-25 DIAGNOSIS — D649 Anemia, unspecified: Secondary | ICD-10-CM

## 2011-06-25 DIAGNOSIS — E785 Hyperlipidemia, unspecified: Secondary | ICD-10-CM

## 2011-06-25 DIAGNOSIS — Z79899 Other long term (current) drug therapy: Secondary | ICD-10-CM

## 2011-06-25 MED ORDER — CARVEDILOL 6.25 MG PO TABS: 6.2500 mg | ORAL_TABLET | Freq: Two times a day (BID) | ORAL | Status: DC

## 2011-06-25 MED ORDER — HYDROCHLOROTHIAZIDE 25 MG PO TABS: 25.0000 mg | ORAL_TABLET | Freq: Every day | ORAL | Status: DC

## 2011-06-25 NOTE — Telephone Encounter (Signed)
Call placed to patient she was informed office visit was not needed. She is due for follow up in September 2013. Rx refills sent to pharmacy.

## 2011-06-25 NOTE — Telephone Encounter (Signed)
Rx refill sent to pharmacy. 

## 2011-09-14 LAB — LIPID PANEL
Cholesterol: 295 mg/dL — ABNORMAL HIGH (ref 0–200)
HDL: 44 mg/dL (ref >39)
LDL Cholesterol: 213 mg/dL — ABNORMAL HIGH (ref 0–99)
Total CHOL/HDL Ratio: 6.7 Ratio
Triglycerides: 191 mg/dL — ABNORMAL HIGH (ref <150)
VLDL: 38 mg/dL (ref 0–40)

## 2011-09-14 LAB — BASIC METABOLIC PANEL
BUN: 10 mg/dL (ref 6–23)
Calcium: 9.8 mg/dL (ref 8.4–10.5)
Chloride: 103 mEq/L (ref 96–112)
Creat: 0.72 mg/dL (ref 0.50–1.10)

## 2011-09-14 LAB — HEMOGLOBIN A1C: Mean Plasma Glucose: 117 mg/dL — ABNORMAL HIGH (ref <117)

## 2011-09-14 NOTE — Addendum Note (Signed)
Addended by: Regis Bill on: 09/14/2011 08:36 AM   Modules accepted: Orders

## 2011-09-27 ENCOUNTER — Telehealth: Payer: Self-pay | Admitting: Internal Medicine

## 2011-09-27 ENCOUNTER — Ambulatory Visit (INDEPENDENT_AMBULATORY_CARE_PROVIDER_SITE_OTHER): Payer: Medicare Other | Admitting: Internal Medicine

## 2011-09-27 ENCOUNTER — Encounter: Payer: Self-pay | Admitting: Internal Medicine

## 2011-09-27 VITALS — BP 108/62 | HR 74 | Temp 98.1°F | Resp 16 | Wt 213.0 lb

## 2011-09-27 DIAGNOSIS — I10 Essential (primary) hypertension: Secondary | ICD-10-CM

## 2011-09-27 DIAGNOSIS — E785 Hyperlipidemia, unspecified: Secondary | ICD-10-CM

## 2011-09-27 DIAGNOSIS — R7309 Other abnormal glucose: Secondary | ICD-10-CM

## 2011-09-27 MED ORDER — HYDROCHLOROTHIAZIDE 25 MG PO TABS: 25.0000 mg | ORAL_TABLET | Freq: Every day | ORAL | Status: DC

## 2011-09-27 MED ORDER — CARVEDILOL 6.25 MG PO TABS: 6.2500 mg | ORAL_TABLET | Freq: Two times a day (BID) | ORAL | Status: DC

## 2011-09-27 NOTE — Patient Instructions (Signed)
Please schedule fasting labs prior to next visit Cbc, chem7, a1c-250.00 and lipid-272.4

## 2011-09-27 NOTE — Progress Notes (Signed)
  Subjective:    Patient ID: ANY MCNEICE, female    DOB: 08/02/1945, 66 y.o.   MRN: [id number]  HPI Pt presents to clinic for followup of multiple medical problems. Reviewed persistently and significantly elevated tchol and ldl. Has been intolerant of multiple statins including lipitor, crestor and zocor. Failed zetia and could not tolerate otc red yeast extract. BP reveiwed normotensive and glycemic control remains good.   Past Medical History  Diagnosis Date  . Nonischemic cardiomyopathy     improved by most recent echocardiogram  . LBBB (left bundle branch block)   . Cancer 2009  . Colon polyp     unclear pathology  . Hyperlipidemia   . Hypertension   . Arthritis     left knee pain, MRI in 2008-tricompartmental  degenerate changes, mucoid degeneration of ACL, post horn meniscal tear and ant horn meniscal tear  . Low back pain     left L3-4 injected   Past Surgical History  Procedure Date  . Appendectomy 2009    Dr. Ezzard Standing  . Carpal tunnel release 2007    Dr. Teressa Senter  . Abdominal hysterectomy   . Breast surgery     lumpectomy  . Tonsillectomy   . Knee arthroscopy     Right    reports that she quit smoking about 41 years ago. She has never used smokeless tobacco. She reports that she does not drink alcohol. Her drug history not on file. family history includes Cancer in her other; Coronary artery disease in her other; Diabetes in her brother and sister; Heart attack (age of onset:72) in her mother; Hyperlipidemia in her other; and Hypertension in her other. Allergies  Allergen Reactions  . Contrast Media (Iodinated Diagnostic Agents)     N&V  . Lisinopril     REACTION: cough  . Naproxen Sodium     REACTION: Breathing difficulty  . Penicillins     REACTION: Welps  . Simvastatin     REACTION: muscle aches      Review of Systems see hpi     Objective:   Physical Exam  Physical Exam  Nursing note and vitals reviewed. Constitutional: Appears well-developed  and well-nourished. No distress.  HENT:  Head: Normocephalic and atraumatic.  Right Ear: External ear normal.  Left Ear: External ear normal.  Eyes: Conjunctivae are normal. No scleral icterus.  Neck: Neck supple. Carotid bruit is not present.  Cardiovascular: Normal rate, regular rhythm and normal heart sounds.  Exam reveals no gallop and no friction rub.   No murmur heard. Pulmonary/Chest: Effort normal and breath sounds normal. No respiratory distress. He has no wheezes. no rales.  Lymphadenopathy:    He has no cervical adenopathy.  Neurological:Alert.  Skin: Skin is warm and dry. Not diaphoretic.  Psychiatric: Has a normal mood and affect.        Assessment & Plan:

## 2011-09-27 NOTE — Assessment & Plan Note (Signed)
Normotensive and stable. Continue current regimen. Monitor bp as outpt and followup in clinic as scheduled.  

## 2011-09-27 NOTE — Assessment & Plan Note (Signed)
Poor control. Declines further attempts at medication.

## 2011-09-27 NOTE — Assessment & Plan Note (Signed)
Good control

## 2012-01-24 ENCOUNTER — Ambulatory Visit: Payer: Medicare Other | Admitting: Family

## 2012-01-24 ENCOUNTER — Ambulatory Visit: Payer: Medicare Other | Admitting: Internal Medicine

## 2012-02-15 ENCOUNTER — Telehealth: Payer: Self-pay | Admitting: Internal Medicine

## 2012-02-15 MED ORDER — CANDESARTAN CILEXETIL 16 MG PO TABS: 16.0000 mg | ORAL_TABLET | Freq: Every day | ORAL | Status: DC

## 2012-02-15 NOTE — Telephone Encounter (Signed)
Refill- candesartan 16mg  tablets. Take one tablet by mouth everyday. Qty 30 last fill 12.30.13

## 2012-04-14 ENCOUNTER — Other Ambulatory Visit: Payer: Self-pay | Admitting: Hematology & Oncology

## 2012-04-14 DIAGNOSIS — Z1231 Encounter for screening mammogram for malignant neoplasm of breast: Secondary | ICD-10-CM

## 2012-04-18 ENCOUNTER — Ambulatory Visit (INDEPENDENT_AMBULATORY_CARE_PROVIDER_SITE_OTHER): Payer: Medicare Other | Admitting: Family Medicine

## 2012-04-18 ENCOUNTER — Encounter: Payer: Self-pay | Admitting: Family Medicine

## 2012-04-18 VITALS — BP 128/70 | HR 79 | Temp 98.0°F | Ht 65.5 in | Wt 202.1 lb

## 2012-04-18 DIAGNOSIS — R739 Hyperglycemia, unspecified: Secondary | ICD-10-CM

## 2012-04-18 DIAGNOSIS — R5381 Other malaise: Secondary | ICD-10-CM

## 2012-04-18 DIAGNOSIS — K5900 Constipation, unspecified: Secondary | ICD-10-CM

## 2012-04-18 DIAGNOSIS — I10 Essential (primary) hypertension: Secondary | ICD-10-CM

## 2012-04-18 DIAGNOSIS — IMO0001 Reserved for inherently not codable concepts without codable children: Secondary | ICD-10-CM

## 2012-04-18 DIAGNOSIS — E785 Hyperlipidemia, unspecified: Secondary | ICD-10-CM

## 2012-04-18 DIAGNOSIS — R51 Headache: Secondary | ICD-10-CM

## 2012-04-18 DIAGNOSIS — K219 Gastro-esophageal reflux disease without esophagitis: Secondary | ICD-10-CM

## 2012-04-18 DIAGNOSIS — R7309 Other abnormal glucose: Secondary | ICD-10-CM

## 2012-04-18 DIAGNOSIS — M791 Myalgia, unspecified site: Secondary | ICD-10-CM

## 2012-04-18 LAB — RENAL FUNCTION PANEL
CO2: 26 mEq/L (ref 19–32)
Chloride: 101 mEq/L (ref 96–112)
Phosphorus: 3.7 mg/dL (ref 2.3–4.6)
Potassium: 3.6 mEq/L (ref 3.5–5.3)

## 2012-04-18 LAB — HIV ANTIBODY (ROUTINE TESTING W REFLEX): HIV: NONREACTIVE

## 2012-04-18 LAB — HEPATIC FUNCTION PANEL
ALT: 13 U/L (ref 0–35)
Albumin: 4.3 g/dL (ref 3.5–5.2)
Indirect Bilirubin: 0.3 mg/dL (ref 0.0–0.9)
Total Protein: 7.6 g/dL (ref 6.0–8.3)

## 2012-04-18 LAB — LIPID PANEL
Cholesterol: 261 mg/dL — ABNORMAL HIGH (ref 0–200)
LDL Cholesterol: 190 mg/dL — ABNORMAL HIGH (ref 0–99)
Triglycerides: 123 mg/dL (ref <150)
VLDL: 25 mg/dL (ref 0–40)

## 2012-04-18 LAB — CBC
HCT: 36.2 % (ref 36.0–46.0)
Platelets: 306 10*3/uL (ref 150–400)
RBC: 4.13 MIL/uL (ref 3.87–5.11)
RDW: 15.3 % (ref 11.5–15.5)
WBC: 5.8 10*3/uL (ref 4.0–10.5)

## 2012-04-18 NOTE — Patient Instructions (Addendum)
Consider a daily probiotic such as Digestive Advantage by [employer] Start Krill oil caps daily such as MegaRed No partially hydrogenated oils/trans fats  Next appt GYN  Constipation, Adult Constipation is when a person has fewer than 3 bowel movements a week; has difficulty having a bowel movement; or has stools that are dry, hard, or larger than normal. As people grow older, constipation is more common. If you try to fix constipation with medicines that make you have a bowel movement (laxatives), the problem may get worse. Long-term laxative use may cause the muscles of the colon to become weak. A low-fiber diet, not taking in enough fluids, and taking certain medicines may make constipation worse. CAUSES   Certain medicines, such as antidepressants, pain medicine, iron supplements, antacids, and water pills.   Certain diseases, such as diabetes, irritable bowel syndrome (IBS), thyroid disease, or depression.   Not drinking enough water.   Not eating enough fiber-rich foods.   Stress or travel.  Lack of physical activity or exercise.  Not going to the restroom when there is the urge to have a bowel movement.  Ignoring the urge to have a bowel movement.  Using laxatives too much. SYMPTOMS   Having fewer than 3 bowel movements a week.   Straining to have a bowel movement.   Having hard, dry, or larger than normal stools.   Feeling full or bloated.   Pain in the lower abdomen.  Not feeling relief after having a bowel movement. DIAGNOSIS  Your caregiver will take a medical history and perform a physical exam. Further testing may be done for severe constipation. Some tests may include:   A barium enema X-ray to examine your rectum, colon, and sometimes, your small intestine.  A sigmoidoscopy to examine your lower colon.  A colonoscopy to examine your entire colon. TREATMENT  Treatment will depend on the severity of your constipation and what is causing it. Some  dietary treatments include drinking more fluids and eating more fiber-rich foods. Lifestyle treatments may include regular exercise. If these diet and lifestyle recommendations do not help, your caregiver may recommend taking over-the-counter laxative medicines to help you have bowel movements. Prescription medicines may be prescribed if over-the-counter medicines do not work.  HOME CARE INSTRUCTIONS   Increase dietary fiber in your diet, such as fruits, vegetables, whole grains, and beans. Limit high-fat and processed sugars in your diet, such as [place] fries, hamburgers, cookies, candies, and soda.   A fiber supplement may be added to your diet if you cannot get enough fiber from foods.   Drink enough fluids to keep your urine clear or pale yellow.   Exercise regularly or as directed by your caregiver.   Go to the restroom when you have the urge to go. Do not hold it.  Only take medicines as directed by your caregiver. Do not take other medicines for constipation without talking to your caregiver first. SEEK IMMEDIATE MEDICAL CARE IF:   You have bright red blood in your stool.   Your constipation lasts for more than 4 days or gets worse.   You have abdominal or rectal pain.   You have thin, pencil-like stools.  You have unexplained weight loss. MAKE SURE YOU:   Understand these instructions.  Will watch your condition.  Will get help right away if you are not doing well or get worse. Document Released: 10/03/2003 Document Revised: 03/29/2011 Document Reviewed: 12/08/2010 [facility] Patient Information 2013 [place], [place].

## 2012-04-19 LAB — HEMOGLOBIN A1C: Mean Plasma Glucose: 117 mg/dL — ABNORMAL HIGH (ref <117)

## 2012-04-19 NOTE — Progress Notes (Signed)
Quick Note:  Patient Informed and voiced understanding ______ 

## 2012-04-20 NOTE — Progress Notes (Signed)
Pt informed

## 2012-04-22 ENCOUNTER — Encounter: Payer: Self-pay | Admitting: Family Medicine

## 2012-04-22 DIAGNOSIS — K5900 Constipation, unspecified: Secondary | ICD-10-CM

## 2012-04-22 HISTORY — DX: Constipation, unspecified: K59.00

## 2012-04-22 NOTE — Assessment & Plan Note (Signed)
Encouraged good hydration, add a probiotic and fiber supplement. Consider a stool softener if symptoms persist

## 2012-04-22 NOTE — Assessment & Plan Note (Signed)
Encouraged DASH diet and avoid simple carbs

## 2012-04-22 NOTE — Assessment & Plan Note (Signed)
Well controlled at today's visit. No changes

## 2012-04-22 NOTE — Progress Notes (Signed)
Patient ID: Brenda Marsh, female   DOB: 1945/03/27, 67 y.o.   MRN: [id number] KAIA DEPAOLIS [id number] 01-31-1945 04/22/2012      Progress Note-Follow Up  Subjective  Chief Complaint  Chief Complaint  Patient presents with  . Follow-up    HPI  Patient is a 67 year old Caucasian female who is in today for followup. Complaining of some low-grade constipation. Has to strain to move her bowels every one to 2 days. This takes over-the-counter. At times. No bloody or tarry stool. Does complain of some mild fatigue malaise myalgias and some intermittent headaches. Has had some mild nasal congestion but no significant rhinorrhea. Denies nausea vomiting or diarrhea but does note a decrease in her appetite over the last few months. No ear pain or throat pain. No significant sputum production. Has not tolerated statins in the past.  Past Medical History  Diagnosis Date  . Nonischemic cardiomyopathy     improved by most recent echocardiogram  . LBBB (left bundle branch block)   . Cancer 2007/06/11  . Colon polyp     unclear pathology  . Hyperlipidemia   . Hypertension   . Arthritis     left knee pain, MRI in 2008-tricompartmental  degenerate changes, mucoid degeneration of ACL, post horn meniscal tear and ant horn meniscal tear  . Low back pain     left L3-4 injected  . Unspecified constipation 04/22/2012    Past Surgical History  Procedure Laterality Date  . Appendectomy  2007-06-11    Dr. Ezzard Standing  . Carpal tunnel release  Jun 10, 2005    Dr. Teressa Senter  . Abdominal hysterectomy    . Breast surgery      lumpectomy  . Tonsillectomy    . Knee arthroscopy      Right    Family History  Problem Relation Age of Onset  . Heart attack Mother 74  . Diabetes Sister   . Diabetes Brother   . Coronary artery disease Other     Female first degree relative <60  . Hyperlipidemia Other   . Hypertension Other   . Cancer Other     prostate, 1st degree relative <50    History   Social History  .  Marital Status: Widowed    Spouse Name: N/A    Number of Children: 2  . Years of Education: N/A   Occupational History  .     Social History Main Topics  . Smoking status: Former Smoker    Quit date: 01/18/1970  . Smokeless tobacco: Never Used  . Alcohol Use: No  . Drug Use: Not on file  . Sexually Active: Not on file   Other Topics Concern  . Not on file   Social History Narrative   Widow - husband died in 06/11/2003   Occupation - retired from working in Museum/gallery curator   2 daughter - on lives in [place], other in [place]   Remote history of tobacco but none in 40 years   Alcohol use - no    Current Outpatient Prescriptions on File Prior to Visit  Medication Sig Dispense Refill  . Alpha-Lipoic Acid 200 MG CAPS Take 1 capsule by mouth daily.        Marland Kitchen b complex vitamins tablet Take 1 tablet by mouth daily.        . candesartan (ATACAND) 16 MG tablet Take 1 tablet (16 mg total) by mouth daily.  30 tablet  2  . carvedilol (COREG) 6.25 MG tablet Take 1 tablet (  6.25 mg total) by mouth 2 (two) times daily with a meal.  180 tablet  2  . Cholecalciferol (VITAMIN D3) 2000 UNITS TABS Take 1 tablet by mouth daily.        . hydrochlorothiazide (HYDRODIURIL) 25 MG tablet Take 1 tablet (25 mg total) by mouth daily.  90 tablet  2  . Multiple Vitamin (MULTIVITAMIN) tablet Take 1 tablet by mouth daily.        Bertram Gala Glycol-Propyl Glycol (SYSTANE OP) Apply to eye 2 (two) times daily.        . vitamin C (ASCORBIC ACID) 500 MG tablet Take 500 mg by mouth daily.         No current facility-administered medications on file prior to visit.    Allergies  Allergen Reactions  . Contrast Media (Iodinated Diagnostic Agents)     N&V  . Lisinopril     REACTION: cough  . Naproxen Sodium     REACTION: Breathing difficulty  . Penicillins     REACTION: Welps  . Simvastatin     REACTION: muscle aches    Review of Systems  Review of Systems  Constitutional: Negative for fever and malaise/fatigue.   HENT: Positive for congestion.   Eyes: Negative for discharge.  Respiratory: Negative for shortness of breath.   Cardiovascular: Negative for chest pain, palpitations and leg swelling.  Gastrointestinal: Positive for constipation. Negative for nausea, abdominal pain and diarrhea.  Genitourinary: Negative for dysuria.  Musculoskeletal: Negative for falls.  Skin: Negative for rash.  Neurological: Positive for headaches. Negative for loss of consciousness.  Endo/Heme/Allergies: Negative for polydipsia.  Psychiatric/Behavioral: Negative for depression and suicidal ideas. The patient is not nervous/anxious and does not have insomnia.     Objective  BP 128/70  Pulse 79  Temp(Src) 98 F (36.7 C) (Oral)  Ht 5' 5.5" (1.664 m)  Wt 202 lb 1.9 oz (91.681 kg)  BMI 33.11 kg/m2  SpO2 98%  Physical Exam  Physical Exam  Constitutional: She is oriented to person, place, and time and well-developed, well-nourished, and in no distress. No distress.  HENT:  Head: Normocephalic and atraumatic.  Eyes: Conjunctivae are normal.  Neck: Neck supple. No thyromegaly present.  Cardiovascular: Normal rate, regular rhythm and normal heart sounds.   No murmur heard. Pulmonary/Chest: Effort normal and breath sounds normal. She has no wheezes.  Abdominal: She exhibits no distension and no mass.  Musculoskeletal: She exhibits no edema.  Lymphadenopathy:    She has no cervical adenopathy.  Neurological: She is alert and oriented to person, place, and time.  Skin: Skin is warm and dry. No rash noted. She is not diaphoretic.  Psychiatric: Memory, affect and judgment normal.    Lab Results  Component Value Date   TSH 1.009 04/18/2012   Lab Results  Component Value Date   WBC 5.8 04/18/2012   HGB 12.0 04/18/2012   HCT 36.2 04/18/2012   MCV 87.7 04/18/2012   PLT 306 04/18/2012   Lab Results  Component Value Date   CREATININE 0.65 04/18/2012   BUN 13 04/18/2012   NA 139 04/18/2012   K 3.6 04/18/2012   CL 101  04/18/2012   CO2 26 04/18/2012   Lab Results  Component Value Date   ALT 13 04/18/2012   AST 18 04/18/2012   ALKPHOS 66 04/18/2012   BILITOT 0.4 04/18/2012   Lab Results  Component Value Date   CHOL 261* 04/18/2012   Lab Results  Component Value Date   HDL 46 04/18/2012  Lab Results  Component Value Date   LDLCALC 190* 04/18/2012   Lab Results  Component Value Date   TRIG 123 04/18/2012   Lab Results  Component Value Date   CHOLHDL 5.7 04/18/2012     Assessment & Plan  HYPERTENSION Well controlled at today's visit. No changes  GERD Avoid offending foods, may acid suppressing meds prn  Unspecified constipation Encouraged good hydration, add a probiotic and fiber supplement. Consider a stool softener if symptoms persist  HYPERLIPIDEMIA Cholesterol improved but still too hi, needs to minimize simple carbs and saturated fats, avoid trans fats. Add a krill oil cap daily. Patient resistent to statins  DIABETES MELLITUS, TYPE II, BORDERLINE Encouraged DASH diet and avoid simple carbs

## 2012-04-22 NOTE — Assessment & Plan Note (Signed)
Cholesterol improved but still too hi, needs to minimize simple carbs and saturated fats, avoid trans fats. Add a krill oil cap daily. Patient resistent to statins

## 2012-04-22 NOTE — Assessment & Plan Note (Signed)
Avoid offending foods, may acid suppressing meds prn

## 2012-05-15 ENCOUNTER — Other Ambulatory Visit: Payer: Self-pay | Admitting: Family Medicine

## 2012-05-16 NOTE — Telephone Encounter (Signed)
Please advise refill? 

## 2012-05-19 ENCOUNTER — Ambulatory Visit (HOSPITAL_BASED_OUTPATIENT_CLINIC_OR_DEPARTMENT_OTHER)
Admission: RE | Admit: 2012-05-19 | Discharge: 2012-05-19 | Disposition: A | Discharge status: Home / Self Care | Payer: Medicare Other | Source: Ambulatory Visit | Attending: Hematology & Oncology | Admitting: Hematology & Oncology

## 2012-05-19 DIAGNOSIS — Z1231 Encounter for screening mammogram for malignant neoplasm of breast: Secondary | ICD-10-CM | POA: Insufficient documentation

## 2012-05-29 ENCOUNTER — Other Ambulatory Visit (HOSPITAL_BASED_OUTPATIENT_CLINIC_OR_DEPARTMENT_OTHER): Payer: Medicare Other | Admitting: Lab

## 2012-05-29 ENCOUNTER — Ambulatory Visit (HOSPITAL_BASED_OUTPATIENT_CLINIC_OR_DEPARTMENT_OTHER): Payer: Medicare Other | Admitting: Hematology & Oncology

## 2012-05-29 VITALS — BP 105/56 | HR 68 | Temp 97.6°F | Resp 16 | Ht 65.0 in | Wt 203.0 lb

## 2012-05-29 DIAGNOSIS — Z853 Personal history of malignant neoplasm of breast: Secondary | ICD-10-CM

## 2012-05-29 LAB — CBC WITH DIFFERENTIAL (CANCER CENTER ONLY)
BASO#: 0 10*3/uL (ref 0.0–0.2)
BASO%: 0.7 % (ref 0.0–2.0)
Eosinophils Absolute: 0.3 10*3/uL (ref 0.0–0.5)
HCT: 35.8 % (ref 34.8–46.6)
HGB: 11.7 g/dL (ref 11.6–15.9)
LYMPH#: 3.1 10*3/uL (ref 0.9–3.3)
MCV: 91 fL (ref 81–101)
MONO#: 0.6 10*3/uL (ref 0.1–0.9)
NEUT%: 33.3 % — ABNORMAL LOW (ref 39.6–80.0)
RBC: 3.95 10*6/uL (ref 3.70–5.32)
WBC: 6.1 10*3/uL (ref 3.9–10.0)

## 2012-05-29 LAB — COMPREHENSIVE METABOLIC PANEL
BUN: 10 mg/dL (ref 6–23)
CO2: 27 mEq/L (ref 19–32)
Calcium: 10 mg/dL (ref 8.4–10.5)
Chloride: 100 mEq/L (ref 96–112)
Creatinine, Ser: 0.74 mg/dL (ref 0.50–1.10)

## 2012-05-29 NOTE — Progress Notes (Signed)
This office note has been dictated.

## 2012-05-30 NOTE — Progress Notes (Signed)
CC:   Brenda Arbour, MD  DIAGNOSIS:  Stage IIA (T1 N1 M0) ductal carcinoma of the left breast.  CURRENT THERAPY:  Observation.  INTERIM HISTORY:  Brenda Marsh comes in for followup.  She is doing okay. She __________ every year.  She has had no problems since we last saw her.  There have been no problems pain-wise.  She does have the lymphedema in her left arm which has been chronic.  Her last mammogram was done just last week.  The mammogram did not show any evidence of suspicious microcalcifications.  She is trying to lose a little bit of weight.  She is trying to exercise a little bit more.  There has been no change in her medications.  When we last saw her, her TSH was normal. We also are checking a CA27.29.  That also was a little bit on the high side, but again has been stable.  PHYSICAL EXAMINATION:  General:  This is a well-developed, well- nourished African American female in no obvious distress.  Vital signs: Temperature of 97.6, pulse 68, respiratory rate 16, blood pressure 105/56.  Weight is 203.  Head and neck:  Normocephalic, atraumatic skull.  There are no ocular or oral lesions.  There are no palpable cervical or supraclavicular lymph nodes.  Lungs:  Clear bilaterally. Cardiac:  Regular rate and rhythm, with a normal S1 and S2.  There are no murmurs, rubs or bruits.  Abdomen:  Soft with good bowel sounds. There is no palpable abdominal mass.  There is no fluid wave.  There is no palpable hepatosplenomegaly.  Breasts:  Right breast shows no masses, edema, or erythema.  There is no right axillary adenopathy.  The left breast is somewhat contracted from radiation and surgery.  She has the well-healed lumpectomy at the 2 o'clock position.  There is some firmness at the lumpectomy site.  No distinct mass is noted at the lumpectomy site.  There is no left axillary adenopathy.  Back:  No tenderness over the spine, ribs, or hips.  Extremities:  Show no clubbing, cyanosis  or edema.  Neurological:  Shows no focal neurological deficits.  Skin:  No rashes, ecchymoses, or petechia.  LABORATORY STUDIES:  White cell count 6.1, hemoglobin 11.7, hematocrit 35.8, platelet count is 263,000.  IMPRESSION:  Brenda Marsh is a very nice 67 year old African American female with stage IIA ductal carcinoma of the left breast.  She has done well with this.  So far, I have not seen any evidence of recurrent disease.  She is out now over 10 years.  We will continue to follow her up yearly.  I do not see an indication for any treatment or lab work in between visits.  She did have a bone density test done last year.  She was found to have no osteopenia or osteoporosis.    ______________________________ Josph Macho, M.D. PRE/MEDQ  D:  05/29/2012  T:  05/30/2012  Job:  [id number]

## 2012-06-26 ENCOUNTER — Telehealth: Payer: Self-pay | Admitting: Family Medicine

## 2012-06-26 ENCOUNTER — Observation Stay (HOSPITAL_BASED_OUTPATIENT_CLINIC_OR_DEPARTMENT_OTHER)
Admission: EM | Admit: 2012-06-26 | Discharge: 2012-06-28 | Disposition: A | Discharge status: Home / Self Care | Payer: Medicare Other | Attending: Internal Medicine | Admitting: Internal Medicine

## 2012-06-26 ENCOUNTER — Encounter (HOSPITAL_BASED_OUTPATIENT_CLINIC_OR_DEPARTMENT_OTHER): Payer: Self-pay

## 2012-06-26 ENCOUNTER — Emergency Department (HOSPITAL_COMMUNITY): Payer: Medicare Other

## 2012-06-26 DIAGNOSIS — Z853 Personal history of malignant neoplasm of breast: Secondary | ICD-10-CM | POA: Insufficient documentation

## 2012-06-26 DIAGNOSIS — M549 Dorsalgia, unspecified: Secondary | ICD-10-CM

## 2012-06-26 DIAGNOSIS — K219 Gastro-esophageal reflux disease without esophagitis: Secondary | ICD-10-CM | POA: Insufficient documentation

## 2012-06-26 DIAGNOSIS — M1710 Unilateral primary osteoarthritis, unspecified knee: Secondary | ICD-10-CM

## 2012-06-26 DIAGNOSIS — E782 Mixed hyperlipidemia: Secondary | ICD-10-CM | POA: Diagnosis present

## 2012-06-26 DIAGNOSIS — Z79899 Other long term (current) drug therapy: Secondary | ICD-10-CM | POA: Insufficient documentation

## 2012-06-26 DIAGNOSIS — I499 Cardiac arrhythmia, unspecified: Secondary | ICD-10-CM

## 2012-06-26 DIAGNOSIS — I10 Essential (primary) hypertension: Secondary | ICD-10-CM | POA: Insufficient documentation

## 2012-06-26 DIAGNOSIS — H8110 Benign paroxysmal vertigo, unspecified ear: Secondary | ICD-10-CM

## 2012-06-26 DIAGNOSIS — R7309 Other abnormal glucose: Secondary | ICD-10-CM

## 2012-06-26 DIAGNOSIS — N390 Urinary tract infection, site not specified: Secondary | ICD-10-CM

## 2012-06-26 DIAGNOSIS — M542 Cervicalgia: Secondary | ICD-10-CM

## 2012-06-26 DIAGNOSIS — R42 Dizziness and giddiness: Secondary | ICD-10-CM

## 2012-06-26 DIAGNOSIS — E785 Hyperlipidemia, unspecified: Secondary | ICD-10-CM

## 2012-06-26 DIAGNOSIS — I428 Other cardiomyopathies: Secondary | ICD-10-CM

## 2012-06-26 DIAGNOSIS — Z86010 Personal history of colonic polyps: Secondary | ICD-10-CM

## 2012-06-26 DIAGNOSIS — R112 Nausea with vomiting, unspecified: Secondary | ICD-10-CM

## 2012-06-26 DIAGNOSIS — M1990 Unspecified osteoarthritis, unspecified site: Secondary | ICD-10-CM

## 2012-06-26 DIAGNOSIS — K5900 Constipation, unspecified: Secondary | ICD-10-CM

## 2012-06-26 DIAGNOSIS — IMO0002 Reserved for concepts with insufficient information to code with codable children: Secondary | ICD-10-CM

## 2012-06-26 DIAGNOSIS — E876 Hypokalemia: Secondary | ICD-10-CM | POA: Insufficient documentation

## 2012-06-26 DIAGNOSIS — R2681 Unsteadiness on feet: Secondary | ICD-10-CM

## 2012-06-26 LAB — URINALYSIS, ROUTINE W REFLEX MICROSCOPIC
Glucose, UA: NEGATIVE mg/dL
Ketones, ur: NEGATIVE mg/dL
Protein, ur: NEGATIVE mg/dL

## 2012-06-26 LAB — URINE MICROSCOPIC-ADD ON

## 2012-06-26 LAB — CBC
HCT: 32.8 % — ABNORMAL LOW (ref 36.0–46.0)
RBC: 3.71 MIL/uL — ABNORMAL LOW (ref 3.87–5.11)
RDW: 14.4 % (ref 11.5–15.5)
WBC: 7.2 10*3/uL (ref 4.0–10.5)

## 2012-06-26 LAB — MAGNESIUM: Magnesium: 1.9 mg/dL (ref 1.5–2.5)

## 2012-06-26 LAB — COMPREHENSIVE METABOLIC PANEL
AST: 18 U/L (ref 0–37)
Albumin: 3.2 g/dL — ABNORMAL LOW (ref 3.5–5.2)
Alkaline Phosphatase: 74 U/L (ref 39–117)
BUN: 12 mg/dL (ref 6–23)
CO2: 26 mEq/L (ref 19–32)
Chloride: 104 mEq/L (ref 96–112)
Potassium: 2.8 mEq/L — ABNORMAL LOW (ref 3.5–5.1)
Total Bilirubin: 0.2 mg/dL — ABNORMAL LOW (ref 0.3–1.2)

## 2012-06-26 MED ORDER — MECLIZINE HCL 25 MG PO TABS
25.0000 mg | ORAL_TABLET | Freq: Three times a day (TID) | ORAL | Status: DC | PRN
  Administered 2012-06-26 – 2012-06-27 (×2): 25 mg via ORAL
  Filled 2012-06-26 (×2): qty 1

## 2012-06-26 MED ORDER — ONDANSETRON HCL 4 MG/2ML IJ SOLN
INTRAMUSCULAR | Status: AC
  Administered 2012-06-26: 4 mg
  Filled 2012-06-26: qty 2

## 2012-06-26 MED ORDER — CARVEDILOL 6.25 MG PO TABS: 6.2500 mg | ORAL_TABLET | Freq: Two times a day (BID) | ORAL | Status: DC

## 2012-06-26 MED ORDER — ACETAMINOPHEN 325 MG PO TABS: 650.0000 mg | ORAL_TABLET | Freq: Four times a day (QID) | ORAL | Status: DC | PRN

## 2012-06-26 MED ORDER — DIAZEPAM 5 MG/ML IJ SOLN
2.5000 mg | Freq: Once | INTRAMUSCULAR | Status: AC
  Administered 2012-06-26: 2.5 mg via INTRAVENOUS
  Filled 2012-06-26: qty 2

## 2012-06-26 MED ORDER — HYDROCHLOROTHIAZIDE 25 MG PO TABS: 25.0000 mg | ORAL_TABLET | Freq: Every day | ORAL | Status: DC

## 2012-06-26 MED ORDER — SODIUM CHLORIDE 0.9 % IV BOLUS (SEPSIS)
500.0000 mL | Freq: Once | INTRAVENOUS | Status: AC
  Administered 2012-06-26: 500 mL via INTRAVENOUS

## 2012-06-26 MED ORDER — DIAZEPAM 5 MG/ML IJ SOLN
5.0000 mg | Freq: Once | INTRAMUSCULAR | Status: AC
  Administered 2012-06-26: 5 mg via INTRAVENOUS
  Filled 2012-06-26: qty 2

## 2012-06-26 MED ORDER — PROCHLORPERAZINE EDISYLATE 5 MG/ML IJ SOLN
10.0000 mg | Freq: Four times a day (QID) | INTRAMUSCULAR | Status: DC | PRN
  Administered 2012-06-27: 10 mg via INTRAVENOUS
  Filled 2012-06-26: qty 2

## 2012-06-26 MED ORDER — POTASSIUM CHLORIDE 10 MEQ/100ML IV SOLN
10.0000 meq | Freq: Once | INTRAVENOUS | Status: AC
  Administered 2012-06-26: 10 meq via INTRAVENOUS
  Filled 2012-06-26: qty 100

## 2012-06-26 MED ORDER — POTASSIUM CHLORIDE CRYS ER 20 MEQ PO TBCR: 20.0000 meq | EXTENDED_RELEASE_TABLET | Freq: Two times a day (BID) | ORAL | Status: DC

## 2012-06-26 MED ORDER — POTASSIUM CHLORIDE CRYS ER 20 MEQ PO TBCR
40.0000 meq | EXTENDED_RELEASE_TABLET | Freq: Once | ORAL | Status: AC
  Administered 2012-06-26: 40 meq via ORAL
  Filled 2012-06-26: qty 2

## 2012-06-26 MED ORDER — SODIUM CHLORIDE 0.9 % IJ SOLN: 3.0000 mL | INTRAMUSCULAR | Status: DC | PRN

## 2012-06-26 MED ORDER — ENOXAPARIN SODIUM 40 MG/0.4ML ~~LOC~~ SOLN
40.0000 mg | Freq: Every day | SUBCUTANEOUS | Status: DC
  Administered 2012-06-26 – 2012-06-27 (×2): 40 mg via SUBCUTANEOUS
  Filled 2012-06-26 (×3): qty 0.4

## 2012-06-26 MED ORDER — PROCHLORPERAZINE MALEATE 10 MG PO TABS: 10.0000 mg | ORAL_TABLET | Freq: Three times a day (TID) | ORAL | Status: DC | PRN

## 2012-06-26 MED ORDER — PREDNISONE 20 MG PO TABS
40.0000 mg | ORAL_TABLET | Freq: Every day | ORAL | Status: DC
  Administered 2012-06-27 – 2012-06-28 (×2): 40 mg via ORAL
  Filled 2012-06-26 (×3): qty 2

## 2012-06-26 MED ORDER — LIDOCAINE HCL 2 % IJ SOLN
INTRAMUSCULAR | Status: AC
  Filled 2012-06-26: qty 20

## 2012-06-26 MED ORDER — ONDANSETRON HCL 4 MG/2ML IJ SOLN
4.0000 mg | Freq: Once | INTRAMUSCULAR | Status: AC
  Administered 2012-06-26: 4 mg via INTRAVENOUS
  Filled 2012-06-26: qty 2

## 2012-06-26 MED ORDER — CARVEDILOL 6.25 MG PO TABS
6.2500 mg | ORAL_TABLET | Freq: Two times a day (BID) | ORAL | Status: DC
  Administered 2012-06-26 – 2012-06-28 (×4): 6.25 mg via ORAL
  Filled 2012-06-26 (×6): qty 1

## 2012-06-26 MED ORDER — MECLIZINE HCL 12.5 MG PO TABS: 12.5000 mg | ORAL_TABLET | Freq: Once | ORAL | Status: DC

## 2012-06-26 MED ORDER — SODIUM CHLORIDE 0.9 % IV SOLN
INTRAVENOUS | Status: DC
  Administered 2012-06-26: 1000 mL via INTRAVENOUS

## 2012-06-26 MED ORDER — PROCHLORPERAZINE EDISYLATE 5 MG/ML IJ SOLN
10.0000 mg | Freq: Once | INTRAMUSCULAR | Status: AC
  Administered 2012-06-26: 10 mg via INTRAVENOUS
  Filled 2012-06-26: qty 2

## 2012-06-26 MED ORDER — SODIUM CHLORIDE 0.9 % IJ SOLN
3.0000 mL | Freq: Two times a day (BID) | INTRAMUSCULAR | Status: DC
  Administered 2012-06-26 – 2012-06-27 (×2): 3 mL via INTRAVENOUS

## 2012-06-26 MED ORDER — SULFAMETHOXAZOLE-TRIMETHOPRIM 800-160 MG PO TABS: 1.0000 | ORAL_TABLET | Freq: Two times a day (BID) | ORAL | Status: DC

## 2012-06-26 MED ORDER — MECLIZINE HCL 25 MG PO TABS
12.5000 mg | ORAL_TABLET | Freq: Once | ORAL | Status: AC
  Administered 2012-06-26: 12.5 mg via ORAL
  Filled 2012-06-26: qty 1

## 2012-06-26 MED ORDER — ACETAMINOPHEN 650 MG RE SUPP: 650.0000 mg | Freq: Four times a day (QID) | RECTAL | Status: DC | PRN

## 2012-06-26 MED ORDER — DEXTROSE 5 % IV SOLN
1.0000 g | INTRAVENOUS | Status: DC
  Administered 2012-06-26 – 2012-06-28 (×3): 1 g via INTRAVENOUS
  Filled 2012-06-26 (×3): qty 10

## 2012-06-26 MED ORDER — SODIUM CHLORIDE 0.9 % IV SOLN: 250.0000 mL | INTRAVENOUS | Status: DC | PRN

## 2012-06-26 MED ORDER — SODIUM CHLORIDE 0.9 % IJ SOLN
3.0000 mL | Freq: Two times a day (BID) | INTRAMUSCULAR | Status: DC
  Administered 2012-06-27: 3 mL via INTRAVENOUS

## 2012-06-26 NOTE — ED Notes (Signed)
Pt received from [facility] for MRI due to continual dizziness, nausea, vomiting. Pt reports headache. Lights turned out and given blanket and wash cloth. Reports nausea better when lying still. No further needs. Dr. Rubin Payor made aware pt is here.

## 2012-06-26 NOTE — ED Notes (Signed)
Pt cont sleeping with resp deep and regular. Report to [facility] nurse, report phoned to [facility] ED charge nurse. Pt daughter updated on plan of care.

## 2012-06-26 NOTE — ED Provider Notes (Addendum)
History     CSN: [id number]  Arrival date & time 06/26/12  0227   None     Chief Complaint  Patient presents with  . Dizziness    (Consider location/radiation/quality/duration/timing/severity/associated sxs/prior treatment) The history is provided by the patient.  pt notes acute onset dizziness tonight while sleeping approx 1 hr pta (pt last felt normal last pm at approx 9am prior to going to bed). Describes a room spinning sensation, made worse by movement, positional changes, sitting upright or turning head. +nv. No headache. No change in speech or vision. Denies numbness/weakness. Denies problem w fine motor movement, gait or coordination. No hearing loss or tinnitus. No uri c/o. States had 'inner ear'/vertigo one time 10+ yrs ago, states kind of similar but that was milder, briefer, and pt remained functional.  Pt denies any change in meds/new meds. Denies fever/chills.      Past Medical History  Diagnosis Date  . Nonischemic cardiomyopathy     improved by most recent echocardiogram  . LBBB (left bundle branch block)   . Cancer 2009  . Colon polyp     unclear pathology  . Hyperlipidemia   . Hypertension   . Arthritis     left knee pain, MRI in 2008-tricompartmental  degenerate changes, mucoid degeneration of ACL, post horn meniscal tear and ant horn meniscal tear  . Low back pain     left L3-4 injected  . Unspecified constipation 04/22/2012    Past Surgical History  Procedure Laterality Date  . Appendectomy  2009    Dr. Ezzard Standing  . Carpal tunnel release  2007    Dr. Teressa Senter  . Abdominal hysterectomy    . Breast surgery      lumpectomy  . Tonsillectomy    . Knee arthroscopy      Right    Family History  Problem Relation Age of Onset  . Heart attack Mother 12  . Diabetes Sister   . Diabetes Brother   . Coronary artery disease Other     Female first degree relative <60  . Hyperlipidemia Other   . Hypertension Other   . Cancer Other     prostate, 1st degree  relative <50    History  Substance Use Topics  . Smoking status: Former Smoker    Quit date: 01/18/1970  . Smokeless tobacco: Never Used  . Alcohol Use: No    OB History   Grav Para Term Preterm Abortions TAB SAB Ect Mult Living                  Review of Systems  Constitutional: Negative for fever and chills.  HENT: Negative for neck pain and neck stiffness.   Eyes: Negative for pain and visual disturbance.  Respiratory: Negative for cough and shortness of breath.   Cardiovascular: Negative for chest pain and palpitations.  Gastrointestinal: Negative for vomiting and abdominal pain.  Genitourinary: Negative for dysuria and flank pain.  Musculoskeletal: Negative for back pain.  Skin: Negative for rash.  Neurological: Negative for weakness, numbness and headaches.  Hematological: Does not bruise/bleed easily.  Psychiatric/Behavioral: Negative for confusion.    Allergies  Contrast media; Lisinopril; Naproxen sodium; Penicillins; and Simvastatin  Home Medications   Current Outpatient Rx  Name  Route  Sig  Dispense  Refill  . Alpha-Lipoic Acid 200 MG CAPS   Oral   Take 1 capsule by mouth daily.           . candesartan (ATACAND) 16 MG tablet  TAKE 1 TABLET BY MOUTH DAILY   30 tablet   2   . carvedilol (COREG) 6.25 MG tablet   Oral   Take 1 tablet (6.25 mg total) by mouth 2 (two) times daily with a meal.   180 tablet   2   . hydrochlorothiazide (HYDRODIURIL) 25 MG tablet   Oral   Take 1 tablet (25 mg total) by mouth daily.   90 tablet   2   . Multiple Vitamin (MULTIVITAMIN) tablet   Oral   Take 1 tablet by mouth daily.           Marland Kitchen OVER THE COUNTER MEDICATION      "not taking Vitamin  B-C-D lately, may start back"           BP 129/61  Pulse 65  Temp(Src) 98 F (36.7 C)  Resp 18  SpO2 99%  Physical Exam  Nursing note and vitals reviewed. Constitutional: She is oriented to person, place, and time. She appears well-developed and  well-nourished. No distress.  HENT:  Head: Atraumatic.  Nose: Nose normal.  Mouth/Throat: Oropharynx is clear and moist.  tms wnl  Eyes: Conjunctivae and EOM are normal. Pupils are equal, round, and reactive to light. No scleral icterus.  Neck: Normal range of motion. Neck supple. No tracheal deviation present.  No bruit  Cardiovascular: Normal rate, regular rhythm, normal heart sounds and intact distal pulses.   Pulmonary/Chest: Effort normal and breath sounds normal. No respiratory distress.  Abdominal: Soft. Normal appearance and bowel sounds are normal. She exhibits no distension. There is no tenderness.  Genitourinary:  No cva tenderness  Musculoskeletal: She exhibits no edema and no tenderness.  Neurological: She is alert and oriented to person, place, and time. No cranial nerve deficit.  No pronator drift. Finger to nose normal. Transient unidirectional nystagmus. Motor 5/5 bil. Pt unable to stand/walk, pt w severe dizziness.   Skin: Skin is warm and dry. No rash noted.  Psychiatric: She has a normal mood and affect.    ED Course  Procedures (including critical care time)  Results for orders placed during the hospital encounter of 06/26/12  CBC      Result Value Range   WBC 7.2  4.0 - 10.5 K/uL   RBC 3.71 (*) 3.87 - 5.11 MIL/uL   Hemoglobin 11.1 (*) 12.0 - 15.0 g/dL   HCT 16.1 (*) 09.6 - 04.5 %   MCV 88.4  78.0 - 100.0 fL   MCH 29.9  26.0 - 34.0 pg   MCHC 33.8  30.0 - 36.0 g/dL   RDW 40.9  81.1 - 91.4 %   Platelets 257  150 - 400 K/uL  COMPREHENSIVE METABOLIC PANEL      Result Value Range   Sodium 140  135 - 145 mEq/L   Potassium 2.8 (*) 3.5 - 5.1 mEq/L   Chloride 104  96 - 112 mEq/L   CO2 26  19 - 32 mEq/L   Glucose, Bld 115 (*) 70 - 99 mg/dL   BUN 12  6 - 23 mg/dL   Creatinine, Ser 7.82  0.50 - 1.10 mg/dL   Calcium 9.7  8.4 - 95.6 mg/dL   Total Protein 7.3  6.0 - 8.3 g/dL   Albumin 3.2 (*) 3.5 - 5.2 g/dL   AST 18  0 - 37 U/L   ALT 15  0 - 35 U/L   Alkaline  Phosphatase 74  39 - 117 U/L   Total Bilirubin 0.2 (*) 0.3 -  1.2 mg/dL   GFR calc non Af Amer 75 (*) >90 mL/min   GFR calc Af Amer 87 (*) >90 mL/min        MDM  Iv ns 500 cc bolus. Pt w nv, clear. Valium 5 mg iv, zofran iv.  Labs.  Reviewed nursing notes and prior charts for additional history.   k low, kcl 10 iv over 1 hr, kcl po.  Pt w recurrent/persistent nv, dizziness.   antivert po, additional iv. Zofran.   Pt unable to ambulate effectively, falls to side, dizziness worse.   Pt now states unlike prior remote hx 'inner ear'.  Given persistent dizziness without signif relief from meds, gait instability, will get mr r/u post circ cva.   Discussed pt with Dr Norlene Campbell, [facility] ED, who accepts in transfer for MRI - pt agreeable w plan (and in fact requested MRI after discussion w her).   If MRI neg for post circ cva, anticipate subsequent d/c from ed, w k repletion (in part due to hctz use without k supplementation), and symptomatic tx for vertigo, w possible referral to PT/ENT.   Additional valium 2.5 mg iv, and additonal 12.5 mg antivert for symptom relief.       Suzi Roots, MD 06/26/12 [id number]

## 2012-06-26 NOTE — Evaluation (Addendum)
Physical Therapy Vestibular Evaluation Patient Details Name: Brenda Marsh MRN: [id number] DOB: December 29, 1945 Today's Date: 06/26/2012 Time: 4098-1191 PT Time Calculation (min): 34 min  PT Assessment / Plan / Recommendation Clinical Impression  Patient is a 67 y/o female admitted with vertigo symptoms and negative MRI.  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  Issued HEP for vestibular adaptation.  Will follow up in AM for further testing and education prior to d/c.    PT Assessment  Patient needs continued PT services    Follow Up Recommendations  Home health PT;Supervision - Intermittent          Equipment Recommendations  Other (comment) (TBA)       Frequency Min 3X/week    Precautions / Restrictions Precautions Precautions: Fall   Pertinent Vitals/Pain Headache 5/10      Mobility  Bed Mobility Bed Mobility: Supine to Sit;Sit to Supine Supine to Sit: 5: Supervision;With rails Sit to Supine: 6: Modified independent (Device/Increase time) Details for Bed Mobility Assistance: supervision for safety due to sleepy with medications Transfers Transfers: Stand to Sit;Sit to Stand Sit to Stand: 4: Min guard;From bed Stand to Sit: 4: Min guard;To bed Details for Transfer Assistance: assist for safety due to off balance Ambulation/Gait Ambulation/Gait Assistance: 4: Min assist;4: Min guard Ambulation Distance (Feet): 105 Feet Assistive device: None Ambulation/Gait Assistance Details: tended to veer to right Gait Pattern: Step-through pattern;Shuffle;Decreased stride length    Vestibular Assessment Seated oculomotor exam: Intact smooth pursuits and saccades, no gaze holding nystagmus noted.  Performed Vestibular Ocular Reflex x 30 seconds seated vertical and horizontal head movements c/o  dizziness 5-6/10.  Issued for HEP for vestibular adaptation at home.   Other Exercises: Discussed safety at home due to fall risk.  Feel needs supervision close to 24 hours at least first 2-3 days at home.  States she would try to set up.  Denied need for walker due to multilevel home.   PT Diagnosis: Abnormality of gait;Other (comment) (vertigo)  PT Problem List: Decreased balance;Decreased mobility;Decreased knowledge of use of DME;Other (comment) (dizziness) PT Treatment Interventions: DME instruction;Gait training;Balance training;Neuromuscular re-education;Stair training;Functional mobility training;Patient/family education;Therapeutic activities;Therapeutic exercise   PT Goals Acute Rehab PT Goals PT Goal Formulation: With patient Time For Goal Achievement: 07/03/12 Potential to Achieve Goals: Good Pt will go Sit to Stand: with modified independence PT Goal: Sit to Stand - Progress: Goal set today Pt will go Stand to Sit: with modified independence PT Goal: Stand to Sit - Progress: Goal set today Pt will Stand: with modified independence;3 - 5 min;with unilateral upper extremity support PT Goal: Stand - Progress: Goal set today Pt will Ambulate: >150 feet;Independently PT Goal: Ambulate - Progress: Goal set today Pt will Go Up / Down Stairs: Flight;with rail(s);with modified independence PT Goal: Up/Down Stairs - Progress: Goal set today Pt will Perform Home Exercise Program: Independently PT Goal: Perform Home Exercise Program - Progress: Goal set today  Visit Information  Last PT Received On: 06/26/12 Assistance Needed: +1    Subjective Data  Subjective: Patient describes symptoms as spinning that started middle of the night and woke her up and have continued.  Feels it is better now than it was.  Denies nausea or hearing changes or any falls.  Feels off balance on her feet.  States has not had this problem  in the past.  Does not wear glasses.   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 Communication Communication: No difficulties    Cognition  Cognition Arousal/Alertness: Lethargic Behavior During Therapy: Flat affect Overall Cognitive Status: Within Functional Limits for tasks assessed    Extremity/Trunk Assessment Right Lower Extremity Assessment RLE ROM/Strength/Tone: [facility] for tasks assessed Left Lower Extremity Assessment LLE ROM/Strength/Tone: [facility] for tasks assessed   Balance Standardized Balance Assessment Standardized Balance Assessment: Dynamic Gait Index Dynamic Gait Index Level Surface: Moderate Impairment Change in Gait Speed: Moderate Impairment Gait with Horizontal Head Turns: Mild Impairment Gait with Vertical Head Turns: Mild Impairment Gait and Pivot Turn: Mild Impairment Step Over Obstacle: Moderate Impairment Step Around Obstacles: Normal Steps:  (deferred due to fatigue)  End of Session PT - End of Session Equipment Utilized During Treatment: Gait belt Activity Tolerance: Patient limited by fatigue Patient left: in bed;with call bell/phone within reach  GP     [facility] 06/26/2012, 5:06 PM [place],  [phone] 06/26/2012

## 2012-06-26 NOTE — ED Notes (Signed)
Pt returned from MRI, states headache improved, nausea improved. No needs at this time.

## 2012-06-26 NOTE — ED Provider Notes (Addendum)
MSE was initiated and I personally evaluated the patient and placed orders (if any) at  12:00 PM on June 26, 2012.  The patient appears stable so that the remainder of the MSE may be completed by another provider.  Patient presents to transfer from [facility]. MRI ordered for vertigo. She feels better after Compazine given and her headache resolved. Ears are reassuring. Neuro exam was nonfocal. Finger-nose is intact bilaterally she had no nystagmus. Patient has some mild hypokalemia but his been supplemented and will be supplemented as an outpatient. She also has a UTI and will be treated. She'll follow with her primary care physician. MRI did not show neurologic cause of vertigo  Juliet Rude. Rubin Payor, MD 06/26/12 1201  Patient was stood to ambulate and she was unable to do it. She's had Antivert, Valium, and Compazine. She'll be admitted to medicine for further evaluation and treatment.  Juliet Rude. Rubin Payor, MD 06/26/12 1239

## 2012-06-26 NOTE — ED Notes (Signed)
Admission MD at bedside.  

## 2012-06-26 NOTE — ED Notes (Signed)
Patient transported to MRI 

## 2012-06-26 NOTE — ED Notes (Signed)
MD at bedside. 

## 2012-06-26 NOTE — ED Notes (Signed)
Report given to Courtney, RN.

## 2012-06-26 NOTE — ED Notes (Signed)
Went to discharge pt and while pt was sitting on side of bed, became very dizzy and nauseated again, fell back on to bed due to vertigo. MD Rubin Payor made aware and will make arrangements for admission.

## 2012-06-26 NOTE — Telephone Encounter (Signed)
Refill- hydrochlorothiazide 25mg  tab. Take one tablet by mouth every day. Qty 90 last fill 3.12.14  Refill- carvedilol 6.25mg  tablets. Take one tablet by mouth twice daily with a meal. Qty 180 last fill 3.11.14

## 2012-06-26 NOTE — ED Notes (Signed)
Patient arrived by EMS with acute episode of dizziness. Reports that she was awakened from sleep with the room spinning. Reports that she then became very nauseated and started vomiting, reports that her head feels funny. Speech clear, no neuro deficits noted. Received zofran 8mg  pta

## 2012-06-26 NOTE — Progress Notes (Signed)
Unit CM UR Completed by [facility] ED CM  W. Branko Steeves RN  

## 2012-06-26 NOTE — H&P (Signed)
[facility] History and Physical  Brenda Marsh ZOX:[id number] DOB: [birth year] DOA: 06/26/2012  Referring physician: ED PCP: Danise Edge, MD    Chief Complaint:  Chief Complaint  Patient presents with  . Dizziness     HPI: Brenda Marsh is a 67 y.o. female with past medical history of hypertension presented to the emergency room last night with sudden onset dizziness. She describes sensation of spinning that is worse with head movement and disappears when closing her eyes. MRI of the brain is negative for cerebellar stroke. Patient reports sensation of increased sinus fullness lately. She was treated emergency room with Valium, meclizine and IV fluids but she continued to remain very symptomatic. He also was found to have a low potassium level. She is placed on observation for further management of her symptoms.    Review of Systems: The patient denies anorexia, fever, weight loss,, vision loss, decreased hearing, hoarseness, chest pain, syncope, dyspnea on exertion, peripheral edema,hemoptysis, abdominal pain, melena, hematochezia, severe indigestion/heartburn, hematuria, incontinence, genital sores, muscle weakness, suspicious skin lesions, transient blindness, difficulty walking, depression, unusual weight change, abnormal bleeding, enlarged lymph nodes, angioedema, and breast masses.   Past Medical History  Diagnosis Date  . Nonischemic cardiomyopathy     improved by most recent echocardiogram  . LBBB (left bundle branch block)   . Cancer 2009  . Colon polyp     unclear pathology  . Hyperlipidemia   . Hypertension   . Arthritis     left knee pain, MRI in 2008-tricompartmental  degenerate changes, mucoid degeneration of ACL, post horn meniscal tear and ant horn meniscal tear  . Low back pain     left L3-4 injected  . Unspecified constipation 04/22/2012   Past Surgical History  Procedure Laterality Date  . Appendectomy  2009    Dr. Ezzard Standing  . Carpal tunnel  release  2007    Dr. Teressa Senter  . Abdominal hysterectomy    . Breast surgery      lumpectomy  . Tonsillectomy    . Knee arthroscopy      Right   Social History:  reports that she quit smoking about 42 years ago. She has never used smokeless tobacco. She reports that she does not drink alcohol. Her drug history is not on file. Lives at home with family  Allergies  Allergen Reactions  . Contrast Media (Iodinated Diagnostic Agents)     N&V  . Lisinopril     REACTION: cough  . Naproxen Sodium     REACTION: Breathing difficulty  . Penicillins     REACTION: Welps  . Simvastatin     REACTION: muscle aches    Family History  Problem Relation Age of Onset  . Heart attack Mother 68  . Diabetes Sister   . Diabetes Brother   . Coronary artery disease Other     Female first degree relative <60  . Hyperlipidemia Other   . Hypertension Other   . Cancer Other     prostate, 1st degree relative <50     Prior to Admission medications   Medication Sig Start Date End Date Taking? Authorizing Provider  candesartan (ATACAND) 16 MG tablet Take 16 mg by mouth daily.   Yes Historical Provider, MD  carvedilol (COREG) 6.25 MG tablet Take 1 tablet (6.25 mg total) by mouth 2 (two) times daily with a meal. 09/27/11  Yes Edwyna Perfect, MD  hydrochlorothiazide (HYDRODIURIL) 25 MG tablet Take 1 tablet (25 mg total) by mouth daily. 09/27/11  Yes Edwyna Perfect, MD  potassium chloride SA (K-DUR,KLOR-CON) 20 MEQ tablet Take 1 tablet (20 mEq total) by mouth 2 (two) times daily. 06/26/12   Juliet Rude. Pickering, MD  prochlorperazine (COMPAZINE) 10 MG tablet Take 1 tablet (10 mg total) by mouth every 8 (eight) hours as needed (headache). 06/26/12   Juliet Rude. Pickering, MD  sulfamethoxazole-trimethoprim (BACTRIM DS,SEPTRA DS) 800-160 MG per tablet Take 1 tablet by mouth 2 (two) times daily. 06/26/12   Juliet Rude. Rubin Payor, MD   Physical Exam: Filed Vitals:   06/26/12 0845 06/26/12 0930 06/26/12 1132 06/26/12 1200  BP:  111/55 118/45 100/50 108/55  Pulse: 68 65 60 57  Temp:      TempSrc:      Resp: 12 13    SpO2: 99% 100% 98% 99%     General:  Alert and oriented x3  Eyes: Nystagmus, extraocular movement is intact, pupils are 3 mm symmetric and reactive to light  ENT: Clear pharynx without JVD  Neck: No JVD  Cardiovascular: Regular rate and rhythm without murmurs rubs or gallops  Respiratory: Clear to auscultation bilaterally  Abdomen: Soft nontender, no palpable hepatosplenomegaly, bowel sounds present  Skin: Warm dry without rashes  Musculoskeletal: Intact  Psychiatric: Euthymic  Neurologic: Finger to nose intact, strength 5 out of 5 in all 4 extremities, sensation intact  Labs on Admission:  Basic Metabolic Panel:  Recent Labs Lab 06/26/12 0327  NA 140  K 2.8*  CL 104  CO2 26  GLUCOSE 115*  BUN 12  CREATININE 0.80  CALCIUM 9.7   Liver Function Tests:  Recent Labs Lab 06/26/12 0327  AST 18  ALT 15  ALKPHOS 74  BILITOT 0.2*  PROT 7.3  ALBUMIN 3.2*   No results found for this basename: LIPASE, AMYLASE,  in the last 168 hours No results found for this basename: AMMONIA,  in the last 168 hours CBC:  Recent Labs Lab 06/26/12 0327  WBC 7.2  HGB 11.1*  HCT 32.8*  MCV 88.4  PLT 257   Cardiac Enzymes: No results found for this basename: CKTOTAL, CKMB, CKMBINDEX, TROPONINI,  in the last 168 hours  BNP (last 3 results) No results found for this basename: PROBNP,  in the last 8760 hours CBG: No results found for this basename: GLUCAP,  in the last 168 hours  Radiological Exams on Admission: Mr Brain Wo Contrast  06/26/2012   *RADIOLOGY REPORT*  Clinical Data: Dizziness  MRI HEAD WITHOUT CONTRAST  Technique:  Multiplanar, multiecho pulse sequences of the brain and surrounding structures were obtained according to standard protocol without intravenous contrast.  Comparison: CT 07/14/2009, MRI 01/14/2004  Findings: Negative for acute infarct.  5 mm hyperintensity  left frontal white matter is similar to MRI 08/2003.  This is nonspecific and may be related to chronic microvascular ischemia.  Negative for hemorrhage or mass.  No edema or midline shift. Ventricle size is normal.  Paranasal sinuses are clear.  Vessels at the base of the brain are patent.  IMPRESSION: No acute abnormality and no change from 2005.   Original Report Authenticated By: Janeece Riggers, M.D.      Assessment/Plan Principal Problem:   Vertigo, benign positional Active Problems:   HYPERLIPIDEMIA   HYPERTENSION   GERD   BREAST CANCER, HX OF   Hypokalemia   UTI (lower urinary tract infection)   1. Vertigo-peripheral given constellation of symptoms, negative MRI. We'll treat with meclizine, prednisone, antiemetics. Will obtain physical therapy consultation for vestibular rehabilitation 2. Hypokalemia-from  HCTZ-replete and monitor patient on telemetry. Recheck basic metabolic profile in the morning. Add magnesium level to labs prior collected 3. HTN - check orthostatics - patient may have 1 too many BP meds UTI - start rocephin   Brenda Marsh [facility] Pager [phone]  If 7PM-7AM, please contact night-coverage [email or web address] Password [facility] 06/26/2012, 12:54 PM

## 2012-06-27 DIAGNOSIS — R42 Dizziness and giddiness: Secondary | ICD-10-CM

## 2012-06-27 DIAGNOSIS — I10 Essential (primary) hypertension: Secondary | ICD-10-CM

## 2012-06-27 LAB — BASIC METABOLIC PANEL
CO2: 26 mEq/L (ref 19–32)
GFR calc non Af Amer: 88 mL/min — ABNORMAL LOW (ref >90)
Glucose, Bld: 94 mg/dL (ref 70–99)
Potassium: 3.8 mEq/L (ref 3.5–5.1)
Sodium: 140 mEq/L (ref 135–145)

## 2012-06-27 MED ORDER — PROMETHAZINE HCL 12.5 MG PO TABS: 12.5000 mg | ORAL_TABLET | Freq: Four times a day (QID) | ORAL | Status: DC | PRN

## 2012-06-27 MED ORDER — CIPROFLOXACIN HCL 500 MG PO TABS: 500.0000 mg | ORAL_TABLET | Freq: Two times a day (BID) | ORAL | Status: DC

## 2012-06-27 MED ORDER — CIPROFLOXACIN HCL 750 MG PO TABS: 750.0000 mg | ORAL_TABLET | Freq: Two times a day (BID) | ORAL | Status: DC

## 2012-06-27 MED ORDER — MECLIZINE HCL 25 MG PO TABS: 25.0000 mg | ORAL_TABLET | Freq: Three times a day (TID) | ORAL | Status: DC | PRN

## 2012-06-27 MED ORDER — MECLIZINE HCL 12.5 MG PO TABS: 12.5000 mg | ORAL_TABLET | Freq: Once | ORAL | Status: DC

## 2012-06-27 MED ORDER — PREDNISONE 10 MG PO TABS: 40.0000 mg | ORAL_TABLET | Freq: Every day | ORAL | Status: DC

## 2012-06-27 NOTE — Plan of Care (Signed)
Problem: Phase II Progression Outcomes Goal: Discharge plan established Outcome: Completed/Met Date Met:  06/27/12 Home with H/H PT

## 2012-06-27 NOTE — Plan of Care (Signed)
Problem: Phase I Progression Outcomes Goal: Initial discharge plan identified Outcome: Completed/Met Date Met:  06/27/12 To return home     

## 2012-06-27 NOTE — Care Management Note (Signed)
    Page 1 of 2   06/28/2012     10:40:11 AM   CARE MANAGEMENT NOTE 06/28/2012  Patient:  ZOXWRUE,[id number]   Account Number:  [id number]  Date Initiated:  06/27/2012  Documentation initiated by:  Letha Cape  Subjective/Objective Assessment:   dx vertigo, uti, hypokalemia  admit-lives alone. pta indep.     Action/Plan:   pt eval- rec vestibular hhpt   Anticipated DC Date:  06/28/2012   Anticipated DC Plan:  HOME W HOME HEALTH SERVICES      DC Planning Services  CM consult      [facility] Choice  HOME HEALTH   Choice offered to / List presented to:  C-1 Patient        HH arranged  HH-2 PT      [facility] agency  [facility]   Status of service:  Completed, signed off Medicare Important Message given?   (If response is "NO", the following Medicare IM given date fields will be blank) Date Medicare IM given:   Date Additional Medicare IM given:    Discharge Disposition:  HOME W HOME HEALTH SERVICES  Per UR Regulation:  Reviewed for med. necessity/level of care/duration of stay  If discussed at Long Length of Stay Meetings, dates discussed:    Comments:  06/28/12 10:38 Letha Cape patient is for dc today, notified Corrie Dandy with [facility].  06/27/12 11:35 Letha Cape RN, BSN [phone] patient lives alone, per physical therapy recs hh vestibular pt.  Patient became nauseated and had emesis this am after working with physical therapy.  [facility] chose [facility] from the agency list, referral made to [facility] notified by vm for hhpt (vestibular).  Soc will begin 24-48 hrs post discharge. Patient for dc today.

## 2012-06-27 NOTE — Progress Notes (Signed)
Physical Therapy G-Code Note  07/01/12 1229  PT G-Codes **NOT FOR INPATIENT CLASS**  Functional Assessment Tool Used clinical observation  Functional Limitation Mobility: Walking and moving around  Mobility: Walking and Moving Around Current Status [phone]) CJ  Mobility: Walking and Moving Around Goal Status [phone]) CI  [place], [place] [phone] 06/27/2012

## 2012-06-27 NOTE — Progress Notes (Signed)
Physical Therapy Treatment Patient Details Name: Brenda Marsh MRN: [id number] DOB: February 22, 1945 Today's Date: 06/27/2012 Time: 4098-1191 PT Time Calculation (min): 26 min  PT Assessment / Plan / Recommendation Comments on Treatment Session  Patient with left posterior canal BPPV, not fixed today wtih Eply's x 1.  May need Semont maneuver for cupulolithiasis.  Feel she will need at least initial 24 hour assist available, states she will have.  Reports performing x 1 viewing exercises.  Feel best to have initial HHPT follow up for continued vestibular rehab.  Will defer any equipment needs to HHPT as unable to ambulate patient after vomiting episode this am.  Will follow up if not discharged today.    Follow Up Recommendations  Home health PT;Supervision - Intermittent           Equipment Recommendations  Other (comment) (TBA by HHPT)       Frequency Min 3X/week   Plan Discharge plan remains appropriate    Precautions / Restrictions Precautions Precautions: Fall   Pertinent Vitals/Pain No pain complaints    Mobility  Bed Mobility Supine to Sit: 6: Modified independent (Device/Increase time) Sit to Supine: 4: Min assist Details for Bed Mobility Assistance: upon return to supine needed increased assist due to nausea and vomiting    Vestibular treatment  Other Exercises Other Exercises: performed modified hall pike dix positive for left rotary nystagmus in left sidelying position.  Performed right dix hall pike negative for nystagmus.  Left dix hallpike positive for persistent rotary nystagmus greater than 45 seconds.  Performed Eply's repositioning x 1 with positive symptoms in last position.  Pt with nausea sitting. Performed seated head thrust test, negative for refixation bilaterally, postive for increased nausea and vomiting.  RN made aware and pt setted back in bed.     PT Goals Additional Goals Additional Goal #1: Patient to demonstrate no symptoms in left hall pike  position PT Goal: Additional Goal #1 - Progress: Goal set today  Visit Information  Last PT Received On: 06/27/12    Subjective Data  Subjective: Feeling a little better   Cognition  Cognition Arousal/Alertness: Awake/alert Behavior During Therapy: WFL for tasks assessed/performed Overall Cognitive Status: Within Functional Limits for tasks assessed    Balance     End of Session PT - End of Session Activity Tolerance: Other (comment) (limited by N&V) Patient left: in bed;with call bell/phone within reach;with bed alarm set Nurse Communication: Other (comment) (need for nausea medication)   GP     [facility] 06/27/2012, 12:30 PM Brenda Marsh, PT [phone] 06/27/2012

## 2012-06-27 NOTE — Discharge Summary (Addendum)
Physician Discharge Summary  Brenda Marsh:[id number] DOB: [birth year] DOA: 06/26/2012  PCP: Danise Edge, MD  Admit date: 06/26/2012 Discharge date: 06/28/2012  Time spent: 40 minutes  Addendum:  Patient was originally planned for d/c on 6/10 but had vomiting when working with PT.  Continued to feel dizzy and unsteady in the afternoon.  Was monitored overnight and is doing well today.  Safe for d/c to home.   Recommendations for Outpatient Follow-up:  Home Health PT for further vestibular function exercises and improvement.  Primary care physician follow up for orthostatic hypotension and BP medication mgmt.  Patient was taken off of two of her BP Meds.   Discharge Diagnoses:  Principal Problem:   Vertigo, benign positional Active Problems:   HYPERLIPIDEMIA   HYPERTENSION   GERD   BREAST CANCER, HX OF   Hypokalemia   UTI (lower urinary tract infection)   Discharge Condition: Still somewhat symptomatic but stable for discharge with 24 hour monitoring at home   Diet recommendation:  Regular diet  Filed Weights   06/26/12 1407 06/27/12 0651 06/28/12 0631  Weight: 94.303 kg (207 lb 14.4 oz) 93.169 kg (205 lb 6.4 oz) 93.123 kg (205 lb 4.8 oz)    History of present illness:  Brenda Marsh is a 67 y.o. female with past medical history of hypertension presented to the emergency room last night with sudden onset dizziness. She describes sensation of spinning that is worse with head movement and disappears when closing her eyes. MRI of the brain is negative for cerebellar stroke. Patient reports sensation of increased sinus fullness lately. She was treated emergency room with Valium, meclizine and IV fluids but she continued to remain very symptomatic.  She also was found to have a low potassium level. She is placed on observation for further management of her symptoms.    Hospital Course:  Vertigo - benign positional vertigo MRI brain is negative for cerebellar  stroke Treated with meclizine and compazine with some improvement. Evaluated by Physical Therapy who performed Eply's maneuvers.  Home health physical therapy has been ordered for further vestibular function therapy.    Hypokalemia Likely from HCTZ. Repleted. Serum K is 3.8 on D/C.  Serum Magnesium is 1/9.  Hypertension Blood pressure would rise on standing up  HCTZ and Atacand were discontinued until the patient follows up with her PCP.   Coreg will be continued at discharge.Her BP is controlled well with just Coreg.  Urinary Tract Infection Urine culture; positive for nitrites, WBC, bacteria.  Treated with Rocephin.  Urine culture shows Klebsiella Oxytoca.  Will be discharged with a short course of Cipro.            Discharge Exam: Filed Vitals:   06/27/12 0651 06/27/12 1706 06/27/12 2059 06/28/12 0631  BP:  118/64 116/63 120/71  Pulse:  68 72 71  Temp:  98.3 F (36.8 C) 98.1 F (36.7 C) 98.5 F (36.9 C)  TempSrc:  Oral Oral Oral  Resp:  18 18 20   Height:      Weight: 93.169 kg (205 lb 6.4 oz)   93.123 kg (205 lb 4.8 oz)  SpO2:  98% 97% 99%   General: Patient awake, A&O x 3, in no acute distress, walking about the room with a steady gait. HEENT: no nystagmus noted, moist mucus membranes  Cardiovascular: RRR, without mgr.  Respiratory: clear to auscultation bilaterally, without wheezes, rhonchi or rales Abdomen: soft, nontender, nondistended, normoactive bs  Musculoskeletal: 5/5 strength in all 4 extremities  Neurological: sensation intact  Rash: None, without bruising    Discharge Instructions      Discharge Orders   Future Appointments Provider Department Dept Phone   07/17/2012 8:15 AM Bradd Canary, MD [facility]  [facility] [phone]   05/28/2013 10:00 AM Rachael Fee [facility] [place] [phone]   05/28/2013 10:30 AM Josph Macho, MD [facility] [place] [phone]   Future Orders Complete  By Expires     Diet - low sodium heart healthy  As directed     Diet - low sodium heart healthy  As directed     Increase activity slowly  As directed     Increase activity slowly  As directed         Medication List    STOP taking these medications       candesartan 16 MG tablet  Commonly known as:  ATACAND      TAKE these medications       carvedilol 6.25 MG tablet  Commonly known as:  COREG  Take 1 tablet (6.25 mg total) by mouth 2 (two) times daily with a meal.     ciprofloxacin 500 MG tablet  Commonly known as:  CIPRO  Take 1 tablet (500 mg total) by mouth 2 (two) times daily.     meclizine 25 MG tablet  Commonly known as:  ANTIVERT  Take 1 tablet (25 mg total) by mouth 3 (three) times daily as needed for dizziness or nausea.     potassium chloride SA 20 MEQ tablet  Commonly known as:  K-DUR,KLOR-CON  Take 1 tablet (20 mEq total) by mouth 2 (two) times daily.     predniSONE 10 MG tablet  Commonly known as:  DELTASONE  Take 4 tablets (40 mg total) by mouth daily with breakfast. On 6/11, then 3 tabs with breakfast on 6/12, 6/13.  Then 2 tabs with breakfast on 6/14, 6/15.  Then 1 tab with breakfast on 6/16 and 6/17 then stop.     promethazine 12.5 MG tablet  Commonly known as:  PHENERGAN  Take 1 tablet (12.5 mg total) by mouth every 6 (six) hours as needed for nausea.       Allergies  Allergen Reactions  . Contrast Media (Iodinated Diagnostic Agents)     N&V  . Lisinopril     REACTION: cough  . Naproxen Sodium     REACTION: Breathing difficulty  . Penicillins     REACTION: Welps  . Simvastatin     REACTION: muscle aches   Follow-up Information   Follow up with Danise Edge, MD. Schedule an appointment as soon as possible for a visit in 1 week. (Make an appt regarding your blood pressure medications for 1 week from now.)    Contact information:   [address] [place] [address] [phone]        The results of significant  diagnostics from this hospitalization (including imaging, microbiology, ancillary and laboratory) are listed below for reference.    Significant Diagnostic Studies: Mr Brain Wo Contrast  06/26/2012   *RADIOLOGY REPORT*  Clinical Data: Dizziness  MRI HEAD WITHOUT CONTRAST  Technique:  Multiplanar, multiecho pulse sequences of the brain and surrounding structures were obtained according to standard protocol without intravenous contrast.  Comparison: CT 07/14/2009, MRI 01/14/2004  Findings: Negative for acute infarct.  5 mm hyperintensity left frontal white matter is similar to MRI 08/2003.  This is  nonspecific and may be related to chronic microvascular ischemia.  Negative for hemorrhage or mass.  No edema or midline shift. Ventricle size is normal.  Paranasal sinuses are clear.  Vessels at the base of the brain are patent.  IMPRESSION: No acute abnormality and no change from 2005.   Original Report Authenticated By: Janeece Riggers, M.D.    Microbiology: Recent Results (from the past 240 hour(s))  URINE CULTURE     Status: None   Collection Time    06/26/12  9:06 AM      Result Value Range Status   Specimen Description URINE, RANDOM   Final   Special Requests NONE   Final   Culture  Setup Time 06/26/2012 18:44   Final   Colony Count >=100,000 COLONIES/ML   Final   Culture KLEBSIELLA OXYTOCA   Final   Report Status 06/28/2012 FINAL   Final   Organism ID, Bacteria KLEBSIELLA OXYTOCA   Final     Labs: Basic Metabolic Panel:  Recent Labs Lab 06/26/12 0327 06/26/12 1303 06/27/12 0540  NA 140  --  140  K 2.8*  --  3.8  CL 104  --  109  CO2 26  --  26  GLUCOSE 115*  --  94  BUN 12  --  9  CREATININE 0.80  --  0.70  CALCIUM 9.7  --  9.2  MG  --  1.9  --    Liver Function Tests:  Recent Labs Lab 06/26/12 0327  AST 18  ALT 15  ALKPHOS 74  BILITOT 0.2*  PROT 7.3  ALBUMIN 3.2*   CBC:  Recent Labs Lab 06/26/12 0327  WBC 7.2  HGB 11.1*  HCT 32.8*  MCV 88.4  PLT 257     Signed:  Algis Downs, PA-C [facility] Pager: [phone]

## 2012-06-28 DIAGNOSIS — M549 Dorsalgia, unspecified: Secondary | ICD-10-CM

## 2012-06-28 DIAGNOSIS — K219 Gastro-esophageal reflux disease without esophagitis: Secondary | ICD-10-CM

## 2012-06-28 LAB — URINE CULTURE: Colony Count: >=100000

## 2012-06-28 MED ORDER — CIPROFLOXACIN HCL 500 MG PO TABS: 500.0000 mg | ORAL_TABLET | Freq: Two times a day (BID) | ORAL | Status: DC

## 2012-06-28 NOTE — Progress Notes (Signed)
[facility] PROGRESS NOTE  Brenda Marsh:[id number] DOB: February 11, 1945 DOA: 06/26/2012 PCP: Danise Edge, MD  Assessment/Plan: Principal Problem:  Vertigo, Benign Positional:  -MRI w/o contrast done in ED; negative for acute infarct  -treatment with meclizine, prednisone, antiemetics   -dizziness is under control -HHPT after discharge  Urinary Tract Infection:  -Urine culture; positive for nitrites, WBC, klebsiella bacteria -three doses of  Rocephin prior to discharge -transition to Cipro for 2 days at home  Active Problems:   Cardiac Arrhythmia:  -Carvedilol administered   Hypertension:  -discontinued HCTZ and Atacand during hospital stay -follow-up with PCP for BP management  -Coreg will be continued at discharge  Hypokalemia:  -potassium back to baseline at 3.8  Hyperlipidemia  GERD  Breast Cancer, hx of   Code Status: Full Family Communication: none at bedside Disposition Plan: discharge, since patient stable.   Consultants:  PT for HHPT  Procedures: - none today  Antibiotics:  Rocephin x 3 days - one more dose before discharge today  Ciprofloxacin - begin tomorrow 06/29/2012 for two days  HPI/Subjective: Patient admitted from ED on Sunday night for acute onset dizziness that worsens with movement.  Hypokalemia was also found on lab work in ED.  Patient had PT vestibular rehab yesterday, however had nausea and vomiting as a result of increased dizziness from the Epley and [employer] maneuvers. Left posterior canal nystagmus was noted during maneuvers.  Patient feels remarkably better this morning, without dizziness, nausea or vomiting.  Patient agrees to continue with HHPT for vestibular rehab once discharged.  Objective: Filed Vitals:   06/27/12 0651 06/27/12 1706 06/27/12 2059 06/28/12 0631  BP:  118/64 116/63 120/71  Pulse:  68 72 71  Temp:  98.3 F (36.8 C) 98.1 F (36.7 C) 98.5 F (36.9 C)  TempSrc:  Oral Oral Oral  Resp:  18  18 20   Height:      Weight: 93.169 kg (205 lb 6.4 oz)   93.123 kg (205 lb 4.8 oz)  SpO2:  98% 97% 99%    Intake/Output Summary (Last 24 hours) at 06/28/12 1025 Last data filed at 06/28/12 0910  Gross per 24 hour  Intake    855 ml  Output      0 ml  Net    855 ml   Filed Weights   06/26/12 1407 06/27/12 0651 06/28/12 0631  Weight: 94.303 kg (207 lb 14.4 oz) 93.169 kg (205 lb 6.4 oz) 93.123 kg (205 lb 4.8 oz)    Exam:   General:  Patient awake, A&O x 3, without acute distress.  Cardiovascular: RRR, without mgr  Respiratory: lungs clear to auscultation bilaterally, without wheezes, rhonchi, or rales.  Abdomen: soft, nontender, nondistended, normoactive BS  Musculoskeletal: active ROM in all 4 extremities with 5/5 strength  Neurological: no nystagmus noted   Data Reviewed: Basic Metabolic Panel:  Recent Labs Lab 06/26/12 0327 06/26/12 1303 06/27/12 0540  NA 140  --  140  K 2.8*  --  3.8  CL 104  --  109  CO2 26  --  26  GLUCOSE 115*  --  94  BUN 12  --  9  CREATININE 0.80  --  0.70  CALCIUM 9.7  --  9.2  MG  --  1.9  --    Liver Function Tests:  Recent Labs Lab 06/26/12 0327  AST 18  ALT 15  ALKPHOS 74  BILITOT 0.2*  PROT 7.3  ALBUMIN 3.2*   CBC:  Recent Labs Lab  06/26/12 0327  WBC 7.2  HGB 11.1*  HCT 32.8*  MCV 88.4  PLT 257    Recent Results (from the past 240 hour(s))  URINE CULTURE     Status: None   Collection Time    06/26/12  9:06 AM      Result Value Range Status   Specimen Description URINE, RANDOM   Final   Special Requests NONE   Final   Culture  Setup Time 06/26/2012 18:44   Final   Colony Count >=100,000 COLONIES/ML   Final   Culture KLEBSIELLA OXYTOCA   Final   Report Status 06/28/2012 FINAL   Final   Organism ID, Bacteria KLEBSIELLA OXYTOCA   Final     Studies: No results found.  Scheduled Meds: . carvedilol  6.25 mg Oral BID WC  . cefTRIAXone (ROCEPHIN)  IV  1 g Intravenous Q24H  . enoxaparin (LOVENOX)  injection  40 mg Subcutaneous QHS  . meclizine  12.5 mg Oral Once  . predniSONE  40 mg Oral Q breakfast  . sodium chloride  3 mL Intravenous Q12H  . sodium chloride  3 mL Intravenous Q12H   Continuous Infusions: . sodium chloride Stopped (06/26/12 1938)    Principal Problem:   Vertigo, benign positional Active Problems:   HYPERLIPIDEMIA   HYPERTENSION   GERD   BREAST CANCER, HX OF   Hypokalemia   UTI (lower urinary tract infection)    Algis Downs, PA-C [facility] Pager: [phone]   Tama Gander PA-S [facility] 06/28/2012, 10:25 AM  LOS: 2 days

## 2012-06-28 NOTE — Progress Notes (Signed)
Physical Therapy Treatment Patient Details Name: TERIANNA PEGGS MRN: [id number] DOB: [birth year] Today's Date: 06/28/2012 Time: 4098-1191 PT Time Calculation (min): 27 min  PT Assessment / Plan / Recommendation Comments on Treatment Session  Patient with close to full symptom resolution for  left posterior canal BPPV. Reports performing x 1 viewing exercises.  Feel best to have initial HHPT follow up for continued vestibular rehab.      Follow Up Recommendations  Home health PT;Supervision - Intermittent                 Equipment Recommendations  None recommended by PT        Frequency Min 3X/week   Plan Discharge plan remains appropriate;Frequency remains appropriate    Precautions / Restrictions Precautions Precautions: None Restrictions Weight Bearing Restrictions: No   Pertinent Vitals/Pain VSS, no pain    Mobility  Bed Mobility Bed Mobility: Not assessed Transfers Transfers: Stand to Sit;Sit to Stand Sit to Stand: 7: Independent Stand to Sit: 7: Independent Ambulation/Gait Ambulation/Gait Assistance: 5: Supervision Ambulation Distance (Feet): 300 Feet Assistive device: None Ambulation/Gait Assistance Details: Steady  with no significant LOB.   Gait Pattern: Within Functional Limits Stairs: No Wheelchair Mobility Wheelchair Mobility: No    Exercises Other Exercises Other Exercises: Reviewed x1 exercises with pt.  Pt said symptoms much better and did not want to do Epley maneuver again therefore educated in [place] exercise and gave handouts to explain BPPV and Francee Piccolo.     PT Goals Acute Rehab PT Goals Pt will go Sit to Stand: with modified independence PT Goal: Sit to Stand - Progress: Progressing toward goal Pt will go Stand to Sit: with modified independence PT Goal: Stand to Sit - Progress: Progressing toward goal Pt will Stand: with modified independence;3 - 5 min;with unilateral upper extremity support PT Goal: Stand - Progress: Progressing  toward goal Pt will Ambulate: >150 feet;Independently PT Goal: Ambulate - Progress: Progressing toward goal Pt will Perform Home Exercise Program: Independently PT Goal: Perform Home Exercise Program - Progress: Met Additional Goals Additional Goal #1: Patient to demonstrate no symptoms in left hall pike position PT Goal: Additional Goal #1 - Progress: Met  Visit Information  Last PT Received On: 06/28/12 Assistance Needed: +1    Subjective Data  Subjective: "I feel better. "   Cognition  Cognition Arousal/Alertness: Awake/alert Behavior During Therapy: WFL for tasks assessed/performed Overall Cognitive Status: Within Functional Limits for tasks assessed    Balance  High Level Balance High Level Balance Activites: Direction changes;Turns;Sudden stops;Head turns High Level Balance Comments: no problems with these activities  End of Session PT - End of Session Equipment Utilized During Treatment: Gait belt Activity Tolerance: Patient tolerated treatment well Patient left: in chair;with call bell/phone within reach Nurse Communication: Mobility status       INGOLD,Wanya Bangura 06/28/2012, 1:38 PM [facility] Acute Rehabilitation [phone] (pager)

## 2012-06-28 NOTE — Discharge Summary (Signed)
Addendum  Patient seen and examined, chart and data base reviewed.  I agree with the above assessment and discharge plan.  For full details please see Mrs. Algis Downs PA note.   Clint Lipps, MD [facility] Pager: [phone] 06/28/2012, 1:21 PM

## 2012-06-28 NOTE — Progress Notes (Signed)
NURSING PROGRESS NOTE  Brenda Marsh [id number] Discharge Data: 06/28/2012 2:10 PM Attending Provider: Maretta Bees, MD NWG:NFAOZ, Misty Stanley, MD     Posey Pronto to be D/C'd Home per MD order.    All IV's discontinued with no bleeding noted.  All belongings returned to patient for patient to take home.   Last Vital Signs:  Blood pressure 120/71, pulse 71, temperature 98.5 F (36.9 C), temperature source Oral, resp. rate 20, height 5\' 6"  (1.676 m), weight 93.123 kg (205 lb 4.8 oz), SpO2 99.00%.  Discharge Medication List   Medication List    STOP taking these medications       candesartan 16 MG tablet  Commonly known as:  ATACAND      TAKE these medications       carvedilol 6.25 MG tablet  Commonly known as:  COREG  Take 1 tablet (6.25 mg total) by mouth 2 (two) times daily with a meal.     ciprofloxacin 500 MG tablet  Commonly known as:  CIPRO  Take 1 tablet (500 mg total) by mouth 2 (two) times daily.     meclizine 25 MG tablet  Commonly known as:  ANTIVERT  Take 1 tablet (25 mg total) by mouth 3 (three) times daily as needed for dizziness or nausea.     potassium chloride SA 20 MEQ tablet  Commonly known as:  K-DUR,KLOR-CON  Take 1 tablet (20 mEq total) by mouth 2 (two) times daily.     predniSONE 10 MG tablet  Commonly known as:  DELTASONE  Take 4 tablets (40 mg total) by mouth daily with breakfast. On 6/11, then 3 tabs with breakfast on 6/12, 6/13.  Then 2 tabs with breakfast on 6/14, 6/15.  Then 1 tab with breakfast on 6/16 and 6/17 then stop.     promethazine 12.5 MG tablet  Commonly known as:  PHENERGAN  Take 1 tablet (12.5 mg total) by mouth every 6 (six) hours as needed for nausea.        Madelin Rear, MSN, RN, [employer]

## 2012-07-13 ENCOUNTER — Ambulatory Visit (INDEPENDENT_AMBULATORY_CARE_PROVIDER_SITE_OTHER): Payer: Medicare Other | Admitting: Family Medicine

## 2012-07-13 ENCOUNTER — Encounter: Payer: Self-pay | Admitting: Family Medicine

## 2012-07-13 VITALS — BP 130/70 | HR 75 | Temp 98.0°F | Ht 65.5 in | Wt 210.0 lb

## 2012-07-13 DIAGNOSIS — D649 Anemia, unspecified: Secondary | ICD-10-CM

## 2012-07-13 DIAGNOSIS — N390 Urinary tract infection, site not specified: Secondary | ICD-10-CM

## 2012-07-13 DIAGNOSIS — R252 Cramp and spasm: Secondary | ICD-10-CM

## 2012-07-13 DIAGNOSIS — N760 Acute vaginitis: Secondary | ICD-10-CM

## 2012-07-13 DIAGNOSIS — R7309 Other abnormal glucose: Secondary | ICD-10-CM

## 2012-07-13 DIAGNOSIS — H8110 Benign paroxysmal vertigo, unspecified ear: Secondary | ICD-10-CM

## 2012-07-13 DIAGNOSIS — I10 Essential (primary) hypertension: Secondary | ICD-10-CM

## 2012-07-13 LAB — MAGNESIUM: Magnesium: 2 mg/dL (ref 1.5–2.5)

## 2012-07-13 LAB — RENAL FUNCTION PANEL
BUN: 9 mg/dL (ref 6–23)
Chloride: 107 mEq/L (ref 96–112)
Glucose, Bld: 67 mg/dL — ABNORMAL LOW (ref 70–99)
Phosphorus: 3.8 mg/dL (ref 2.3–4.6)
Potassium: 4.2 mEq/L (ref 3.5–5.3)
Sodium: 141 mEq/L (ref 135–145)

## 2012-07-13 LAB — CBC
HCT: 33.7 % — ABNORMAL LOW (ref 36.0–46.0)
Hemoglobin: 11.1 g/dL — ABNORMAL LOW (ref 12.0–15.0)
MCH: 28.4 pg (ref 26.0–34.0)
MCHC: 32.9 g/dL (ref 30.0–36.0)
RDW: 16 % — ABNORMAL HIGH (ref 11.5–15.5)

## 2012-07-13 MED ORDER — FLUCONAZOLE 150 MG PO TABS: 150.0000 mg | ORAL_TABLET | ORAL | Status: DC

## 2012-07-13 NOTE — Patient Instructions (Addendum)
Cranberry tabs, AZO   Urinary Tract Infection Urinary tract infections (UTIs) can develop anywhere along your urinary tract. Your urinary tract is your body's drainage system for removing wastes and extra water. Your urinary tract includes two kidneys, two ureters, a bladder, and a urethra. Your kidneys are a pair of bean-shaped organs. Each kidney is about the size of your fist. They are located below your ribs, one on each side of your spine. CAUSES Infections are caused by microbes, which are microscopic organisms, including fungi, viruses, and bacteria. These organisms are so small that they can only be seen through a microscope. Bacteria are the microbes that most commonly cause UTIs. SYMPTOMS  Symptoms of UTIs may vary by age and gender of the patient and by the location of the infection. Symptoms in young women typically include a frequent and intense urge to urinate and a painful, burning feeling in the bladder or urethra during urination. Older women and men are more likely to be tired, shaky, and weak and have muscle aches and abdominal pain. A fever may mean the infection is in your kidneys. Other symptoms of a kidney infection include pain in your back or sides below the ribs, nausea, and vomiting. DIAGNOSIS To diagnose a UTI, your caregiver will ask you about your symptoms. Your caregiver also will ask to provide a urine sample. The urine sample will be tested for bacteria and white blood cells. White blood cells are made by your body to help fight infection. TREATMENT  Typically, UTIs can be treated with medication. Because most UTIs are caused by a bacterial infection, they usually can be treated with the use of antibiotics. The choice of antibiotic and length of treatment depend on your symptoms and the type of bacteria causing your infection. HOME CARE INSTRUCTIONS  If you were prescribed antibiotics, take them exactly as your caregiver instructs you. Finish the medication even if you  feel better after you have only taken some of the medication.  Drink enough water and fluids to keep your urine clear or pale yellow.  Avoid caffeine, tea, and carbonated beverages. They tend to irritate your bladder.  Empty your bladder often. Avoid holding urine for long periods of time.  Empty your bladder before and after sexual intercourse.  After a bowel movement, women should cleanse from front to back. Use each tissue only once. SEEK MEDICAL CARE IF:   You have back pain.  You develop a fever.  Your symptoms do not begin to resolve within 3 days. SEEK IMMEDIATE MEDICAL CARE IF:   You have severe back pain or lower abdominal pain.  You develop chills.  You have nausea or vomiting.  You have continued burning or discomfort with urination. MAKE SURE YOU:   Understand these instructions.  Will watch your condition.  Will get help right away if you are not doing well or get worse. Document Released: 10/14/2004 Document Revised: 07/06/2011 Document Reviewed: 02/12/2011 [facility] Patient Information 2014 [place], [place].

## 2012-07-14 LAB — URINALYSIS
Bilirubin Urine: NEGATIVE
Glucose, UA: NEGATIVE mg/dL
Hgb urine dipstick: NEGATIVE
Ketones, ur: NEGATIVE mg/dL
Protein, ur: NEGATIVE mg/dL
Urobilinogen, UA: 0.2 mg/dL (ref 0.0–1.0)

## 2012-07-16 ENCOUNTER — Encounter: Payer: Self-pay | Admitting: Family Medicine

## 2012-07-16 DIAGNOSIS — D649 Anemia, unspecified: Secondary | ICD-10-CM | POA: Insufficient documentation

## 2012-07-16 NOTE — Assessment & Plan Note (Signed)
Improved since hospital visit. May continue Meclizine prn

## 2012-07-16 NOTE — Assessment & Plan Note (Signed)
Well controlled no changes today 

## 2012-07-16 NOTE — Progress Notes (Signed)
Patient ID: Brenda Marsh, female   DOB: September 05, 1945, 66 y.o.   MRN: [id number] MARIALIZ FERREBEE [id number] 12-20-1945 07/16/2012      Progress Note-Follow Up  Subjective  Chief Complaint  Chief Complaint  Patient presents with  . Follow-up    Hospital follow up - from last week- vertigo and kidney infection    HPI  Patient is 67 year old female who is in today for hospital followup. She recently had to present to the hospital with vertiginous symptoms and was found to have urinary tract infection. She feels much better at this point. At that point she presented to the hospital she was struggling with nausea and even vomiting secondary to the vertigo. Check malaise and myalgias as well as low-grade fevers and urinary frequency. The symptoms of essentially all resolved although she has developed some mild vaginitis with some itching and discharge. No fevers or chills. No chest pain or palpitations. No abdominal or back pain noted at this time. Denies urinary symptoms such as dysuria, hematuria, frequency or urgency at this time. Bowels are moving well and she denies any GI complaints at this time  Past Medical History  Diagnosis Date  . Nonischemic cardiomyopathy     improved by most recent echocardiogram  . LBBB (left bundle branch block)   . Cancer 05-29-2007  . Colon polyp     unclear pathology  . Hyperlipidemia   . Hypertension   . Arthritis     left knee pain, MRI in 2008-tricompartmental  degenerate changes, mucoid degeneration of ACL, post horn meniscal tear and ant horn meniscal tear  . Low back pain     left L3-4 injected  . Unspecified constipation 04/22/2012  . Anemia 07/16/2012    Past Surgical History  Procedure Laterality Date  . Appendectomy  05/29/2007    Dr. Ezzard Standing  . Carpal tunnel release  28-May-2005    Dr. Teressa Senter  . Abdominal hysterectomy    . Breast surgery      lumpectomy  . Tonsillectomy    . Knee arthroscopy      Right    Family History  Problem Relation Age of  Onset  . Heart attack Mother 17  . Diabetes Sister   . Diabetes Brother   . Coronary artery disease Other     Female first degree relative <60  . Hyperlipidemia Other   . Hypertension Other   . Cancer Other     prostate, 1st degree relative <50    History   Social History  . Marital Status: Widowed    Spouse Name: N/A    Number of Children: 2  . Years of Education: N/A   Occupational History  .     Social History Main Topics  . Smoking status: Former Smoker    Quit date: 01/18/1970  . Smokeless tobacco: Never Used  . Alcohol Use: No  . Drug Use: Not on file  . Sexually Active: Not on file   Other Topics Concern  . Not on file   Social History Narrative   Widow - husband died in 05/29/2003   Occupation - retired from working in Museum/gallery curator   2 daughter - on lives in [place], other in [place]   Remote history of tobacco but none in 40 years   Alcohol use - no    Current Outpatient Prescriptions on File Prior to Visit  Medication Sig Dispense Refill  . carvedilol (COREG) 6.25 MG tablet Take 1 tablet (6.25 mg total) by mouth  2 (two) times daily with a meal.  180 tablet  1  . meclizine (ANTIVERT) 25 MG tablet Take 1 tablet (25 mg total) by mouth 3 (three) times daily as needed for dizziness or nausea.  45 tablet  1  . potassium chloride SA (K-DUR,KLOR-CON) 20 MEQ tablet Take 1 tablet (20 mEq total) by mouth 2 (two) times daily.  6 tablet  0  . promethazine (PHENERGAN) 12.5 MG tablet Take 1 tablet (12.5 mg total) by mouth every 6 (six) hours as needed for nausea.  30 tablet  0   No current facility-administered medications on file prior to visit.    Allergies  Allergen Reactions  . Contrast Media (Iodinated Diagnostic Agents)     N&V  . Lisinopril     REACTION: cough  . Naproxen Sodium     REACTION: Breathing difficulty  . Penicillins     REACTION: Welps  . Simvastatin     REACTION: muscle aches    Review of Systems  Review of Systems  Constitutional:  Negative for fever and malaise/fatigue.  HENT: Negative for congestion.   Eyes: Negative for pain and discharge.  Respiratory: Negative for shortness of breath.   Cardiovascular: Negative for chest pain, palpitations and leg swelling.  Gastrointestinal: Negative for nausea, abdominal pain and diarrhea.  Genitourinary: Negative for dysuria.  Musculoskeletal: Negative for falls.  Skin: Negative for rash.  Neurological: Positive for dizziness. Negative for loss of consciousness and headaches.  Endo/Heme/Allergies: Negative for polydipsia.  Psychiatric/Behavioral: Negative for depression and suicidal ideas. The patient is not nervous/anxious and does not have insomnia.     Objective  BP 130/70  Pulse 75  Temp(Src) 98 F (36.7 C) (Oral)  Ht 5' 5.5" (1.664 m)  Wt 210 lb (95.255 kg)  BMI 34.4 kg/m2  SpO2 97%  Physical Exam  Physical Exam  Constitutional: She is oriented to person, place, and time and well-developed, well-nourished, and in no distress. No distress.  HENT:  Head: Normocephalic and atraumatic.  Eyes: Conjunctivae are normal.  Neck: Neck supple. No thyromegaly present.  Cardiovascular: Normal rate and regular rhythm.  Exam reveals no gallop.   No murmur heard. Pulmonary/Chest: Effort normal and breath sounds normal. She has no wheezes.  Abdominal: She exhibits no distension and no mass.  Musculoskeletal: She exhibits no edema.  Lymphadenopathy:    She has no cervical adenopathy.  Neurological: She is alert and oriented to person, place, and time.  Skin: Skin is warm and dry. No rash noted. She is not diaphoretic.  Psychiatric: Memory, affect and judgment normal.    Lab Results  Component Value Date   TSH 1.009 04/18/2012   Lab Results  Component Value Date   WBC 6.2 07/13/2012   HGB 11.1* 07/13/2012   HCT 33.7* 07/13/2012   MCV 86.2 07/13/2012   PLT 276 07/13/2012   Lab Results  Component Value Date   CREATININE 0.77 07/13/2012   BUN 9 07/13/2012   NA 141  07/13/2012   K 4.2 07/13/2012   CL 107 07/13/2012   CO2 24 07/13/2012   Lab Results  Component Value Date   ALT 15 06/26/2012   AST 18 06/26/2012   ALKPHOS 74 06/26/2012   BILITOT 0.2* 06/26/2012   Lab Results  Component Value Date   CHOL 261* 04/18/2012   Lab Results  Component Value Date   HDL 46 04/18/2012   Lab Results  Component Value Date   LDLCALC 190* 04/18/2012   Lab Results  Component Value  Date   TRIG 123 04/18/2012   Lab Results  Component Value Date   CHOLHDL 5.7 04/18/2012     Assessment & Plan  HYPERTENSION Well controlled no changes today  DIABETES MELLITUS, TYPE II, BORDERLINE Sugar 67 today, minimize simple carbs but no skipping meals  Vertigo, benign positional Improved since hospital visit. May continue Meclizine prn  UTI (lower urinary tract infection) Improved since treatment, encouraged probiotics and increased hydration. UA unremarkable today  Anemia Mild, encourage increased leafy greens and red meat

## 2012-07-16 NOTE — Assessment & Plan Note (Signed)
Mild, encourage increased leafy greens and red meat

## 2012-07-16 NOTE — Assessment & Plan Note (Signed)
Sugar 67 today, minimize simple carbs but no skipping meals

## 2012-07-16 NOTE — Assessment & Plan Note (Signed)
Improved since treatment, encouraged probiotics and increased hydration. UA unremarkable today

## 2012-07-17 ENCOUNTER — Telehealth: Payer: Self-pay | Admitting: *Deleted

## 2012-07-17 ENCOUNTER — Ambulatory Visit: Payer: Medicare Other | Admitting: Family Medicine

## 2012-07-17 DIAGNOSIS — R252 Cramp and spasm: Secondary | ICD-10-CM

## 2012-07-17 DIAGNOSIS — I10 Essential (primary) hypertension: Secondary | ICD-10-CM

## 2012-07-17 DIAGNOSIS — E876 Hypokalemia: Secondary | ICD-10-CM

## 2012-07-17 NOTE — Telephone Encounter (Signed)
Message copied by Regis Bill on Mon Jul 17, 2012 12:26 PM ------      Message from: Danise Edge A      Created: Fri Jul 14, 2012  9:25 AM       So far looks good, potassium now normal can stop potassium supplements and we will repeat renal in 1 month. Anemia stabel and UA neg ------

## 2012-07-17 NOTE — Telephone Encounter (Signed)
Patient informed, understood & agreed; lab order placed/SLS  

## 2012-09-11 ENCOUNTER — Ambulatory Visit: Payer: Medicare Other | Admitting: Family Medicine

## 2012-09-25 ENCOUNTER — Telehealth: Payer: Self-pay | Admitting: Family Medicine

## 2012-09-25 ENCOUNTER — Ambulatory Visit (INDEPENDENT_AMBULATORY_CARE_PROVIDER_SITE_OTHER): Payer: Medicare Other | Admitting: Family Medicine

## 2012-09-25 ENCOUNTER — Encounter: Payer: Self-pay | Admitting: Family Medicine

## 2012-09-25 VITALS — BP 140/78 | HR 84 | Temp 97.7°F | Ht 65.5 in | Wt 211.0 lb

## 2012-09-25 DIAGNOSIS — E785 Hyperlipidemia, unspecified: Secondary | ICD-10-CM

## 2012-09-25 DIAGNOSIS — R7309 Other abnormal glucose: Secondary | ICD-10-CM

## 2012-09-25 DIAGNOSIS — I428 Other cardiomyopathies: Secondary | ICD-10-CM

## 2012-09-25 DIAGNOSIS — I429 Cardiomyopathy, unspecified: Secondary | ICD-10-CM

## 2012-09-25 DIAGNOSIS — I10 Essential (primary) hypertension: Secondary | ICD-10-CM

## 2012-09-25 DIAGNOSIS — D649 Anemia, unspecified: Secondary | ICD-10-CM

## 2012-09-25 DIAGNOSIS — H8110 Benign paroxysmal vertigo, unspecified ear: Secondary | ICD-10-CM

## 2012-09-25 DIAGNOSIS — N390 Urinary tract infection, site not specified: Secondary | ICD-10-CM

## 2012-09-25 LAB — CBC
MCH: 27.8 pg (ref 26.0–34.0)
Platelets: 265 10*3/uL (ref 150–400)
RBC: 4.17 MIL/uL (ref 3.87–5.11)
WBC: 7.2 10*3/uL (ref 4.0–10.5)

## 2012-09-25 NOTE — Assessment & Plan Note (Signed)
Well controlled, no changes 

## 2012-09-25 NOTE — Telephone Encounter (Signed)
Lab order week of 11-27-2012 : Labs prior to visit. Lipid, renal, cbc, tsh, hepatic

## 2012-09-25 NOTE — Assessment & Plan Note (Signed)
stilll using some antivert, episodes usually occur upon arising too quickly encouraged better hydration and split Coreg doses.

## 2012-09-25 NOTE — Assessment & Plan Note (Signed)
Is struggling with increase fatigue referred back to cardiology for further consideration

## 2012-09-25 NOTE — Patient Instructions (Signed)

## 2012-09-25 NOTE — Progress Notes (Signed)
Patient ID: Brenda Marsh, female   DOB: [birth year], 67 y.o.   MRN: [id number] Brenda Marsh [id number] [birth year] 09/25/2012      Progress Note-Follow Up  Subjective  Chief Complaint  Chief Complaint  Patient presents with  . Follow-up    HPI  Patient is a 67 year old African American female in today for followup. She's had no further hospitalizations but she still struggles with some lightheaded woozy feeling when she stands up quickly. No other neurologic complaints. No headaches. No vision or hearing changes. No chest pain, palpitations, shortness of breath, GI or GU complaints. She otherwise complains of worsening fatigue  Past Medical History  Diagnosis Date  . Nonischemic cardiomyopathy     improved by most recent echocardiogram  . LBBB (left bundle branch block)   . Cancer Jun 06, 2007  . Colon polyp     unclear pathology  . Hyperlipidemia   . Hypertension   . Arthritis     left knee pain, MRI in 2008-tricompartmental  degenerate changes, mucoid degeneration of ACL, post horn meniscal tear and ant horn meniscal tear  . Low back pain     left L3-4 injected  . Unspecified constipation 04/22/2012  . Anemia 07/16/2012    Past Surgical History  Procedure Laterality Date  . Appendectomy  Jun 06, 2007    Dr. Ezzard Standing  . Carpal tunnel release  Jun 05, 2005    Dr. Teressa Senter  . Abdominal hysterectomy    . Breast surgery      lumpectomy  . Tonsillectomy    . Knee arthroscopy      Right    Family History  Problem Relation Age of Onset  . Heart attack Mother 68  . Diabetes Sister   . Diabetes Brother   . Coronary artery disease Other     Female first degree relative <60  . Hyperlipidemia Other   . Hypertension Other   . Cancer Other     prostate, 1st degree relative <50    History   Social History  . Marital Status: Widowed    Spouse Name: N/A    Number of Children: 2  . Years of Education: N/A   Occupational History  .     Social History Main Topics  . Smoking status:  Former Smoker    Quit date: 01/18/1970  . Smokeless tobacco: Never Used  . Alcohol Use: No  . Drug Use: Not on file  . Sexual Activity: Not on file   Other Topics Concern  . Not on file   Social History Narrative   Widow - husband died in Jun 06, 2003   Occupation - retired from working in Museum/gallery curator   2 daughter - on lives in [place], other in [place]   Remote history of tobacco but none in 40 years   Alcohol use - no    Current Outpatient Prescriptions on File Prior to Visit  Medication Sig Dispense Refill  . carvedilol (COREG) 6.25 MG tablet Take 1 tablet (6.25 mg total) by mouth 2 (two) times daily with a meal.  180 tablet  1  . meclizine (ANTIVERT) 25 MG tablet Take 1 tablet (25 mg total) by mouth 3 (three) times daily as needed for dizziness or nausea.  45 tablet  1  . potassium chloride SA (K-DUR,KLOR-CON) 20 MEQ tablet Take 1 tablet (20 mEq total) by mouth 2 (two) times daily.  6 tablet  0  . promethazine (PHENERGAN) 12.5 MG tablet Take 1 tablet (12.5 mg total) by mouth every 6 (six) hours  as needed for nausea.  30 tablet  0   No current facility-administered medications on file prior to visit.    Allergies  Allergen Reactions  . Contrast Media [Iodinated Diagnostic Agents]     N&V  . Lisinopril     REACTION: cough  . Naproxen Sodium     REACTION: Breathing difficulty  . Penicillins     REACTION: Welps  . Simvastatin     REACTION: muscle aches    Review of Systems  Review of Systems  Constitutional: Positive for malaise/fatigue. Negative for fever.  HENT: Negative for congestion.   Eyes: Negative for discharge.  Respiratory: Negative for shortness of breath.   Cardiovascular: Negative for chest pain, palpitations and leg swelling.  Gastrointestinal: Negative for nausea, abdominal pain and diarrhea.  Genitourinary: Negative for dysuria.  Musculoskeletal: Negative for falls.  Skin: Negative for rash.  Neurological: Positive for dizziness. Negative for loss of  consciousness and headaches.  Endo/Heme/Allergies: Negative for polydipsia.  Psychiatric/Behavioral: Negative for depression and suicidal ideas. The patient is not nervous/anxious and does not have insomnia.     Objective  BP 140/78  Pulse 84  Temp(Src) 97.7 F (36.5 C) (Oral)  Ht 5' 5.5" (1.664 m)  Wt 211 lb (95.709 kg)  BMI 34.57 kg/m2  SpO2 97%  Physical Exam  Physical Exam  Constitutional: She is oriented to person, place, and time and well-developed, well-nourished, and in no distress. No distress.  HENT:  Head: Normocephalic and atraumatic.  Eyes: Conjunctivae are normal.  Neck: Neck supple. No thyromegaly present.  Cardiovascular: Normal rate, regular rhythm and normal heart sounds.   No murmur heard. Pulmonary/Chest: Effort normal and breath sounds normal. She has no wheezes.  Abdominal: She exhibits no distension and no mass.  Musculoskeletal: She exhibits no edema.  Lymphadenopathy:    She has no cervical adenopathy.  Neurological: She is alert and oriented to person, place, and time.  Skin: Skin is warm and dry. No rash noted. She is not diaphoretic.  Psychiatric: Memory, affect and judgment normal.    Lab Results  Component Value Date   TSH 1.009 04/18/2012   Lab Results  Component Value Date   WBC 7.2 09/25/2012   HGB 11.6* 09/25/2012   HCT 35.2* 09/25/2012   MCV 84.4 09/25/2012   PLT 265 09/25/2012   Lab Results  Component Value Date   CREATININE 0.77 07/13/2012   BUN 9 07/13/2012   NA 141 07/13/2012   K 4.2 07/13/2012   CL 107 07/13/2012   CO2 24 07/13/2012   Lab Results  Component Value Date   ALT 15 06/26/2012   AST 18 06/26/2012   ALKPHOS 74 06/26/2012   BILITOT 0.2* 06/26/2012   Lab Results  Component Value Date   CHOL 261* 04/18/2012   Lab Results  Component Value Date   HDL 46 04/18/2012   Lab Results  Component Value Date   LDLCALC 190* 04/18/2012   Lab Results  Component Value Date   TRIG 123 04/18/2012   Lab Results  Component Value Date    CHOLHDL 5.7 04/18/2012     Assessment & Plan  HYPERTENSION Well controlled, no changes  Vertigo, benign positional stilll using some antivert, episodes usually occur upon arising too quickly encouraged better hydration and split Coreg doses.  DIABETES MELLITUS, TYPE II, BORDERLINE Minimizes simple carbs increase exercise  CARDIOMYOPATHY Is struggling with increase fatigue referred back to cardiology for further consideration

## 2012-09-25 NOTE — Assessment & Plan Note (Signed)
Minimizes simple carbs increase exercise

## 2012-09-26 ENCOUNTER — Telehealth: Payer: Self-pay | Admitting: *Deleted

## 2012-09-26 LAB — URINALYSIS
Bilirubin Urine: NEGATIVE
Glucose, UA: NEGATIVE mg/dL
Ketones, ur: NEGATIVE mg/dL
Specific Gravity, Urine: 1.017 (ref 1.005–1.030)
Urobilinogen, UA: 0.2 mg/dL (ref 0.0–1.0)
pH: 6 (ref 5.0–8.0)

## 2012-09-26 LAB — RENAL FUNCTION PANEL
Albumin: 3.8 g/dL (ref 3.5–5.2)
CO2: 25 mEq/L (ref 19–32)
Potassium: 4.4 mEq/L (ref 3.5–5.3)
Sodium: 139 mEq/L (ref 135–145)

## 2012-09-26 LAB — HEPATIC FUNCTION PANEL
Albumin: 3.8 g/dL (ref 3.5–5.2)
Total Bilirubin: 0.2 mg/dL — ABNORMAL LOW (ref 0.3–1.2)
Total Protein: 7.4 g/dL (ref 6.0–8.3)

## 2012-09-26 MED ORDER — NITROFURANTOIN MONOHYD MACRO 100 MG PO CAPS: 100.0000 mg | ORAL_CAPSULE | Freq: Two times a day (BID) | ORAL | Status: AC

## 2012-09-26 NOTE — Telephone Encounter (Signed)
Message copied by Marlene Lard on Tue Sep 26, 2012  2:38 PM ------      Message from: Danise Edge A      Created: Tue Sep 26, 2012  9:12 AM       Notify labs unremarkable except urine looks suspicious. Would start Macrobid 100 mg po bid x 5 day while awaiting culture ------

## 2012-09-28 LAB — URINE CULTURE: Colony Count: >=100000

## 2012-09-29 ENCOUNTER — Telehealth: Payer: Self-pay | Admitting: *Deleted

## 2012-09-29 MED ORDER — SULFAMETHOXAZOLE-TRIMETHOPRIM 800-160 MG PO TABS: 1.0000 | ORAL_TABLET | Freq: Two times a day (BID) | ORAL | Status: AC

## 2012-09-29 NOTE — Telephone Encounter (Signed)
Message copied by Marlene Lard on Fri Sep 29, 2012 11:36 AM ------      Message from: Danise Edge A      Created: Thu Sep 28, 2012  2:14 PM       Notify only partially responsive to Macrobid would rec we switch to Bactrim DS 1 tab po bid x 5 days. ------

## 2012-11-06 ENCOUNTER — Encounter: Payer: Self-pay | Admitting: Cardiology

## 2012-11-06 ENCOUNTER — Ambulatory Visit (INDEPENDENT_AMBULATORY_CARE_PROVIDER_SITE_OTHER): Payer: Medicare Other | Admitting: Cardiology

## 2012-11-06 VITALS — BP 122/74 | HR 92 | Ht 65.5 in | Wt 210.4 lb

## 2012-11-06 DIAGNOSIS — R0989 Other specified symptoms and signs involving the circulatory and respiratory systems: Secondary | ICD-10-CM

## 2012-11-06 DIAGNOSIS — I10 Essential (primary) hypertension: Secondary | ICD-10-CM

## 2012-11-06 DIAGNOSIS — I428 Other cardiomyopathies: Secondary | ICD-10-CM

## 2012-11-06 NOTE — Assessment & Plan Note (Signed)
Patient has a history of nonischemic cardiomyopathy improved on most recent echocardiogram. Question related to previous chemotherapy from breast cancer. Previous catheterization showed no obstructive coronary disease. She does have some fatigue. Plan to repeat echocardiogram to assess LV function. She has had problems with low blood pressure by her report. She is taking carvedilol 3.125 mg by mouth twice a day and her ARB was discontinued. Previous cough with ACE inhibitors. If LV function is reduced we will resume ARB and increase medications as needed and tolerated.

## 2012-11-06 NOTE — Assessment & Plan Note (Signed)
Schedule Dopplers to further assess.

## 2012-11-06 NOTE — Patient Instructions (Signed)
Your physician recommends that you schedule a follow-up appointment in: 6 to 8 WEEKS WITH DR Jens Som  Your physician has requested that you have a carotid duplex. This test is an ultrasound of the carotid arteries in your neck. It looks at blood flow through these arteries that supply the brain with blood. Allow one hour for this exam. There are no restrictions or special instructions.   Your physician has requested that you have an echocardiogram. Echocardiography is a painless test that uses sound waves to create images of your heart. It provides your doctor with information about the size and shape of your heart and how well your heart's chambers and valves are working. This procedure takes approximately one hour. There are no restrictions for this procedure.

## 2012-11-06 NOTE — Assessment & Plan Note (Signed)
Blood pressure has been mildly decreased. I have asked her to track this at home and we will adjust regimen based on results.

## 2012-11-06 NOTE — Progress Notes (Signed)
HPI: Pleasant female with history of dilated cardiomyopathy for evaluation. Patient has a history of left bundle branch block. She underwent cardiac catheterization in [facility] in March of 2010 for reduced LV function. Ejection fraction was 35%. The left main was normal. The LAD had a proximal 25-30% lesion. The circumflex was normal. The right coronary artery had a 15% lesion. Patient was treated with medications. Her most recent echocardiogram in March of 2011 revealed improvement in her LV function. Ejection fraction was 50%.  She also has diastolic dysfunction. She also has a history of chest pain. I have not seen her since June of 2011. She does have fatigue for approximately 8 months. She has dyspnea with more extreme activities but not routine activities. No orthopnea, PND or pedal edema. No chest pain or syncope.  Current Outpatient Prescriptions  Medication Sig Dispense Refill  . Alpha-Lipoic Acid 200 MG CAPS Take by mouth.      Marland Kitchen aspirin 81 MG tablet Take 81 mg by mouth daily.      . carvedilol (COREG) 6.25 MG tablet Take 6.25 mg by mouth daily.      . cholecalciferol (VITAMIN D) 1000 UNITS tablet Take 1,000 Units by mouth daily.      . meclizine (ANTIVERT) 25 MG tablet Take 1 tablet (25 mg total) by mouth 3 (three) times daily as needed for dizziness or nausea.  45 tablet  1  . potassium chloride SA (K-DUR,KLOR-CON) 20 MEQ tablet Take 20 mEq by mouth once a week. 1/2 tablet once a week for a total of 10 meq.      . promethazine (PHENERGAN) 12.5 MG tablet Take 1 tablet (12.5 mg total) by mouth every 6 (six) hours as needed for nausea.  30 tablet  0   No current facility-administered medications for this visit.    Allergies  Allergen Reactions  . Contrast Media [Iodinated Diagnostic Agents]     N&V  . Lisinopril     REACTION: cough  . Naproxen Sodium     REACTION: Breathing difficulty  . Penicillins     REACTION: Welps  . Simvastatin     REACTION: muscle aches    Past  Medical History  Diagnosis Date  . Nonischemic cardiomyopathy     improved by most recent echocardiogram  . LBBB (left bundle branch block)   . Cancer 1999    Breast; lumpectomy, radiation and chemotherapy  . Colon polyp     unclear pathology  . Hyperlipidemia   . Hypertension   . Arthritis     left knee pain, MRI in 2008-tricompartmental  degenerate changes, mucoid degeneration of ACL, post horn meniscal tear and ant horn meniscal tear  . Low back pain     left L3-4 injected  . Unspecified constipation 04/22/2012  . Anemia 07/16/2012    Past Surgical History  Procedure Laterality Date  . Appendectomy  2009    Dr. Ezzard Standing  . Carpal tunnel release  2007    Dr. Teressa Senter  . Abdominal hysterectomy    . Breast surgery      lumpectomy  . Tonsillectomy    . Knee arthroscopy      Right    History   Social History  . Marital Status: Widowed    Spouse Name: N/A    Number of Children: 2  . Years of Education: N/A   Occupational History  .     Social History Main Topics  . Smoking status: Former Smoker  Quit date: 01/18/1970  . Smokeless tobacco: Never Used  . Alcohol Use: Yes     Comment: Rare  . Drug Use: Not on file  . Sexual Activity: Not on file   Other Topics Concern  . Not on file   Social History Narrative   Widow - husband died in Jun 27, 2003   Occupation - retired from working in Museum/gallery curator   2 daughter - on lives in [place], other in [place]   Remote history of tobacco but none in 40 years   Alcohol use - no    Family History  Problem Relation Age of Onset  . Heart attack Mother 61  . Diabetes Sister   . Diabetes Brother   . Coronary artery disease Other     Female first degree relative <60  . Hyperlipidemia Other   . Hypertension Other   . Cancer Other     prostate, 1st degree relative <50    ROS: no fevers or chills, productive cough, hemoptysis, dysphasia, odynophagia, melena, hematochezia, dysuria, hematuria, rash, seizure activity, orthopnea,  PND, pedal edema, claudication. Remaining systems are negative.  Physical Exam:   Blood pressure 122/74, pulse 92, height 5' 5.5" (1.664 m), weight 210 lb 6.4 oz (95.437 kg).  General:  Well developed/well nourished in NAD Skin warm/dry Patient not depressed No peripheral clubbing Back-normal HEENT-normal/normal eyelids Neck supple/normal carotid upstroke bilaterally; left carotid bruit; no JVD; no thyromegaly chest - CTA/ normal expansion CV - RRR/normal S1 and S2; no murmurs, rubs or gallops;  PMI nondisplaced Abdomen -NT/ND, no HSM, no mass, + bowel sounds, no bruit 2+ femoral pulses, no bruits Ext-no edema, chords, 2+ DP Neuro-grossly nonfocal  ECG sinus rhythm at a rate of 80. First degree AV block. Left bundle branch block.

## 2012-11-09 ENCOUNTER — Encounter: Payer: Self-pay | Admitting: Physician Assistant

## 2012-11-09 ENCOUNTER — Ambulatory Visit (INDEPENDENT_AMBULATORY_CARE_PROVIDER_SITE_OTHER): Payer: Medicare Other | Admitting: Physician Assistant

## 2012-11-09 VITALS — BP 148/78 | HR 94 | Temp 97.9°F | Resp 18 | Ht 65.5 in | Wt 212.5 lb

## 2012-11-09 DIAGNOSIS — M545 Low back pain, unspecified: Secondary | ICD-10-CM

## 2012-11-09 DIAGNOSIS — M7918 Myalgia, other site: Secondary | ICD-10-CM | POA: Insufficient documentation

## 2012-11-09 NOTE — Progress Notes (Signed)
Patient ID: Brenda Marsh, female   DOB: 18-May-1945, 67 y.o.   MRN: [id number]  Patient presents to clinic today c/o right-sided low back pain x 3 days after she states she "pulled a muscle" while getting up from the exam table at her Cardiologist's office.  Pain has been present since then.  Described as throbbing.  Affects low back and muscled of RLE.  Worse with lying in certain positions and with motion.  Has tried ibuprofen with minimal relief of symptoms.  Patient denies trauma.  Has history of spinal stenosis that she states is asymptomatic.  Patient states she cannot take Aleve or stronger pain medicines due to nausea and SOB.  States she cannot take muscle relaxers because they make her short of breath.     Past Medical History  Diagnosis Date  . Nonischemic cardiomyopathy     improved by most recent echocardiogram  . LBBB (left bundle branch block)   . Cancer 1999    Breast; lumpectomy, radiation and chemotherapy  . Colon polyp     unclear pathology  . Hyperlipidemia   . Hypertension   . Arthritis     left knee pain, MRI in 2008-tricompartmental  degenerate changes, mucoid degeneration of ACL, post horn meniscal tear and ant horn meniscal tear  . Low back pain     left L3-4 injected  . Unspecified constipation 04/22/2012  . Anemia 07/16/2012    Current Outpatient Prescriptions on File Prior to Visit  Medication Sig Dispense Refill  . Alpha-Lipoic Acid 200 MG CAPS Take by mouth.      Marland Kitchen aspirin 81 MG tablet Take 81 mg by mouth daily.      . carvedilol (COREG) 6.25 MG tablet Take 6.25 mg by mouth daily.      . cholecalciferol (VITAMIN D) 1000 UNITS tablet Take 1,000 Units by mouth daily.      . meclizine (ANTIVERT) 25 MG tablet Take 1 tablet (25 mg total) by mouth 3 (three) times daily as needed for dizziness or nausea.  45 tablet  1  . potassium chloride SA (K-DUR,KLOR-CON) 20 MEQ tablet Take 20 mEq by mouth once a week. 1/2 tablet once a week for a total of 10 meq.      .  promethazine (PHENERGAN) 12.5 MG tablet Take 1 tablet (12.5 mg total) by mouth every 6 (six) hours as needed for nausea.  30 tablet  0   No current facility-administered medications on file prior to visit.    Allergies  Allergen Reactions  . Contrast Media [Iodinated Diagnostic Agents]     N&V  . Lisinopril     REACTION: cough  . Naproxen Sodium     REACTION: Breathing difficulty  . Penicillins     REACTION: Welps  . Simvastatin     REACTION: muscle aches    Family History  Problem Relation Age of Onset  . Heart attack Mother 44  . Diabetes Sister   . Diabetes Brother   . Coronary artery disease Other     Female first degree relative <60  . Hyperlipidemia Other   . Hypertension Other   . Cancer Other     prostate, 1st degree relative <50    History   Social History  . Marital Status: Widowed    Spouse Name: N/A    Number of Children: 2  . Years of Education: N/A   Occupational History  .     Social History Main Topics  . Smoking status: Former Smoker  Quit date: 01/18/1970  . Smokeless tobacco: Never Used  . Alcohol Use: Yes     Comment: Rare  . Drug Use: None  . Sexual Activity: None   Other Topics Concern  . None   Social History Narrative   Widow - husband died in 25-Jun-2003   Occupation - retired from working in Museum/gallery curator   2 daughter - on lives in [place], other in [place]   Remote history of tobacco but none in 40 years   Alcohol use - no   ROS See HPI.  All other ROS are negative.   Filed Vitals:   11/09/12 0846  BP: 148/78  Pulse: 94  Temp: 97.9 F (36.6 C)  Resp: 18   Physical Exam  Vitals reviewed. Constitutional: She is oriented to person, place, and time.  Overweight, well-developed, in no acute distress  HENT:  Head: Normocephalic and atraumatic.  Eyes: Conjunctivae are normal.  Neck: Neck supple.  Cardiovascular: Normal rate, regular rhythm and normal heart sounds.   Pulmonary/Chest: Effort normal and breath sounds  normal.  Musculoskeletal:  No bony tenderness or abnormality noted.  Pain with palpation of right lumbar perispinal musculature noted on exam.  Mild spasm noted.  No pain with palpation or ROM of RLE  Neurological: She is alert and oriented to person, place, and time. No cranial nerve deficit. Gait normal.  Skin: Skin is warm and dry. No rash noted.   Recent Results (from the past 06/25/58 hour(s))  URINE CULTURE     Status: None   Collection Time    09/25/12  3:09 PM      Result Value Range   Culture KLEBSIELLA OXYTOCA     Colony Count >=100,000 COLONIES/ML     Organism ID, Bacteria KLEBSIELLA OXYTOCA    URINALYSIS     Status: Abnormal   Collection Time    09/25/12  3:23 PM      Result Value Range   Color, Urine YELLOW  YELLOW   APPearance CLEAR  CLEAR   Specific Gravity, Urine 1.017  1.005 - 1.030   pH 6.0  5.0 - 8.0   Glucose, UA NEG  NEG mg/dL   Bilirubin Urine NEG  NEG   Ketones, ur NEG  NEG mg/dL   Hgb urine dipstick NEG  NEG   Protein, ur NEG  NEG mg/dL   Urobilinogen, UA 0.2  0.0 - 1.0 mg/dL   Nitrite POS (*) NEG   Leukocytes, UA TRACE (*) NEG  CBC     Status: Abnormal   Collection Time    09/25/12  3:23 PM      Result Value Range   WBC 7.2  4.0 - 10.5 K/uL   RBC 4.17  3.87 - 5.11 MIL/uL   Hemoglobin 11.6 (*) 12.0 - 15.0 g/dL   HCT 14.7 (*) 82.9 - 56.2 %   MCV 84.4  78.0 - 100.0 fL   MCH 27.8  26.0 - 34.0 pg   MCHC 33.0  30.0 - 36.0 g/dL   RDW 13.0 (*) 86.5 - 78.4 %   Platelets 265  150 - 400 K/uL  RENAL FUNCTION PANEL     Status: None   Collection Time    09/25/12  3:23 PM      Result Value Range   Sodium 139  135 - 145 mEq/L   Potassium 4.4  3.5 - 5.3 mEq/L   Chloride 106  96 - 112 mEq/L   CO2 25  19 - 32 mEq/L  Glucose, Bld 83  70 - 99 mg/dL   BUN 10  6 - 23 mg/dL   Creat 4.40  1.02 - 7.25 mg/dL   Albumin 3.8  3.5 - 5.2 g/dL   Calcium 9.3  8.4 - 36.6 mg/dL   Phosphorus 3.4  2.3 - 4.6 mg/dL  HEPATIC FUNCTION PANEL     Status: Abnormal   Collection Time     09/25/12  3:23 PM      Result Value Range   Total Bilirubin 0.2 (*) 0.3 - 1.2 mg/dL   Bilirubin, Direct 0.1  0.0 - 0.3 mg/dL   Indirect Bilirubin 0.1  0.0 - 0.9 mg/dL   Alkaline Phosphatase 66  39 - 117 U/L   AST 20  0 - 37 U/L   ALT 13  0 - 35 U/L   Total Protein 7.4  6.0 - 8.3 g/dL   Albumin 3.8  3.5 - 5.2 g/dL   Assessment/Plan: Muscle pain, lumbar Rest.  Alternate Ice and moist heat.  Topical Aspercreme.  Patient to see her Chiropractor.  Alternate Ibuprofen and Tylenol.  Patient declines stronger medicine or muscle relaxer.  Follow-up in 1 week if symptoms persist.

## 2012-11-09 NOTE — Assessment & Plan Note (Signed)
Rest.  Alternate Ice and moist heat.  Topical Aspercreme.  Patient to see her Chiropractor.  Alternate Ibuprofen and Tylenol.  Patient declines stronger medicine or muscle relaxer.  Follow-up in 1 week if symptoms persist.

## 2012-11-09 NOTE — Patient Instructions (Signed)
Please continue with ibuprofen and tylenol for pain.  Try topical salon pas.  Attempt to alternate moist heat and cold compresses.  It is ok for you to see your chiropractor.  Please call or return to clinic if symptoms are not improving over the next week.

## 2012-11-15 ENCOUNTER — Institutional Professional Consult (permissible substitution): Payer: Medicare Other | Admitting: Cardiology

## 2012-11-16 ENCOUNTER — Telehealth: Payer: Self-pay | Admitting: Family Medicine

## 2012-11-16 ENCOUNTER — Other Ambulatory Visit: Payer: Self-pay | Admitting: Family Medicine

## 2012-11-16 DIAGNOSIS — M549 Dorsalgia, unspecified: Secondary | ICD-10-CM

## 2012-11-16 NOTE — Telephone Encounter (Signed)
Please advise referral?  

## 2012-11-16 NOTE — Telephone Encounter (Signed)
I will put in referral for physical therapy for back pain which is what I have in my note. Please  Make sure they get it before her appt.

## 2012-11-16 NOTE — Telephone Encounter (Signed)
Darl Pikes from Rehab called stating that patient had called her office wanting to schedule an appointment for a pulled muscle. She says that patient had told her that Dr. Abner Greenspan would write her a referral for this. Patient is scheduled for 11/20/12 for rehab. Please enter referral. Thanks.

## 2012-11-17 ENCOUNTER — Ambulatory Visit (HOSPITAL_COMMUNITY): Payer: Medicare Other | Attending: Internal Medicine

## 2012-11-17 DIAGNOSIS — E785 Hyperlipidemia, unspecified: Secondary | ICD-10-CM | POA: Insufficient documentation

## 2012-11-17 DIAGNOSIS — I10 Essential (primary) hypertension: Secondary | ICD-10-CM | POA: Insufficient documentation

## 2012-11-17 DIAGNOSIS — R0989 Other specified symptoms and signs involving the circulatory and respiratory systems: Secondary | ICD-10-CM | POA: Insufficient documentation

## 2012-11-17 DIAGNOSIS — I6529 Occlusion and stenosis of unspecified carotid artery: Secondary | ICD-10-CM | POA: Insufficient documentation

## 2012-11-17 DIAGNOSIS — I658 Occlusion and stenosis of other precerebral arteries: Secondary | ICD-10-CM | POA: Insufficient documentation

## 2012-11-17 DIAGNOSIS — E119 Type 2 diabetes mellitus without complications: Secondary | ICD-10-CM | POA: Insufficient documentation

## 2012-11-17 NOTE — Telephone Encounter (Signed)
Spoke to [place] and she states that she has done her part and physical therapy will call and arrange with pt

## 2012-11-20 ENCOUNTER — Ambulatory Visit: Payer: Medicare Other | Attending: Family Medicine | Admitting: Physical Therapy

## 2012-11-20 DIAGNOSIS — M25559 Pain in unspecified hip: Secondary | ICD-10-CM | POA: Insufficient documentation

## 2012-11-20 DIAGNOSIS — M545 Low back pain, unspecified: Secondary | ICD-10-CM | POA: Insufficient documentation

## 2012-11-20 DIAGNOSIS — M6281 Muscle weakness (generalized): Secondary | ICD-10-CM | POA: Insufficient documentation

## 2012-11-20 DIAGNOSIS — IMO0001 Reserved for inherently not codable concepts without codable children: Secondary | ICD-10-CM | POA: Insufficient documentation

## 2012-11-23 ENCOUNTER — Ambulatory Visit: Payer: Medicare Other | Admitting: Physical Therapy

## 2012-11-24 ENCOUNTER — Ambulatory Visit (HOSPITAL_COMMUNITY): Payer: Medicare Other | Attending: Cardiology | Admitting: Cardiology

## 2012-11-24 DIAGNOSIS — I359 Nonrheumatic aortic valve disorder, unspecified: Secondary | ICD-10-CM | POA: Insufficient documentation

## 2012-11-24 DIAGNOSIS — Z6834 Body mass index (BMI) 34.0-34.9, adult: Secondary | ICD-10-CM | POA: Insufficient documentation

## 2012-11-24 DIAGNOSIS — E785 Hyperlipidemia, unspecified: Secondary | ICD-10-CM | POA: Insufficient documentation

## 2012-11-24 DIAGNOSIS — I447 Left bundle-branch block, unspecified: Secondary | ICD-10-CM | POA: Insufficient documentation

## 2012-11-24 DIAGNOSIS — I059 Rheumatic mitral valve disease, unspecified: Secondary | ICD-10-CM | POA: Insufficient documentation

## 2012-11-24 DIAGNOSIS — I2789 Other specified pulmonary heart diseases: Secondary | ICD-10-CM | POA: Insufficient documentation

## 2012-11-24 DIAGNOSIS — I10 Essential (primary) hypertension: Secondary | ICD-10-CM | POA: Insufficient documentation

## 2012-11-24 DIAGNOSIS — C50919 Malignant neoplasm of unspecified site of unspecified female breast: Secondary | ICD-10-CM | POA: Insufficient documentation

## 2012-11-24 DIAGNOSIS — I2510 Atherosclerotic heart disease of native coronary artery without angina pectoris: Secondary | ICD-10-CM | POA: Insufficient documentation

## 2012-11-24 DIAGNOSIS — I428 Other cardiomyopathies: Secondary | ICD-10-CM | POA: Insufficient documentation

## 2012-11-24 NOTE — Progress Notes (Signed)
Echo performed. 

## 2012-11-28 ENCOUNTER — Ambulatory Visit: Payer: Medicare Other | Admitting: Rehabilitation

## 2012-11-30 ENCOUNTER — Ambulatory Visit: Payer: Medicare Other | Admitting: Physical Therapy

## 2012-12-01 LAB — RENAL FUNCTION PANEL
Albumin: 4.3 g/dL (ref 3.5–5.2)
BUN: 10 mg/dL (ref 6–23)
CO2: 27 mEq/L (ref 19–32)
Glucose, Bld: 89 mg/dL (ref 70–99)
Sodium: 140 mEq/L (ref 135–145)

## 2012-12-01 LAB — LIPID PANEL
Cholesterol: 289 mg/dL — ABNORMAL HIGH (ref 0–200)
HDL: 45 mg/dL (ref >39)
Total CHOL/HDL Ratio: 6.4 Ratio
Triglycerides: 69 mg/dL (ref <150)

## 2012-12-01 LAB — CBC
MCH: 27.6 pg (ref 26.0–34.0)
MCHC: 33.1 g/dL (ref 30.0–36.0)
MCV: 83.4 fL (ref 78.0–100.0)
Platelets: 306 10*3/uL (ref 150–400)
RBC: 3.91 MIL/uL (ref 3.87–5.11)

## 2012-12-01 LAB — HEPATIC FUNCTION PANEL
ALT: 29 U/L (ref 0–35)
Albumin: 4.3 g/dL (ref 3.5–5.2)
Alkaline Phosphatase: 73 U/L (ref 39–117)
Indirect Bilirubin: 0.3 mg/dL (ref 0.0–0.9)
Total Protein: 7.8 g/dL (ref 6.0–8.3)

## 2012-12-04 ENCOUNTER — Ambulatory Visit: Payer: Medicare Other | Admitting: Family Medicine

## 2012-12-04 ENCOUNTER — Ambulatory Visit: Payer: Medicare Other | Admitting: Physical Therapy

## 2012-12-05 ENCOUNTER — Telehealth: Payer: Self-pay | Admitting: Family Medicine

## 2012-12-05 ENCOUNTER — Other Ambulatory Visit: Payer: Self-pay | Admitting: *Deleted

## 2012-12-05 DIAGNOSIS — I429 Cardiomyopathy, unspecified: Secondary | ICD-10-CM

## 2012-12-05 MED ORDER — LOSARTAN POTASSIUM 25 MG PO TABS: 25.0000 mg | ORAL_TABLET | Freq: Every day | ORAL | Status: DC

## 2012-12-05 NOTE — Telephone Encounter (Signed)
Patient left message on nurse voicemail requesting last lab results.

## 2012-12-05 NOTE — Telephone Encounter (Signed)
Look at lab result notes 

## 2012-12-06 NOTE — Telephone Encounter (Signed)
Left message for patient to return my call.

## 2012-12-08 NOTE — Telephone Encounter (Signed)
Informed patient that Dr. Abner Greenspan wanted her to schedule a follow up appointment. She states that "she is in the shower and will have to call me back to schedule this appointment"

## 2012-12-18 ENCOUNTER — Ambulatory Visit: Payer: Medicare Other | Admitting: Physical Therapy

## 2012-12-18 ENCOUNTER — Ambulatory Visit (INDEPENDENT_AMBULATORY_CARE_PROVIDER_SITE_OTHER): Payer: Medicare Other | Admitting: Family Medicine

## 2012-12-18 ENCOUNTER — Telehealth: Payer: Self-pay | Admitting: Cardiology

## 2012-12-18 ENCOUNTER — Encounter: Payer: Self-pay | Admitting: Family Medicine

## 2012-12-18 VITALS — BP 122/62 | HR 88 | Temp 97.5°F | Ht 65.5 in | Wt 209.0 lb

## 2012-12-18 DIAGNOSIS — M549 Dorsalgia, unspecified: Secondary | ICD-10-CM

## 2012-12-18 DIAGNOSIS — E785 Hyperlipidemia, unspecified: Secondary | ICD-10-CM

## 2012-12-18 DIAGNOSIS — R3911 Hesitancy of micturition: Secondary | ICD-10-CM

## 2012-12-18 DIAGNOSIS — D649 Anemia, unspecified: Secondary | ICD-10-CM

## 2012-12-18 DIAGNOSIS — I10 Essential (primary) hypertension: Secondary | ICD-10-CM

## 2012-12-18 DIAGNOSIS — Z86010 Personal history of colonic polyps: Secondary | ICD-10-CM

## 2012-12-18 MED ORDER — FERROUS FUMARATE-FOLIC ACID 324-1 MG PO TABS: 1.0000 | ORAL_TABLET | Freq: Every day | ORAL | Status: DC

## 2012-12-18 NOTE — Progress Notes (Signed)
Pre visit review using our clinic review tool, if applicable. No additional management support is needed unless otherwise documented below in the visit note. 

## 2012-12-18 NOTE — Telephone Encounter (Signed)
I spoke with the pt and she is seeing Dr Rogelia Rohrer today.  I made her aware that the BMP order is already in the system and she can have this done in that office.

## 2012-12-18 NOTE — Telephone Encounter (Signed)
New problem    Patient is requesting for labs to be drawn by Dr. Myna Hidalgo office verses she comes back to [facility] to have it done.

## 2012-12-18 NOTE — Patient Instructions (Signed)

## 2012-12-20 ENCOUNTER — Telehealth: Payer: Self-pay | Admitting: Cardiology

## 2012-12-20 ENCOUNTER — Other Ambulatory Visit: Payer: Self-pay | Admitting: *Deleted

## 2012-12-20 DIAGNOSIS — I429 Cardiomyopathy, unspecified: Secondary | ICD-10-CM

## 2012-12-20 DIAGNOSIS — I428 Other cardiomyopathies: Secondary | ICD-10-CM

## 2012-12-20 NOTE — Telephone Encounter (Signed)
New message    Want to have lab work drawn at [facility] office on [address] but there is no order in the computer.  Will go back tomorrow to try to get labs. Pt thinks the lab is a [facility]. If there is a problem, pls call pt

## 2012-12-20 NOTE — Assessment & Plan Note (Signed)
Encouraged avoid trans fats and minimize simple carbs and saturated fats. Declines any further attempts at meds. Recommended, Livalo, Welchol and Trico and patient declined. Add krill oil.

## 2012-12-20 NOTE — Assessment & Plan Note (Signed)
With RLE pain, actually improving, no changes today, did some PT and found that somewhat helpful. Encouraged activity as tolerated

## 2012-12-20 NOTE — Progress Notes (Signed)
Patient ID: Brenda Marsh, female   DOB: November 17, 1945, 67 y.o.   MRN: [id number] Brenda Marsh [id number] Nov 11, 1945 12/20/2012      Progress Note-Follow Up  Subjective  Chief Complaint  Chief Complaint  Patient presents with  . Follow-up    HPI  Patient is a 67 year old female who is in today for followup she is doing well. Does note her low back and right hip and thigh pain are slowly improving. Physical therapy was somewhat helpful. No other acute complaints. No chest pain, palpitations, shortness of breath, GI or GU complaints. Notes feeling numerous statins in the past including Crestor, Lipitor and simvastatin secondary to mild myalgias and ineffective. No fevers, chills, or recent illness.  Past Medical History  Diagnosis Date  . Nonischemic cardiomyopathy     improved by most recent echocardiogram  . LBBB (left bundle branch block)   . Cancer 1999    Breast; lumpectomy, radiation and chemotherapy  . Colon polyp     unclear pathology  . Hyperlipidemia   . Hypertension   . Arthritis     left knee pain, MRI in 2008-tricompartmental  degenerate changes, mucoid degeneration of ACL, post horn meniscal tear and ant horn meniscal tear  . Low back pain     left L3-4 injected  . Unspecified constipation 04/22/2012  . Anemia 07/16/2012    Past Surgical History  Procedure Laterality Date  . Appendectomy  2007/05/29    Dr. Ezzard Standing  . Carpal tunnel release  28-May-2005    Dr. Teressa Senter  . Abdominal hysterectomy    . Breast surgery      lumpectomy  . Tonsillectomy    . Knee arthroscopy      Right    Family History  Problem Relation Age of Onset  . Heart attack Mother 54  . Diabetes Sister   . Diabetes Brother   . Coronary artery disease Other     Female first degree relative <60  . Hyperlipidemia Other   . Hypertension Other   . Cancer Other     prostate, 1st degree relative <50    History   Social History  . Marital Status: Widowed    Spouse Name: N/A    Number of  Children: 2  . Years of Education: N/A   Occupational History  .     Social History Main Topics  . Smoking status: Former Smoker    Quit date: 01/18/1970  . Smokeless tobacco: Never Used  . Alcohol Use: Yes     Comment: Rare  . Drug Use: Not on file  . Sexual Activity: Not on file   Other Topics Concern  . Not on file   Social History Narrative   Widow - husband died in 05-29-2003   Occupation - retired from working in Museum/gallery curator   2 daughter - on lives in [place], other in [place]   Remote history of tobacco but none in 40 years   Alcohol use - no    Current Outpatient Prescriptions on File Prior to Visit  Medication Sig Dispense Refill  . Alpha-Lipoic Acid 200 MG CAPS Take by mouth.      Marland Kitchen aspirin 81 MG tablet Take 81 mg by mouth daily.      . carvedilol (COREG) 6.25 MG tablet Take 6.25 mg by mouth daily.      . cholecalciferol (VITAMIN D) 1000 UNITS tablet Take 1,000 Units by mouth daily.      Marland Kitchen losartan (COZAAR) 25 MG tablet Take  1 tablet (25 mg total) by mouth daily.  90 tablet  3  . meclizine (ANTIVERT) 25 MG tablet Take 1 tablet (25 mg total) by mouth 3 (three) times daily as needed for dizziness or nausea.  45 tablet  1  . potassium chloride SA (K-DUR,KLOR-CON) 20 MEQ tablet Take 20 mEq by mouth once a week. 1/2 tablet once a week for a total of 10 meq.      . promethazine (PHENERGAN) 12.5 MG tablet Take 1 tablet (12.5 mg total) by mouth every 6 (six) hours as needed for nausea.  30 tablet  0   No current facility-administered medications on file prior to visit.    Allergies  Allergen Reactions  . Contrast Media [Iodinated Diagnostic Agents]     N&V  . Lisinopril     REACTION: cough  . Naproxen Sodium     REACTION: Breathing difficulty  . Penicillins     REACTION: Welps  . Simvastatin     REACTION: muscle aches    Review of Systems  Review of Systems  Constitutional: Negative for fever and malaise/fatigue.  HENT: Negative for congestion.   Eyes: Negative  for discharge.  Respiratory: Negative for shortness of breath.   Cardiovascular: Negative for chest pain, palpitations and leg swelling.  Gastrointestinal: Negative for nausea, abdominal pain and diarrhea.  Genitourinary: Negative for dysuria.  Musculoskeletal: Positive for back pain and joint pain. Negative for falls.  Skin: Negative for rash.  Neurological: Negative for loss of consciousness and headaches.  Endo/Heme/Allergies: Negative for polydipsia.  Psychiatric/Behavioral: Negative for depression and suicidal ideas. The patient is not nervous/anxious and does not have insomnia.     Objective  BP 122/62  Pulse 88  Temp(Src) 97.5 F (36.4 C) (Oral)  Ht 5' 5.5" (1.664 m)  Wt 209 lb (94.802 kg)  BMI 34.24 kg/m2  SpO2 97%  Physical Exam  Physical Exam  Constitutional: She is oriented to person, place, and time and well-developed, well-nourished, and in no distress. No distress.  HENT:  Head: Normocephalic and atraumatic.  Eyes: Conjunctivae are normal.  Neck: Neck supple. No thyromegaly present.  Cardiovascular: Normal rate, regular rhythm and normal heart sounds.   No murmur heard. Pulmonary/Chest: Effort normal and breath sounds normal. She has no wheezes.  Abdominal: She exhibits no distension and no mass.  Musculoskeletal: She exhibits no edema.  Lymphadenopathy:    She has no cervical adenopathy.  Neurological: She is alert and oriented to person, place, and time.  Skin: Skin is warm and dry. No rash noted. She is not diaphoretic.  Psychiatric: Memory, affect and judgment normal.    Lab Results  Component Value Date   TSH 1.140 12/01/2012   Lab Results  Component Value Date   WBC 4.9 12/01/2012   HGB 10.8* 12/01/2012   HCT 32.6* 12/01/2012   MCV 83.4 12/01/2012   PLT 306 12/01/2012   Lab Results  Component Value Date   CREATININE 0.72 12/01/2012   BUN 10 12/01/2012   NA 140 12/01/2012   K 4.1 12/01/2012   CL 106 12/01/2012   CO2 27 12/01/2012    Lab Results  Component Value Date   ALT 29 12/01/2012   AST 20 12/01/2012   ALKPHOS 73 12/01/2012   BILITOT 0.4 12/01/2012   Lab Results  Component Value Date   CHOL 289* 12/01/2012   Lab Results  Component Value Date   HDL 45 12/01/2012   Lab Results  Component Value Date   LDLCALC 230* 12/01/2012  Lab Results  Component Value Date   TRIG 69 12/01/2012   Lab Results  Component Value Date   CHOLHDL 6.4 12/01/2012     Assessment & Plan  COLONIC POLYPS, HX OF Has seen [facility] in past. Encouraged probiotics, fiber and plenty of fluids.  HYPERTENSION Well controlled at current visit. No changes  HYPERLIPIDEMIA Encouraged avoid trans fats and minimize simple carbs and saturated fats. Declines any further attempts at meds. Recommended, Livalo, Welchol and Trico and patient declined. Add krill oil.   BACK PAIN With RLE pain, actually improving, no changes today, did some PT and found that somewhat helpful. Encouraged activity as tolerated

## 2012-12-20 NOTE — Assessment & Plan Note (Signed)
Well controlled at current visit. No changes

## 2012-12-20 NOTE — Telephone Encounter (Signed)
Spoke with pt, aware will take the lab work order to the lab here in [place] so they will have when she comes back. Pt agreed with this plan.

## 2012-12-20 NOTE — Assessment & Plan Note (Signed)
Has seen [facility] in past. Encouraged probiotics, fiber and plenty of fluids.

## 2012-12-21 ENCOUNTER — Other Ambulatory Visit: Payer: Self-pay | Admitting: Family Medicine

## 2012-12-21 LAB — URINALYSIS
Bilirubin Urine: NEGATIVE
Glucose, UA: NEGATIVE mg/dL
Hgb urine dipstick: NEGATIVE
Ketones, ur: NEGATIVE mg/dL
Protein, ur: NEGATIVE mg/dL
Urobilinogen, UA: 0.2 mg/dL (ref 0.0–1.0)
pH: 6 (ref 5.0–8.0)

## 2012-12-21 LAB — RENAL FUNCTION PANEL
Albumin: 3.7 g/dL (ref 3.5–5.2)
BUN: 8 mg/dL (ref 6–23)
CO2: 25 mEq/L (ref 19–32)
Chloride: 107 mEq/L (ref 96–112)
Creat: 0.71 mg/dL (ref 0.50–1.10)

## 2012-12-21 LAB — VITAMIN B12: Vitamin B-12: 477 pg/mL (ref 211–911)

## 2012-12-21 LAB — CBC
HCT: 31 % — ABNORMAL LOW (ref 36.0–46.0)
Hemoglobin: 10 g/dL — ABNORMAL LOW (ref 12.0–15.0)
MCH: 26.4 pg (ref 26.0–34.0)
MCHC: 32.3 g/dL (ref 30.0–36.0)
MCV: 81.8 fL (ref 78.0–100.0)
RBC: 3.79 MIL/uL — ABNORMAL LOW (ref 3.87–5.11)
RDW: 16.3 % — ABNORMAL HIGH (ref 11.5–15.5)

## 2012-12-21 LAB — BASIC METABOLIC PANEL WITH GFR
BUN: 9 mg/dL (ref 6–23)
CO2: 26 mEq/L (ref 19–32)
Calcium: 9.3 mg/dL (ref 8.4–10.5)
Chloride: 104 mEq/L (ref 96–112)
Creat: 0.7 mg/dL (ref 0.50–1.10)
Glucose, Bld: 94 mg/dL (ref 70–99)
Potassium: 4.1 mEq/L (ref 3.5–5.3)
Sodium: 137 mEq/L (ref 135–145)

## 2012-12-21 LAB — IRON AND TIBC
%SAT: 13 % — ABNORMAL LOW (ref 20–55)
Iron: 56 ug/dL (ref 42–145)

## 2012-12-29 ENCOUNTER — Ambulatory Visit: Payer: Medicare Other | Admitting: Cardiology

## 2013-02-28 ENCOUNTER — Other Ambulatory Visit: Payer: Self-pay | Admitting: Family Medicine

## 2013-02-28 NOTE — Telephone Encounter (Signed)
Rx request to pharmacy/[facility]  

## 2013-03-19 ENCOUNTER — Ambulatory Visit (INDEPENDENT_AMBULATORY_CARE_PROVIDER_SITE_OTHER): Payer: Medicare HMO | Admitting: Family Medicine

## 2013-03-19 ENCOUNTER — Ambulatory Visit (HOSPITAL_BASED_OUTPATIENT_CLINIC_OR_DEPARTMENT_OTHER)
Admission: RE | Admit: 2013-03-19 | Discharge: 2013-03-19 | Disposition: A | Discharge status: Home / Self Care | Payer: Medicare HMO | Source: Ambulatory Visit | Attending: Family Medicine | Admitting: Family Medicine

## 2013-03-19 VITALS — BP 112/66 | HR 77 | Temp 98.2°F | Resp 20 | Ht 65.5 in | Wt 210.1 lb

## 2013-03-19 DIAGNOSIS — M549 Dorsalgia, unspecified: Secondary | ICD-10-CM

## 2013-03-19 DIAGNOSIS — E785 Hyperlipidemia, unspecified: Secondary | ICD-10-CM

## 2013-03-19 DIAGNOSIS — I10 Essential (primary) hypertension: Secondary | ICD-10-CM

## 2013-03-19 DIAGNOSIS — R7309 Other abnormal glucose: Secondary | ICD-10-CM

## 2013-03-19 DIAGNOSIS — M545 Low back pain, unspecified: Secondary | ICD-10-CM | POA: Insufficient documentation

## 2013-03-19 DIAGNOSIS — D649 Anemia, unspecified: Secondary | ICD-10-CM

## 2013-03-19 DIAGNOSIS — E119 Type 2 diabetes mellitus without complications: Secondary | ICD-10-CM

## 2013-03-19 LAB — CBC
HCT: 35.1 % — ABNORMAL LOW (ref 36.0–46.0)
Hemoglobin: 11.7 g/dL — ABNORMAL LOW (ref 12.0–15.0)
MCH: 28.9 pg (ref 26.0–34.0)
MCHC: 33.3 g/dL (ref 30.0–36.0)
MCV: 86.7 fL (ref 78.0–100.0)
Platelets: 308 10*3/uL (ref 150–400)
RBC: 4.05 MIL/uL (ref 3.87–5.11)
RDW: 18.5 % — ABNORMAL HIGH (ref 11.5–15.5)
WBC: 6.3 10*3/uL (ref 4.0–10.5)

## 2013-03-19 LAB — HEMOGLOBIN A1C
Hgb A1c MFr Bld: 5.6 % (ref <5.7)
MEAN PLASMA GLUCOSE: 114 mg/dL (ref <117)

## 2013-03-19 MED ORDER — BACLOFEN 10 MG PO TABS: 10.0000 mg | ORAL_TABLET | Freq: Two times a day (BID) | ORAL | Status: DC | PRN

## 2013-03-19 MED ORDER — METHYLPREDNISOLONE (PAK) 4 MG PO TABS: ORAL_TABLET | ORAL | Status: DC

## 2013-03-19 NOTE — Progress Notes (Signed)
Pre visit review using our clinic review tool, if applicable. No additional management support is needed unless otherwise documented below in the visit note. 

## 2013-03-19 NOTE — Patient Instructions (Signed)
Back Pain, Adult Low back pain is very common. About 1 in 5 people have back pain.The cause of low back pain is rarely dangerous. The pain often gets better over time.About half of people with a sudden onset of back pain feel better in just 2 weeks. About 8 in 10 people feel better by 6 weeks.  CAUSES Some common causes of back pain include:  Strain of the muscles or ligaments supporting the spine.  Wear and tear (degeneration) of the spinal discs.  Arthritis.  Direct injury to the back. DIAGNOSIS Most of the time, the direct cause of low back pain is not known.However, back pain can be treated effectively even when the exact cause of the pain is unknown.Answering your caregiver's questions about your overall health and symptoms is one of the most accurate ways to make sure the cause of your pain is not dangerous. If your caregiver needs more information, he or she may order lab work or imaging tests (X-rays or MRIs).However, even if imaging tests show changes in your back, this usually does not require surgery. HOME CARE INSTRUCTIONS For many people, back pain returns.Since low back pain is rarely dangerous, it is often a condition that people can learn to manageon their own.   Remain active. It is stressful on the back to sit or stand in one place. Do not sit, drive, or stand in one place for more than 30 minutes at a time. Take short walks on level surfaces as soon as pain allows.Try to increase the length of time you walk each day.  Do not stay in bed.Resting more than 1 or 2 days can delay your recovery.  Do not avoid exercise or work.Your body is made to move.It is not dangerous to be active, even though your back may hurt.Your back will likely heal faster if you return to being active before your pain is gone.  Pay attention to your body when you bend and lift. Many people have less discomfortwhen lifting if they bend their knees, keep the load close to their bodies,and  avoid twisting. Often, the most comfortable positions are those that put less stress on your recovering back.  Find a comfortable position to sleep. Use a firm mattress and lie on your side with your knees slightly bent. If you lie on your back, put a pillow under your knees.  Only take over-the-counter or prescription medicines as directed by your caregiver. Over-the-counter medicines to reduce pain and inflammation are often the most helpful.Your caregiver may prescribe muscle relaxant drugs.These medicines help dull your pain so you can more quickly return to your normal activities and healthy exercise.  Put ice on the injured area.  Put ice in a plastic bag.  Place a towel between your skin and the bag.  Leave the ice on for 15-20 minutes, 03-04 times a day for the first 2 to 3 days. After that, ice and heat may be alternated to reduce pain and spasms.  Ask your caregiver about trying back exercises and gentle massage. This may be of some benefit.  Avoid feeling anxious or stressed.Stress increases muscle tension and can worsen back pain.It is important to recognize when you are anxious or stressed and learn ways to manage it.Exercise is a great option. SEEK MEDICAL CARE IF:  You have pain that is not relieved with rest or medicine.  You have pain that does not improve in 1 week.  You have new symptoms.  You are generally not feeling well. SEEK   IMMEDIATE MEDICAL CARE IF:   You have pain that radiates from your back into your legs.  You develop new bowel or bladder control problems.  You have unusual weakness or numbness in your arms or legs.  You develop nausea or vomiting.  You develop abdominal pain.  You feel faint. Document Released: 01/04/2005 Document Revised: 07/06/2011 Document Reviewed: 05/25/2010 [employer] Patient Information 2014 [employer].  

## 2013-03-20 LAB — RENAL FUNCTION PANEL
Albumin: 3.8 g/dL (ref 3.5–5.2)
BUN: 7 mg/dL (ref 6–23)
CHLORIDE: 105 meq/L (ref 96–112)
CO2: 21 mEq/L (ref 19–32)
Calcium: 9.2 mg/dL (ref 8.4–10.5)
Creat: 0.65 mg/dL (ref 0.50–1.10)
GLUCOSE: 137 mg/dL — AB (ref 70–99)
Phosphorus: 3.6 mg/dL (ref 2.3–4.6)
Potassium: 4 mEq/L (ref 3.5–5.3)
Sodium: 139 mEq/L (ref 135–145)

## 2013-03-20 LAB — LIPID PANEL
CHOL/HDL RATIO: 7.6 ratio
Cholesterol: 250 mg/dL — ABNORMAL HIGH (ref 0–200)
HDL: 33 mg/dL — ABNORMAL LOW (ref >39)
LDL Cholesterol: 193 mg/dL — ABNORMAL HIGH (ref 0–99)
Triglycerides: 118 mg/dL (ref <150)
VLDL: 24 mg/dL (ref 0–40)

## 2013-03-20 LAB — HEPATIC FUNCTION PANEL
ALK PHOS: 68 U/L (ref 39–117)
ALT: 31 U/L (ref 0–35)
AST: 20 U/L (ref 0–37)
Albumin: 3.8 g/dL (ref 3.5–5.2)
BILIRUBIN DIRECT: 0.1 mg/dL (ref 0.0–0.3)
BILIRUBIN TOTAL: 0.3 mg/dL (ref 0.2–1.2)
Indirect Bilirubin: 0.2 mg/dL (ref 0.2–1.2)
Total Protein: 6.9 g/dL (ref 6.0–8.3)

## 2013-03-20 LAB — TSH: TSH: 0.93 u[IU]/mL (ref 0.350–4.500)

## 2013-03-20 NOTE — Progress Notes (Addendum)
Quick Note:  FYI:  Patient Informed and voiced understanding. Pt states she will not take a statin and she will get the [facility] [id number] [birth year] 03/24/2013      Progress Note-Follow Up  Subjective  Chief Complaint  Chief Complaint  Patient presents with  . Anemia    pt here for f/u  . Back Pain    since last 2022/05/23, pt hurt her lower baclk, right side. pt stated the pain is on the scale of 10, steady.    HPI  Patient is a 68 year old Caucasian female who is in today for followup. Her major complaint is of worsening back pain and pain in both legs. She notes that ice and heat have not been helpful. She is having trouble finding a comfortable position notes that lying down is the most uncomfortable but no position is actually comfortable. No other acute complaints. No abdominal pain, GI or GU concerns at this time. Denies chest pain, palpitations or shortness of breath. No headaches or recent illness. Is taking medications as prescribed.  Past Medical History  Diagnosis Date  . Nonischemic cardiomyopathy     improved by most recent echocardiogram  . LBBB (left bundle branch block)   . Cancer 1999    Breast; lumpectomy, radiation and chemotherapy  . Colon polyp     unclear pathology  . Hyperlipidemia   . Hypertension   . Arthritis     left knee pain, MRI in 2008-tricompartmental  degenerate changes, mucoid degeneration of ACL, post horn meniscal tear and ant horn meniscal tear  . Low back pain     left L3-4 injected  . Unspecified constipation 04/22/2012  . Anemia 07/16/2012    Past Surgical History  Procedure Laterality Date  . Appendectomy  05-23-2007    Dr. Lucia Gaskins  . Carpal tunnel release  05-22-2005    Dr. Daylene Katayama  . Abdominal hysterectomy    . Breast surgery      lumpectomy  . Tonsillectomy    . Knee arthroscopy      Right    Family History  Problem Relation Age of Onset  . Heart attack Mother 63  . Diabetes Sister   . Diabetes Brother   .  Coronary artery disease Other     Female first degree relative <60  . Hyperlipidemia Other   . Hypertension Other   . Cancer Other     prostate, 1st degree relative <50    History   Social History  . Marital Status: Widowed    Spouse Name: N/A    Number of Children: 2  . Years of Education: N/A   Occupational History  .     Social History Main Topics  . Smoking status: Former Smoker    Quit date: 01/18/1970  . Smokeless tobacco: Never Used  . Alcohol Use: Yes     Comment: Rare  . Drug Use: Not on file  . Sexual Activity: Not on file   Other Topics Concern  . Not on file   Social History Narrative   Widow - husband died in 2003-05-23   Occupation - retired from working in Radiation protection practitioner   2 daughter - on lives in [place], other in [place]   Remote history of tobacco but none in 40 years   Alcohol use - no    Current Outpatient Prescriptions on File Prior to Visit  Medication Sig Dispense Refill  . Alpha-Lipoic Acid 200 MG CAPS Take by mouth.      Marland Kitchen  carvedilol (COREG) 6.25 MG tablet TAKE ONE TABLET BY MOUTH TWICE DAILY WITH A MEAL  180 tablet  0  . cholecalciferol (VITAMIN D) 1000 UNITS tablet Take 1,000 Units by mouth daily.      . Ferrous Fumarate-Folic Acid (HEMOCYTE-F) 324-1 MG TABS Take 1 tablet by mouth daily.  30 each  3  . losartan (COZAAR) 25 MG tablet Take 1 tablet (25 mg total) by mouth daily.  90 tablet  3  . meclizine (ANTIVERT) 25 MG tablet Take 1 tablet (25 mg total) by mouth 3 (three) times daily as needed for dizziness or nausea.  45 tablet  1  . potassium chloride SA (K-DUR,KLOR-CON) 20 MEQ tablet Take 20 mEq by mouth once a week. 1/2 tablet once a week for a total of 10 meq.      . promethazine (PHENERGAN) 12.5 MG tablet Take 1 tablet (12.5 mg total) by mouth every 6 (six) hours as needed for nausea.  30 tablet  0   No current facility-administered medications on file prior to visit.    Allergies  Allergen Reactions  . Contrast Media [Iodinated  Diagnostic Agents]     N&V  . Lisinopril     REACTION: cough  . Naproxen Sodium     REACTION: Breathing difficulty  . Penicillins     REACTION: Welps  . Simvastatin     REACTION: muscle aches    Review of Systems  Review of Systems  Constitutional: Negative for fever and malaise/fatigue.  HENT: Negative for congestion.   Eyes: Negative for discharge.  Respiratory: Negative for shortness of breath.   Cardiovascular: Negative for chest pain, palpitations and leg swelling.  Gastrointestinal: Negative for nausea, abdominal pain and diarrhea.  Genitourinary: Negative for dysuria.  Musculoskeletal: Negative for falls.  Skin: Negative for rash.  Neurological: Negative for loss of consciousness and headaches.  Endo/Heme/Allergies: Negative for polydipsia.  Psychiatric/Behavioral: Negative for depression and suicidal ideas. The patient is not nervous/anxious and does not have insomnia.     Objective  BP 112/66  Pulse 77  Temp(Src) 98.2 F (36.8 C) (Oral)  Resp 20  Ht 5' 5.5" (1.664 m)  Wt 210 lb 1.3 oz (95.292 kg)  BMI 34.42 kg/m2  SpO2 100%  Physical Exam  Physical Exam  Constitutional: She is oriented to person, place, and time and well-developed, well-nourished, and in no distress. No distress.  HENT:  Head: Normocephalic and atraumatic.  Eyes: Conjunctivae are normal.  Neck: Neck supple. No thyromegaly present.  Cardiovascular: Normal rate, regular rhythm and normal heart sounds.   No murmur heard. Pulmonary/Chest: Effort normal and breath sounds normal. She has no wheezes.  Abdominal: She exhibits no distension and no mass.  Musculoskeletal: She exhibits no edema.  Lymphadenopathy:    She has no cervical adenopathy.  Neurological: She is alert and oriented to person, place, and time.  Skin: Skin is warm and dry. No rash noted. She is not diaphoretic.  Psychiatric: Memory, affect and judgment normal.    Lab Results  Component Value Date   TSH 0.930 03/19/2013    Lab Results  Component Value Date   WBC 6.3 03/19/2013   HGB 11.7* 03/19/2013   HCT 35.1* 03/19/2013   MCV 86.7 03/19/2013   PLT 308 03/19/2013   Lab Results  Component Value Date   CREATININE 0.65 03/19/2013   BUN 7 03/19/2013   NA 139 03/19/2013   K 4.0 03/19/2013   CL 105 03/19/2013   CO2 21 03/19/2013   Lab  Results  Component Value Date   ALT 31 03/19/2013   AST 20 03/19/2013   ALKPHOS 68 03/19/2013   BILITOT 0.3 03/19/2013   Lab Results  Component Value Date   CHOL 250* 03/19/2013   Lab Results  Component Value Date   HDL 33* 03/19/2013   Lab Results  Component Value Date   LDLCALC 193* 03/19/2013   Lab Results  Component Value Date   TRIG 118 03/19/2013   Lab Results  Component Value Date   CHOLHDL 7.6 03/19/2013     Assessment & Plan  HYPERTENSION Well controlled no changes  HYPERLIPIDEMIA Unfortunately cholesterol up but patient declining statins, encouraged increased exercise avoid trans/saturated fats and simple carbs.   BACK PAIN Increasing with arthritic changes in xray. Given Baclofen to try prn and referred to neurosurgery due to radicular symptoms L>R leg.  DIABETES MELLITUS, TYPE II, BORDERLINE Glucose climbing but A1C 5.6, minimize simple carbs. Continue to monitor  Anemia Resolved with hemocyte will continue for a few more months then d/c and monitor    ______

## 2013-03-24 ENCOUNTER — Encounter: Payer: Self-pay | Admitting: Family Medicine

## 2013-03-24 NOTE — Assessment & Plan Note (Signed)
Increasing with arthritic changes in xray. Given Baclofen to try prn and referred to neurosurgery due to radicular symptoms L>R leg.

## 2013-03-24 NOTE — Assessment & Plan Note (Signed)
Unfortunately cholesterol up but patient declining statins, encouraged increased exercise avoid trans/saturated fats and simple carbs.

## 2013-03-24 NOTE — Assessment & Plan Note (Signed)
Glucose climbing but A1C 5.6, minimize simple carbs. Continue to monitor

## 2013-03-24 NOTE — Assessment & Plan Note (Signed)
Well controlled no changes 

## 2013-03-24 NOTE — Assessment & Plan Note (Signed)
Resolved with hemocyte will continue for a few more months then d/c and monitor

## 2013-05-02 NOTE — Telephone Encounter (Signed)
Lab order

## 2013-05-11 ENCOUNTER — Ambulatory Visit: Payer: Medicare HMO | Admitting: Rehabilitation

## 2013-05-21 ENCOUNTER — Other Ambulatory Visit: Payer: Self-pay | Admitting: Hematology & Oncology

## 2013-05-21 DIAGNOSIS — Z1231 Encounter for screening mammogram for malignant neoplasm of breast: Secondary | ICD-10-CM

## 2013-05-28 ENCOUNTER — Other Ambulatory Visit: Payer: Self-pay | Admitting: Family Medicine

## 2013-05-28 ENCOUNTER — Ambulatory Visit (HOSPITAL_BASED_OUTPATIENT_CLINIC_OR_DEPARTMENT_OTHER)
Admission: RE | Admit: 2013-05-28 | Discharge: 2013-05-28 | Disposition: A | Discharge status: Home / Self Care | Payer: Medicare HMO | Source: Ambulatory Visit | Attending: Hematology & Oncology | Admitting: Hematology & Oncology

## 2013-05-28 ENCOUNTER — Other Ambulatory Visit (HOSPITAL_BASED_OUTPATIENT_CLINIC_OR_DEPARTMENT_OTHER): Payer: Commercial Managed Care - HMO | Admitting: Lab

## 2013-05-28 ENCOUNTER — Ambulatory Visit (HOSPITAL_BASED_OUTPATIENT_CLINIC_OR_DEPARTMENT_OTHER): Payer: Commercial Managed Care - HMO | Admitting: Hematology & Oncology

## 2013-05-28 ENCOUNTER — Telehealth: Payer: Self-pay | Admitting: Family Medicine

## 2013-05-28 VITALS — BP 133/77 | HR 89 | Temp 98.2°F | Resp 18 | Ht 65.5 in | Wt 221.0 lb

## 2013-05-28 DIAGNOSIS — C50919 Malignant neoplasm of unspecified site of unspecified female breast: Secondary | ICD-10-CM

## 2013-05-28 DIAGNOSIS — Z853 Personal history of malignant neoplasm of breast: Secondary | ICD-10-CM

## 2013-05-28 DIAGNOSIS — Z9010 Acquired absence of unspecified breast and nipple: Secondary | ICD-10-CM | POA: Insufficient documentation

## 2013-05-28 DIAGNOSIS — Z1231 Encounter for screening mammogram for malignant neoplasm of breast: Secondary | ICD-10-CM | POA: Insufficient documentation

## 2013-05-28 DIAGNOSIS — R062 Wheezing: Secondary | ICD-10-CM

## 2013-05-28 DIAGNOSIS — Z9221 Personal history of antineoplastic chemotherapy: Secondary | ICD-10-CM | POA: Insufficient documentation

## 2013-05-28 DIAGNOSIS — Z923 Personal history of irradiation: Secondary | ICD-10-CM | POA: Insufficient documentation

## 2013-05-28 DIAGNOSIS — R0602 Shortness of breath: Secondary | ICD-10-CM | POA: Insufficient documentation

## 2013-05-28 LAB — CBC WITH DIFFERENTIAL (CANCER CENTER ONLY)
BASO#: 0 10*3/uL (ref 0.0–0.2)
BASO%: 0.6 % (ref 0.0–2.0)
EOS%: 5.2 % (ref 0.0–7.0)
Eosinophils Absolute: 0.3 10*3/uL (ref 0.0–0.5)
HEMATOCRIT: 36.1 % (ref 34.8–46.6)
HGB: 12.1 g/dL (ref 11.6–15.9)
LYMPH#: 2.5 10*3/uL (ref 0.9–3.3)
LYMPH%: 38.6 % (ref 14.0–48.0)
MCH: 31 pg (ref 26.0–34.0)
MCHC: 33.5 g/dL (ref 32.0–36.0)
MCV: 93 fL (ref 81–101)
MONO#: 0.6 10*3/uL (ref 0.1–0.9)
MONO%: 8.7 % (ref 0.0–13.0)
NEUT#: 3.1 10*3/uL (ref 1.5–6.5)
NEUT%: 46.9 % (ref 39.6–80.0)
PLATELETS: 252 10*3/uL (ref 145–400)
RBC: 3.9 10*6/uL (ref 3.70–5.32)
RDW: 15.4 % (ref 11.1–15.7)
WBC: 6.5 10*3/uL (ref 3.9–10.0)

## 2013-05-28 NOTE — Telephone Encounter (Signed)
Needs referral to Dr Marin Olp, pt has [facility] and was seen today.

## 2013-05-29 LAB — COMPREHENSIVE METABOLIC PANEL
ALT: 67 U/L — AB (ref 0–35)
AST: 54 U/L — ABNORMAL HIGH (ref 0–37)
Albumin: 3.8 g/dL (ref 3.5–5.2)
Alkaline Phosphatase: 68 U/L (ref 39–117)
BILIRUBIN TOTAL: 0.5 mg/dL (ref 0.2–1.2)
BUN: 11 mg/dL (ref 6–23)
CALCIUM: 9.2 mg/dL (ref 8.4–10.5)
CO2: 23 mEq/L (ref 19–32)
CREATININE: 0.81 mg/dL (ref 0.50–1.10)
Chloride: 109 mEq/L (ref 96–112)
Glucose, Bld: 82 mg/dL (ref 70–99)
Potassium: 4.1 mEq/L (ref 3.5–5.3)
SODIUM: 139 meq/L (ref 135–145)
Total Protein: 6.8 g/dL (ref 6.0–8.3)

## 2013-05-29 LAB — VITAMIN D 25 HYDROXY (VIT D DEFICIENCY, FRACTURES): Vit D, 25-Hydroxy: 36 ng/mL (ref 30–89)

## 2013-05-29 NOTE — Progress Notes (Signed)
Hematology and Oncology Follow Up Visit  Brenda Marsh [id number] 1945/06/21 68 y.o. 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  Current Therapy:    Observation     Interim History:  Ms.  Marsh is back for followup. We see her yearly.  She has had some cardiac issues. She has been hospitalized in the past. She's complaining of some wheezing. This has only been going over the past couple days. She is a little bit of a cough. It is nonproductive. She's had no fever. There's been no change in medications.  We did get a chest x-ray on her. The chest x-ray did not show any infiltrate or effusions or cardiomegaly.  Her last tumor marker for the CA 27.29 was 44 which was 2 years ago. Has not found any obvious metastatic disease.  She her mammogram today. We will await the results.  She's had no bleeding. He's been no headache. She's had no nausea vomiting.  Her back does hurt. She injured it while moving a piece of furniture a couple months ago.     Medications: Current outpatient prescriptions:carvedilol (COREG) 6.25 MG tablet, TAKE ONE TABLET BY MOUTH TWICE DAILY WITH A MEAL, Disp: 180 tablet, Rfl: 0;  cholecalciferol (VITAMIN D) 1000 UNITS tablet, Take 1,000 Units by mouth daily., Disp: , Rfl: ;  losartan (COZAAR) 25 MG tablet, Take 1 tablet (25 mg total) by mouth daily., Disp: 90 tablet, Rfl: 3 meclizine (ANTIVERT) 25 MG tablet, Take 1 tablet (25 mg total) by mouth 3 (three) times daily as needed for dizziness or nausea., Disp: 45 tablet, Rfl: 1;  promethazine (PHENERGAN) 12.5 MG tablet, Take 1 tablet (12.5 mg total) by mouth every 6 (six) hours as needed for nausea., Disp: 30 tablet, Rfl: 0;  Alpha-Lipoic Acid 200 MG CAPS, Take by mouth., Disp: , Rfl:  baclofen (LIORESAL) 10 MG tablet, Take 1 tablet (10 mg total) by mouth 2 (two) times daily as needed for muscle spasms., Disp: 30 each, Rfl: 1;  Ferrous Fumarate-Folic Acid  (HEMOCYTE-F) 324-1 MG TABS, Take 1 tablet by mouth daily., Disp: 30 each, Rfl: 3;  methylPREDNIsolone (MEDROL DOSPACK) 4 MG tablet, follow package directions, Disp: 21 tablet, Rfl: 0 potassium chloride SA (K-DUR,KLOR-CON) 20 MEQ tablet, Take 20 mEq by mouth once a week. 1/2 tablet once a week for a total of 10 meq., Disp: , Rfl:   Allergies:  Allergies  Allergen Reactions  . Contrast Media [Iodinated Diagnostic Agents]     N&V  . Lisinopril     REACTION: cough  . Naproxen Sodium     REACTION: Breathing difficulty  . Penicillins     REACTION: Welps  . Simvastatin     REACTION: muscle aches    Past Medical History, Surgical history, Social history, and Family History were reviewed and updated.  Review of Systems: As above  Physical Exam:  height is 5' 5.5" (1.664 m) and weight is 221 lb (100.245 kg). Her oral temperature is 98.2 F (36.8 C). Her blood pressure is 133/77 and her pulse is 89. Her respiration is 18 and oxygen saturation is 100%.   Somewhat obese Afro-American female. Head and neck exam shows no ocular or oral lesion. There are no palpable cervical or supraclavicular lymph nodes. Lungs are clear. No wheezes arousals are noted. Cardiac exam regular rate and rhythm with no murmurs rubs or bruits. Abdomen is soft. She's good bowel sounds. She is mildly obese. There is no  palpable liver or spleen tip. Back exam no tenderness over the spine or hips. Extremities shows no clubbing cyanosis or edema patient chronic lymphedema of the left arm. Her breast exam shows right breast with no masses edema or or erythema. There is no right axillary adenopathy. Left breast shows a well-healed lumpectomy the 2:00 position. This was like infection of the left breast from radiation surgery. No masses noted in the left breast. There is no left axillary adenopathy. Neurological exam is nonfocal.  Lab Results  Component Value Date   WBC 6.5 05/28/2013   HGB 12.1 05/28/2013   HCT 36.1 05/28/2013    MCV 93 05/28/2013   PLT 252 05/28/2013     Chemistry      Component Value Date/Time   NA 139 05/28/2013 0920   K 4.1 05/28/2013 0920   CL 109 05/28/2013 0920   CO2 23 05/28/2013 0920   BUN 11 05/28/2013 0920   CREATININE 0.81 05/28/2013 0920   CREATININE 0.65 03/19/2013 1214      Component Value Date/Time   CALCIUM 9.2 05/28/2013 0920   ALKPHOS 68 05/28/2013 0920   AST 54* 05/28/2013 0920   ALT 67* 05/28/2013 0920   BILITOT 0.5 05/28/2013 0920         Impression and Plan: Brenda Marsh is a 68 year old Afro-American female. She has a history of stage IIA infiltrating ductal carcinoma left breast. She is out from treatment now for probably 12 years. Again we do not see any obvious recurrence or metastatic disease. The CA 27.39 is minimally elevated but this has always been the case.  I think if the wheezing or breathing issues continue, she probably will have to go to her family doctor for an evaluation. Her chest x-ray was okay.  She does have some mild degree of cardiomyopathy. A. echocardiogram done in November of last year showed an ejection fraction of only 30-35%.  I will see her back in one more year.   Brenda Napoleon, MD 5/12/20156:57 AM

## 2013-05-30 ENCOUNTER — Telehealth: Payer: Self-pay | Admitting: Hematology & Oncology

## 2013-05-30 ENCOUNTER — Telehealth: Payer: Self-pay | Admitting: *Deleted

## 2013-05-30 ENCOUNTER — Ambulatory Visit (HOSPITAL_BASED_OUTPATIENT_CLINIC_OR_DEPARTMENT_OTHER)
Admission: RE | Admit: 2013-05-30 | Discharge: 2013-05-30 | Disposition: A | Discharge status: Home / Self Care | Payer: Medicare HMO | Source: Ambulatory Visit | Attending: Cardiology | Admitting: Cardiology

## 2013-05-30 ENCOUNTER — Ambulatory Visit (INDEPENDENT_AMBULATORY_CARE_PROVIDER_SITE_OTHER): Payer: Commercial Managed Care - HMO | Admitting: Cardiology

## 2013-05-30 ENCOUNTER — Encounter: Payer: Self-pay | Admitting: Cardiology

## 2013-05-30 VITALS — BP 118/74 | HR 84 | Ht 65.5 in | Wt 217.1 lb

## 2013-05-30 DIAGNOSIS — R0602 Shortness of breath: Secondary | ICD-10-CM

## 2013-05-30 DIAGNOSIS — I679 Cerebrovascular disease, unspecified: Secondary | ICD-10-CM

## 2013-05-30 DIAGNOSIS — I739 Peripheral vascular disease, unspecified: Secondary | ICD-10-CM

## 2013-05-30 DIAGNOSIS — I779 Disorder of arteries and arterioles, unspecified: Secondary | ICD-10-CM | POA: Insufficient documentation

## 2013-05-30 DIAGNOSIS — I499 Cardiac arrhythmia, unspecified: Secondary | ICD-10-CM

## 2013-05-30 DIAGNOSIS — I428 Other cardiomyopathies: Secondary | ICD-10-CM

## 2013-05-30 DIAGNOSIS — I10 Essential (primary) hypertension: Secondary | ICD-10-CM

## 2013-05-30 MED ORDER — FUROSEMIDE 20 MG PO TABS: 20.0000 mg | ORAL_TABLET | Freq: Every day | ORAL | Status: DC

## 2013-05-30 NOTE — Assessment & Plan Note (Signed)
Schedule followup carotid Dopplers. She has not tolerated statins previously. She declines aspirin as it has caused GI upset previously.

## 2013-05-30 NOTE — Patient Instructions (Addendum)
Your physician recommends that you schedule a follow-up appointment in: [place]  Your physician recommends that you return for lab work in: [facility] has requested that you have a carotid duplex. This test is an ultrasound of the carotid arteries in your neck. It looks at blood flow through these arteries that supply the brain with blood. Allow one hour for this exam. There are no restrictions or special instructions.

## 2013-05-30 NOTE — Telephone Encounter (Signed)
Called patient to let her know that her CXR was normal per dr. ennever 

## 2013-05-30 NOTE — Assessment & Plan Note (Signed)
Blood pressure controlled. Continue present medications. 

## 2013-05-30 NOTE — Assessment & Plan Note (Signed)
Patient's LV function has decreased. Continue beta blocker and Cozaar at present dose. I am hesitant to increase these medications as her blood pressure is borderline. She is complaining of dyspnea and is mildly volume overloaded. Add Lasix 20 mg daily. Check potassium and renal function in one week. Once medications titrated we'll plan to repeat echocardiogram. If ejection fraction less than 35% will need ICD.

## 2013-05-30 NOTE — Progress Notes (Signed)
HPI: FU dilated cardiomyopathy. Patient has a history of left bundle branch block. She underwent cardiac catheterization in [facility] in March of 2010 for reduced LV function. Ejection fraction was 35%. The left main was normal. The LAD had a proximal 25-30% lesion. The circumflex was normal. The right coronary artery had a 15% lesion. Patient was treated medically. LV function improved. However, her most recent echocardiogram in November 2014 showed an ejection fraction of 30-35%. There was restrictive filling. There was mild aortic and mitral regurgitation as well as mild left atrial enlargement. The right ventricle was dilated with reduced function. 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 Followup recommended in 6 months. Since she was last seen, She notes increased dyspnea on exertion for approximately 5 days. There is orthopnea and mild pedal edema. No chest pain.  Current Outpatient Prescriptions  Medication Sig Dispense Refill  . carvedilol (COREG) 6.25 MG tablet TAKE 1 TABLET BY MOUTH TWICE DAILY WITH A MEAL  180 tablet  0  . losartan (COZAAR) 25 MG tablet Take 1 tablet (25 mg total) by mouth daily.  90 tablet  3  . meclizine (ANTIVERT) 25 MG tablet Take 1 tablet (25 mg total) by mouth 3 (three) times daily as needed for dizziness or nausea.  45 tablet  1  . Multiple Vitamin (MULTIVITAMIN) capsule Take 1 capsule by mouth daily.      . cholecalciferol (VITAMIN D) 1000 UNITS tablet Take 1,000 Units by mouth daily.       No current facility-administered medications for this visit.     Past Medical History  Diagnosis Date  . Nonischemic cardiomyopathy     improved by most recent echocardiogram  . LBBB (left bundle branch block)   . Cancer 1999    Breast; lumpectomy, radiation and chemotherapy  . Colon polyp     unclear pathology  . Hyperlipidemia   . Hypertension   . Arthritis     left knee pain, MRI in 2008-tricompartmental   degenerate changes, mucoid degeneration of ACL, post horn meniscal tear and ant horn meniscal tear  . Low back pain     left L3-4 injected  . Unspecified constipation 04/22/2012  . Anemia 07/16/2012    Past Surgical History  Procedure Laterality Date  . Appendectomy  May 14, 2007    Dr. Lucia Gaskins  . Carpal tunnel release  05-13-05    Dr. Daylene Katayama  . Abdominal hysterectomy    . Breast surgery      lumpectomy  . Tonsillectomy    . Knee arthroscopy      Right    History   Social History  . Marital Status: Widowed    Spouse Name: N/A    Number of Children: 2  . Years of Education: N/A   Occupational History  .     Social History Main Topics  . Smoking status: Former Smoker    Quit date: 01/18/1970  . Smokeless tobacco: Never Used  . Alcohol Use: Yes     Comment: Rare  . Drug Use: Not on file  . Sexual Activity: Not on file   Other Topics Concern  . Not on file   Social History Narrative   Widow - husband died in 2003/05/14   Occupation - retired from working in Radiation protection practitioner   2 daughter - on lives in [place], other in [place]   Remote history of tobacco but none in 40 years   Alcohol use -  no    ROS: no fevers or chills, productive cough, hemoptysis, dysphasia, odynophagia, melena, hematochezia, dysuria, hematuria, rash, seizure activity, orthopnea, PND, pedal edema, claudication. Remaining systems are negative.  Physical Exam: Well-developed obese in no acute distress.  Skin is warm and dry.  HEENT is normal.  Neck is supple.  Chest is clear to auscultation with normal expansion.  Cardiovascular exam is regular rate and rhythm.  Abdominal exam nontender or distended. No masses palpated. Extremities show Trace edema. Lymphedema noted left upper extremity neuro grossly intact  ECG Sinus rhythm at a rate of 84. Occasional PVC. Left bundle branch block.

## 2013-05-30 NOTE — Assessment & Plan Note (Signed)
Continue diet. Intolerant to statins. 

## 2013-05-30 NOTE — Telephone Encounter (Signed)
Message copied by Rico Ala on Wed May 30, 2013 10:50 AM ------      Message from: Burney Gauze R      Created: Mon May 28, 2013  7:09 PM       Please call and let her know that her chest x-ray is normal. No fluid or pneumonia. Next. Brenda Marsh ------

## 2013-05-30 NOTE — Telephone Encounter (Signed)
Mailed 05-2014 schedule °

## 2013-06-25 ENCOUNTER — Ambulatory Visit (HOSPITAL_BASED_OUTPATIENT_CLINIC_OR_DEPARTMENT_OTHER): Payer: Medicare HMO

## 2013-06-25 ENCOUNTER — Ambulatory Visit: Payer: Medicare HMO | Admitting: Family Medicine

## 2013-07-18 ENCOUNTER — Other Ambulatory Visit: Payer: Self-pay | Admitting: *Deleted

## 2013-07-18 DIAGNOSIS — Z853 Personal history of malignant neoplasm of breast: Secondary | ICD-10-CM

## 2013-07-18 MED ORDER — MECLIZINE HCL 25 MG PO TABS: 25.0000 mg | ORAL_TABLET | Freq: Three times a day (TID) | ORAL | Status: DC | PRN

## 2013-07-28 LAB — BASIC METABOLIC PANEL WITH GFR
BUN: 9 mg/dL (ref 6–23)
CHLORIDE: 104 meq/L (ref 96–112)
CO2: 23 meq/L (ref 19–32)
CREATININE: 0.69 mg/dL (ref 0.50–1.10)
Calcium: 9.6 mg/dL (ref 8.4–10.5)
GFR, Est African American: >89 mL/min
GFR, Est Non African American: >89 mL/min
Glucose, Bld: 97 mg/dL (ref 70–99)
Potassium: 3.7 mEq/L (ref 3.5–5.3)
Sodium: 140 mEq/L (ref 135–145)

## 2013-07-28 LAB — BRAIN NATRIURETIC PEPTIDE: Brain Natriuretic Peptide: 337.7 pg/mL — ABNORMAL HIGH (ref 0.0–100.0)

## 2013-08-01 ENCOUNTER — Encounter: Payer: Self-pay | Admitting: *Deleted

## 2013-08-01 ENCOUNTER — Encounter: Payer: Self-pay | Admitting: Cardiology

## 2013-08-01 ENCOUNTER — Ambulatory Visit (INDEPENDENT_AMBULATORY_CARE_PROVIDER_SITE_OTHER): Payer: Commercial Managed Care - HMO | Admitting: Cardiology

## 2013-08-01 VITALS — BP 134/78 | HR 72 | Ht 65.0 in | Wt 211.0 lb

## 2013-08-01 DIAGNOSIS — I428 Other cardiomyopathies: Secondary | ICD-10-CM

## 2013-08-01 DIAGNOSIS — I429 Cardiomyopathy, unspecified: Secondary | ICD-10-CM

## 2013-08-01 DIAGNOSIS — I6529 Occlusion and stenosis of unspecified carotid artery: Secondary | ICD-10-CM

## 2013-08-01 DIAGNOSIS — E785 Hyperlipidemia, unspecified: Secondary | ICD-10-CM

## 2013-08-01 MED ORDER — LOSARTAN POTASSIUM 50 MG PO TABS: 50.0000 mg | ORAL_TABLET | Freq: Every day | ORAL | Status: DC

## 2013-08-01 NOTE — Assessment & Plan Note (Signed)
Continue present medications. Increase Cozaar to 50 mg daily. Check potassium and renal function in one week. Repeat echocardiogram. If ejection fraction less than 35% would consider ICD although not clear she would be agreeable.

## 2013-08-01 NOTE — Assessment & Plan Note (Signed)
Patient discontinued her aspirin as she felt it was causing anemia. Intolerant to statins. Previous recommendations were for repeat carotids this past April. She is not agreeable to this. She states she will have him in October which will be one year from her previous study. She understands the risk of CVA.

## 2013-08-01 NOTE — Assessment & Plan Note (Signed)
Intolerant to statins. 

## 2013-08-01 NOTE — Assessment & Plan Note (Signed)
Blood pressure controlled. Continue present medications. 

## 2013-08-01 NOTE — Progress Notes (Signed)
HPI: FU dilated cardiomyopathy. Patient has a history of left bundle branch block. She underwent cardiac catheterization in [facility] in March of 2010 for reduced LV function. Ejection fraction was 35%. The left main was normal. The LAD had a proximal 25-30% lesion. The circumflex was normal. The right coronary artery had a 15% lesion. Patient was treated medically. LV function improved. However, her most recent echocardiogram in November 2014 showed an ejection fraction of 30-35%. There was restrictive filling. There was mild aortic and mitral regurgitation as well as mild left atrial enlargement. The right ventricle was dilated with reduced function. 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 Followup recommended in 6 months. Since she was last seen, Her dyspnea is much improved. No chest pain. No orthopnea, PND or pedal edema.   Current Outpatient Prescriptions  Medication Sig Dispense Refill  . carvedilol (COREG) 6.25 MG tablet TAKE 1 TABLET BY MOUTH TWICE DAILY WITH A MEAL  180 tablet  0  . cholecalciferol (VITAMIN D) 1000 UNITS tablet Take 1,000 Units by mouth daily.      . furosemide (LASIX) 20 MG tablet Take 1 tablet (20 mg total) by mouth daily.  90 tablet  3  . losartan (COZAAR) 25 MG tablet Take 1 tablet (25 mg total) by mouth daily.  90 tablet  3  . meclizine (ANTIVERT) 25 MG tablet Take 1 tablet (25 mg total) by mouth 3 (three) times daily as needed for dizziness or nausea.  45 tablet  1  . Multiple Vitamin (MULTIVITAMIN) capsule Take 1 capsule by mouth daily.       No current facility-administered medications for this visit.     Past Medical History  Diagnosis Date  . Nonischemic cardiomyopathy     improved by most recent echocardiogram  . LBBB (left bundle branch block)   . Cancer 1999    Breast; lumpectomy, radiation and chemotherapy  . Colon polyp     unclear pathology  . Hyperlipidemia   . Hypertension   . Arthritis    left knee pain, MRI in 2008-tricompartmental  degenerate changes, mucoid degeneration of ACL, post horn meniscal tear and ant horn meniscal tear  . Low back pain     left L3-4 injected  . Unspecified constipation 04/22/2012  . Anemia 07/16/2012    Past Surgical History  Procedure Laterality Date  . Appendectomy  2007/04/28    Dr. Lucia Gaskins  . Carpal tunnel release  April 27, 2005    Dr. Daylene Katayama  . Abdominal hysterectomy    . Breast surgery      lumpectomy  . Tonsillectomy    . Knee arthroscopy      Right    History   Social History  . Marital Status: Widowed    Spouse Name: N/A    Number of Children: 2  . Years of Education: N/A   Occupational History  .     Social History Main Topics  . Smoking status: Former Smoker    Quit date: 01/18/1970  . Smokeless tobacco: Never Used  . Alcohol Use: Yes     Comment: Rare  . Drug Use: Not on file  . Sexual Activity: Not on file   Other Topics Concern  . Not on file   Social History Narrative   Widow - husband died in 04-28-03   Occupation - retired from working in Radiation protection practitioner   2 daughter - on lives in [place], other in [place]  Remote history of tobacco but none in 40 years   Alcohol use - no    ROS: no fevers or chills, productive cough, hemoptysis, dysphasia, odynophagia, melena, hematochezia, dysuria, hematuria, rash, seizure activity, orthopnea, PND, pedal edema, claudication. Remaining systems are negative.  Physical Exam: Well-developed well-nourished in no acute distress.  Skin is warm and dry.  HEENT is normal.  Neck is supple.  Chest is clear to auscultation with normal expansion.  Cardiovascular exam is regular rate and rhythm.  Abdominal exam nontender or distended. No masses palpated. Extremities show No edema in lower extremities. Lymphedema in left upper extremity. neuro grossly intact

## 2013-08-01 NOTE — Patient Instructions (Signed)
Your physician wants you to follow-up in: [place] will receive a reminder letter in the mail two months in advance. If you don't receive a letter, please call our office to schedule the follow-up appointment.   Your physician has requested that you have a carotid duplex. This test is an ultrasound of the carotid arteries in your neck. It looks at blood flow through these arteries that supply the brain with blood. Allow one hour for this exam. There are no restrictions or special instructions.  DUE IN OCT  INCREASE LOSARTAN TO 88 MG ONCE DAILY  Your physician recommends that you return for lab work in: [place] physician has requested that you have an echocardiogram. Echocardiography is a painless test that uses sound waves to create images of your heart. It provides your doctor with information about the size and shape of your heart and how well your heart's chambers and valves are working. This procedure takes approximately one hour. There are no restrictions for this procedure.

## 2013-08-20 ENCOUNTER — Ambulatory Visit (HOSPITAL_COMMUNITY): Payer: Medicare HMO | Attending: Cardiology | Admitting: Cardiology

## 2013-08-20 ENCOUNTER — Other Ambulatory Visit: Payer: Self-pay | Admitting: Family Medicine

## 2013-08-20 DIAGNOSIS — I428 Other cardiomyopathies: Secondary | ICD-10-CM | POA: Diagnosis not present

## 2013-08-20 DIAGNOSIS — I429 Cardiomyopathy, unspecified: Secondary | ICD-10-CM

## 2013-08-20 NOTE — Progress Notes (Signed)
Echo performed. 

## 2013-08-22 ENCOUNTER — Encounter: Payer: Self-pay | Admitting: Cardiology

## 2013-08-22 NOTE — Telephone Encounter (Signed)
Returning your call. °

## 2013-08-22 NOTE — Telephone Encounter (Signed)
This encounter was created in error - please disregard.

## 2013-08-28 ENCOUNTER — Telehealth: Payer: Self-pay | Admitting: Cardiology

## 2013-08-28 NOTE — Telephone Encounter (Signed)
Returned call to patient. Advised her that Brenda Marsh was not in the office today but will be back tomorrow and will be notified that she call. She is agreeable to having ICD implantation and does not seem as if she feels the need to have an OV to discuss, seems to be OK with just getting it set up. RN answered a few questions regarding typical hospital LOS and that only the arm on the side of device implant would be "immobilized"   She would like a return call to answer questions/set up ICD implantation.

## 2013-08-28 NOTE — Telephone Encounter (Signed)
Brenda Marsh called in stating that Dr. Jacalyn Lefevre nurse called her a few days ago wanting to set up ab appt to discuss getting a defibrillator. Please call  Thanks

## 2013-08-29 NOTE — Telephone Encounter (Signed)
Spoke with pt, Follow up scheduled with dr Lovena Le to discuss ICD

## 2013-10-08 ENCOUNTER — Other Ambulatory Visit: Payer: Self-pay

## 2013-10-08 ENCOUNTER — Encounter: Payer: Self-pay | Admitting: Internal Medicine

## 2013-10-08 ENCOUNTER — Ambulatory Visit (INDEPENDENT_AMBULATORY_CARE_PROVIDER_SITE_OTHER): Payer: Commercial Managed Care - HMO | Admitting: Internal Medicine

## 2013-10-08 ENCOUNTER — Encounter: Payer: Self-pay | Admitting: *Deleted

## 2013-10-08 VITALS — BP 140/72 | HR 78 | Ht 65.5 in | Wt 216.8 lb

## 2013-10-08 DIAGNOSIS — I428 Other cardiomyopathies: Secondary | ICD-10-CM

## 2013-10-08 DIAGNOSIS — R0989 Other specified symptoms and signs involving the circulatory and respiratory systems: Secondary | ICD-10-CM

## 2013-10-08 DIAGNOSIS — Z01812 Encounter for preprocedural laboratory examination: Secondary | ICD-10-CM

## 2013-10-08 DIAGNOSIS — I10 Essential (primary) hypertension: Secondary | ICD-10-CM

## 2013-10-08 DIAGNOSIS — R9430 Abnormal result of cardiovascular function study, unspecified: Secondary | ICD-10-CM

## 2013-10-08 DIAGNOSIS — I5022 Chronic systolic (congestive) heart failure: Secondary | ICD-10-CM

## 2013-10-08 DIAGNOSIS — I429 Cardiomyopathy, unspecified: Secondary | ICD-10-CM

## 2013-10-08 NOTE — Assessment & Plan Note (Signed)
She has class 2 symptoms and severe LV dysfunction (EF25%) with LBBB and a QRS duration of 180 ms. She is on maximal medical therapy. Her EF has been depressed for over 5 years. I have discussed the risk, goals, benefits, and expectations of biventricular ICD implantation with the patient, and she wishes to proceed.

## 2013-10-08 NOTE — Patient Instructions (Signed)
Your physician recommends that you continue on your current medications as directed. Please refer to the Current Medication list given to you today.  Your physician recommends that you return for pre-procedure lab work in: 11/12/13  Your physician has recommended that you have a defibrillator inserted. An implantable cardioverter defibrillator (ICD) is a small device that is placed in your chest or, in rare cases, your abdomen. This device uses electrical pulses or shocks to help control life-threatening, irregular heartbeats that could lead the heart to suddenly stop beating (sudden cardiac arrest). Leads are attached to the ICD that goes into your heart. This is done in the hospital and usually requires an overnight stay. Please see the instruction sheet given to you today for more information.  Your wound check is scheduled for 11/26/13 at 3 pm at [address].

## 2013-10-08 NOTE — Progress Notes (Signed)
HPI Brenda Marsh is referred today by Dr. Stanford Breed for evaluation of possible ICD implant. She is a pleasant 68 yo woman with a h/o chemotherapy for breast CA, who was initially discovered to have LV dysfunction by echo in 2010. She had a cath at that time demonstrating no obstructive coronary artery disease. On medical therapy, her ejection fraction initially improved. She saw Dr. Stanford Breed over a year ago, and at that time a repeat echo demonstrated an ejection fraction of 35%. Her medications were titrated. Repeat echo done several months ago demonstrated an ejection fraction of 25%. She has class II heart failure symptoms. She has left bundle branch block with a QRS duration of 180 ms. She has not had syncope. Minimal peripheral edema. She denies dietary or medical noncompliance.   Allergies  Allergen Reactions  . Contrast Media [Iodinated Diagnostic Agents]     N&V  . Lisinopril     REACTION: cough  . Naproxen Sodium     REACTION: Breathing difficulty  . Penicillins     REACTION: Welps  . Shellfish Allergy     Cant breath   . Simvastatin     REACTION: muscle aches     Current Outpatient Prescriptions  Medication Sig Dispense Refill  . carvedilol (COREG) 6.25 MG tablet TAKE 1 TABLET BY MOUTH TWICE DAILY WITH A MEAL  180 tablet  0  . cholecalciferol (VITAMIN D) 1000 UNITS tablet Take 1,000 Units by mouth daily.      . furosemide (LASIX) 20 MG tablet Take 1 tablet (20 mg total) by mouth daily.  90 tablet  3  . losartan (COZAAR) 50 MG tablet Take 1 tablet (50 mg total) by mouth daily.  90 tablet  3  . meclizine (ANTIVERT) 25 MG tablet Take 1 tablet (25 mg total) by mouth 3 (three) times daily as needed for dizziness or nausea.  45 tablet  1  . Multiple Vitamin (MULTIVITAMIN) capsule Take 1 capsule by mouth daily.       No current facility-administered medications for this visit.     Past Medical History  Diagnosis Date  . Nonischemic cardiomyopathy     improved by most  recent echocardiogram  . LBBB (left bundle branch block)   . Cancer 1999    Breast; lumpectomy, radiation and chemotherapy  . Colon polyp     unclear pathology  . Hyperlipidemia   . Hypertension   . Arthritis     left knee pain, MRI in 2008-tricompartmental  degenerate changes, mucoid degeneration of ACL, post horn meniscal tear and ant horn meniscal tear  . Low back pain     left L3-4 injected  . Unspecified constipation 04/22/2012  . Anemia 07/16/2012    ROS:   All systems reviewed and negative except as noted in the HPI.   Past Surgical History  Procedure Laterality Date  . Appendectomy  2009    Dr. Lucia Gaskins  . Carpal tunnel release  2007    Dr. Daylene Katayama  . Abdominal hysterectomy    . Breast surgery      lumpectomy  . Tonsillectomy    . Knee arthroscopy      Right     Family History  Problem Relation Age of Onset  . Heart attack Mother 41  . Diabetes Sister   . Diabetes Brother   . Coronary artery disease Other     Female first degree relative <60  . Hyperlipidemia Other   . Hypertension Other   .  Cancer Other     prostate, 1st degree relative <50     History   Social History  . Marital Status: Widowed    Spouse Name: N/A    Number of Children: 2  . Years of Education: N/A   Occupational History  .     Social History Main Topics  . Smoking status: Former Smoker    Quit date: 01/18/1970  . Smokeless tobacco: Never Used  . Alcohol Use: Yes     Comment: Rare  . Drug Use: Not on file  . Sexual Activity: Not on file   Other Topics Concern  . 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 Abd:  soft, positive bowel sounds, no organomegally, no rebound, no guarding Ext:  2 plus pulses, no edema, no cyanosis, no clubbing Skin:  No rashes no nodules Neuro:  CN II through XII intact, motor grossly intact  EKG - nsr with lbbb   Assess/Plan:

## 2013-10-08 NOTE — Assessment & Plan Note (Signed)
Her blood pressure is fairly well compensated. She will continue her current medications. She is instructed to maintain a low-sodium diet.

## 2013-11-06 ENCOUNTER — Telehealth: Payer: Self-pay | Admitting: Family Medicine

## 2013-11-06 MED ORDER — CARVEDILOL 6.25 MG PO TABS: 6.2500 mg | ORAL_TABLET | Freq: Two times a day (BID) | ORAL | Status: DC

## 2013-11-06 NOTE — Telephone Encounter (Signed)
RX sent  Please advise pt that she needs an appt scheduled for additional refills

## 2013-11-06 NOTE — Telephone Encounter (Signed)
Informed pt .

## 2013-11-06 NOTE — Telephone Encounter (Signed)
Caller name: Jazmyne  Call back number [phone] Pharmacy: [facility]  Reason for call:  Wants a refill on Rx carvedilol (COREG) 6.25 MG tablet

## 2013-11-08 ENCOUNTER — Encounter (HOSPITAL_COMMUNITY): Payer: Self-pay | Admitting: Pharmacy Technician

## 2013-11-12 ENCOUNTER — Ambulatory Visit (HOSPITAL_BASED_OUTPATIENT_CLINIC_OR_DEPARTMENT_OTHER)
Admission: RE | Admit: 2013-11-12 | Discharge: 2013-11-12 | Disposition: A | Discharge status: Home / Self Care | Payer: Medicare HMO | Source: Ambulatory Visit | Attending: Cardiology | Admitting: Cardiology

## 2013-11-12 ENCOUNTER — Other Ambulatory Visit (INDEPENDENT_AMBULATORY_CARE_PROVIDER_SITE_OTHER): Payer: Commercial Managed Care - HMO | Admitting: *Deleted

## 2013-11-12 DIAGNOSIS — I679 Cerebrovascular disease, unspecified: Secondary | ICD-10-CM | POA: Insufficient documentation

## 2013-11-12 DIAGNOSIS — I428 Other cardiomyopathies: Secondary | ICD-10-CM

## 2013-11-12 DIAGNOSIS — Z01812 Encounter for preprocedural laboratory examination: Secondary | ICD-10-CM

## 2013-11-12 DIAGNOSIS — I429 Cardiomyopathy, unspecified: Secondary | ICD-10-CM

## 2013-11-12 LAB — BASIC METABOLIC PANEL
BUN: 9 mg/dL (ref 6–23)
CO2: 29 mEq/L (ref 19–32)
CREATININE: 0.8 mg/dL (ref 0.4–1.2)
Calcium: 9.2 mg/dL (ref 8.4–10.5)
Chloride: 105 mEq/L (ref 96–112)
GFR: 91.64 mL/min (ref >60.00)
Glucose, Bld: 80 mg/dL (ref 70–99)
Potassium: 3.8 mEq/L (ref 3.5–5.1)
Sodium: 139 mEq/L (ref 135–145)

## 2013-11-12 LAB — CBC WITH DIFFERENTIAL/PLATELET
BASOS ABS: 0 10*3/uL (ref 0.0–0.1)
Basophils Relative: 0.5 % (ref 0.0–3.0)
EOS PCT: 4.9 % (ref 0.0–5.0)
Eosinophils Absolute: 0.3 10*3/uL (ref 0.0–0.7)
HCT: 38.4 % (ref 36.0–46.0)
Hemoglobin: 12.6 g/dL (ref 12.0–15.0)
LYMPHS ABS: 2.9 10*3/uL (ref 0.7–4.0)
Lymphocytes Relative: 48 % — ABNORMAL HIGH (ref 12.0–46.0)
MCHC: 32.7 g/dL (ref 30.0–36.0)
MCV: 93.1 fl (ref 78.0–100.0)
MONOS PCT: 5.8 % (ref 3.0–12.0)
Monocytes Absolute: 0.3 10*3/uL (ref 0.1–1.0)
NEUTROS PCT: 40.8 % — AB (ref 43.0–77.0)
Neutro Abs: 2.4 10*3/uL (ref 1.4–7.7)
PLATELETS: 212 10*3/uL (ref 150.0–400.0)
RBC: 4.13 Mil/uL (ref 3.87–5.11)
RDW: 15.6 % — AB (ref 11.5–15.5)
WBC: 6 10*3/uL (ref 4.0–10.5)

## 2013-11-15 ENCOUNTER — Encounter: Payer: Self-pay | Admitting: Cardiology

## 2013-11-15 DIAGNOSIS — I509 Heart failure, unspecified: Secondary | ICD-10-CM | POA: Diagnosis present

## 2013-11-15 DIAGNOSIS — E785 Hyperlipidemia, unspecified: Secondary | ICD-10-CM | POA: Diagnosis not present

## 2013-11-15 DIAGNOSIS — Z853 Personal history of malignant neoplasm of breast: Secondary | ICD-10-CM | POA: Diagnosis not present

## 2013-11-15 DIAGNOSIS — I5022 Chronic systolic (congestive) heart failure: Secondary | ICD-10-CM | POA: Diagnosis not present

## 2013-11-15 DIAGNOSIS — I429 Cardiomyopathy, unspecified: Secondary | ICD-10-CM | POA: Diagnosis not present

## 2013-11-15 DIAGNOSIS — M1990 Unspecified osteoarthritis, unspecified site: Secondary | ICD-10-CM | POA: Diagnosis not present

## 2013-11-15 DIAGNOSIS — I447 Left bundle-branch block, unspecified: Secondary | ICD-10-CM | POA: Diagnosis not present

## 2013-11-15 DIAGNOSIS — I10 Essential (primary) hypertension: Secondary | ICD-10-CM | POA: Diagnosis not present

## 2013-11-15 DIAGNOSIS — Z87891 Personal history of nicotine dependence: Secondary | ICD-10-CM | POA: Diagnosis not present

## 2013-11-15 MED ORDER — SODIUM CHLORIDE 0.9 % IR SOLN
80.0000 mg | Status: DC
  Filled 2013-11-15: qty 2

## 2013-11-15 MED ORDER — SODIUM CHLORIDE 0.9 % IV SOLN
INTRAVENOUS | Status: DC
  Administered 2013-11-16: 06:00:00 via INTRAVENOUS

## 2013-11-15 MED ORDER — VANCOMYCIN HCL IN DEXTROSE 1-5 GM/200ML-% IV SOLN
1000.0000 mg | INTRAVENOUS | Status: DC
  Filled 2013-11-15: qty 200

## 2013-11-15 NOTE — Telephone Encounter (Signed)
This encounter was created in error - please disregard.

## 2013-11-15 NOTE — Telephone Encounter (Signed)
Please call,pt did not say what she wanted.

## 2013-11-16 ENCOUNTER — Ambulatory Visit (HOSPITAL_COMMUNITY)
Admission: RE | Admit: 2013-11-16 | Discharge: 2013-11-17 | Disposition: A | Discharge status: Home / Self Care | Payer: Medicare HMO | Source: Ambulatory Visit | Attending: Internal Medicine | Admitting: Internal Medicine

## 2013-11-16 ENCOUNTER — Encounter (HOSPITAL_COMMUNITY)
Admission: RE | Disposition: A | Discharge status: Home / Self Care | Payer: Self-pay | Source: Ambulatory Visit | Attending: Internal Medicine

## 2013-11-16 ENCOUNTER — Encounter (HOSPITAL_COMMUNITY): Payer: Self-pay | Admitting: *Deleted

## 2013-11-16 DIAGNOSIS — I10 Essential (primary) hypertension: Secondary | ICD-10-CM | POA: Insufficient documentation

## 2013-11-16 DIAGNOSIS — I5022 Chronic systolic (congestive) heart failure: Secondary | ICD-10-CM | POA: Insufficient documentation

## 2013-11-16 DIAGNOSIS — I5042 Chronic combined systolic (congestive) and diastolic (congestive) heart failure: Secondary | ICD-10-CM | POA: Diagnosis present

## 2013-11-16 DIAGNOSIS — I429 Cardiomyopathy, unspecified: Secondary | ICD-10-CM | POA: Insufficient documentation

## 2013-11-16 DIAGNOSIS — I428 Other cardiomyopathies: Secondary | ICD-10-CM

## 2013-11-16 DIAGNOSIS — Z853 Personal history of malignant neoplasm of breast: Secondary | ICD-10-CM | POA: Insufficient documentation

## 2013-11-16 DIAGNOSIS — E785 Hyperlipidemia, unspecified: Secondary | ICD-10-CM | POA: Insufficient documentation

## 2013-11-16 DIAGNOSIS — Z95810 Presence of automatic (implantable) cardiac defibrillator: Secondary | ICD-10-CM

## 2013-11-16 DIAGNOSIS — I447 Left bundle-branch block, unspecified: Secondary | ICD-10-CM | POA: Insufficient documentation

## 2013-11-16 DIAGNOSIS — M1990 Unspecified osteoarthritis, unspecified site: Secondary | ICD-10-CM | POA: Insufficient documentation

## 2013-11-16 DIAGNOSIS — Z87891 Personal history of nicotine dependence: Secondary | ICD-10-CM | POA: Insufficient documentation

## 2013-11-16 HISTORY — PX: BI-VENTRICULAR IMPLANTABLE CARDIOVERTER DEFIBRILLATOR: SHX5459

## 2013-11-16 HISTORY — PX: BI-VENTRICULAR IMPLANTABLE CARDIOVERTER DEFIBRILLATOR  (CRT-D): SHX5747

## 2013-11-16 LAB — SURGICAL PCR SCREEN
MRSA, PCR: NEGATIVE
Staphylococcus aureus: NEGATIVE

## 2013-11-16 SURGERY — BI-VENTRICULAR IMPLANTABLE CARDIOVERTER DEFIBRILLATOR  (CRT-D)
Anesthesia: LOCAL

## 2013-11-16 MED ORDER — MECLIZINE HCL 25 MG PO TABS
25.0000 mg | ORAL_TABLET | Freq: Three times a day (TID) | ORAL | Status: DC | PRN
  Filled 2013-11-16: qty 1

## 2013-11-16 MED ORDER — MIDAZOLAM HCL 5 MG/5ML IJ SOLN
INTRAMUSCULAR | Status: AC
  Filled 2013-11-16: qty 5

## 2013-11-16 MED ORDER — ONDANSETRON HCL 4 MG/2ML IJ SOLN: 4.0000 mg | Freq: Four times a day (QID) | INTRAMUSCULAR | Status: DC | PRN

## 2013-11-16 MED ORDER — VANCOMYCIN HCL IN DEXTROSE 1-5 GM/200ML-% IV SOLN
1000.0000 mg | Freq: Two times a day (BID) | INTRAVENOUS | Status: AC
  Administered 2013-11-16: 1000 mg via INTRAVENOUS
  Filled 2013-11-16: qty 200

## 2013-11-16 MED ORDER — VITAMIN B-12 1000 MCG PO TABS
1000.0000 ug | ORAL_TABLET | Freq: Every day | ORAL | Status: DC
  Administered 2013-11-16 – 2013-11-17 (×2): 1000 ug via ORAL
  Filled 2013-11-16 (×2): qty 1

## 2013-11-16 MED ORDER — LIDOCAINE HCL (PF) 1 % IJ SOLN
INTRAMUSCULAR | Status: AC
  Filled 2013-11-16: qty 60

## 2013-11-16 MED ORDER — PROMETHAZINE HCL 25 MG PO TABS: 12.5000 mg | ORAL_TABLET | Freq: Four times a day (QID) | ORAL | Status: DC | PRN

## 2013-11-16 MED ORDER — FAMOTIDINE IN NACL 20-0.9 MG/50ML-% IV SOLN
INTRAVENOUS | Status: AC
  Filled 2013-11-16: qty 50

## 2013-11-16 MED ORDER — CARVEDILOL 6.25 MG PO TABS
6.2500 mg | ORAL_TABLET | Freq: Two times a day (BID) | ORAL | Status: DC
  Administered 2013-11-16 – 2013-11-17 (×2): 6.25 mg via ORAL
  Filled 2013-11-16 (×5): qty 1

## 2013-11-16 MED ORDER — LIDOCAINE HCL (PF) 1 % IJ SOLN
INTRAMUSCULAR | Status: AC
  Filled 2013-11-16: qty 30

## 2013-11-16 MED ORDER — FENTANYL CITRATE 0.05 MG/ML IJ SOLN
INTRAMUSCULAR | Status: AC
  Filled 2013-11-16: qty 2

## 2013-11-16 MED ORDER — METHYLPREDNISOLONE SODIUM SUCC 125 MG IJ SOLR
INTRAMUSCULAR | Status: AC
  Filled 2013-11-16: qty 2

## 2013-11-16 MED ORDER — MUPIROCIN 2 % EX OINT: 1.0000 "application " | TOPICAL_OINTMENT | Freq: Once | CUTANEOUS | Status: DC

## 2013-11-16 MED ORDER — DIPHENHYDRAMINE HCL 50 MG/ML IJ SOLN
25.0000 mg | Freq: Once | INTRAMUSCULAR | Status: AC
  Administered 2013-11-16: 25 mg via INTRAVENOUS

## 2013-11-16 MED ORDER — ONDANSETRON HCL 4 MG/2ML IJ SOLN
4.0000 mg | Freq: Once | INTRAMUSCULAR | Status: AC
  Administered 2013-11-16: 4 mg via INTRAVENOUS

## 2013-11-16 MED ORDER — METHYLPREDNISOLONE SODIUM SUCC 125 MG IJ SOLR
62.5000 mg | Freq: Once | INTRAMUSCULAR | Status: AC
  Administered 2013-11-16: 62.5 mg via INTRAVENOUS

## 2013-11-16 MED ORDER — FAMOTIDINE IN NACL 20-0.9 MG/50ML-% IV SOLN
20.0000 mg | Freq: Once | INTRAVENOUS | Status: AC
  Administered 2013-11-16: 20 mg via INTRAVENOUS

## 2013-11-16 MED ORDER — LOSARTAN POTASSIUM 50 MG PO TABS
50.0000 mg | ORAL_TABLET | Freq: Every day | ORAL | Status: DC
  Administered 2013-11-16 – 2013-11-17 (×2): 50 mg via ORAL
  Filled 2013-11-16 (×2): qty 1

## 2013-11-16 MED ORDER — FUROSEMIDE 20 MG PO TABS
20.0000 mg | ORAL_TABLET | Freq: Every day | ORAL | Status: DC
  Administered 2013-11-16: 20 mg via ORAL
  Filled 2013-11-16 (×2): qty 1

## 2013-11-16 MED ORDER — ACETAMINOPHEN 325 MG PO TABS: 325.0000 mg | ORAL_TABLET | ORAL | Status: DC | PRN

## 2013-11-16 MED ORDER — ONDANSETRON HCL 4 MG/2ML IJ SOLN
INTRAMUSCULAR | Status: AC
  Filled 2013-11-16: qty 2

## 2013-11-16 MED ORDER — MUPIROCIN 2 % EX OINT
TOPICAL_OINTMENT | CUTANEOUS | Status: AC
  Administered 2013-11-16: 1
  Filled 2013-11-16: qty 22

## 2013-11-16 MED ORDER — DIPHENHYDRAMINE HCL 50 MG/ML IJ SOLN
INTRAMUSCULAR | Status: AC
  Filled 2013-11-16: qty 1

## 2013-11-16 NOTE — Progress Notes (Signed)
Patient arrived to unit 2W28 at this time. Alert and in stable condition.

## 2013-11-16 NOTE — Discharge Summary (Signed)
ELECTROPHYSIOLOGY PROCEDURE DISCHARGE SUMMARY    Patient ID: Brenda Marsh,  MRN: [id number], DOB/AGE: 68-Jul-1947 68 y.o.  Admit date: 11/16/2013 Discharge date: 11/17/2013  Primary Care Physician: [name], MD Primary Cardiologist: Stanford Breed Electrophysiologist: Lovena Le  Primary Discharge Diagnosis:  Non ischemic cardiomyopathy, congestive heart failure and LBBB status post CRTD implantation this admission  Secondary Discharge Diagnosis:  1.  Breast cancer s/p chemotherapy 2.  Hyperlipidemia 3   Hypertension 4.  Arthritis  Allergies  Allergen Reactions  . Contrast Media [Iodinated Diagnostic Agents]     N&V  . Lisinopril     REACTION: cough  . Naproxen Sodium     REACTION: Breathing difficulty  . Penicillins     REACTION: Welps  . Shellfish Allergy     Cant breath   . Simvastatin     REACTION: muscle aches     Procedures This Admission:  1.  Implantation of a MDT CRTD on 11-16-13 by Dr Lovena Le.  The patient received a MDT CRTD with model number 5076 right atrial lead, 6935 right ventricular lead, and MDT quadripolar left ventricular lead.  DFT's were successful at 105 J.  There were no immediate post procedure complications. 2.  CXR on 11-17-13 demonstrated no pneumothorax status post device implantation.   Brief HPI: [name] is a 68 y.o. female was referred to electrophysiology in the outpatient setting for consideration of CRTD implantation.  Past medical history includes non ischemic cardiomyopathy, congestive heart failure and LBBB.  The patient has persistent LV dysfunction despite guideline directed therapy.  Risks, benefits, and alternatives to CRTD implantation were reviewed with the patient who wished to proceed.   Hospital Course:  The patient was admitted and underwent implantation of a MDT CRTD with details as outlined above.   She was monitored on telemetry overnight which demonstrated sinus rhythm with ventricular pacing.  Right  chest was without hematoma or ecchymosis.  The device was interrogated and found to be functioning normally.  CXR was obtained and demonstrated no pneumothorax status post device implantation.  Wound care, arm mobility, and restrictions were reviewed with the patient.  Dr Lovena Le examined the patient and considered them stable for discharge to home.   The patient's discharge medications include an ARB (Losartan) and beta blocker (Carvedilol).   Discharge Vitals: Blood pressure 151/62, pulse 75, temperature 98.5 F (36.9 C), temperature source Oral, resp. rate 18, height 5' 5.5" (1.664 m), weight 217 lb (98.431 kg), SpO2 100.00%.   Labs:   Lab Results  Component Value Date   WBC 6.0 11/12/2013   HGB 12.6 11/12/2013   HCT 38.4 11/12/2013   MCV 93.1 11/12/2013   PLT 212.0 11/12/2013     Recent Labs Lab 11/12/13 1206  NA 139  K 3.8  CL 105  CO2 29  BUN 9  CREATININE 0.8  CALCIUM 9.2  GLUCOSE 80     Discharge Medications:    Medication List    ASK your doctor about these medications       carvedilol 6.25 MG tablet  Commonly known as:  COREG  Take 1 tablet (6.25 mg total) by mouth 2 (two) times daily with a meal.     furosemide 20 MG tablet  Commonly known as:  LASIX  Take 1 tablet (20 mg total) by mouth daily.     lidocaine 5 %  Commonly known as:  LIDODERM  Place 1 patch onto the skin daily as needed (for back pain). Remove & Discard patch  within 12 hours or as directed by MD     losartan 50 MG tablet  Commonly known as:  COZAAR  Take 1 tablet (50 mg total) by mouth daily.     meclizine 25 MG tablet  Commonly known as:  ANTIVERT  Take 1 tablet (25 mg total) by mouth 3 (three) times daily as needed for dizziness or nausea.     multivitamin capsule  Take 1 capsule by mouth daily.     OVER THE COUNTER MEDICATION  Apply 1 application topically daily as needed (OTC roll on pain reliever).     promethazine 12.5 MG tablet  Commonly known as:  PHENERGAN  Take 12.5  mg by mouth every 6 (six) hours as needed for nausea or vomiting.     vitamin B-12 1000 MCG tablet  Commonly known as:  CYANOCOBALAMIN  Take 1,000 mcg by mouth daily.     Vitamin D 400 UNITS capsule  Take 400 Units by mouth daily.        Disposition:     Duration of Discharge Encounter: less than 30 minutes including physician time.  Signed,  Mikle Bosworth.D.

## 2013-11-16 NOTE — Progress Notes (Signed)
Received pt from lab in no acute distress. arousable , lethargic, hob at 30. Bandage in place to RU chest. Paced at 67 per monitor. Radial pulses present. Denies pain. Report called to 2w RN Daphne Tom-Johnson.

## 2013-11-16 NOTE — Progress Notes (Signed)
Pt arrived to cath lab holding area alert .  Pt was able to void before procedure with any difficulty.  Pre-meds ordered for allergy and given per MD verbal order order.  Pt to procedure area via stretcher.

## 2013-11-16 NOTE — CV Procedure (Signed)
SURGEON:  Cristopher Peru, MD      PREPROCEDURE DIAGNOSES:   1. Nonischemic cardiomyopathy.   2. New York Heart Association class III, heart failure chronically.   3. Left bundle-branch block.      POSTPROCEDURE DIAGNOSES:   1. Nonischemic cardiomyopathy.   2. New York Heart Association class III heart failure chronically.   3. Left bundle-branch block.      PROCEDURES:    1. Biventricular ICD implantation.  2. Defibrillation threshold testing 3. Venography of the coronary sinus     INTRODUCTION:  Brenda Marsh is a 68 y.o. female with a nonischemic CM (EF 25%), NYHA Class III CHF, and LBBB QRS morophology. At this time, she meets MADIT II/ SCD-HeFT criteria for ICD implantation for primary prevention of sudden death.  Given LBBB, the patient may also be expected to benefit from resynchronization therapy. The patient has been treated with an optimal medical regimen but continues to have a depressed ejection fraction and NYHA Class III CHF symptoms.  she therefore  presents today for a biventricular ICD implantation.      DESCRIPTION OF PROCEDURE:  Informed written consent was obtained and the   patient was brought to the electrophysiology lab in the fasting state. The patient was adequately sedated with intravenous Versed and Fentanyl as outlined in the nursing report.  The patient's right chest was prepped and draped in the usual sterile fashion by the EP lab staff.  The skin overlying the right deltopectoral region was infiltrated with lidocaine for local analgesia.  A 6-cm incision was made over the right deltopectoral region.  A right subcutaneous defibrillator pocket was fashioned using a combination of sharp and blunt dissection.  Electrocautery was used to assure hemostasis.    RA/RV Lead Placement: The right axillary vein was cannulated with fluoroscopic visualization.  No contrast was required for this endeavor.  Through the right axillary vein, a Medtronic [id number] (serial #  [id number]  ) right atrial lead and a Medtronic [id number] (serial number [id number]) right ventricular defibrillator lead were advanced with fluoroscopic visualization into the right atrial appendage and right ventricular apical septal positions respectively.  Initial atrial lead P-waves measured 3.5 mV with an impedance of 586 ohms and a threshold of 1.4 volts at 0.5 milliseconds.  The right ventricular lead R-wave measured 13 mV with impedance of 531 ohms and a threshold of 0.5 volts at 0.5 milliseconds.   LV Lead Placement:  A Medtronic guide was advanced through the right axillary vein into the low lateral right atrium. A 6 french hexapolar EP catheter was introduced through the medtronic guide and used to cannulate the coronary sinus. A coronary sinus nonselective venogram was performed by hand injection of nonionic contrast. This demonstrated a nice lateral vein.  A 0.014 angioplasty guide wire was introduced through the Medtronic Guide and advanced into the coronary sinus. A Medtronic (serial number [id number]) LV lead was advanced through the lateral vein into the distal lateral branch. This was approximately two-thirds from the base to the apex in a very lateral  position. In this location with 2-3 bipolar configuration, the left ventricular lead R-waves measured 13 mV with impedance of 731 ohms and a threshold of 2.5 volt at 0.5 Milliseconds with no diaphragmatic stimulation observed when pacing at 10 volts output. The Medtronic guide was therefore removed.  All three leads were secured to the pectoralis fascia using #2 silk suture over the suture sleeves. The pocket then irrigated with copious gentamicin solution.   Device Placement:  The leads were then  connected to a Medtronic (serial  Number [id number]) biventricular ICD.  The defibrillator was placed into the  pocket.  The pocket was then closed in 2 layers with 2.0 Vicryl suture  for the subcutaneous and subcuticular layers.  Steri-Strips and a   sterile dressing were then applied.   DFT Testing: Defibrillation Threshold testing was then performed. Ventricular fibrillation was induced with a T shock.  Adequate sensing of ventricular  fibrillation was observed with minimal dropout with a programmed sensitivity of 1.36mV.  The patient was successfully defibrillated to sinus rhythm with a single 20 joules shock delivered from the device with an impedance of 65 ohms in a duration of 9.0 seconds.  The patient remained in sinus rhythm thereafter.  There were no early apparent complication     CONCLUSIONS:   1. Nonischemic cardiomyopathy with Left bundle-branch block and chronic New York Heart Association class III heart failure.   2. Successful biventricular ICD implantation.   3. DFT less than or equal to 20 joules.   4. No early apparent complications.   Cristopher Peru, MD  10:01 AM 11/16/2013

## 2013-11-16 NOTE — Progress Notes (Signed)
Transferrerd to 2w in no acute distress, per bed. No chest pain, no sob. Alert arousable remains lethargic, but answers questions appropiately. Placed on monitor in rm 28, handed offto charge Therapist, sports.

## 2013-11-16 NOTE — H&P (Signed)
  ICD Criteria  Current LVEF:25% ;Obtained > or = 1 month ago and < or = 3 months ago.  NYHA Functional Classification: Class III  Heart Failure History:  Yes, Duration of heart failure since onset is > 9 months  Non-Ischemic Dilated Cardiomyopathy History:  Yes, timeframe is > 9 months  Atrial Fibrillation/Atrial Flutter:  No.  Ventricular Tachycardia History:  No.  Cardiac Arrest History:  No  History of Syndromes with Risk of Sudden Death:  No.  Previous ICD:  No.  Electrophysiology Study: No.  Prior MI: No.  PPM: No.  OSA:  No  Patient Life Expectancy of >=1 year: Yes.  Anticoagulation Therapy:  Patient is NOT on anticoagulation therapy.   Beta Blocker Therapy:  Yes.   Ace Inhibitor/ARB Therapy:  Yes.

## 2013-11-16 NOTE — H&P (Signed)
HPI Brenda Marsh is referred today by Dr. Stanford Breed for evaluation of possible ICD implant. She is a pleasant 68 yo woman with a h/o chemotherapy for breast CA, who was initially discovered to have LV dysfunction by echo in 2010. She had a cath at that time demonstrating no obstructive coronary artery disease. On medical therapy, her ejection fraction initially improved. She saw Dr. Stanford Breed over a year ago, and at that time a repeat echo demonstrated an ejection fraction of 35%. Her medications were titrated. Repeat echo done several months ago demonstrated an ejection fraction of 25%. She has class II heart failure symptoms. She has left bundle branch block with a QRS duration of 150 ms. She has not had syncope. Minimal peripheral edema. She denies dietary or medical noncompliance.    Allergies   Allergen  Reactions   .  Contrast Media [Iodinated Diagnostic Agents]         N&V   .  Lisinopril         REACTION: cough   .  Naproxen Sodium         REACTION: Breathing difficulty   .  Penicillins         REACTION: Welps   .  Shellfish Allergy         Cant breath    .  Simvastatin         REACTION: muscle aches           Current Outpatient Prescriptions   Medication  Sig  Dispense  Refill   .  carvedilol (COREG) 6.25 MG tablet  TAKE 1 TABLET BY MOUTH TWICE DAILY WITH A MEAL   180 tablet   0   .  cholecalciferol (VITAMIN D) 1000 UNITS tablet  Take 1,000 Units by mouth daily.         .  furosemide (LASIX) 20 MG tablet  Take 1 tablet (20 mg total) by mouth daily.   90 tablet   3   .  losartan (COZAAR) 50 MG tablet  Take 1 tablet (50 mg total) by mouth daily.   90 tablet   3   .  meclizine (ANTIVERT) 25 MG tablet  Take 1 tablet (25 mg total) by mouth 3 (three) times daily as needed for dizziness or nausea.   45 tablet   1   .  Multiple Vitamin (MULTIVITAMIN) capsule  Take 1 capsule by mouth daily.             No current facility-administered medications for this visit.           Past  Medical History   Diagnosis  Date   .  Nonischemic cardiomyopathy         improved by most recent echocardiogram   .  LBBB (left bundle branch block)     .  Cancer  1999       Breast; lumpectomy, radiation and chemotherapy   .  Colon polyp         unclear pathology   .  Hyperlipidemia     .  Hypertension     .  Arthritis         left knee pain, MRI in 2008-tricompartmental  degenerate changes, mucoid degeneration of ACL, post horn meniscal tear and ant horn meniscal tear   .  Low back pain         left L3-4 injected   .  Unspecified constipation  04/22/2012   .  Anemia  07/16/2012  ROS:    All systems reviewed and negative except as noted in the HPI.      Past Surgical History   Procedure  Laterality  Date   .  Appendectomy    2007/05/14       Dr. Lucia Gaskins   .  Carpal tunnel release    2005-05-13       Dr. Daylene Katayama   .  Abdominal hysterectomy       .  Breast surgery           lumpectomy   .  Tonsillectomy       .  Knee arthroscopy           Right           Family History   Problem  Relation  Age of Onset   .  Heart attack  Mother  58   .  Diabetes  Sister     .  Diabetes  Brother     .  Coronary artery disease  Other         Female first degree relative <60   .  Hyperlipidemia  Other     .  Hypertension  Other     .  Cancer  Other         prostate, 1st degree relative <50           History       Social History   .  Marital Status:  Widowed       Spouse Name:  N/A       Number of Children:  2   .  Years of Education:  N/A       Occupational History   .           Social History Main Topics   .  Smoking status:  Former Smoker       Quit date:  01/18/1970   .  Smokeless tobacco:  Never Used   .  Alcohol Use:  Yes         Comment: Rare   .  Drug Use:  Not on file   .  Sexual Activity:  Not on file       Other Topics  Concern   .  Not on file       Social History Narrative     Widow - husband died in 05/14/03     Occupation - retired from working  in Radiation protection practitioner     2 daughter - on lives in [place], other in [place]     Remote history of tobacco but none in 40 years     Alcohol use - no          BP 140/72  Pulse 78  Ht 5' 5.5" (1.664 m)  Wt 216 lb 12.8 oz (98.34 kg)  BMI 35.52 kg/m2   Physical Exam:   Well appearing but obese 68 yo woman, NAD HEENT: Unremarkable Neck:  7 cm JVD, no thyromegally Back:  No CVA tenderness Lungs:  Clear with no wheezes HEART:  Regular rate rhythm, no murmurs, no rubs, no clicks, split S2. Abd:  soft, positive bowel sounds, no organomegally, no rebound, no guarding Ext:  2 plus pulses, no edema, no cyanosis, no clubbing Skin:  No rashes no nodules Neuro:  CN II through XII intact, motor grossly intact   EKG - nsr with lbbb     Assess/Plan:         Chronic systolic  heart failure -   She has class 3 symptoms and severe LV dysfunction (EF25%) with LBBB and a QRS duration of 150 ms. She is on maximal medical therapy. Her EF has been depressed for over 5 years. I have discussed the risk, goals, benefits, and expectations of biventricular ICD implantation with the patient, and she wishes to proceed.         HYPERTENSION - Her blood pressure is fairly well compensated. She will continue her current medications. She is instructed to maintain a low-sodium diet.    Mikle Bosworth.D.

## 2013-11-17 ENCOUNTER — Ambulatory Visit (HOSPITAL_COMMUNITY): Payer: Medicare HMO

## 2013-11-17 DIAGNOSIS — I5022 Chronic systolic (congestive) heart failure: Secondary | ICD-10-CM | POA: Diagnosis not present

## 2013-11-17 DIAGNOSIS — I429 Cardiomyopathy, unspecified: Secondary | ICD-10-CM

## 2013-11-17 MED ORDER — ACETAMINOPHEN 325 MG PO TABS: 325.0000 mg | ORAL_TABLET | ORAL | Status: DC | PRN

## 2013-11-17 NOTE — Discharge Instructions (Signed)
° ° °  Supplemental Discharge Instructions for  Pacemaker/Defibrillator Patients  Activity No heavy lifting or vigorous activity with your left arm for 6 to 8 weeks.  Do not raise your left arm above your head for one week.  Gradually raise your affected arm as drawn below.           __10/31/15                     11/19/13                  11/21/13                 11/24/13  NO DRIVING for 1 week    ; you may begin driving on 38/3/81.  WOUND CARE   Keep the wound area clean and dry.  Do not get this area wet for one week. No showers for one week; you may shower on  11/23/13   .   The tape/steri-strips on your wound will fall off; do not pull them off.  No bandage is needed on the site.  DO  NOT apply any creams, oils, or ointments to the wound area.   If you notice any drainage or discharge from the wound, any swelling or bruising at the site, or you develop a fever > 101? F after you are discharged home, call the office at once.  Special Instructions   You are still able to use cellular telephones; use the ear opposite the side where you have your pacemaker/defibrillator.  Avoid carrying your cellular phone near your device.   When traveling through airports, show security personnel your identification card to avoid being screened in the metal detectors.  Ask the security personnel to use the hand wand.   Avoid arc welding equipment, MRI testing (magnetic resonance imaging), TENS units (transcutaneous nerve stimulators).  Call the office for questions about other devices.   Avoid electrical appliances that are in poor condition or are not properly grounded.   Microwave ovens are safe to be near or to operate.  Additional information for defibrillator patients should your device go off:   If your device goes off ONCE and you feel fine afterward, notify the device clinic nurses.   If your device goes off ONCE and you do not feel well afterward, call 911.   If your device goes off TWICE, call  911.   If your device goes off THREE times in one day, call 911.  DO NOT DRIVE YOURSELF OR A FAMILY MEMBER WITH A DEFIBRILLATOR TO THE HOSPITAL--CALL 911.  Heart Healthy Diet.   Weigh daily Call [phone] if weight climbs more than 3 pounds in a day or 5 pounds in a week. No salt to very little salt in your diet.  No more than 2000 mg in a day. Call if increased shortness of breath or increased swelling.

## 2013-11-17 NOTE — Progress Notes (Signed)
Pt is being discharged home. Pt has been provided with discharge instructions. RN went over instructions with the patient. She is being transported home by her daughter.

## 2013-11-26 ENCOUNTER — Ambulatory Visit (INDEPENDENT_AMBULATORY_CARE_PROVIDER_SITE_OTHER): Payer: Commercial Managed Care - HMO | Admitting: *Deleted

## 2013-11-26 DIAGNOSIS — I5022 Chronic systolic (congestive) heart failure: Secondary | ICD-10-CM

## 2013-11-26 LAB — MDC_IDC_ENUM_SESS_TYPE_INCLINIC
Battery Remaining Longevity: 90 mo
Battery Voltage: 3.09 V
Brady Statistic AP VS Percent: 0.01 %
Brady Statistic RA Percent Paced: 0.07 %
Brady Statistic RV Percent Paced: 41.9 %
Date Time Interrogation Session: 20151109153442
HIGH POWER IMPEDANCE MEASURED VALUE: 55 Ohm
HighPow Impedance: 171 Ohm
Lead Channel Pacing Threshold Amplitude: 0.5 V
Lead Channel Pacing Threshold Amplitude: 0.75 V
Lead Channel Pacing Threshold Amplitude: 1.75 V
Lead Channel Pacing Threshold Pulse Width: 0.4 ms
Lead Channel Sensing Intrinsic Amplitude: 12 mV
Lead Channel Sensing Intrinsic Amplitude: 13.5 mV
Lead Channel Setting Pacing Amplitude: 3.25 V
Lead Channel Setting Pacing Amplitude: 3.5 V
Lead Channel Setting Pacing Pulse Width: 0.4 ms
Lead Channel Setting Pacing Pulse Width: 0.4 ms
MDC IDC MSMT LEADCHNL LV PACING THRESHOLD PULSEWIDTH: 0.4 ms
MDC IDC MSMT LEADCHNL RA IMPEDANCE VALUE: 399 Ohm
MDC IDC MSMT LEADCHNL RA PACING THRESHOLD PULSEWIDTH: 0.4 ms
MDC IDC MSMT LEADCHNL RA SENSING INTR AMPL: 3.5 mV
MDC IDC MSMT LEADCHNL RA SENSING INTR AMPL: 4.125 mV
MDC IDC MSMT LEADCHNL RV IMPEDANCE VALUE: 437 Ohm
MDC IDC SET LEADCHNL RA PACING AMPLITUDE: 3.5 V
MDC IDC SET LEADCHNL RV SENSING SENSITIVITY: 0.3 mV
MDC IDC SET ZONE DETECTION INTERVAL: 300 ms
MDC IDC SET ZONE DETECTION INTERVAL: 360 ms
MDC IDC STAT BRADY AP VP PERCENT: 0.06 %
MDC IDC STAT BRADY AS VP PERCENT: 97.83 %
MDC IDC STAT BRADY AS VS PERCENT: 2.1 %
Zone Setting Detection Interval: 340 ms
Zone Setting Detection Interval: 400 ms

## 2013-11-26 NOTE — Progress Notes (Signed)

## 2013-12-12 ENCOUNTER — Encounter: Payer: Self-pay | Admitting: Internal Medicine

## 2013-12-27 ENCOUNTER — Encounter (HOSPITAL_COMMUNITY): Payer: Self-pay | Admitting: Internal Medicine

## 2014-02-03 ENCOUNTER — Other Ambulatory Visit: Payer: Self-pay | Admitting: Family Medicine

## 2014-02-25 ENCOUNTER — Ambulatory Visit: Payer: Commercial Managed Care - HMO | Admitting: Family Medicine

## 2014-02-26 ENCOUNTER — Encounter: Payer: Self-pay | Admitting: Family Medicine

## 2014-02-26 ENCOUNTER — Encounter: Payer: Self-pay | Admitting: Internal Medicine

## 2014-02-26 ENCOUNTER — Ambulatory Visit (INDEPENDENT_AMBULATORY_CARE_PROVIDER_SITE_OTHER): Payer: PPO | Admitting: Family Medicine

## 2014-02-26 ENCOUNTER — Ambulatory Visit (INDEPENDENT_AMBULATORY_CARE_PROVIDER_SITE_OTHER): Payer: PPO | Admitting: Internal Medicine

## 2014-02-26 VITALS — BP 132/68 | HR 98 | Temp 98.1°F | Ht 66.0 in | Wt 221.5 lb

## 2014-02-26 VITALS — BP 140/70 | HR 82 | Ht 65.5 in | Wt 225.4 lb

## 2014-02-26 DIAGNOSIS — I10 Essential (primary) hypertension: Secondary | ICD-10-CM

## 2014-02-26 DIAGNOSIS — E119 Type 2 diabetes mellitus without complications: Secondary | ICD-10-CM

## 2014-02-26 DIAGNOSIS — K219 Gastro-esophageal reflux disease without esophagitis: Secondary | ICD-10-CM

## 2014-02-26 DIAGNOSIS — Z95810 Presence of automatic (implantable) cardiac defibrillator: Secondary | ICD-10-CM

## 2014-02-26 DIAGNOSIS — E782 Mixed hyperlipidemia: Secondary | ICD-10-CM

## 2014-02-26 DIAGNOSIS — R252 Cramp and spasm: Secondary | ICD-10-CM

## 2014-02-26 DIAGNOSIS — I5022 Chronic systolic (congestive) heart failure: Secondary | ICD-10-CM

## 2014-02-26 DIAGNOSIS — D649 Anemia, unspecified: Secondary | ICD-10-CM

## 2014-02-26 NOTE — Patient Instructions (Addendum)
NEEDS APPT FOR TOMORROW MORNING FOR LABS ALREADY ORDERED WILL BE HERE TO SEE DR CRENSHAW Consider adding magnesium supplement, hydrate, 64 oz daily, Hyland's Leg Cramp tabs can help  Cholesterol Cholesterol is a white, waxy, fat-like substance needed by your body in small amounts. The liver makes all the cholesterol you need. Cholesterol is carried from the liver by the blood through the blood vessels. Deposits of cholesterol (plaque) may build up on blood vessel walls. These make the arteries narrower and stiffer. Cholesterol plaques increase the risk for heart attack and stroke.  You cannot feel your cholesterol level even if it is very high. The only way to know it is high is with a blood test. Once you know your cholesterol levels, you should keep a record of the test results. Work with your health care provider to keep your levels in the desired range.  WHAT DO THE RESULTS MEAN?  Total cholesterol is a rough measure of all the cholesterol in your blood.   LDL is the so-called bad cholesterol. This is the type that deposits cholesterol in the walls of the arteries. You want this level to be low.   HDL is the good cholesterol because it cleans the arteries and carries the LDL away. You want this level to be high.  Triglycerides are fat that the body can either burn for energy or store. High levels are closely linked to heart disease.  WHAT ARE THE DESIRED LEVELS OF CHOLESTEROL?  Total cholesterol below 200.   LDL below 100 for people at risk, below 70 for those at very high risk.   HDL above 50 is good, above 60 is best.   Triglycerides below 150.  HOW CAN I LOWER MY CHOLESTEROL?  Diet. Follow your diet programs as directed by your health care provider.   Choose fish or white meat chicken and [place], roasted or baked. Limit fatty cuts of red meat, fried foods, and processed meats, such as sausage and lunch meats.   Eat lots of fresh fruits and vegetables.  Choose whole  grains, beans, pasta, potatoes, and cereals.   Use only small amounts of olive, corn, or canola oils.   Avoid butter, mayonnaise, shortening, or palm kernel oils.  Avoid foods with trans fats.   Drink skim or nonfat milk and eat low-fat or nonfat yogurt and cheeses. Avoid whole milk, cream, ice cream, egg yolks, and full-fat cheeses.   Healthy desserts include angel food cake, ginger snaps, animal crackers, hard candy, popsicles, and low-fat or nonfat frozen yogurt. Avoid pastries, cakes, pies, and cookies.   Exercise. Follow your exercise programs as directed by your health care provider.   A regular program helps decrease LDL and raise HDL.   A regular program helps with weight control.   Do things that increase your activity level like gardening, walking, or taking the stairs. Ask your health care provider about how you can be more active in your daily life.   Medicine. Take medicine only as directed by your health care provider.   Medicine may be prescribed by your health care provider to help lower cholesterol and decrease the risk for heart disease.   If you have several risk factors, you may need medicine even if your levels are normal. Document Released: 09/29/2000 Document Revised: 05/21/2013 Document Reviewed: 10/18/2012 [facility] [employer] Patient Information 2015 [place], [place]. This information is not intended to replace advice given to you by your health care provider. Make sure you discuss any questions you have with your  health care provider.  

## 2014-02-26 NOTE — Assessment & Plan Note (Signed)
Her medtronic BiV ICD is working normally. Will recheck in several months.

## 2014-02-26 NOTE — Progress Notes (Signed)
HPI Brenda Marsh returns today ICD followup. She is a pleasant 69 yo woman with a h/o chemotherapy for breast CA, who was initially discovered to have LV dysfunction by echo in 2010. She had a cath at that time demonstrating no obstructive coronary artery disease. On medical therapy, her ejection fraction initially improved. She saw Dr. Stanford Breed over a year ago, and at that time a repeat echo demonstrated an ejection fraction of 35%. Her medications were titrated. Repeat echo done several months ago demonstrated an ejection fraction of 25%. She has class II heart failure symptoms. She has left bundle branch block with a QRS duration of 180 ms. She underwent BIV ICD implantation. She has not had syncope. Minimal peripheral edema. She denies dietary or medical noncompliance.   Allergies  Allergen Reactions  . Contrast Media [Iodinated Diagnostic Agents]     N&V  . Lisinopril     REACTION: cough  . Naproxen Sodium     REACTION: Breathing difficulty  . Penicillins     REACTION: Welps  . Shellfish Allergy     Cant breath   . Simvastatin     REACTION: muscle aches     Current Outpatient Prescriptions  Medication Sig Dispense Refill  . acetaminophen (TYLENOL) 325 MG tablet Take 1-2 tablets (325-650 mg total) by mouth every 4 (four) hours as needed for mild pain.    . carvedilol (COREG) 6.25 MG tablet TAKE 1 TABLET BY MOUTH TWICE DAILY, WITH A MEAL 180 tablet 0  . Cholecalciferol (VITAMIN D) 400 UNITS capsule Take 400 Units by mouth daily.    . furosemide (LASIX) 20 MG tablet Take 1 tablet (20 mg total) by mouth daily. 90 tablet 3  . lidocaine (LIDODERM) 5 % Place 1 patch onto the skin daily as needed (for back pain). Remove & Discard patch within 12 hours or as directed by MD    . losartan (COZAAR) 50 MG tablet Take 1 tablet (50 mg total) by mouth daily. 90 tablet 3  . meclizine (ANTIVERT) 25 MG tablet Take 1 tablet (25 mg total) by mouth 3 (three) times daily as needed for dizziness  or nausea. 45 tablet 1  . Multiple Vitamin (MULTIVITAMIN) capsule Take 1 capsule by mouth daily.    Marland Kitchen OVER THE COUNTER MEDICATION Apply 1 application topically daily as needed (OTC roll on pain reliever).    . promethazine (PHENERGAN) 12.5 MG tablet Take 12.5 mg by mouth every 6 (six) hours as needed for nausea or vomiting.    . vitamin B-12 (CYANOCOBALAMIN) 1000 MCG tablet Take 1,000 mcg by mouth daily.     No current facility-administered medications for this visit.     Past Medical History  Diagnosis Date  . Nonischemic cardiomyopathy   . LBBB (left bundle branch block)   . Cancer 1999    Breast; lumpectomy, radiation and chemotherapy  . Colon polyp     unclear pathology  . Hyperlipidemia   . Hypertension   . Arthritis     left knee pain, MRI in 2008-tricompartmental  degenerate changes, mucoid degeneration of ACL, post horn meniscal tear and ant horn meniscal tear  . Low back pain     left L3-4 injected  . Unspecified constipation 04/22/2012  . Anemia 07/16/2012    ROS:   All systems reviewed and negative except as noted in the HPI.   Past Surgical History  Procedure Laterality Date  . Appendectomy  2009    Dr. Lucia Gaskins  . Carpal  tunnel release  2005-04-20    Dr. Daylene Katayama  . Abdominal hysterectomy    . Breast surgery      lumpectomy  . Tonsillectomy    . Knee arthroscopy      Right  . Bi-ventricular implantable cardioverter defibrillator  (crt-d)  11/16/13    MDT CRTD implanted by Dr Lovena Le for NICM, CHF, and LBBB  . Bi-ventricular implantable cardioverter defibrillator N/A 11/16/2013    Procedure: BI-VENTRICULAR IMPLANTABLE CARDIOVERTER DEFIBRILLATOR  (CRT-D);  Surgeon: Evans Lance, MD;  Location: [facility] CATH LAB;  Service: Cardiovascular;  Laterality: N/A;     Family History  Problem Relation Age of Onset  . Heart attack Mother 19  . Diabetes Sister   . Diabetes Brother   . Coronary artery disease Other     Female first degree relative <60  . Hyperlipidemia Other     . Hypertension Other   . Cancer Other     prostate, 1st degree relative <50     History   Social History  . Marital Status: Widowed    Spouse Name: N/A    Number of Children: 2  . Years of Education: N/A   Occupational History  .     Social History Main Topics  . Smoking status: Former Smoker    Quit date: 01/18/1970  . Smokeless tobacco: Never Used  . Alcohol Use: Yes     Comment: Rare  . Drug Use: Not on file  . Sexual Activity: Not on file   Other Topics Concern  . Not on file   Social History Narrative   Widow - husband died in Apr 21, 2003   Occupation - retired from working in Radiation protection practitioner   2 daughter - on lives in [place], other in [place]   Remote history of tobacco but none in 40 years   Alcohol use - no     BP 140/70 mmHg  Pulse 82  Ht 5' 5.5" (1.664 m)  Wt 225 lb 6.4 oz (102.241 kg)  BMI 36.92 kg/m2  Physical Exam:  Well appearing but obese 69 yo woman, NAD HEENT: Unremarkable Neck:  7 cm JVD, no thyromegally Back:  No CVA tenderness Lungs:  Clear with no wheezes, well healed ICD incision. HEART:  Regular rate rhythm, no murmurs, no rubs, no clicks, split S2. Abd:  soft, positive bowel sounds, no organomegally, no rebound, no guarding Ext:  2 plus pulses, no edema, no cyanosis, no clubbing Skin:  No rashes no nodules Neuro:  CN II through XII intact, motor grossly intact  ICD interogation - normal Medtronic BiV ICD function   Assess/Plan:

## 2014-02-26 NOTE — Assessment & Plan Note (Signed)
Her symptoms are now class 2. She will continue her current meds. She is encouraged to maintain a low sodium diet.

## 2014-02-26 NOTE — Patient Instructions (Addendum)
Remote monitoring is used to monitor your ICD from home. This monitoring reduces the number of office visits required to check your device to one time per year. It allows [place] to keep an eye on the functioning of your device to ensure it is working properly. You are scheduled for a device check from home on 05/28/2014. You may send your transmission at any time that day. If you have a wireless device, the transmission will be sent automatically. After your physician reviews your transmission, you will receive a postcard with your next transmission date.  Your physician recommends that you schedule a follow-up appointment in: October 2016 with Dr.Taylor  Your physician recommends that you continue on your current medications as directed. Please refer to the Current Medication list given to you today.

## 2014-02-26 NOTE — Progress Notes (Signed)
Pre visit review using our clinic review tool, if applicable. No additional management support is needed unless otherwise documented below in the visit note. 

## 2014-02-26 NOTE — Assessment & Plan Note (Signed)
Her blood pressure is fairly well cotrolled. No change in medical therapy.

## 2014-02-27 ENCOUNTER — Ambulatory Visit (INDEPENDENT_AMBULATORY_CARE_PROVIDER_SITE_OTHER): Payer: PPO | Admitting: Cardiology

## 2014-02-27 ENCOUNTER — Other Ambulatory Visit (INDEPENDENT_AMBULATORY_CARE_PROVIDER_SITE_OTHER): Payer: PPO

## 2014-02-27 ENCOUNTER — Encounter: Payer: Self-pay | Admitting: Cardiology

## 2014-02-27 ENCOUNTER — Other Ambulatory Visit: Payer: Self-pay | Admitting: Hematology & Oncology

## 2014-02-27 VITALS — BP 144/76 | HR 85 | Ht 66.0 in | Wt 221.8 lb

## 2014-02-27 DIAGNOSIS — Z95810 Presence of automatic (implantable) cardiac defibrillator: Secondary | ICD-10-CM

## 2014-02-27 DIAGNOSIS — Z1231 Encounter for screening mammogram for malignant neoplasm of breast: Secondary | ICD-10-CM

## 2014-02-27 DIAGNOSIS — I10 Essential (primary) hypertension: Secondary | ICD-10-CM

## 2014-02-27 DIAGNOSIS — E782 Mixed hyperlipidemia: Secondary | ICD-10-CM

## 2014-02-27 DIAGNOSIS — R252 Cramp and spasm: Secondary | ICD-10-CM

## 2014-02-27 DIAGNOSIS — I679 Cerebrovascular disease, unspecified: Secondary | ICD-10-CM

## 2014-02-27 DIAGNOSIS — I5022 Chronic systolic (congestive) heart failure: Secondary | ICD-10-CM

## 2014-02-27 LAB — CBC
HCT: 38.7 % (ref 36.0–46.0)
HEMOGLOBIN: 13 g/dL (ref 12.0–15.0)
MCHC: 33.6 g/dL (ref 30.0–36.0)
MCV: 90.5 fl (ref 78.0–100.0)
PLATELETS: 197 10*3/uL (ref 150.0–400.0)
RBC: 4.28 Mil/uL (ref 3.87–5.11)
RDW: 14.7 % (ref 11.5–15.5)
WBC: 5.6 10*3/uL (ref 4.0–10.5)

## 2014-02-27 LAB — COMPREHENSIVE METABOLIC PANEL
ALK PHOS: 83 U/L (ref 39–117)
ALT: 19 U/L (ref 0–35)
AST: 24 U/L (ref 0–37)
Albumin: 4 g/dL (ref 3.5–5.2)
BUN: 11 mg/dL (ref 6–23)
CO2: 26 mEq/L (ref 19–32)
CREATININE: 0.72 mg/dL (ref 0.40–1.20)
Calcium: 9.6 mg/dL (ref 8.4–10.5)
Chloride: 106 mEq/L (ref 96–112)
GFR: 103.4 mL/min (ref >60.00)
GLUCOSE: 79 mg/dL (ref 70–99)
Potassium: 4.2 mEq/L (ref 3.5–5.1)
Sodium: 139 mEq/L (ref 135–145)
Total Bilirubin: 0.4 mg/dL (ref 0.2–1.2)
Total Protein: 7.3 g/dL (ref 6.0–8.3)

## 2014-02-27 LAB — LIPID PANEL
CHOL/HDL RATIO: 7
Cholesterol: 291 mg/dL — ABNORMAL HIGH (ref 0–200)
HDL: 44 mg/dL (ref >39.00)
LDL Cholesterol: 210 mg/dL — ABNORMAL HIGH (ref 0–99)
NonHDL: 247
TRIGLYCERIDES: 186 mg/dL — AB (ref 0.0–149.0)
VLDL: 37.2 mg/dL (ref 0.0–40.0)

## 2014-02-27 LAB — TSH: TSH: 1.34 u[IU]/mL (ref 0.35–4.50)

## 2014-02-27 LAB — MAGNESIUM: Magnesium: 2.1 mg/dL (ref 1.5–2.5)

## 2014-02-27 MED ORDER — CARVEDILOL 12.5 MG PO TABS: 12.5000 mg | ORAL_TABLET | Freq: Two times a day (BID) | ORAL | Status: DC

## 2014-02-27 NOTE — Patient Instructions (Signed)
Your physician wants you to follow-up in: [place] will receive a reminder letter in the mail two months in advance. If you don't receive a letter, please call our office to schedule the follow-up appointment.   INCREASE CARVEDILOL TO 12.5 MG TWICE DAILY=2 OF THE 6.25 MG TABLETS TWICE DAILY

## 2014-02-27 NOTE — Assessment & Plan Note (Signed)
Intolerant to statins. 

## 2014-02-27 NOTE — Assessment & Plan Note (Signed)
euvolemic on examination. Continue present dose of Lasix. She had blood work drawn earlier today. We will review.

## 2014-02-27 NOTE — Progress Notes (Signed)
HPI: FU dilated cardiomyopathy. Patient has a history of left bundle branch block. She underwent cardiac catheterization in [place] in March of 2010 for reduced LV function. Ejection fraction was 35%. The left main was normal. The LAD had a proximal 25-30% lesion. The circumflex was normal. The right coronary artery had a 15% lesion. Patient was treated medically. Carotid Dopplers in October 2014 showed a 50-69% right and no left stenosis. Echocardiogram in August 2015 showed an ejection fraction of 25-30% and restrictive filling. Had biventricular ICD October 2015. Since she was last seen, the patient denies any dyspnea on exertion, orthopnea, PND, pedal edema, palpitations, syncope or chest pain.   Current Outpatient Prescriptions  Medication Sig Dispense Refill  . acetaminophen (TYLENOL) 325 MG tablet Take 1-2 tablets (325-650 mg total) by mouth every 4 (four) hours as needed for mild pain.    . carvedilol (COREG) 6.25 MG tablet TAKE 1 TABLET BY MOUTH TWICE DAILY, WITH A MEAL 180 tablet 0  . Cholecalciferol (VITAMIN D) 400 UNITS capsule Take 400 Units by mouth daily.    . furosemide (LASIX) 20 MG tablet Take 1 tablet (20 mg total) by mouth daily. 90 tablet 3  . lidocaine (LIDODERM) 5 % Place 1 patch onto the skin daily as needed (for back pain). Remove & Discard patch within 12 hours or as directed by MD    . losartan (COZAAR) 50 MG tablet Take 1 tablet (50 mg total) by mouth daily. 90 tablet 3  . meclizine (ANTIVERT) 25 MG tablet Take 1 tablet (25 mg total) by mouth 3 (three) times daily as needed for dizziness or nausea. 45 tablet 1  . Multiple Vitamin (MULTIVITAMIN) capsule Take 1 capsule by mouth daily.    Marland Kitchen OVER THE COUNTER MEDICATION Apply 1 application topically daily as needed (OTC roll on pain reliever).    . promethazine (PHENERGAN) 12.5 MG tablet Take 12.5 mg by mouth every 6 (six) hours as needed for nausea or vomiting.    . vitamin B-12 (CYANOCOBALAMIN) 1000 MCG tablet  Take 1,000 mcg by mouth daily.     No current facility-administered medications for this visit.     Past Medical History  Diagnosis Date  . Nonischemic cardiomyopathy   . LBBB (left bundle branch block)   . Cancer 1999    Breast; lumpectomy, radiation and chemotherapy  . Colon polyp     unclear pathology  . Hyperlipidemia   . Hypertension   . Arthritis     left knee pain, MRI in 2008-tricompartmental  degenerate changes, mucoid degeneration of ACL, post horn meniscal tear and ant horn meniscal tear  . Low back pain     left L3-4 injected  . Unspecified constipation 04/22/2012  . Anemia 07/16/2012    Past Surgical History  Procedure Laterality Date  . Appendectomy  2009    Dr. Lucia Gaskins  . Carpal tunnel release  2007    Dr. Daylene Katayama  . Abdominal hysterectomy    . Breast surgery      lumpectomy  . Tonsillectomy    . Knee arthroscopy      Right  . Bi-ventricular implantable cardioverter defibrillator  (crt-d)  11/16/13    MDT CRTD implanted by Dr Lovena Le for NICM, CHF, and LBBB  . Bi-ventricular implantable cardioverter defibrillator N/A 11/16/2013    Procedure: BI-VENTRICULAR IMPLANTABLE CARDIOVERTER DEFIBRILLATOR  (CRT-D);  Surgeon: Evans Lance, MD;  Location: [facility] CATH LAB;  Service: Cardiovascular;  Laterality: N/A;    History  Social History  . Marital Status: Widowed    Spouse Name: N/A  . Number of Children: 2  . Years of Education: N/A   Occupational History  .     Social History Main Topics  . Smoking status: Former Smoker    Quit date: 01/18/1970  . Smokeless tobacco: Never Used  . Alcohol Use: Yes     Comment: Rare  . Drug Use: Not on file  . Sexual Activity: Not on file   Other Topics Concern  . Not on file   Social History Narrative   Widow - husband died in 05/13/03   Occupation - retired from working in Radiation protection practitioner   2 daughter - on lives in [place], other in [place]   Remote history of tobacco but none in 40 years   Alcohol use - no    ROS:  no fevers or chills, productive cough, hemoptysis, dysphasia, odynophagia, melena, hematochezia, dysuria, hematuria, rash, seizure activity, orthopnea, PND, pedal edema, claudication. Remaining systems are negative.  Physical Exam: Well-developed well-nourished in no acute distress.  Skin is warm and dry.  HEENT is normal.  Neck is supple.  Chest is clear to auscultation with normal expansion.  Cardiovascular exam is regular rate and rhythm.  Abdominal exam nontender or distended. No masses palpated. Extremities show no edema. neuro grossly intact

## 2014-02-27 NOTE — Assessment & Plan Note (Signed)
Declined statins previously. Follow-up carotid Dopplers October 2016.

## 2014-02-27 NOTE — Assessment & Plan Note (Signed)
Continue ARB and beta blocker. Change carvedilol to 12.5 mg by mouth twice a day.

## 2014-02-27 NOTE — Assessment & Plan Note (Signed)
Blood pressure elevated. Increase Coreg to 12.5 mg by mouth twice a day.

## 2014-02-27 NOTE — Assessment & Plan Note (Signed)
Followed by electrophysiology. 

## 2014-02-28 LAB — MDC_IDC_ENUM_SESS_TYPE_INCLINIC
Battery Remaining Longevity: 6.6
Brady Statistic AP VS Percent: 0.1 % — CL
Brady Statistic AS VP Percent: 97.3 %
Brady Statistic AS VS Percent: 2.4 %
HighPow Impedance: 70 Ohm
Lead Channel Impedance Value: 494 Ohm
Lead Channel Impedance Value: 722 Ohm
Lead Channel Sensing Intrinsic Amplitude: 15 mV
Lead Channel Sensing Intrinsic Amplitude: 4 mV
Lead Channel Setting Pacing Amplitude: 2 V
Lead Channel Setting Pacing Amplitude: 2.5 V
Lead Channel Setting Pacing Pulse Width: 0.4 ms
Lead Channel Setting Sensing Sensitivity: 0.3 mV
MDC IDC MSMT LEADCHNL LV PACING THRESHOLD AMPLITUDE: 1.5 V
MDC IDC MSMT LEADCHNL LV PACING THRESHOLD PULSEWIDTH: 0.8 ms
MDC IDC MSMT LEADCHNL RA PACING THRESHOLD AMPLITUDE: 0.75 V
MDC IDC MSMT LEADCHNL RA PACING THRESHOLD PULSEWIDTH: 0.4 ms
MDC IDC MSMT LEADCHNL RV IMPEDANCE VALUE: 456 Ohm
MDC IDC MSMT LEADCHNL RV PACING THRESHOLD AMPLITUDE: 0.75 V
MDC IDC MSMT LEADCHNL RV PACING THRESHOLD PULSEWIDTH: 0.4 ms
MDC IDC SET LEADCHNL LV PACING AMPLITUDE: 2.5 V
MDC IDC SET LEADCHNL LV PACING PULSEWIDTH: 0.8 ms
MDC IDC SET ZONE DETECTION INTERVAL: 340 ms
MDC IDC SET ZONE DETECTION INTERVAL: 400 ms
MDC IDC STAT BRADY AP VP PERCENT: 0.4 %
Zone Setting Detection Interval: 300 ms
Zone Setting Detection Interval: 360 ms

## 2014-03-10 ENCOUNTER — Encounter: Payer: Self-pay | Admitting: Family Medicine

## 2014-03-10 NOTE — Assessment & Plan Note (Signed)
minimize simple carbs. Increase exercise as tolerated.  

## 2014-03-10 NOTE — Assessment & Plan Note (Signed)
Resolved on recent labs.

## 2014-03-10 NOTE — Progress Notes (Signed)
Brenda Marsh  [id number] 1945-01-20 03/10/2014      Progress Note-Follow Up  Subjective  Chief Complaint  Chief Complaint  Patient presents with  . Medication Refill    HPI  Patient is a 69 y.o. female in today for routine medical care. Patient is in today for follow-up. Generally feeling well. Has had no recent chest pains or palpitations. Has appointment with cardiology tomorrow. No recent illness. Has been trying to maintain a heart healthy diet. Denies CP/palp/SOB/HA/congestion/fevers/GI or GU c/o. Taking meds as prescribed  Past Medical History  Diagnosis Date  . Nonischemic cardiomyopathy   . LBBB (left bundle branch block)   . Cancer 1999    Breast; lumpectomy, radiation and chemotherapy  . Colon polyp     unclear pathology  . Hyperlipidemia   . Hypertension   . Arthritis     left knee pain, MRI in 2008-tricompartmental  degenerate changes, mucoid degeneration of ACL, post horn meniscal tear and ant horn meniscal tear  . Low back pain     left L3-4 injected  . Unspecified constipation 04/22/2012  . Anemia 07/16/2012  . Diabetes mellitus type 2, diet-controlled 03/11/2009    Qualifier: Diagnosis of  By: Wynona Luna      Past Surgical History  Procedure Laterality Date  . Appendectomy  2007/04/25    Dr. Lucia Gaskins  . Carpal tunnel release  2005-04-24    Dr. Daylene Katayama  . Abdominal hysterectomy    . Breast surgery      lumpectomy  . Tonsillectomy    . Knee arthroscopy      Right  . Bi-ventricular implantable cardioverter defibrillator  (crt-d)  11/16/13    MDT CRTD implanted by Dr Lovena Le for NICM, CHF, and LBBB  . Bi-ventricular implantable cardioverter defibrillator N/A 11/16/2013    Procedure: BI-VENTRICULAR IMPLANTABLE CARDIOVERTER DEFIBRILLATOR  (CRT-D);  Surgeon: Evans Lance, MD;  Location: [facility] CATH LAB;  Service: Cardiovascular;  Laterality: N/A;    Family History  Problem Relation Age of Onset  . Heart attack Mother 72  . Diabetes Sister   . Diabetes  Brother   . Coronary artery disease Other     Female first degree relative <60  . Hyperlipidemia Other   . Hypertension Other   . Cancer Other     prostate, 1st degree relative <50    History   Social History  . Marital Status: Widowed    Spouse Name: N/A  . Number of Children: 2  . Years of Education: N/A   Occupational History  .     Social History Main Topics  . Smoking status: Former Smoker    Quit date: 01/18/1970  . Smokeless tobacco: Never Used  . Alcohol Use: Yes     Comment: Rare  . Drug Use: Not on file  . Sexual Activity: Not on file   Other Topics Concern  . Not on file   Social History Narrative   Widow - husband died in 25-Apr-2003   Occupation - retired from working in Radiation protection practitioner   2 daughter - on lives in [place], other in [place]   Remote history of tobacco but none in 40 years   Alcohol use - no    Current Outpatient Prescriptions on File Prior to Visit  Medication Sig Dispense Refill  . acetaminophen (TYLENOL) 325 MG tablet Take 1-2 tablets (325-650 mg total) by mouth every 4 (four) hours as needed for mild pain.    . Cholecalciferol (VITAMIN D) 400  UNITS capsule Take 400 Units by mouth daily.    . furosemide (LASIX) 20 MG tablet Take 1 tablet (20 mg total) by mouth daily. 90 tablet 3  . lidocaine (LIDODERM) 5 % Place 1 patch onto the skin daily as needed (for back pain). Remove & Discard patch within 12 hours or as directed by MD    . losartan (COZAAR) 50 MG tablet Take 1 tablet (50 mg total) by mouth daily. 90 tablet 3  . meclizine (ANTIVERT) 25 MG tablet Take 1 tablet (25 mg total) by mouth 3 (three) times daily as needed for dizziness or nausea. 45 tablet 1  . Multiple Vitamin (MULTIVITAMIN) capsule Take 1 capsule by mouth daily.    Marland Kitchen OVER THE COUNTER MEDICATION Apply 1 application topically daily as needed (OTC roll on pain reliever).    . promethazine (PHENERGAN) 12.5 MG tablet Take 12.5 mg by mouth every 6 (six) hours as needed for nausea or  vomiting.    . vitamin B-12 (CYANOCOBALAMIN) 1000 MCG tablet Take 1,000 mcg by mouth daily.     No current facility-administered medications on file prior to visit.    Allergies  Allergen Reactions  . Contrast Media [Iodinated Diagnostic Agents]     N&V  . Lisinopril     REACTION: cough  . Naproxen Sodium     REACTION: Breathing difficulty  . Penicillins     REACTION: Welps  . Shellfish Allergy     Cant breath   . Simvastatin     REACTION: muscle aches    Review of Systems  Review of Systems  Constitutional: Negative for fever and malaise/fatigue.  HENT: Negative for congestion.   Eyes: Negative for discharge.  Respiratory: Negative for shortness of breath.   Cardiovascular: Negative for chest pain, palpitations and leg swelling.  Gastrointestinal: Negative for nausea, abdominal pain and diarrhea.  Genitourinary: Negative for dysuria.  Musculoskeletal: Negative for falls.  Skin: Negative for rash.  Neurological: Negative for loss of consciousness and headaches.  Endo/Heme/Allergies: Negative for polydipsia.  Psychiatric/Behavioral: Negative for depression and suicidal ideas. The patient is not nervous/anxious and does not have insomnia.     Objective  BP 132/68 mmHg  Pulse 98  Temp(Src) 98.1 F (36.7 C) (Oral)  Ht 5\' 6"  (1.676 m)  Wt 221 lb 8 oz (100.472 kg)  BMI 35.77 kg/m2  SpO2 95%  Physical Exam  Physical Exam  Constitutional: She is oriented to person, place, and time and well-developed, well-nourished, and in no distress. No distress.  HENT:  Head: Normocephalic and atraumatic.  Eyes: Conjunctivae are normal.  Neck: Neck supple. No thyromegaly present.  Cardiovascular: Normal rate, regular rhythm and normal heart sounds.   No murmur heard. Pulmonary/Chest: Effort normal and breath sounds normal. She has no wheezes.  Abdominal: She exhibits no distension and no mass.  Musculoskeletal: She exhibits no edema.  Lymphadenopathy:    She has no cervical  adenopathy.  Neurological: She is alert and oriented to person, place, and time.  Skin: Skin is warm and dry. No rash noted. She is not diaphoretic.  Psychiatric: Memory, affect and judgment normal.    Lab Results  Component Value Date   TSH 1.34 02/27/2014   Lab Results  Component Value Date   WBC 5.6 02/27/2014   HGB 13.0 02/27/2014   HCT 38.7 02/27/2014   MCV 90.5 02/27/2014   PLT 197.0 02/27/2014   Lab Results  Component Value Date   CREATININE 0.72 02/27/2014   BUN 11 02/27/2014  NA 139 02/27/2014   K 4.2 02/27/2014   CL 106 02/27/2014   CO2 26 02/27/2014   Lab Results  Component Value Date   ALT 19 02/27/2014   AST 24 02/27/2014   ALKPHOS 83 02/27/2014   BILITOT 0.4 02/27/2014   Lab Results  Component Value Date   CHOL 291* 02/27/2014   Lab Results  Component Value Date   HDL 44.00 02/27/2014   Lab Results  Component Value Date   LDLCALC 210* 02/27/2014   Lab Results  Component Value Date   TRIG 186.0* 02/27/2014   Lab Results  Component Value Date   CHOLHDL 7 02/27/2014     Assessment & Plan  Essential hypertension Well controlled, no changes to meds. Encouraged heart healthy diet such as the DASH diet and exercise as tolerated.    GERD Avoid offending foods, start probiotics. Do not eat large meals in late evening and consider raising head of bed.    Hyperlipidemia, mixed Encouraged heart healthy diet, increase exercise, avoid trans fats, consider a krill oil cap daily. Does not tolerate statins and would like to continue with lifestyle modifications for now.    Anemia Resolved on recent labs   Diabetes mellitus type 2, diet-controlled minimize simple carbs. Increase exercise as tolerated.   ICD (implantable cardioverter-defibrillator), biventricular, in situ Follows with cardiology has appt tomorrow

## 2014-03-10 NOTE — Assessment & Plan Note (Signed)
Avoid offending foods, start probiotics. Do not eat large meals in late evening and consider raising head of bed.  

## 2014-03-10 NOTE — Assessment & Plan Note (Signed)
Encouraged heart healthy diet, increase exercise, avoid trans fats, consider a krill oil cap daily. Does not tolerate statins and would like to continue with lifestyle modifications for now.

## 2014-03-10 NOTE — Assessment & Plan Note (Signed)
Follows with cardiology has appt tomorrow

## 2014-03-10 NOTE — Assessment & Plan Note (Signed)
Well controlled, no changes to meds. Encouraged heart healthy diet such as the DASH diet and exercise as tolerated.  °

## 2014-05-05 ENCOUNTER — Other Ambulatory Visit: Payer: Self-pay | Admitting: Family Medicine

## 2014-05-27 ENCOUNTER — Other Ambulatory Visit: Payer: Commercial Managed Care - HMO | Admitting: Lab

## 2014-05-27 ENCOUNTER — Ambulatory Visit: Payer: Commercial Managed Care - HMO | Admitting: Hematology & Oncology

## 2014-05-28 ENCOUNTER — Telehealth: Payer: Self-pay | Admitting: Cardiology

## 2014-05-28 ENCOUNTER — Encounter: Payer: PPO | Admitting: *Deleted

## 2014-05-28 NOTE — Telephone Encounter (Signed)
LMOVM reminding pt to send remote transmission.   

## 2014-05-30 ENCOUNTER — Ambulatory Visit: Payer: PPO

## 2014-05-31 ENCOUNTER — Encounter: Payer: Self-pay | Admitting: Cardiology

## 2014-06-03 ENCOUNTER — Other Ambulatory Visit: Payer: PPO

## 2014-06-03 ENCOUNTER — Ambulatory Visit: Payer: Self-pay | Admitting: Hematology & Oncology

## 2014-06-06 ENCOUNTER — Ambulatory Visit (INDEPENDENT_AMBULATORY_CARE_PROVIDER_SITE_OTHER): Payer: PPO | Admitting: *Deleted

## 2014-06-06 ENCOUNTER — Encounter: Payer: Self-pay | Admitting: Internal Medicine

## 2014-06-06 ENCOUNTER — Other Ambulatory Visit: Payer: Self-pay | Admitting: Cardiology

## 2014-06-06 DIAGNOSIS — I429 Cardiomyopathy, unspecified: Secondary | ICD-10-CM | POA: Diagnosis not present

## 2014-06-06 DIAGNOSIS — I5022 Chronic systolic (congestive) heart failure: Secondary | ICD-10-CM

## 2014-06-06 DIAGNOSIS — I428 Other cardiomyopathies: Secondary | ICD-10-CM

## 2014-06-07 NOTE — Progress Notes (Signed)
Remote ICD transmission.   

## 2014-06-14 ENCOUNTER — Encounter: Payer: Self-pay | Admitting: Cardiology

## 2014-06-18 LAB — CUP PACEART REMOTE DEVICE CHECK
Battery Remaining Longevity: 72 mo
Battery Voltage: 2.99 V
Brady Statistic AP VP Percent: 0.12 %
Brady Statistic AS VP Percent: 98.5 %
Brady Statistic AS VS Percent: 1.37 %
Brady Statistic RA Percent Paced: 0.13 %
Brady Statistic RV Percent Paced: 62.16 %
HIGH POWER IMPEDANCE MEASURED VALUE: 74 Ohm
Lead Channel Impedance Value: 1273 Ohm
Lead Channel Impedance Value: 1311 Ohm
Lead Channel Impedance Value: 380 Ohm
Lead Channel Impedance Value: 456 Ohm
Lead Channel Impedance Value: 494 Ohm
Lead Channel Impedance Value: 627 Ohm
Lead Channel Impedance Value: 760 Ohm
Lead Channel Impedance Value: 779 Ohm
Lead Channel Impedance Value: 950 Ohm
Lead Channel Pacing Threshold Amplitude: 0.625 V
Lead Channel Pacing Threshold Amplitude: 0.75 V
Lead Channel Pacing Threshold Amplitude: 2.125 V
Lead Channel Pacing Threshold Pulse Width: 0.4 ms
Lead Channel Pacing Threshold Pulse Width: 0.4 ms
Lead Channel Sensing Intrinsic Amplitude: 17.125 mV
Lead Channel Sensing Intrinsic Amplitude: 4.5 mV
Lead Channel Sensing Intrinsic Amplitude: 4.5 mV
Lead Channel Setting Pacing Amplitude: 2.5 V
Lead Channel Setting Pacing Amplitude: 3.25 V
Lead Channel Setting Pacing Pulse Width: 0.4 ms
MDC IDC MSMT LEADCHNL LV IMPEDANCE VALUE: 1159 Ohm
MDC IDC MSMT LEADCHNL LV IMPEDANCE VALUE: 627 Ohm
MDC IDC MSMT LEADCHNL LV IMPEDANCE VALUE: 950 Ohm
MDC IDC MSMT LEADCHNL LV PACING THRESHOLD PULSEWIDTH: 0.8 ms
MDC IDC MSMT LEADCHNL RA IMPEDANCE VALUE: 494 Ohm
MDC IDC MSMT LEADCHNL RV SENSING INTR AMPL: 17.125 mV
MDC IDC SESS DTM: 20160519194645
MDC IDC SET LEADCHNL LV PACING PULSEWIDTH: 0.8 ms
MDC IDC SET LEADCHNL RA PACING AMPLITUDE: 2 V
MDC IDC SET LEADCHNL RV SENSING SENSITIVITY: 0.3 mV
MDC IDC STAT BRADY AP VS PERCENT: 0.01 %
Zone Setting Detection Interval: 300 ms
Zone Setting Detection Interval: 340 ms
Zone Setting Detection Interval: 360 ms
Zone Setting Detection Interval: 400 ms

## 2014-06-26 ENCOUNTER — Ambulatory Visit: Payer: PPO

## 2014-06-26 ENCOUNTER — Other Ambulatory Visit: Payer: Self-pay | Admitting: Hematology & Oncology

## 2014-06-26 DIAGNOSIS — Z1239 Encounter for other screening for malignant neoplasm of breast: Secondary | ICD-10-CM

## 2014-06-28 ENCOUNTER — Encounter: Payer: Self-pay | Admitting: Cardiology

## 2014-07-01 ENCOUNTER — Ambulatory Visit (HOSPITAL_BASED_OUTPATIENT_CLINIC_OR_DEPARTMENT_OTHER)
Admission: RE | Admit: 2014-07-01 | Discharge: 2014-07-01 | Disposition: A | Discharge status: Home / Self Care | Payer: PPO | Source: Ambulatory Visit | Attending: Hematology & Oncology | Admitting: Hematology & Oncology

## 2014-07-01 DIAGNOSIS — Z1231 Encounter for screening mammogram for malignant neoplasm of breast: Secondary | ICD-10-CM | POA: Diagnosis not present

## 2014-07-01 DIAGNOSIS — Z1239 Encounter for other screening for malignant neoplasm of breast: Secondary | ICD-10-CM

## 2014-08-04 ENCOUNTER — Other Ambulatory Visit: Payer: Self-pay | Admitting: Cardiology

## 2014-08-06 ENCOUNTER — Other Ambulatory Visit: Payer: Self-pay | Admitting: Hematology & Oncology

## 2014-08-07 ENCOUNTER — Other Ambulatory Visit: Payer: Self-pay | Admitting: Nurse Practitioner

## 2014-08-07 MED ORDER — MECLIZINE HCL 25 MG PO TABS: ORAL_TABLET | ORAL | Status: DC

## 2014-09-09 ENCOUNTER — Telehealth: Payer: Self-pay | Admitting: Cardiology

## 2014-09-09 ENCOUNTER — Ambulatory Visit (INDEPENDENT_AMBULATORY_CARE_PROVIDER_SITE_OTHER): Payer: PPO | Admitting: *Deleted

## 2014-09-09 DIAGNOSIS — I5022 Chronic systolic (congestive) heart failure: Secondary | ICD-10-CM | POA: Diagnosis not present

## 2014-09-09 DIAGNOSIS — I429 Cardiomyopathy, unspecified: Secondary | ICD-10-CM

## 2014-09-09 DIAGNOSIS — I428 Other cardiomyopathies: Secondary | ICD-10-CM

## 2014-09-09 NOTE — Telephone Encounter (Signed)
°  New Message  4. Are you calling to see if we received your device transmission? Y   And to whom is she supposed to transmit the signal too?  Please call

## 2014-09-09 NOTE — Telephone Encounter (Signed)
Explained to patient that her device automatically transmits remotely overnight and that she does not need to do anything unless we call her asking for a manual transmission.  Patient voices understanding and is appreciate of the call.

## 2014-09-10 NOTE — Progress Notes (Signed)
Remote ICD transmission.   

## 2014-09-12 ENCOUNTER — Other Ambulatory Visit: Payer: Self-pay

## 2014-09-12 MED ORDER — FUROSEMIDE 20 MG PO TABS: 20.0000 mg | ORAL_TABLET | Freq: Every day | ORAL | Status: DC

## 2014-09-13 LAB — CUP PACEART REMOTE DEVICE CHECK
Battery Remaining Longevity: 65 mo
Battery Voltage: 2.98 V
Brady Statistic AP VP Percent: 0.19 %
Brady Statistic AP VS Percent: 0.02 %
Brady Statistic AS VP Percent: 98.11 %
Brady Statistic AS VS Percent: 1.69 %
Brady Statistic RA Percent Paced: 0.2 %
Brady Statistic RV Percent Paced: 66.74 %
Date Time Interrogation Session: 20160822073423
HighPow Impedance: 73 Ohm
Lead Channel Impedance Value: 1045 Ohm
Lead Channel Impedance Value: 380 Ohm
Lead Channel Impedance Value: 380 Ohm
Lead Channel Impedance Value: 456 Ohm
Lead Channel Impedance Value: 456 Ohm
Lead Channel Impedance Value: 532 Ohm
Lead Channel Impedance Value: 570 Ohm
Lead Channel Impedance Value: 627 Ohm
Lead Channel Impedance Value: 665 Ohm
Lead Channel Impedance Value: 779 Ohm
Lead Channel Impedance Value: 817 Ohm
Lead Channel Impedance Value: 893 Ohm
Lead Channel Impedance Value: 988 Ohm
Lead Channel Pacing Threshold Amplitude: 0.625 V
Lead Channel Pacing Threshold Amplitude: 0.75 V
Lead Channel Pacing Threshold Amplitude: 2.125 V
Lead Channel Pacing Threshold Pulse Width: 0.4 ms
Lead Channel Pacing Threshold Pulse Width: 0.4 ms
Lead Channel Pacing Threshold Pulse Width: 0.8 ms
Lead Channel Sensing Intrinsic Amplitude: 16 mV
Lead Channel Sensing Intrinsic Amplitude: 16 mV
Lead Channel Sensing Intrinsic Amplitude: 3.625 mV
Lead Channel Sensing Intrinsic Amplitude: 3.625 mV
Lead Channel Setting Pacing Amplitude: 2 V
Lead Channel Setting Pacing Amplitude: 2.5 V
Lead Channel Setting Pacing Amplitude: 3.25 V
Lead Channel Setting Pacing Pulse Width: 0.4 ms
Lead Channel Setting Pacing Pulse Width: 0.8 ms
Lead Channel Setting Sensing Sensitivity: 0.3 mV
Zone Setting Detection Interval: 300 ms
Zone Setting Detection Interval: 340 ms
Zone Setting Detection Interval: 360 ms
Zone Setting Detection Interval: 400 ms

## 2014-09-18 ENCOUNTER — Encounter: Payer: Self-pay | Admitting: Cardiology

## 2014-09-24 ENCOUNTER — Encounter: Payer: Self-pay | Admitting: Internal Medicine

## 2014-10-07 ENCOUNTER — Ambulatory Visit (HOSPITAL_BASED_OUTPATIENT_CLINIC_OR_DEPARTMENT_OTHER): Payer: PPO | Admitting: Family

## 2014-10-07 ENCOUNTER — Other Ambulatory Visit (HOSPITAL_BASED_OUTPATIENT_CLINIC_OR_DEPARTMENT_OTHER): Payer: PPO

## 2014-10-07 ENCOUNTER — Other Ambulatory Visit: Payer: Self-pay | Admitting: Family

## 2014-10-07 ENCOUNTER — Other Ambulatory Visit: Payer: Self-pay

## 2014-10-07 ENCOUNTER — Ambulatory Visit (HOSPITAL_BASED_OUTPATIENT_CLINIC_OR_DEPARTMENT_OTHER)
Admission: RE | Admit: 2014-10-07 | Discharge: 2014-10-07 | Disposition: A | Discharge status: Home / Self Care | Payer: PPO | Source: Ambulatory Visit | Attending: Family | Admitting: Family

## 2014-10-07 VITALS — BP 139/63 | HR 74 | Temp 97.9°F | Resp 16 | Ht 66.0 in | Wt 224.0 lb

## 2014-10-07 DIAGNOSIS — M7989 Other specified soft tissue disorders: Secondary | ICD-10-CM

## 2014-10-07 DIAGNOSIS — I890 Lymphedema, not elsewhere classified: Secondary | ICD-10-CM | POA: Diagnosis not present

## 2014-10-07 DIAGNOSIS — M79602 Pain in left arm: Secondary | ICD-10-CM | POA: Diagnosis not present

## 2014-10-07 DIAGNOSIS — Z853 Personal history of malignant neoplasm of breast: Secondary | ICD-10-CM | POA: Diagnosis not present

## 2014-10-07 DIAGNOSIS — M12812 Other specific arthropathies, not elsewhere classified, left shoulder: Secondary | ICD-10-CM | POA: Insufficient documentation

## 2014-10-07 DIAGNOSIS — M25532 Pain in left wrist: Secondary | ICD-10-CM | POA: Diagnosis not present

## 2014-10-07 DIAGNOSIS — M25512 Pain in left shoulder: Secondary | ICD-10-CM | POA: Insufficient documentation

## 2014-10-07 NOTE — Progress Notes (Signed)
Hematology and Oncology Follow Up Visit  Brenda Marsh [id number] 1945/03/31 69 y.o. 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 This developed on Thursday after she had been left and carrying some heavy buckets. She has been taking Advil for the pain. She has +1 radial and brachial pulses in the left arm.  We did x-rays today and the results were negative. We also did an ultrasound of the left arm which was negative for DVT.  She has had no issues with SOB, chest pain, fatigue, fever, n/v, cough or rash. No sign of infections. No fever or n/v.  There has been no change in her bowel or bladder habits. No blood in the stool.  She has a good appetite and is staying hydrated. Her weight continues to be stable.   Medications:    Medication List       This list is accurate as of: 10/07/14 11:31 AM.  Always use your most recent med list.               acetaminophen 325 MG tablet  Commonly known as:  TYLENOL  Take 1-2 tablets (325-650 mg total) by mouth every 4 (four) hours as needed for mild pain.     carvedilol 12.5 MG tablet  Commonly known as:  COREG  Take 1 tablet (12.5 mg total) by mouth 2 (two) times daily with a meal.     furosemide 20 MG tablet  Commonly known as:  LASIX  Take 1 tablet (20 mg total) by mouth daily.     lidocaine 5 %  Commonly known as:  LIDODERM  Place 1 patch onto the skin daily as needed (for back pain). Remove & Discard patch within 12 hours or as directed by MD     losartan 50 MG tablet  Commonly known as:  COZAAR  TAKE 1 TABLET BY MOUTH DAILY     meclizine 25 MG tablet  Commonly known as:  ANTIVERT  TAKE 1 TABLET BY MOUTH THREE TIMES DAILY AS NEEDED FOR DIZZINESS OR NAUSEA     multivitamin capsule  Take 1 capsule by mouth daily.     OVER THE  COUNTER MEDICATION  Apply 1 application topically daily as needed (OTC roll on pain reliever).     promethazine 12.5 MG tablet  Commonly known as:  PHENERGAN  Take 12.5 mg by mouth every 6 (six) hours as needed for nausea or vomiting.     vitamin B-12 1000 MCG tablet  Commonly known as:  CYANOCOBALAMIN  Take 1,000 mcg by mouth daily.     Vitamin D 400 UNITS capsule  Take 400 Units by mouth daily.        Allergies:  Allergies  Allergen Reactions  . Contrast Media [Iodinated Diagnostic Agents]     N&V  . Lisinopril     REACTION: cough  . Naproxen Sodium     REACTION: Breathing difficulty  . Penicillins     REACTION: Welps  . Shellfish Allergy     Cant breath   . Simvastatin     REACTION: muscle aches    Past Medical History, Surgical history, Social history, and Family History were reviewed and updated.  Review of Systems: All other 10 point review of systems is negative.  Physical Exam:  height is 5\' 6"  (1.676 m) and weight is 224 lb (101.606 kg). Her oral temperature is 97.9 F (36.6 C). Her blood pressure is 139/63 and her pulse is 74. Her respiration is 16.   Wt Readings from Last 3 Encounters:  10/07/14 224 lb (101.606 kg)  02/27/14 221 lb 12.8 oz (100.608 kg)  02/26/14 221 lb 8 oz (100.472 kg)    Ocular: Sclerae unicteric, pupils equal, round and reactive to light Ear-nose-throat: Oropharynx clear, dentition fair Lymphatic: No cervical or supraclavicular adenopathy Lungs no rales or rhonchi, good excursion bilaterally Heart regular rate and rhythm, no murmur appreciated Abd soft, nontender, positive bowel sounds MSK no focal spinal tenderness, no joint edema Neuro: non-focal, well-oriented, appropriate affect Breasts: No changes. No mass, lesion, mass or lymphadenopathy found on exam.   Lab Results  Component Value Date   WBC 5.6 02/27/2014   HGB 13.0 02/27/2014   HCT 38.7 02/27/2014   MCV 90.5 02/27/2014   PLT 197.0 02/27/2014   Lab Results    Component Value Date   IRON 56 12/21/2012   TIBC 429 12/21/2012   UIBC 373 12/21/2012   IRONPCTSAT 13* 12/21/2012   Lab Results  Component Value Date   RBC 4.28 02/27/2014   No results found for: KPAFRELGTCHN, LAMBDASER, KAPLAMBRATIO No results found for: IGGSERUM, IGA, IGMSERUM No results found for: Odetta Pink, SPEI   Chemistry      Component Value Date/Time   NA 139 02/27/2014 1015   K 4.2 02/27/2014 1015   CL 106 02/27/2014 1015   CO2 26 02/27/2014 1015   BUN 11 02/27/2014 1015   CREATININE 0.72 02/27/2014 1015   CREATININE 0.69 07/27/2013 1407      Component Value Date/Time   CALCIUM 9.6 02/27/2014 1015   ALKPHOS 83 02/27/2014 1015   AST 24 02/27/2014 1015   ALT 19 02/27/2014 1015   BILITOT 0.4 02/27/2014 1015     Impression and Plan: Brenda Marsh is a 69 yo African American female with a history of stage IIA infiltrating ductal carcinoma left breast. She completed treatment 13 years ago. So far, there has been no evidence of recurrence. She is here today with lymphedema of the left arm after letting some heavy buckets last week.  Xray and US of the left arm were both negative.  We will have her try icing it and then using her compression sleeve to help reduce the edema.  She would prefer to keep taking Advil for pain. This will be fine.  We will continue to follow-up with her as needed.  She knows to contact us with any questions or concerns. We can certainly see her anytime she might need [place].   Eliezer Bottom, NP 9/19/201611:31 AM

## 2014-10-15 ENCOUNTER — Other Ambulatory Visit: Payer: Self-pay | Admitting: *Deleted

## 2014-10-15 MED ORDER — FUROSEMIDE 20 MG PO TABS: 20.0000 mg | ORAL_TABLET | Freq: Every day | ORAL | Status: DC

## 2014-10-15 NOTE — Progress Notes (Signed)
HPI: FU dilated cardiomyopathy. Patient has a history of left bundle branch block. She underwent cardiac catheterization in [facility] in March of 2010 for reduced LV function. Ejection fraction was 35%. The left main was normal. The LAD had a proximal 25-30% lesion. The circumflex was normal. The right coronary artery had a 15% lesion. Patient was treated medically. Carotid Dopplers in October 2015 showed a 50-69% right and no left stenosis. Echocardiogram in August 2015 showed an ejection fraction of 25-30% and restrictive filling. Had biventricular ICD October 2015. Since she was last seen, the patient denies any dyspnea on exertion, orthopnea, PND, pedal edema, palpitations, syncope or chest pain.   Current Outpatient Prescriptions  Medication Sig Dispense Refill  . carvedilol (COREG) 12.5 MG tablet Take 1 tablet (12.5 mg total) by mouth 2 (two) times daily with a meal. 180 tablet 3  . Cholecalciferol (VITAMIN D) 400 UNITS capsule Take 400 Units by mouth daily.    . furosemide (LASIX) 20 MG tablet Take 1 tablet (20 mg total) by mouth daily. 30 tablet 5  . losartan (COZAAR) 50 MG tablet TAKE 1 TABLET BY MOUTH DAILY 90 tablet 0  . meclizine (ANTIVERT) 25 MG tablet TAKE 1 TABLET BY MOUTH THREE TIMES DAILY AS NEEDED FOR DIZZINESS OR NAUSEA 45 tablet 4  . Multiple Vitamin (MULTIVITAMIN) capsule Take 1 capsule by mouth daily.     No current facility-administered medications for this visit.     Past Medical History  Diagnosis Date  . Nonischemic cardiomyopathy   . LBBB (left bundle branch block)   . Cancer 1999    Breast; lumpectomy, radiation and chemotherapy  . Colon polyp     unclear pathology  . Hyperlipidemia   . Hypertension   . Arthritis     left knee pain, MRI in 2008-tricompartmental  degenerate changes, mucoid degeneration of ACL, post horn meniscal tear and ant horn meniscal tear  . Low back pain     left L3-4 injected  . Unspecified constipation 04/22/2012  . Anemia  07/16/2012  . Diabetes mellitus type 2, diet-controlled 03/11/2009    Qualifier: Diagnosis of  By: Wynona Luna      Past Surgical History  Procedure Laterality Date  . Appendectomy  2009    Dr. Lucia Gaskins  . Carpal tunnel release  2007    Dr. Daylene Katayama  . Abdominal hysterectomy    . Breast surgery      lumpectomy  . Tonsillectomy    . Knee arthroscopy      Right  . Bi-ventricular implantable cardioverter defibrillator  (crt-d)  11/16/13    MDT CRTD implanted by Dr Lovena Le for NICM, CHF, and LBBB  . Bi-ventricular implantable cardioverter defibrillator N/A 11/16/2013    Procedure: BI-VENTRICULAR IMPLANTABLE CARDIOVERTER DEFIBRILLATOR  (CRT-D);  Surgeon: Evans Lance, MD;  Location: [facility] CATH LAB;  Service: Cardiovascular;  Laterality: N/A;    Social History   Social History  . Marital Status: Widowed    Spouse Name: N/A  . Number of Children: 2  . Years of Education: N/A   Occupational History  .     Social History Main Topics  . Smoking status: Former Smoker    Quit date: 01/18/1970  . Smokeless tobacco: Never Used  . Alcohol Use: Yes     Comment: Rare  . Drug Use: Not on file  . Sexual Activity: Not on file   Other Topics Concern  . Not on file   Social History  Narrative   Widow - husband died in 04/22/03   Occupation - retired from working in Radiation protection practitioner   2 daughter - on lives in [place], other in [place]   Remote history of tobacco but none in 40 years   Alcohol use - no    ROS: no fevers or chills, productive cough, hemoptysis, dysphasia, odynophagia, melena, hematochezia, dysuria, hematuria, rash, seizure activity, orthopnea, PND, pedal edema, claudication. Remaining systems are negative.  Physical Exam: Well-developed obese in no acute distress.  Skin is warm and dry.  HEENT is normal.  Neck is supple.  Chest is clear to auscultation with normal expansion.  Cardiovascular exam is regular rate and rhythm.  Abdominal exam nontender or distended. No masses  palpated. Extremities show no edema. Lymphedema left upper extremity neuro grossly intact  ECG sinus rhythm with ventricular pacing.

## 2014-10-16 ENCOUNTER — Encounter: Payer: Self-pay | Admitting: Cardiology

## 2014-10-16 ENCOUNTER — Other Ambulatory Visit: Payer: Self-pay | Admitting: *Deleted

## 2014-10-16 ENCOUNTER — Ambulatory Visit (INDEPENDENT_AMBULATORY_CARE_PROVIDER_SITE_OTHER): Payer: PPO | Admitting: Cardiology

## 2014-10-16 DIAGNOSIS — I679 Cerebrovascular disease, unspecified: Secondary | ICD-10-CM

## 2014-10-16 DIAGNOSIS — I5022 Chronic systolic (congestive) heart failure: Secondary | ICD-10-CM | POA: Diagnosis not present

## 2014-10-16 MED ORDER — SPIRONOLACTONE 25 MG PO TABS: 12.5000 mg | ORAL_TABLET | Freq: Every day | ORAL | Status: DC

## 2014-10-16 MED ORDER — ASPIRIN EC 81 MG PO TBEC: 81.0000 mg | DELAYED_RELEASE_TABLET | Freq: Every day | ORAL | Status: DC

## 2014-10-16 MED ORDER — LOSARTAN POTASSIUM 50 MG PO TABS: 50.0000 mg | ORAL_TABLET | Freq: Every day | ORAL | Status: DC

## 2014-10-16 NOTE — Assessment & Plan Note (Signed)
Followed by electrophysiology. 

## 2014-10-16 NOTE — Assessment & Plan Note (Signed)
Continue ARB and beta blocker. Continue present dose of Lasix. Add spironolactone 12.5 mg daily. Check potassium and renal function in 1 week.

## 2014-10-16 NOTE — Assessment & Plan Note (Signed)
Blood pressure controlled. Continue present medications. 

## 2014-10-16 NOTE — Assessment & Plan Note (Signed)
Euvolemic.continue present dose of Lasix.

## 2014-10-16 NOTE — Assessment & Plan Note (Signed)
Intolerant to statins. 

## 2014-10-16 NOTE — Assessment & Plan Note (Signed)
Follow-up carotid Dopplers October 2016. Aspirin has caused GI upset previously. She is willing to try 81 mg daily. Intolerant to statins.

## 2014-10-16 NOTE — Patient Instructions (Addendum)
Your physician wants you to follow-up in: [place] will receive a reminder letter in the mail two months in advance. If you don't receive a letter, please call our office to schedule the follow-up appointment.   START SPIRONOLACTONE 12.5 MG ONCE DAILY= 1/2 OF 25 MG TABLET ONCE DAILY  Your physician has requested that you have a carotid duplex. This test is an ultrasound of the carotid arteries in your neck. It looks at blood flow through these arteries that supply the brain with blood. Allow one hour for this exam. There are no restrictions or special instructions.IN THE [place] [facility]ICE-[facility] DEPARTMENT-[id number]-DUE AFTER 11-13-14  START ASPIRIN 3 MG ONCE DAILY  Your physician recommends that you return for lab work in: [place]

## 2014-11-20 LAB — BASIC METABOLIC PANEL
BUN: 9 mg/dL (ref 7–25)
CALCIUM: 9.2 mg/dL (ref 8.6–10.4)
CO2: 20 mmol/L (ref 20–31)
Chloride: 105 mmol/L (ref 98–110)
Creat: 0.71 mg/dL (ref 0.50–0.99)
Glucose, Bld: 75 mg/dL (ref 65–99)
Potassium: 4.2 mmol/L (ref 3.5–5.3)
Sodium: 138 mmol/L (ref 135–146)

## 2014-11-23 ENCOUNTER — Encounter (HOSPITAL_BASED_OUTPATIENT_CLINIC_OR_DEPARTMENT_OTHER): Payer: Self-pay | Admitting: Emergency Medicine

## 2014-11-23 ENCOUNTER — Inpatient Hospital Stay (HOSPITAL_BASED_OUTPATIENT_CLINIC_OR_DEPARTMENT_OTHER)
Admission: EM | Admit: 2014-11-23 | Discharge: 2014-11-25 | DRG: 149 | Disposition: A | Discharge status: Home / Self Care | Payer: PPO | Attending: Internal Medicine | Admitting: Internal Medicine

## 2014-11-23 ENCOUNTER — Emergency Department (HOSPITAL_BASED_OUTPATIENT_CLINIC_OR_DEPARTMENT_OTHER): Payer: PPO

## 2014-11-23 DIAGNOSIS — E782 Mixed hyperlipidemia: Secondary | ICD-10-CM | POA: Diagnosis present

## 2014-11-23 DIAGNOSIS — I5042 Chronic combined systolic (congestive) and diastolic (congestive) heart failure: Secondary | ICD-10-CM | POA: Diagnosis present

## 2014-11-23 DIAGNOSIS — I428 Other cardiomyopathies: Secondary | ICD-10-CM

## 2014-11-23 DIAGNOSIS — I10 Essential (primary) hypertension: Secondary | ICD-10-CM | POA: Diagnosis not present

## 2014-11-23 DIAGNOSIS — H8110 Benign paroxysmal vertigo, unspecified ear: Secondary | ICD-10-CM | POA: Diagnosis present

## 2014-11-23 DIAGNOSIS — R42 Dizziness and giddiness: Secondary | ICD-10-CM | POA: Insufficient documentation

## 2014-11-23 DIAGNOSIS — E119 Type 2 diabetes mellitus without complications: Secondary | ICD-10-CM | POA: Diagnosis present

## 2014-11-23 DIAGNOSIS — E860 Dehydration: Secondary | ICD-10-CM | POA: Diagnosis present

## 2014-11-23 DIAGNOSIS — Z95810 Presence of automatic (implantable) cardiac defibrillator: Secondary | ICD-10-CM | POA: Diagnosis present

## 2014-11-23 DIAGNOSIS — K219 Gastro-esophageal reflux disease without esophagitis: Secondary | ICD-10-CM | POA: Diagnosis present

## 2014-11-23 DIAGNOSIS — I110 Hypertensive heart disease with heart failure: Secondary | ICD-10-CM | POA: Diagnosis not present

## 2014-11-23 DIAGNOSIS — R55 Syncope and collapse: Secondary | ICD-10-CM | POA: Insufficient documentation

## 2014-11-23 DIAGNOSIS — Z7982 Long term (current) use of aspirin: Secondary | ICD-10-CM | POA: Diagnosis not present

## 2014-11-23 DIAGNOSIS — I509 Heart failure, unspecified: Secondary | ICD-10-CM | POA: Diagnosis not present

## 2014-11-23 DIAGNOSIS — R9431 Abnormal electrocardiogram [ECG] [EKG]: Secondary | ICD-10-CM | POA: Diagnosis present

## 2014-11-23 HISTORY — DX: Presence of cardiac pacemaker: Z95.0

## 2014-11-23 HISTORY — DX: Presence of automatic (implantable) cardiac defibrillator: Z95.810

## 2014-11-23 LAB — CBC WITH DIFFERENTIAL/PLATELET
BASOS ABS: 0 10*3/uL (ref 0.0–0.1)
Basophils Relative: 1 %
EOS ABS: 0.4 10*3/uL (ref 0.0–0.7)
EOS PCT: 6 %
HCT: 33.8 % — ABNORMAL LOW (ref 36.0–46.0)
Hemoglobin: 11.3 g/dL — ABNORMAL LOW (ref 12.0–15.0)
Lymphocytes Relative: 34 %
Lymphs Abs: 2.2 10*3/uL (ref 0.7–4.0)
MCH: 29.7 pg (ref 26.0–34.0)
MCHC: 33.4 g/dL (ref 30.0–36.0)
MCV: 88.9 fL (ref 78.0–100.0)
MONO ABS: 0.5 10*3/uL (ref 0.1–1.0)
Monocytes Relative: 8 %
Neutro Abs: 3.4 10*3/uL (ref 1.7–7.7)
Neutrophils Relative %: 51 %
PLATELETS: 212 10*3/uL (ref 150–400)
RBC: 3.8 MIL/uL — AB (ref 3.87–5.11)
RDW: 14.5 % (ref 11.5–15.5)
WBC: 6.5 10*3/uL (ref 4.0–10.5)

## 2014-11-23 LAB — BASIC METABOLIC PANEL
ANION GAP: 8 (ref 5–15)
BUN: 12 mg/dL (ref 6–20)
CALCIUM: 9.5 mg/dL (ref 8.9–10.3)
CO2: 24 mmol/L (ref 22–32)
Chloride: 108 mmol/L (ref 101–111)
Creatinine, Ser: 0.71 mg/dL (ref 0.44–1.00)
GFR calc Af Amer: >60 mL/min (ref >60)
Glucose, Bld: 105 mg/dL — ABNORMAL HIGH (ref 65–99)
POTASSIUM: 3.7 mmol/L (ref 3.5–5.1)
SODIUM: 140 mmol/L (ref 135–145)

## 2014-11-23 LAB — PHOSPHORUS: PHOSPHORUS: 3.8 mg/dL (ref 2.5–4.6)

## 2014-11-23 LAB — URINALYSIS, ROUTINE W REFLEX MICROSCOPIC
Bilirubin Urine: NEGATIVE
GLUCOSE, UA: NEGATIVE mg/dL
Hgb urine dipstick: NEGATIVE
KETONES UR: NEGATIVE mg/dL
NITRITE: NEGATIVE
PH: 6 (ref 5.0–8.0)
Protein, ur: NEGATIVE mg/dL
SPECIFIC GRAVITY, URINE: 1.016 (ref 1.005–1.030)
Urobilinogen, UA: 0.2 mg/dL (ref 0.0–1.0)

## 2014-11-23 LAB — TROPONIN I

## 2014-11-23 LAB — URINE MICROSCOPIC-ADD ON

## 2014-11-23 LAB — MAGNESIUM: MAGNESIUM: 2 mg/dL (ref 1.7–2.4)

## 2014-11-23 MED ORDER — CHOLECALCIFEROL 10 MCG (400 UNIT) PO TABS
400.0000 [IU] | ORAL_TABLET | Freq: Every day | ORAL | Status: DC
  Administered 2014-11-23 – 2014-11-25 (×3): 400 [IU] via ORAL
  Filled 2014-11-23 (×4): qty 1

## 2014-11-23 MED ORDER — SODIUM CHLORIDE 0.9 % IJ SOLN
3.0000 mL | Freq: Two times a day (BID) | INTRAMUSCULAR | Status: DC
  Administered 2014-11-23 – 2014-11-25 (×5): 3 mL via INTRAVENOUS

## 2014-11-23 MED ORDER — MULTIVITAMINS PO CAPS
1.0000 | ORAL_CAPSULE | Freq: Every day | ORAL | Status: DC
  Filled 2014-11-23: qty 1

## 2014-11-23 MED ORDER — SENNOSIDES-DOCUSATE SODIUM 8.6-50 MG PO TABS: 1.0000 | ORAL_TABLET | Freq: Every evening | ORAL | Status: DC | PRN

## 2014-11-23 MED ORDER — PROMETHAZINE HCL 25 MG PO TABS
25.0000 mg | ORAL_TABLET | Freq: Four times a day (QID) | ORAL | Status: DC | PRN
  Administered 2014-11-23: 25 mg via ORAL
  Filled 2014-11-23: qty 1

## 2014-11-23 MED ORDER — SODIUM CHLORIDE 0.9 % IV SOLN
Freq: Once | INTRAVENOUS | Status: AC
  Administered 2014-11-23: 05:00:00 via INTRAVENOUS

## 2014-11-23 MED ORDER — SODIUM CHLORIDE 0.9 % IV SOLN
250.0000 mL | INTRAVENOUS | Status: DC | PRN
  Administered 2014-11-23: 250 mL via INTRAVENOUS

## 2014-11-23 MED ORDER — SODIUM CHLORIDE 0.9 % IJ SOLN: 3.0000 mL | INTRAMUSCULAR | Status: DC | PRN

## 2014-11-23 MED ORDER — ALUM & MAG HYDROXIDE-SIMETH 200-200-20 MG/5ML PO SUSP: 30.0000 mL | Freq: Four times a day (QID) | ORAL | Status: DC | PRN

## 2014-11-23 MED ORDER — FLUTICASONE PROPIONATE 50 MCG/ACT NA SUSP
2.0000 | Freq: Every day | NASAL | Status: DC
  Administered 2014-11-24: 2 via NASAL
  Filled 2014-11-23: qty 16

## 2014-11-23 MED ORDER — ADULT MULTIVITAMIN W/MINERALS CH
1.0000 | ORAL_TABLET | Freq: Every day | ORAL | Status: DC
  Administered 2014-11-23 – 2014-11-25 (×3): 1 via ORAL
  Filled 2014-11-23 (×3): qty 1

## 2014-11-23 MED ORDER — METOCLOPRAMIDE HCL 5 MG/ML IJ SOLN
10.0000 mg | Freq: Four times a day (QID) | INTRAMUSCULAR | Status: DC | PRN
  Administered 2014-11-23 (×2): 10 mg via INTRAVENOUS
  Administered 2014-11-24 (×2): 5 mg via INTRAVENOUS
  Filled 2014-11-23 (×4): qty 2

## 2014-11-23 MED ORDER — METOCLOPRAMIDE HCL 5 MG/ML IJ SOLN
5.0000 mg | Freq: Once | INTRAMUSCULAR | Status: AC
  Administered 2014-11-23: 5 mg via INTRAVENOUS
  Filled 2014-11-23: qty 2

## 2014-11-23 MED ORDER — CARVEDILOL 12.5 MG PO TABS
12.5000 mg | ORAL_TABLET | Freq: Two times a day (BID) | ORAL | Status: DC
  Administered 2014-11-23 – 2014-11-25 (×4): 12.5 mg via ORAL
  Filled 2014-11-23 (×4): qty 1

## 2014-11-23 MED ORDER — DIAZEPAM 5 MG/ML IJ SOLN
5.0000 mg | Freq: Once | INTRAMUSCULAR | Status: AC
  Administered 2014-11-23: 5 mg via INTRAVENOUS
  Filled 2014-11-23: qty 2

## 2014-11-23 MED ORDER — ENOXAPARIN SODIUM 40 MG/0.4ML ~~LOC~~ SOLN
40.0000 mg | SUBCUTANEOUS | Status: DC
  Administered 2014-11-23 – 2014-11-25 (×3): 40 mg via SUBCUTANEOUS
  Filled 2014-11-23 (×3): qty 0.4

## 2014-11-23 MED ORDER — MECLIZINE HCL 25 MG PO TABS
25.0000 mg | ORAL_TABLET | Freq: Once | ORAL | Status: AC
  Administered 2014-11-23: 25 mg via ORAL
  Filled 2014-11-23: qty 1

## 2014-11-23 MED ORDER — LOSARTAN POTASSIUM 50 MG PO TABS
50.0000 mg | ORAL_TABLET | Freq: Every day | ORAL | Status: DC
  Administered 2014-11-23 – 2014-11-24 (×2): 50 mg via ORAL
  Filled 2014-11-23 (×3): qty 1

## 2014-11-23 MED ORDER — MECLIZINE HCL 25 MG PO TABS
25.0000 mg | ORAL_TABLET | Freq: Three times a day (TID) | ORAL | Status: DC | PRN
  Administered 2014-11-23: 25 mg via ORAL
  Filled 2014-11-23 (×3): qty 1

## 2014-11-23 MED ORDER — POTASSIUM CHLORIDE 10 MEQ/100ML IV SOLN
10.0000 meq | INTRAVENOUS | Status: AC
  Administered 2014-11-23 (×3): 10 meq via INTRAVENOUS
  Filled 2014-11-23 (×3): qty 100

## 2014-11-23 NOTE — ED Notes (Signed)
Phone Report - Hand Off Report provided to Cindy-RN at rec unit at [facility], Also spoke with [id number] with [facility]

## 2014-11-23 NOTE — ED Notes (Signed)
Report given to Michael, RN.

## 2014-11-23 NOTE — ED Notes (Signed)
Placed on cont cardiac monitoring per adm MD orders

## 2014-11-23 NOTE — ED Provider Notes (Addendum)
CSN: [id number]     Arrival date & time 11/23/14  0341 History   First MD Initiated Contact with Patient 11/23/14 0341     Chief Complaint  Patient presents with  . Dizziness  . Emesis     (Consider location/radiation/quality/duration/timing/severity/associated sxs/prior Treatment) HPI Comments: Patient presents to the emergency department for evaluation of vertigo. Patient reports that she started having intermittent symptoms of vertigo yesterday. She woke in the middle of the night feeling increase in symptoms. She reports that she felt like the room was spinning she became nauseated and vomited. Patient denies headache. There is no chest pain or shortness of breath. Patient reports that she did have an episode of vertigo approximately a year ago for which she was hospitalized.  Patient is a 69 y.o. female presenting with dizziness and vomiting.  Dizziness Associated symptoms: vomiting   Emesis   Past Medical History  Diagnosis Date  . Nonischemic cardiomyopathy ([place])   . LBBB (left bundle branch block)   . Cancer ([place]) 1999    Breast; lumpectomy, radiation and chemotherapy  . Colon polyp     unclear pathology  . Hyperlipidemia   . Hypertension   . Arthritis     left knee pain, MRI in 2008-tricompartmental  degenerate changes, mucoid degeneration of ACL, post horn meniscal tear and ant horn meniscal tear  . Low back pain     left L3-4 injected  . Unspecified constipation 04/22/2012  . Anemia 07/16/2012  . Diabetes mellitus type 2, diet-controlled ([place]) 03/11/2009    Qualifier: Diagnosis of  By: Wynona Luna     Past Surgical History  Procedure Laterality Date  . Appendectomy  2009    Dr. Lucia Gaskins  . Carpal tunnel release  2007    Dr. Daylene Katayama  . Abdominal hysterectomy    . Breast surgery      lumpectomy  . Tonsillectomy    . Knee arthroscopy      Right  . Bi-ventricular implantable cardioverter defibrillator  (crt-d)  11/16/13    MDT CRTD implanted by Dr Lovena Le for  NICM, CHF, and LBBB  . Bi-ventricular implantable cardioverter defibrillator N/A 11/16/2013    Procedure: BI-VENTRICULAR IMPLANTABLE CARDIOVERTER DEFIBRILLATOR  (CRT-D);  Surgeon: Evans Lance, MD;  Location: [facility] CATH LAB;  Service: Cardiovascular;  Laterality: N/A;   Family History  Problem Relation Age of Onset  . Heart attack Mother 32  . Diabetes Sister   . Diabetes Brother   . Coronary artery disease Other     Female first degree relative <60  . Hyperlipidemia Other   . Hypertension Other   . Cancer Other     prostate, 1st degree relative <50   Social History  Substance Use Topics  . Smoking status: Former Smoker    Quit date: 01/18/1970  . Smokeless tobacco: Never Used  . Alcohol Use: Yes     Comment: Rare   OB History    No data available     Review of Systems  Gastrointestinal: Positive for vomiting.  Neurological: Positive for dizziness.  All other systems reviewed and are negative.     Allergies  Contrast media; Lisinopril; Naproxen sodium; Penicillins; Shellfish allergy; and Simvastatin  Home Medications   Prior to Admission medications   Medication Sig Start Date End Date Taking? Authorizing Provider  carvedilol (COREG) 12.5 MG tablet Take 1 tablet (12.5 mg total) by mouth 2 (two) times daily with a meal. 02/27/14  Yes Lelon Perla, MD  Cholecalciferol (VITAMIN  D) 400 UNITS capsule Take 400 Units by mouth daily.   Yes Historical Provider, MD  furosemide (LASIX) 20 MG tablet Take 1 tablet (20 mg total) by mouth daily. 10/15/14  Yes Lelon Perla, MD  losartan (COZAAR) 50 MG tablet Take 1 tablet (50 mg total) by mouth daily. 10/16/14  Yes Lelon Perla, MD  meclizine (ANTIVERT) 25 MG tablet TAKE 1 TABLET BY MOUTH THREE TIMES DAILY AS NEEDED FOR DIZZINESS OR NAUSEA 08/07/14  Yes [name], MD  Multiple Vitamin (MULTIVITAMIN) capsule Take 1 capsule by mouth daily.   Yes Historical Provider, MD  spironolactone (ALDACTONE) 25 MG tablet Take 0.5  tablets (12.5 mg total) by mouth daily. 10/16/14  Yes Lelon Perla, MD  aspirin EC 81 MG tablet Take 1 tablet (81 mg total) by mouth daily. 10/16/14   Lelon Perla, MD   BP 127/57 mmHg  Pulse 72  Temp(Src) 98.1 F (36.7 C) (Oral)  Resp 20  Ht 5\' 5"  (1.651 m)  Wt 225 lb (102.059 kg)  BMI 37.44 kg/m2  SpO2 100% Physical Exam  Constitutional: She is oriented to person, place, and time. She appears well-developed and well-nourished. No distress.  HENT:  Head: Normocephalic and atraumatic.  Right Ear: Hearing normal.  Left Ear: Hearing normal.  Nose: Nose normal.  Mouth/Throat: Oropharynx is clear and moist and mucous membranes are normal.  Eyes: Conjunctivae and EOM are normal. Pupils are equal, round, and reactive to light.  Neck: Normal range of motion. Neck supple.  Cardiovascular: Regular rhythm, S1 normal and S2 normal.  Exam reveals no gallop and no friction rub.   No murmur heard. Pulmonary/Chest: Effort normal and breath sounds normal. No respiratory distress. She exhibits no tenderness.  Abdominal: Soft. Normal appearance and bowel sounds are normal. There is no hepatosplenomegaly. There is no tenderness. There is no rebound, no guarding, no tenderness at McBurney's point and negative Murphy's sign. No hernia.  Musculoskeletal: Normal range of motion.  Neurological: She is alert and oriented to person, place, and time. She has normal strength. No cranial nerve deficit or sensory deficit. Coordination normal. GCS eye subscore is 4. GCS verbal subscore is 5. GCS motor subscore is 6.  Extraocular muscle movement: normal No visual field cut Pupils: equal and reactive both direct and consensual response is normal Nystagmus cannot be determined because patient has significant difficulty tracking with moving her eyes which causes her severe spinning sensation nausea    Sensory function is intact to light touch, pinprick Proprioception intact  Grip strength 5/5 symmetric in  upper extremities No pronator drift Normal finger to nose bilaterally  Lower extremity strength 5/5 against gravity Normal heel to shin bilaterally     Skin: Skin is warm, dry and intact. No rash noted. No cyanosis.  Psychiatric: She has a normal mood and affect. Her speech is normal and behavior is normal. Thought content normal.  Nursing note and vitals reviewed.   ED Course  Procedures (including critical care time) Labs Review Labs Reviewed  CBC WITH DIFFERENTIAL/PLATELET - Abnormal; Notable for the following:    RBC 3.80 (*)    Hemoglobin 11.3 (*)    HCT 33.8 (*)    All other components within normal limits  BASIC METABOLIC PANEL - Abnormal; Notable for the following:    Glucose, Bld 105 (*)    All other components within normal limits    Imaging Review Ct Head Wo Contrast  11/23/2014  CLINICAL DATA:  Lightheadedness.  Vomiting. EXAM:  CT HEAD WITHOUT CONTRAST TECHNIQUE: Contiguous axial images were obtained from the base of the skull through the vertex without intravenous contrast. COMPARISON:  06/26/2012 FINDINGS: There is no intracranial hemorrhage, mass or evidence of acute infarction. There is no extra-axial fluid collection. Gray matter and white matter appear normal. Cerebral volume is normal for age. Brainstem and posterior fossa are unremarkable. The CSF spaces appear normal. The bony structures are intact. The visible portions of the paranasal sinuses are clear. IMPRESSION: Normal brain Electronically Signed   By: Andreas Newport M.D.   On: 11/23/2014 05:11   I have personally reviewed and evaluated these images and lab results as part of my medical decision-making.   EKG Interpretation None      Paced rhythm, no further analysis   MDM   Final diagnoses:  Vertigo    Presents to the emergency department for evaluation of vertigo. Symptoms began last night before going to bed and it worsened when she woke up in the middle of the night. Onset of symptoms  cannot be determined, not a candidate for consideration for stroke intervention.  Patient does have a history of benign positional vertigo. Reviewing her records reveals a hospitalization in 2014 with a similar presentation. Workup at that time was negative for central nervous system etiology, patient treated for benign positional vertigo with improvement. Patient agrees that this presentation is identical. I did perform basic workup including head CT. All workup was negative. Patient treated with meclizine and Valium with little improvement. She was given IV fluids, although she does have a history of nonischemic cardiomyopathy, could not liberalize fluids. She was given Reglan for her continued symptoms and still has not improved. Symptoms do seem consistent with peripheral vertigo, but she has not had any resolution, cannot be discharged, will require hospitalization for further management.    Orpah Greek, MD 11/23/14 0532  Orpah Greek, MD 11/23/14 2505  Orpah Greek, MD 11/23/14 Nettie, MD 11/23/14 [phone]

## 2014-11-23 NOTE — H&P (Signed)
[facility] History and Physical                                                                                    Brenda Marsh, is a 69 y.o. female  MRN: [id number]   DOB - 1945/06/13  Admit Date - 11/23/2014  Outpatient Primary MD for the patient is [name], MD  Referring Physician:  Dr. Betsey Holiday  Chief Complaint:   Chief Complaint  Patient presents with  . Dizziness  . Emesis     HPI  Brenda Marsh  is a 69 y.o. female, with mixed systolic and diastolic heart failure, vertigo, HTN, and diet controlled diabetes. She has a history of right breast cancer.  She presented to [facility] with nausea, vomiting, vertigo.  She reports that she felt poorly on waking yesterday morning, the room was spinning, so she took a meclizine.  Later in the morning while sitting in a recliner chair she continued to have symptoms of vertigo and nausea. She reached for something and blacked out momentarily. She pressed on throughout the rest of the day and was functional. She went to bed at evening and awoke at 2 AM this morning with severe vomiting that lasted over an hour. She then called EMS and went to [facility].  Brenda Marsh reports that she has had one other episode of severe vertigo that occurred approximately one year ago.  She has 1 new medication, spironolactone 12.5 mg that was started 2 or 3 weeks ago by Dr. Stanford Breed. She denies any fever, cough, chest pain, palpitations, abdominal pain, diarrhea, dysuria.  She complains of fullness in her head and mild congestion.  CT head shows a normal brain. Initial labs are within normal limits. Pulse rate and blood pressure appeared normal.  Review of Systems  Constitutional: Positive for diaphoresis. Negative for fever, chills, weight loss and malaise/fatigue.  HENT: Positive for congestion. Negative for ear discharge, ear pain, hearing loss, nosebleeds, sore throat and tinnitus.   Eyes: Negative.  Negative for blurred vision, double  vision, photophobia and pain.  Respiratory: Negative.  Negative for stridor.   Cardiovascular: Negative.   Gastrointestinal: Positive for nausea and vomiting.  Genitourinary: Negative.   Musculoskeletal: Negative.   Skin: Negative for itching and rash.  Neurological: Positive for dizziness and loss of consciousness. Negative for weakness and headaches.  Psychiatric/Behavioral: Negative.      Past Medical History  Past Medical History  Diagnosis Date  . Nonischemic cardiomyopathy ([place])   . LBBB (left bundle branch block)   . Cancer ([place]) 1999    Breast; lumpectomy, radiation and chemotherapy  . Colon polyp     unclear pathology  . Hyperlipidemia   . Hypertension   . Arthritis     left knee pain, MRI in 2008-tricompartmental  degenerate changes, mucoid degeneration of ACL, post horn meniscal tear and ant horn meniscal tear  . Low back pain     left L3-4 injected  . Unspecified constipation 04/22/2012  . Anemia 07/16/2012  . Diabetes mellitus type 2, diet-controlled ([place]) 03/11/2009    Qualifier: Diagnosis of  By: Wynona Luna      Past Surgical  History  Procedure Laterality Date  . Appendectomy  2009    Dr. Lucia Gaskins  . Carpal tunnel release  2007    Dr. Daylene Katayama  . Abdominal hysterectomy    . Breast surgery      lumpectomy  . Tonsillectomy    . Knee arthroscopy      Right  . Bi-ventricular implantable cardioverter defibrillator  (crt-d)  11/16/13    MDT CRTD implanted by Dr Lovena Le for NICM, CHF, and LBBB  . Bi-ventricular implantable cardioverter defibrillator N/A 11/16/2013    Procedure: BI-VENTRICULAR IMPLANTABLE CARDIOVERTER DEFIBRILLATOR  (CRT-D);  Surgeon: Evans Lance, MD;  Location: [facility] CATH LAB;  Service: Cardiovascular;  Laterality: N/A;      Social History Social History  Substance Use Topics  . Smoking status: Former Smoker    Quit date: 01/18/1970  . Smokeless tobacco: Never Used  . Alcohol Use: Yes     Comment: Rare   no alcohol, no tobacco,  lives alone, independent with ADLs  Family History Family History  Problem Relation Age of Onset  . Heart attack Mother 75  . Diabetes Sister   . Diabetes Brother   . Coronary artery disease Other     Female first degree relative <60  . Hyperlipidemia Other   . Hypertension Other   . Cancer Other     prostate, 1st degree relative <50   her father passed with prostate cancer. Her mother passed with heart disease. No one in the family has had breast cancer or vertigo  Prior to Admission medications   Medication Sig Start Date End Date Taking? Authorizing Provider  carvedilol (COREG) 12.5 MG tablet Take 1 tablet (12.5 mg total) by mouth 2 (two) times daily with a meal. 02/27/14  Yes Lelon Perla, MD  Cholecalciferol (VITAMIN D) 400 UNITS capsule Take 400 Units by mouth daily.   Yes Historical Provider, MD  furosemide (LASIX) 20 MG tablet Take 1 tablet (20 mg total) by mouth daily. 10/15/14  Yes Lelon Perla, MD  losartan (COZAAR) 50 MG tablet Take 1 tablet (50 mg total) by mouth daily. 10/16/14  Yes Lelon Perla, MD  meclizine (ANTIVERT) 25 MG tablet TAKE 1 TABLET BY MOUTH THREE TIMES DAILY AS NEEDED FOR DIZZINESS OR NAUSEA 08/07/14  Yes [name], MD  Multiple Vitamin (MULTIVITAMIN) capsule Take 1 capsule by mouth daily.   Yes Historical Provider, MD  spironolactone (ALDACTONE) 25 MG tablet Take 0.5 tablets (12.5 mg total) by mouth daily. 10/16/14  Yes Lelon Perla, MD  aspirin EC 81 MG tablet Take 1 tablet (81 mg total) by mouth daily. 10/16/14   Lelon Perla, MD   Allergies:  Contrast, Lisinopril, Naproxen, PCN, Shellfish, Simvastatin.  Physical Exam  Vitals  Blood pressure 122/48, pulse 68, temperature 97.9 F (36.6 C), temperature source Oral, resp. rate 20, height 5\' 5"  (1.651 m), weight 103 kg (227 lb 1.2 oz), SpO2 98 %.   General: Well-developed, well-nourished pleasant female lying in bed in NAD. Patient unable to move due to severe nausea and  dizziness  Psych:  Normal affect and insight, Not Suicidal or Homicidal, Awake Alert, Oriented X 3.  Neuro:   No F.N deficits, ALL C.Nerves Intact, Strength 5/5 all 4 extremities, Sensation intact all 4 extremities.  ENT:  Ears and Eyes appear Normal, Conjunctivae clear, PER. Moist oral mucosa without erythema or exudates.  Neck:  Supple, No lymphadenopathy appreciated  Respiratory:  Symmetrical chest wall movement, Good air movement bilaterally, CTAB.  Cardiac:  RRR, No Murmurs, no LE edema noted, no JVD.    Abdomen:  Positive bowel sounds, Soft, Non tender, Non distended,  No masses appreciated  Skin:  No Cyanosis, Normal Skin Turgor, No Skin Rash or Bruise.  Extremities:  Able to move all 4. 5/5 strength in each,  no effusions. Patient has notable lymphedema in her left arm  Data Review  Wt Readings from Last 3 Encounters:  11/23/14 103 kg (227 lb 1.2 oz)  10/16/14 102.967 kg (227 lb)  10/07/14 101.606 kg (224 lb)    CBC  Recent Labs Lab 11/23/14 0442  WBC 6.5  HGB 11.3*  HCT 33.8*  PLT 212  MCV 88.9  MCH 29.7  MCHC 33.4  RDW 14.5  LYMPHSABS 2.2  MONOABS 0.5  EOSABS 0.4  BASOSABS 0.0    Chemistries   Recent Labs Lab 11/19/14 1341 11/23/14 0442  NA 138 140  K 4.2 3.7  CL 105 108  CO2 20 24  GLUCOSE 75 105*  BUN 9 12  CREATININE 0.71 0.71  CALCIUM 9.2 9.5     Lab Results  Component Value Date   HGBA1C 5.6 03/19/2013      Urinalysis:  Pending  Imaging results:   Ct Head Wo Contrast  11/23/2014  CLINICAL DATA:  Lightheadedness.  Vomiting. EXAM: CT HEAD WITHOUT CONTRAST TECHNIQUE: Contiguous axial images were obtained from the base of the skull through the vertex without intravenous contrast. COMPARISON:  06/26/2012 FINDINGS: There is no intracranial hemorrhage, mass or evidence of acute infarction. There is no extra-axial fluid collection. Gray matter and white matter appear normal. Cerebral volume is normal for age. Brainstem and posterior  fossa are unremarkable. The CSF spaces appear normal. The bony structures are intact. The visible portions of the paranasal sinuses are clear. IMPRESSION: Normal brain Electronically Signed   By: Andreas Newport M.D.   On: 11/23/2014 05:11    My personal review of EKG: Paced.  QTC changed from 493 to 536.   Assessment & Plan  Principal Problem:   Vertigo Active Problems:   Prolonged Q-T interval on ECG   Hyperlipidemia, mixed   Essential hypertension   GERD   Diabetes mellitus type 2, diet-controlled ([place])   ICD (implantable cardioverter-defibrillator), biventricular, in situ   Prolonged QT (536) New from prior EKGs. Uncertain etiology. Possibly electrolyte imbalance Will have AICD interrogated.  Check mag, phosphorus, replete potassium IV Monitor on telemetry   Vertigo with syncopal episode Uncertain etiology. Could be related to electrolytes, AICD, medications, doubt infection We'll cautiously give anti-emetics when necessary (Reglan, meclizine, Phenergan). Have requested that AICD be interrogated. Check TSH, A1c, troponin, UA. Give IV potassium. Hold diuretics for today. Would resume them tomorrow.  Physical therapy evaluation requested Will check 2-D echo. If workup is unrevealing, would consider further brain imaging (CT angiogram) to evaluate posterior circulation, and consider cardiac consultation. Check orthostatic vital signs tomorrow.   Mixed systolic and diastolic heart failure Closely managed by Dr. Stanford Breed. Most recent LVEF was 30%. Patient appears euvolemic. Will continue coreg and ARB. Holding diuretics today.  Recheck 2D echo.   Diet-controlled diabetes Will place on heart healthy diet. Most recent hemoglobin A1c was 5.7.  Recheck A1c.    Consultants Called:    None  Family Communication:     Patient is alert, orientated and understands their plan of care.  Code Status:    Full  Condition:    Guarded.  Potential Disposition:   To home when  improved.  Time spent in minutes : [place],  [place] on 11/23/2014 at 10:43 AM Between 7am to 7pm - Pager - [phone] After 7pm go to [email or web address] - password [email or web address] And look for the night coverage person covering me after hours

## 2014-11-23 NOTE — ED Notes (Signed)
SR x 2 up, family at bedside

## 2014-11-23 NOTE — ED Notes (Signed)
Patient started having some dizziness yesterday and she took Antivert and then she woke up dizzy and vomiting.  She took another Patent attorney.

## 2014-11-23 NOTE — ED Notes (Signed)
Pt considered High Fall Risk, Sr x 2 up, family informed of high fall risk nsg interventions in place, callbell within reach.

## 2014-11-23 NOTE — ED Notes (Signed)
Restricted Extremity Bracelet placed on Left Arm

## 2014-11-23 NOTE — ED Notes (Signed)
200 ml of NS was administered.

## 2014-11-24 ENCOUNTER — Encounter (HOSPITAL_COMMUNITY): Payer: Self-pay | Admitting: Cardiology

## 2014-11-24 DIAGNOSIS — R55 Syncope and collapse: Secondary | ICD-10-CM

## 2014-11-24 DIAGNOSIS — I4581 Long QT syndrome: Secondary | ICD-10-CM

## 2014-11-24 DIAGNOSIS — E119 Type 2 diabetes mellitus without complications: Secondary | ICD-10-CM

## 2014-11-24 DIAGNOSIS — Z95810 Presence of automatic (implantable) cardiac defibrillator: Secondary | ICD-10-CM

## 2014-11-24 DIAGNOSIS — I10 Essential (primary) hypertension: Secondary | ICD-10-CM

## 2014-11-24 DIAGNOSIS — R42 Dizziness and giddiness: Principal | ICD-10-CM

## 2014-11-24 DIAGNOSIS — I428 Other cardiomyopathies: Secondary | ICD-10-CM

## 2014-11-24 LAB — COMPREHENSIVE METABOLIC PANEL
ALK PHOS: 61 U/L (ref 38–126)
ALT: 17 U/L (ref 14–54)
ANION GAP: 9 (ref 5–15)
AST: 23 U/L (ref 15–41)
Albumin: 3 g/dL — ABNORMAL LOW (ref 3.5–5.0)
BUN: 6 mg/dL (ref 6–20)
CALCIUM: 9.5 mg/dL (ref 8.9–10.3)
CO2: 26 mmol/L (ref 22–32)
Chloride: 106 mmol/L (ref 101–111)
Creatinine, Ser: 0.66 mg/dL (ref 0.44–1.00)
GFR calc non Af Amer: >60 mL/min (ref >60)
Glucose, Bld: 96 mg/dL (ref 65–99)
POTASSIUM: 3.8 mmol/L (ref 3.5–5.1)
Sodium: 141 mmol/L (ref 135–145)
Total Bilirubin: 0.6 mg/dL (ref 0.3–1.2)
Total Protein: 6.6 g/dL (ref 6.5–8.1)

## 2014-11-24 LAB — URINE CULTURE

## 2014-11-24 LAB — CBC
HCT: 33.5 % — ABNORMAL LOW (ref 36.0–46.0)
Hemoglobin: 10.9 g/dL — ABNORMAL LOW (ref 12.0–15.0)
MCH: 29.1 pg (ref 26.0–34.0)
MCHC: 32.5 g/dL (ref 30.0–36.0)
MCV: 89.3 fL (ref 78.0–100.0)
PLATELETS: 212 10*3/uL (ref 150–400)
RBC: 3.75 MIL/uL — ABNORMAL LOW (ref 3.87–5.11)
RDW: 14.9 % (ref 11.5–15.5)
WBC: 5.5 10*3/uL (ref 4.0–10.5)

## 2014-11-24 LAB — TSH: TSH: 1.117 u[IU]/mL (ref 0.350–4.500)

## 2014-11-24 MED ORDER — LOSARTAN POTASSIUM 50 MG PO TABS
25.0000 mg | ORAL_TABLET | Freq: Every day | ORAL | Status: DC
  Administered 2014-11-25: 25 mg via ORAL
  Filled 2014-11-24: qty 1

## 2014-11-24 MED ORDER — MECLIZINE HCL 12.5 MG PO TABS
12.5000 mg | ORAL_TABLET | Freq: Two times a day (BID) | ORAL | Status: DC
  Administered 2014-11-24 – 2014-11-25 (×2): 12.5 mg via ORAL
  Filled 2014-11-24 (×4): qty 1

## 2014-11-24 MED ORDER — SPIRONOLACTONE 25 MG PO TABS
12.5000 mg | ORAL_TABLET | Freq: Every day | ORAL | Status: DC
  Administered 2014-11-24 – 2014-11-25 (×2): 12.5 mg via ORAL
  Filled 2014-11-24 (×2): qty 1

## 2014-11-24 NOTE — Progress Notes (Addendum)
[facility] PROGRESS NOTE  Brenda Marsh QZE:[id number] DOB: 1945/03/15 DOA: 11/23/2014 PCP: [name], MD  Assessment/Plan: 1-syncope/collapse: most likely associated with vertigo and mild dehydration. Other considerations, especially with prolonged QT is arrhythmias and vestibular evaluation by PT -will monitor on telemetry -cardiology consulted -will continue holding lasix; ok to resume spironolactone  -follow 2-D echo and vestibular evaluation by PT  2-chronic combined CHF: s/o defibrillator  -continue low sodium diet -continue coreg and ARB -will resume cozaar -will resume spironolactone  -low sodium diet, daily weights and strict I and O  3-vertigo: will follow vestibular assessment by PT -continue meclizine  4-diet controlled diabetes -will follow A1C -continue modified carb diet  5-HLD: not on statins -will check lipid panel.  6-Essential HTN: -stable -will continue cozaar, coreg and aldactone   Code Status: Full Family Communication: no family at bedside Disposition Plan: monitor on telemetry, follow cardiology consult and vestibular evaluation by EP   Consultants:  Cardiology   Procedures:  2-D echo: pending  Antibiotics:  None   HPI/Subjective: Afebrile, no CP, no SOB. Currently no complaining of dizziness   Objective: Filed Vitals:   11/24/14 0529  BP: 129/52  Pulse: 75  Temp: 98.4 F (36.9 C)  Resp: 16    Intake/Output Summary (Last 24 hours) at 11/24/14 0959 Last data filed at 11/24/14 0926  Gross per 24 hour  Intake    700 ml  Output   1250 ml  Net   -550 ml   Filed Weights   11/23/14 0343 11/23/14 0900 11/24/14 0412  Weight: 102.059 kg (225 lb) 103 kg (227 lb 1.2 oz) 100.8 kg (222 lb 3.6 oz)    Exam:   General:  Afebrile, no CP, no palpitations and currently denying lightheadedness, palpitation and dizziness.  Cardiovascular: S1 and S2, no rubs or gallops; no JVD  Respiratory: CTA bilaterally  Abdomen:  soft. NT, ND, positive BS  Musculoskeletal: trace edema bilaterally, no cyanosis   Data Reviewed: Basic Metabolic Panel:  Recent Labs Lab 11/19/14 1341 11/23/14 0442 11/23/14 1100 11/24/14 0317  NA 138 140  --  141  K 4.2 3.7  --  3.8  CL 105 108  --  106  CO2 20 24  --  26  GLUCOSE 75 105*  --  96  BUN 9 12  --  6  CREATININE 0.71 0.71  --  0.66  CALCIUM 9.2 9.5  --  9.5  MG  --   --  2.0  --   PHOS  --   --  3.8  --    Liver Function Tests:  Recent Labs Lab 11/24/14 0317  AST 23  ALT 17  ALKPHOS 61  BILITOT 0.6  PROT 6.6  ALBUMIN 3.0*   CBC:  Recent Labs Lab 11/23/14 0442 11/24/14 0317  WBC 6.5 5.5  NEUTROABS 3.4  --   HGB 11.3* 10.9*  HCT 33.8* 33.5*  MCV 88.9 89.3  PLT 212 212   Cardiac Enzymes:  Recent Labs Lab 11/23/14 1100  TROPONINI <0.03    Studies: Ct Head Wo Contrast  11/23/2014  CLINICAL DATA:  Lightheadedness.  Vomiting. EXAM: CT HEAD WITHOUT CONTRAST TECHNIQUE: Contiguous axial images were obtained from the base of the skull through the vertex without intravenous contrast. COMPARISON:  06/26/2012 FINDINGS: There is no intracranial hemorrhage, mass or evidence of acute infarction. There is no extra-axial fluid collection. Gray matter and white matter appear normal. Cerebral volume is normal for age. Brainstem and posterior fossa are unremarkable.  The CSF spaces appear normal. The bony structures are intact. The visible portions of the paranasal sinuses are clear. IMPRESSION: Normal brain Electronically Signed   By: Andreas Newport M.D.   On: 11/23/2014 05:11    Scheduled Meds: . carvedilol  12.5 mg Oral BID WC  . cholecalciferol  400 Units Oral Daily  . enoxaparin (LOVENOX) injection  40 mg Subcutaneous Q24H  . fluticasone  2 spray Each Nare Daily  . [START ON 11/25/2014] losartan  25 mg Oral Daily  . meclizine  12.5 mg Oral BID  . multivitamin with minerals  1 tablet Oral Daily  . sodium chloride  3 mL Intravenous Q12H  .  spironolactone  12.5 mg Oral Daily   Continuous Infusions:   Principal Problem:   Vertigo Active Problems:   Hyperlipidemia, mixed   Essential hypertension   GERD   Diabetes mellitus type 2, diet-controlled ([place])   ICD (implantable cardioverter-defibrillator), biventricular, in situ   Prolonged Q-T interval on ECG    Time spent: 35 minutes    Barton Dubois  [facility] Pager [phone]. If 7PM-7AM, please contact night-coverage at [email or web address], password [facility] 11/24/2014, 9:59 AM  LOS: 1 day

## 2014-11-24 NOTE — Progress Notes (Signed)
Addendum  AICD was interrogated on 11/6.  No incidences in the past two weeks.  Patient has had increased PVCs over the last 6 months an average of 60/hour.  Imogene Burn, PA-C [facility] Pager: [phone]

## 2014-11-24 NOTE — Evaluation (Signed)
Physical Therapy Evaluation Patient Details Name: Brenda Marsh MRN: [id number] DOB: [birth year] Today's Date: 11/24/2014   History of Present Illness    69 y.o. female, with mixed systolic and diastolic heart failure, vertigo, HTN, and diet controlled diabetes. She has a history of right breast cancer. She presented to [facility] with nausea, vomiting, vertigo. She reports that she felt poorly on waking yesterday morning, the room was spinning, so she took a meclizine. Later in the morning while sitting in a recliner chair she continued to have symptoms of vertigo and nausea. Neuro workup negative, cardiac workup appears negative.   Clinical Impression  Pt presents with mild to moderate limitations to functional mobility due to dizziness, postural instability and limited tolerance to positional changes.  Vestibular assessment inconclusive due to meclizine onboard masking vestibular response to testing, however suspect pt has acute right BPPV.  Performed Epley maneuver to clear canals, pt subjectively reports improvement in symptoms.  Able to ambulate short distance with support but still mildly unsteady. Provided post-maneuver education (remain upright, avoid supine with head <45 degrees, attempt to sleep on left side), pt verbalizes understanding.   Recommend reassess in am next date, will ask RN to hold meclizine to appreciate true vestibular function.  Discussed plan with current RN.  If pt demonstrates improvement, would recommend referral to OPPT for vestibular rehabilitation and consider asking family to stay with pt for first few days after d/c.  Will update recommendations following reassess in morning as appropriate.  Thank you for this interesting consult.    Follow Up Recommendations Outpatient PT (vestibular rehab, recommend [facility])    Equipment Recommendations  None recommended by PT    Recommendations for Other Services       Precautions / Restrictions  Precautions Precautions: Fall Precaution Comments: up with supervisin Restrictions Other Position/Activity Restrictions: avoid HOB flat, try to sleep on left side      Mobility  Bed Mobility Overal bed mobility: Independent             General bed mobility comments: assist for complex rolling to acheive testing/tx positions for vestib eval  Transfers Overall transfer level: Needs assistance Equipment used: None Transfers: Sit to/from Stand Sit to Stand: Min guard         General transfer comment: standby for safety first time up, slight instability improves over time; pt reports minimal changes to dizziness  Ambulation/Gait Ambulation/Gait assistance: Min assist Ambulation Distance (Feet): 75 Feet Assistive device: None Gait Pattern/deviations: Step-through pattern;Staggering left;Wide base of support     General Gait Details: inconsistent presentation of instabilty to left>right, cues to use visual substitution for improved amb on flat/straight path and to decr instab with turning  Stairs            Wheelchair Mobility    Modified Rankin (Stroke Patients Only)       Balance Overall balance assessment: Needs assistance Sitting-balance support: No upper extremity supported;Feet supported Sitting balance-Leahy Scale: Fair Sitting balance - Comments: sit/supine position changes provoke dizziness espec Lside>up and up>Rside>up   Standing balance support: No upper extremity supported;During functional activity Standing balance-Leahy Scale: Fair Standing balance comment: req external cues for dynamic activity                             Pertinent Vitals/Pain Pain Assessment: No/denies pain (pressure and popping in ear R>L)    Home Living Family/patient expects to be discharged to:: Private residence Living Arrangements:  Alone Available Help at Discharge: Family;Available PRN/intermittently Type of Home: House Home Access: Level entry      Home Layout: Two level Home Equipment: None      Prior Function Level of Independence: Independent               Hand Dominance   Dominant Hand: Right    Extremity/Trunk Assessment   Upper Extremity Assessment: Overall WFL for tasks assessed           Lower Extremity Assessment: Overall WFL for tasks assessed      Cervical / Trunk Assessment: Normal  Communication   Communication: No difficulties  Cognition Arousal/Alertness: Awake/alert Behavior During Therapy: WFL for tasks assessed/performed Overall Cognitive Status: Within Functional Limits for tasks assessed                      General Comments General comments (skin integrity, edema, etc.): see vestibular assess and clinical impression for details of findings    Exercises        Assessment/Plan    PT Assessment Patient needs continued PT services  PT Diagnosis Difficulty walking (vestibular dysfunction)   PT Problem List Impaired sensation;Decreased mobility;Decreased coordination;Decreased balance  PT Treatment Interventions Gait training;Stair training;Therapeutic activities;Therapeutic exercise;Neuromuscular re-education;Patient/family education (vestibular rehab: habituation/adaptation/substitution/[place])   PT Goals (Current goals can be found in the Care Plan section) Acute Rehab PT Goals Patient Stated Goal: get better, no more dizzines PT Goal Formulation: With patient/family Time For Goal Achievement: 12/01/14 Potential to Achieve Goals: Good    Frequency Min 5X/week   Barriers to discharge Decreased caregiver support pt lives alone    Co-evaluation               End of Session Equipment Utilized During Treatment: Gait belt Activity Tolerance: Patient tolerated treatment well Patient left: in chair;with call bell/phone within reach;with family/visitor present Nurse Communication: Mobility status;Precautions (plan for next session)         Time: 1359-1446 PT  Time Calculation (min) (ACUTE ONLY): 47 min   Charges:   PT Evaluation $Initial PT Evaluation Tier I: 1 Procedure PT Treatments $Neuromuscular Re-education: 8-22 mins $Canalith Rep Proc: 8-22 mins   PT G Codes:        Herbie Drape 11/24/2014, 2:57 PM

## 2014-11-24 NOTE — Consult Note (Signed)
Cardiologist:  Crenshaw/Tayor Reason for Consult: Syncope, prolonged QT Referring Physician: [name] is an 69 y.o. female.  HPI:   The patient is a 69 yo female with a history of NISCM, LBBB, HTN, HLD, DM.  She underwent cardiac catheterization in [facility] in March of 2010 for reduced LV function. Ejection fraction was 35%. The left main was normal. The LAD had a proximal 25-30% lesion. The circumflex was normal. The right coronary artery had a 15% lesion. Patient was treated medically. Carotid Dopplers in October 2014 showed a 50-69% right and no left stenosis. Echocardiogram in August 2015 showed an ejection fraction of 25-30% and restrictive filling. Had biventricular ICD October 2015.   She present with syncope and vertigo.  QTC was 525ms yesterday.  Mag 2.0and K 3.8. Interrogation of BiV-ICD reveals normal device function.  No tachy episodes.  More PVCs 02/23/14 to 11/23/14 then in previous period .  None during interrogation.    The patient has a prior history of vertigo and reports taking meclizine on 2 occasions Friday morning because of nausea and vertigo.  She then passed out briefly and then later on that night again awoke with vertigo and took additional meclizine.  She eventually reported to med Center and was transferred here.  Her defibrillator was interrogated and did not show any arrhythmia during the episode of syncope.  There was concern over a prolonged QT noted on EKG.  She is feeling better with less nausea.  She has not had any recurrent syncope and her orthostatics have been negative.  She denies angina.   The patient denies vomiting, fever, chest pain, shortness of breath, orthopnea, PND, cough, congestion, abdominal pain, hematochezia, melena, lower extremity edema, claudication.  Only new med is spironolactone 12.5mg  daily add by Dr Stanford Breed on 9/28.    Past Medical History  Diagnosis Date  . Nonischemic cardiomyopathy ([place])   . LBBB (left bundle  branch block)   . Cancer ([place]) 1999    Breast; lumpectomy, radiation and chemotherapy  . Colon polyp     unclear pathology  . Hyperlipidemia   . Hypertension   . Arthritis     left knee pain, MRI in 2008-tricompartmental  degenerate changes, mucoid degeneration of ACL, post horn meniscal tear and ant horn meniscal tear  . Low back pain     left L3-4 injected  . Unspecified constipation 04/22/2012  . Anemia 07/16/2012  . Presence of permanent cardiac pacemaker   . AICD (automatic cardioverter/defibrillator) present   . Diabetes mellitus type 2, diet-controlled ([place]) 03/11/2009    Qualifier: Diagnosis of  By: Wynona Luna  (boderline )   Past Surgical History  Procedure Laterality Date  . Appendectomy  2009    Dr. Lucia Gaskins  . Carpal tunnel release  2007    Dr. Daylene Katayama  . Breast surgery      lumpectomy  . Tonsillectomy    . Knee arthroscopy      Right  . Bi-ventricular implantable cardioverter defibrillator  (crt-d)  11/16/13    MDT CRTD implanted by Dr Lovena Le for NICM, CHF, and LBBB  . Bi-ventricular implantable cardioverter defibrillator N/A 11/16/2013    Procedure: BI-VENTRICULAR IMPLANTABLE CARDIOVERTER DEFIBRILLATOR  (CRT-D);  Surgeon: Evans Lance, MD;  Location: [facility] CATH LAB;  Service: Cardiovascular;  Laterality: N/A;  . Abdominal hysterectomy      Paritial   Family History  Problem Relation Age of Onset  . Heart attack Mother 67  . Diabetes  Sister   . Diabetes Brother   . Coronary artery disease Other     Female first degree relative <60  . Hyperlipidemia Other   . Hypertension Other   . Cancer Other     prostate, 1st degree relative <50   Social History:  reports that she quit smoking about 44 years ago. She has never used smokeless tobacco. She reports that she does not drink alcohol. Her drug history is not on file.  Allergies:  Allergies  Allergen Reactions  . Contrast Media [Iodinated Diagnostic Agents] Other (See Comments)    N&V  . Lisinopril Other  (See Comments)    REACTION: cough  . Naproxen Sodium Other (See Comments)    REACTION: Breathing difficulty  . Penicillins Other (See Comments)    Has patient had a PCN reaction causing immediate rash, facial/tongue/throat swelling, SOB or lightheadedness with hypotension:  Has patient had a PCN reaction causing severe rash involving mucus membranes or skin necrosis:  Has patient had a PCN reaction that required hospitalization  Has patient had a PCN reaction occurring within the last 10 years: If all of the above answers are "NO", then may proceed with Cephalosporin use.REAC  . Shellfish Allergy Other (See Comments)    Cant breath   . Simvastatin Other (See Comments)    REACTION: muscle aches   Medications:  Scheduled Meds: . carvedilol  12.5 mg Oral BID WC  . cholecalciferol  400 Units Oral Daily  . enoxaparin (LOVENOX) injection  40 mg Subcutaneous Q24H  . fluticasone  2 spray Each Nare Daily  . [START ON 11/25/2014] losartan  25 mg Oral Daily  . meclizine  12.5 mg Oral BID  . multivitamin with minerals  1 tablet Oral Daily  . sodium chloride  3 mL Intravenous Q12H  . spironolactone  12.5 mg Oral Daily   Continuous Infusions:  PRN Meds:.sodium chloride, alum & mag hydroxide-simeth, metoCLOPramide (REGLAN) injection, promethazine, senna-docusate, sodium chloride   Results for orders placed or performed during the hospital encounter of 11/23/14 (from the past 48 hour(s))  CBC with Differential/Platelet     Status: Abnormal   Collection Time: 11/23/14  4:42 AM  Result Value Ref Range   WBC 6.5 4.0 - 10.5 K/uL   RBC 3.80 (L) 3.87 - 5.11 MIL/uL   Hemoglobin 11.3 (L) 12.0 - 15.0 g/dL   HCT 33.8 (L) 36.0 - 46.0 %   MCV 88.9 78.0 - 100.0 fL   MCH 29.7 26.0 - 34.0 pg   MCHC 33.4 30.0 - 36.0 g/dL   RDW 14.5 11.5 - 15.5 %   Platelets 212 150 - 400 K/uL   Neutrophils Relative % 51 %   Neutro Abs 3.4 1.7 - 7.7 K/uL   Lymphocytes Relative 34 %   Lymphs Abs 2.2 0.7 - 4.0 K/uL    Monocytes Relative 8 %   Monocytes Absolute 0.5 0.1 - 1.0 K/uL   Eosinophils Relative 6 %   Eosinophils Absolute 0.4 0.0 - 0.7 K/uL   Basophils Relative 1 %   Basophils Absolute 0.0 0.0 - 0.1 K/uL  Basic metabolic panel     Status: Abnormal   Collection Time: 11/23/14  4:42 AM  Result Value Ref Range   Sodium 140 135 - 145 mmol/L   Potassium 3.7 3.5 - 5.1 mmol/L   Chloride 108 101 - 111 mmol/L   CO2 24 22 - 32 mmol/L   Glucose, Bld 105 (H) 65 - 99 mg/dL   BUN 12 6 -  20 mg/dL   Creatinine, Ser 0.71 0.44 - 1.00 mg/dL   Calcium 9.5 8.9 - 10.3 mg/dL   GFR calc non Af Amer >60 >60 mL/min   GFR calc Af Amer >60 >60 mL/min    Comment: (NOTE) The eGFR has been calculated using the CKD EPI equation. This calculation has not been validated in all clinical situations. eGFR's persistently <60 mL/min signify possible Chronic Kidney Disease.    Anion gap 8 5 - 15  Magnesium     Status: None   Collection Time: 11/23/14 11:00 AM  Result Value Ref Range   Magnesium 2.0 1.7 - 2.4 mg/dL  Phosphorus     Status: None   Collection Time: 11/23/14 11:00 AM  Result Value Ref Range   Phosphorus 3.8 2.5 - 4.6 mg/dL  Troponin I     Status: None   Collection Time: 11/23/14 11:00 AM  Result Value Ref Range   Troponin I <0.03 <0.031 ng/mL    Comment:        NO INDICATION OF MYOCARDIAL INJURY.   Urinalysis, Routine w reflex microscopic (not at [facility])     Status: Abnormal   Collection Time: 11/23/14  4:59 PM  Result Value Ref Range   Color, Urine YELLOW YELLOW   APPearance CLOUDY (A) CLEAR   Specific Gravity, Urine 1.016 1.005 - 1.030   pH 6.0 5.0 - 8.0   Glucose, UA NEGATIVE NEGATIVE mg/dL   Hgb urine dipstick NEGATIVE NEGATIVE   Bilirubin Urine NEGATIVE NEGATIVE   Ketones, ur NEGATIVE NEGATIVE mg/dL   Protein, ur NEGATIVE NEGATIVE mg/dL   Urobilinogen, UA 0.2 0.0 - 1.0 mg/dL   Nitrite NEGATIVE NEGATIVE   Leukocytes, UA SMALL (A) NEGATIVE  Urine microscopic-add on     Status: None    Collection Time: 11/23/14  4:59 PM  Result Value Ref Range   Squamous Epithelial / LPF RARE RARE   WBC, UA 3-6 <3 WBC/hpf   RBC / HPF 0-2 <3 RBC/hpf   Urine-Other AMORPHOUS URATES/PHOSPHATES   CBC     Status: Abnormal   Collection Time: 11/24/14  3:17 AM  Result Value Ref Range   WBC 5.5 4.0 - 10.5 K/uL   RBC 3.75 (L) 3.87 - 5.11 MIL/uL   Hemoglobin 10.9 (L) 12.0 - 15.0 g/dL   HCT 33.5 (L) 36.0 - 46.0 %   MCV 89.3 78.0 - 100.0 fL   MCH 29.1 26.0 - 34.0 pg   MCHC 32.5 30.0 - 36.0 g/dL   RDW 14.9 11.5 - 15.5 %   Platelets 212 150 - 400 K/uL  Comprehensive metabolic panel     Status: Abnormal   Collection Time: 11/24/14  3:17 AM  Result Value Ref Range   Sodium 141 135 - 145 mmol/L   Potassium 3.8 3.5 - 5.1 mmol/L   Chloride 106 101 - 111 mmol/L   CO2 26 22 - 32 mmol/L   Glucose, Bld 96 65 - 99 mg/dL   BUN 6 6 - 20 mg/dL   Creatinine, Ser 0.66 0.44 - 1.00 mg/dL   Calcium 9.5 8.9 - 10.3 mg/dL   Total Protein 6.6 6.5 - 8.1 g/dL   Albumin 3.0 (L) 3.5 - 5.0 g/dL   AST 23 15 - 41 U/L   ALT 17 14 - 54 U/L   Alkaline Phosphatase 61 38 - 126 U/L   Total Bilirubin 0.6 0.3 - 1.2 mg/dL   GFR calc non Af Amer >60 >60 mL/min   GFR calc Af Amer >60 >  60 mL/min    Comment: (NOTE) The eGFR has been calculated using the CKD EPI equation. This calculation has not been validated in all clinical situations. eGFR's persistently <60 mL/min signify possible Chronic Kidney Disease.    Anion gap 9 5 - 15  TSH     Status: None   Collection Time: 11/24/14  7:02 AM  Result Value Ref Range   TSH 1.117 0.350 - 4.500 uIU/mL    Ct Head Wo Contrast  11/23/2014  CLINICAL DATA:  Lightheadedness.  Vomiting. EXAM: CT HEAD WITHOUT CONTRAST TECHNIQUE: Contiguous axial images were obtained from the base of the skull through the vertex without intravenous contrast. COMPARISON:  06/26/2012 FINDINGS: There is no intracranial hemorrhage, mass or evidence of acute infarction. There is no extra-axial fluid  collection. Gray matter and white matter appear normal. Cerebral volume is normal for age. Brainstem and posterior fossa are unremarkable. The CSF spaces appear normal. The bony structures are intact. The visible portions of the paranasal sinuses are clear. IMPRESSION: Normal brain Electronically Signed   By: Andreas Newport M.D.   On: 11/23/2014 05:11    Review of Systems  Constitutional: Negative for fever and diaphoresis.  HENT: Negative for congestion and sore throat.   Respiratory: Negative for cough, shortness of breath and wheezing.   Cardiovascular: Negative for chest pain, palpitations, orthopnea, claudication, leg swelling and PND.  Gastrointestinal: Positive for nausea. Negative for vomiting, abdominal pain, blood in stool and melena.  Genitourinary: Negative for hematuria.  Musculoskeletal: Negative for myalgias.  Neurological: Positive for dizziness and loss of consciousness.  All other systems reviewed and are negative.  Blood pressure 129/52, pulse 75, temperature 98.4 F (36.9 C), temperature source Oral, resp. rate 16, height $RemoveBe'5\' 5"'AAjKvImmY$  (1.651 m), weight 222 lb 3.6 oz (100.8 kg), SpO2 98 %. Physical Exam  Nursing note and vitals reviewed. Constitutional: She is oriented to person, place, and time. She appears well-developed. No distress.  Obese  HENT:  Head: Normocephalic and atraumatic.  Eyes: EOM are normal. Pupils are equal, round, and reactive to light. No scleral icterus.  Neck: Normal range of motion. Neck supple.  Cardiovascular: Normal rate, regular rhythm, S1 normal and S2 normal.   No murmur heard. Pulses:      Radial pulses are 2+ on the right side, and 2+ on the left side.       Dorsalis pedis pulses are 2+ on the right side, and 2+ on the left side.  Respiratory: Effort normal and breath sounds normal. She has no wheezes. She has no rales.  GI: Soft. Bowel sounds are normal. There is no tenderness.  Musculoskeletal: She exhibits no edema.  Lymphadenopathy:      She has no cervical adenopathy.  Neurological: She is alert and oriented to person, place, and time. She exhibits normal muscle tone.  Skin: Skin is warm and dry.  Psychiatric: She has a normal mood and affect.    Assessment/Plan: Principal Problem:   Vertigo Active Problems:   Hyperlipidemia, mixed   Essential hypertension   GERD   Diabetes mellitus type 2, diet-controlled ([place])   ICD (implantable cardioverter-defibrillator), biventricular, in situ   Prolonged Q-T interval on ECG   69 yo female with a history of NISCM,  LBBB, HTN, HLD, DM. Echocardiogram in August 2015 showed an ejection fraction of 25-30% and restrictive filling. Had biventricular ICD October 2015. She presents with syncope, nausea and vertigo.   Interrogation of BiV-ICD reveals normal device function.  No tachy episodes.  More  PVCs 02/23/14 to 11/23/14 then in previous period .  QTC was 552ms yesterday.  In Feb it was 516ms.  Mag and K WNL.   Only new med is aldactone 12.5 which does not prolong QTc. She's been on furosemide $RemoveBefor'20mg'ccgqXUvarpUy$  daily for over a year.  This can prolong the QT but K is WNL.  It was held.    Recheck EKG now.    Tarri Fuller, [place] 11/24/2014, 10:18 AM   Patient seen and examined.  History was updated as in the above note.  Complete chart reviewed.  1.  I think this is predominantly vertigo.  I do not think there was any cardiac basis for syncope here. 2.  Prolonged QT interval in a patient with a paced rhythm.  IV conduction delay as well as pacing can prolong the QT and this needs no further intervention.  She has a defibrillator so the prolong QT is a moot point anyway.  This has no relation to her previous episode of syncope as there was no defibrillator activity 3.  Nonischemic cardiomyopathy 4.  Functioning biventricular defibrillator  Recommendations:  No additional cardiovascular workup is necessary.  Continue her current medications and may discharge her leisure. It may be worthwhile getting  an ENT doctor to see her if she has recurrent episodes of vertigo.  Kerry Hough. MD [facility] 11/24/2014 12:26 PM

## 2014-11-24 NOTE — Plan of Care (Signed)
Problem: Acute Rehab PT Goals(only PT should resolve) Goal: Pt/caregiver will Perform Home Exercise Program Vestibular exercise as appropriate to include habituation, gaze stabilization, adaptation/substitution

## 2014-11-25 ENCOUNTER — Inpatient Hospital Stay (HOSPITAL_COMMUNITY): Payer: PPO

## 2014-11-25 DIAGNOSIS — E782 Mixed hyperlipidemia: Secondary | ICD-10-CM

## 2014-11-25 DIAGNOSIS — K219 Gastro-esophageal reflux disease without esophagitis: Secondary | ICD-10-CM

## 2014-11-25 DIAGNOSIS — I509 Heart failure, unspecified: Secondary | ICD-10-CM

## 2014-11-25 DIAGNOSIS — R42 Dizziness and giddiness: Secondary | ICD-10-CM | POA: Insufficient documentation

## 2014-11-25 DIAGNOSIS — R55 Syncope and collapse: Secondary | ICD-10-CM | POA: Insufficient documentation

## 2014-11-25 DIAGNOSIS — I429 Cardiomyopathy, unspecified: Secondary | ICD-10-CM

## 2014-11-25 LAB — BASIC METABOLIC PANEL
Anion gap: 9 (ref 5–15)
BUN: 8 mg/dL (ref 6–20)
CHLORIDE: 106 mmol/L (ref 101–111)
CO2: 24 mmol/L (ref 22–32)
CREATININE: 0.75 mg/dL (ref 0.44–1.00)
Calcium: 9.5 mg/dL (ref 8.9–10.3)
GFR calc Af Amer: >60 mL/min (ref >60)
GFR calc non Af Amer: >60 mL/min (ref >60)
GLUCOSE: 105 mg/dL — AB (ref 65–99)
Potassium: 4 mmol/L (ref 3.5–5.1)
SODIUM: 139 mmol/L (ref 135–145)

## 2014-11-25 LAB — LIPID PANEL
Cholesterol: 236 mg/dL — ABNORMAL HIGH (ref 0–200)
HDL: 35 mg/dL — ABNORMAL LOW (ref >40)
LDL CALC: 172 mg/dL — AB (ref 0–99)
Total CHOL/HDL Ratio: 6.7 RATIO
Triglycerides: 144 mg/dL (ref <150)
VLDL: 29 mg/dL (ref 0–40)

## 2014-11-25 LAB — CBC
HEMATOCRIT: 36 % (ref 36.0–46.0)
Hemoglobin: 11.8 g/dL — ABNORMAL LOW (ref 12.0–15.0)
MCH: 29.6 pg (ref 26.0–34.0)
MCHC: 32.8 g/dL (ref 30.0–36.0)
MCV: 90.2 fL (ref 78.0–100.0)
PLATELETS: 208 10*3/uL (ref 150–400)
RBC: 3.99 MIL/uL (ref 3.87–5.11)
RDW: 14.8 % (ref 11.5–15.5)
WBC: 5.5 10*3/uL (ref 4.0–10.5)

## 2014-11-25 MED ORDER — ONDANSETRON 8 MG PO TBDP: 8.0000 mg | ORAL_TABLET | Freq: Three times a day (TID) | ORAL | Status: DC | PRN

## 2014-11-25 MED ORDER — MECLIZINE HCL 12.5 MG PO TABS: 12.5000 mg | ORAL_TABLET | Freq: Two times a day (BID) | ORAL | Status: DC

## 2014-11-25 NOTE — Progress Notes (Signed)
Echocardiogram 2D Echocardiogram has been performed.  Brenda Marsh 11/25/2014, 9:55 AM

## 2014-11-25 NOTE — Care Management Note (Signed)
Case Management Note  Patient Details  Name: Brenda Marsh MRN: [id number] Date of Birth: 09-Apr-1945  Subjective/Objective:                 Independent patient from home admitted with syncope. Referral made for outpatient rehab. Patient received map and contact number with instructions to call in 3 days if she does not receive call to schedule first appointment. Patient denies any other CM assistance.    Action/Plan:  Anticipate DC to home today.   Expected Discharge Date:                  Expected Discharge Plan:  Home/Self Care  In-House Referral:     Discharge planning Services  CM Consult  Post Acute Care Choice:    Choice offered to:     DME Arranged:    DME Agency:     HH Arranged:    [place] Agency:     Status of Service:  Completed, signed off  Medicare Important Message Given:    Date Medicare IM Given:    Medicare IM give by:    Date Additional Medicare IM Given:    Additional Medicare Important Message give by:     If discussed at [place] of Stay Meetings, dates discussed:    Additional Comments:  Carles Collet, RN 11/25/2014, 11:10 AM

## 2014-11-25 NOTE — Progress Notes (Signed)
Physical Therapy Treatment Patient Details Name: Brenda Marsh MRN: [id number] DOB: 02-02-1945 Today's Date: 11/25/2014    History of Present Illness 69 y.o. female, with mixed systolic and diastolic heart failure, vertigo, HTN, and diet controlled diabetes. She has a history of right breast cancer. She presented to [facility] with nausea, vomiting, vertigo. She reports that she felt poorly on waking yesterday morning, the room was spinning, so she took a meclizine. Later in the morning while sitting in a recliner chair she continued to have symptoms of vertigo and nausea. Neuro workup negative, cardiac workup appears negative.    PT Comments    Patient continues with about 1/10 baseline dizziness (spinning,) but feels improved and able to complete DGI as well as stairs today.  Still may have some positional component in addition to hypofunction.  Continues with popping right ear with position changes as well. Encouraged only going out with a friend due to issues with balance in community setting (high fall risk per DGI 16/24).  Will follow up if not d/c today and will need outpatient vestibular PT follow up.    Follow Up Recommendations  Outpatient PT (vestibular rehab)     Equipment Recommendations  None recommended by PT    Recommendations for Other Services       Precautions / Restrictions Precautions Precautions: Fall Restrictions Weight Bearing Restrictions: No    Mobility  Bed Mobility Overal bed mobility: Modified Independent             General bed mobility comments: but assist wtih rolling/etc with hall pike and Eply's  Transfers Overall transfer level: Needs assistance Equipment used: None Transfers: Sit to/from Stand Sit to Stand: Modified independent (Device/Increase time)         General transfer comment: up from chair with armrests no assist  Ambulation/Gait Ambulation/Gait assistance: Min guard;Supervision Ambulation Distance (Feet): 250 Feet    Gait Pattern/deviations: Step-through pattern;Decreased stride length;Drifts right/left     General Gait Details: veers to left when looking from up to down during DGI; LOB with turn and stop.  Patient cued for visual substitution with turns especially   Stairs Stairs: Yes Stairs assistance: Min assist Stair Management: Forwards;One rail Right Number of Stairs: 10 General stair comments: cues for gaze ahead on stationary target for descent with improved tolerance, dizziness reported with ascending steps; assist for safety/security  Wheelchair Mobility    Modified Rankin (Stroke Patients Only)       Balance                                    Cognition Arousal/Alertness: Awake/alert Behavior During Therapy: WFL for tasks assessed/performed Overall Cognitive Status: Within Functional Limits for tasks assessed                      Exercises Other Exercises Other Exercises: Patient educated in seated x 1 viewing exercised with demonstration, written instructions and review.  Patient verbalized understanding    General Comments General comments (skin integrity, edema, etc.): performed dix hall pike head turned right mild short lived rotary nystagmus.  Reports dizziness with each turn during Eply maneuver worse when rolling to left.  No further nystagmus noted.    11/25/14 1049  Standardized Balance Assessment  Standardized Balance Assessment  Dynamic Gait Index  Dynamic Gait Index  Level Surface 3  Change in Gait Speed 2  Gait with Horizontal Head Turns 3  Gait with Vertical Head Turns 1  Gait and Pivot Turn 1  Step Over Obstacle 2  Step Around Obstacles 3  Steps 1  Total Score 16            Pertinent Vitals/Pain Pain Assessment: No/denies pain    Home Living                      Prior Function            PT Goals (current goals can now be found in the care plan section) Progress towards PT goals: Progressing toward goals     Frequency  Min 3X/week    PT Plan Current plan remains appropriate    Co-evaluation             End of Session   Activity Tolerance: Patient tolerated treatment well Patient left: in bed;with call bell/phone within reach     Time: 1000-1039 PT Time Calculation (min) (ACUTE ONLY): 39 min  Charges:  $Gait Training: 8-22 mins $Neuromuscular Re-education: 8-22 mins $Canalith Rep Proc: 8-22 mins                    G Codes:      Cordai Rodrigue,CYNDI 12-14-2014, 10:53 AM  Magda Kiel, PT [phone] 2014-12-14

## 2014-11-25 NOTE — Discharge Summary (Signed)
Physician Discharge Summary  RYONNA CIMINI VEH:[id number] DOB: July 19, 1945 DOA: 11/23/2014  PCP: [name], MD  Admit date: 11/23/2014 Discharge date: 11/25/2014  Time spent: 35 minutes  Recommendations for Outpatient Follow-up:  1. Repeat BMET to follow electrolytes and renal function 2. Please reassess BP and adjust antihypertensive regimen as needed  3. Follow A1C (pending at discharge)  Discharge Diagnoses:  Principal Problem:   Vertigo, benign positional Active Problems:   Hyperlipidemia, mixed   Essential hypertension   GERD   Diabetes mellitus type 2, diet-controlled ([place])   ICD (implantable cardioverter-defibrillator), biventricular, in situ   Nonischemic cardiomyopathy ([place])   Vertigo   Faintness   Discharge Condition: stable and improved. Discharge home with referral to neurorehab center for vestibular training. Patient will arrange follow up with PCP in 10 days.  Diet recommendation: heart healthy and modified carb diet  Filed Weights   11/23/14 0900 11/24/14 0412 11/25/14 0645  Weight: 103 kg (227 lb 1.2 oz) 100.8 kg (222 lb 3.6 oz) 103.1 kg (227 lb 4.7 oz)    History of present illness:  69 y.o. female, with mixed systolic and diastolic heart failure, vertigo, HTN, and diet controlled diabetes. She has a history of right breast cancer. She presented to [facility] with nausea, vomiting, vertigo. She reports that she felt poorly on waking yesterday morning, the room was spinning, so she took a meclizine. Later in the morning while sitting in a recliner chair she continued to have symptoms of vertigo and nausea. She reached for something and blacked out momentarily. She pressed on throughout the rest of the day and was functional. She went to bed at evening and awoke at 2 AM this morning with severe vomiting that lasted over an hour. She then called EMS and went to [facility]. Mrs. Reicks reports that she has had one other episode of severe vertigo that  occurred approximately one year ago. She has 1 new medication, spironolactone 12.5 mg that was started 2 or 3 weeks ago by Dr. Stanford Breed. She denies any fever, cough, chest pain, palpitations, abdominal pain, diarrhea, dysuria. She complains of fullness in her head and mild congestion.  Hospital Course:  1-syncope/collapse: most likely associated with vertigo and mild dehydration. Other considerations, especially with prolonged QT, arrhythmias and vestibular evaluation by PT -no major abnormalities on telemetry  -cardiology consult appreciated; no further cardiovascular work up recommended. Ok to continue current medication regimen, pacemaker/defibrillator working as intended.  -will resume at discharge lasix and spironolactone  -outpatient referral for vestibular training -continue meclizine   2-chronic combined CHF: s/p defibrillator  -continue low sodium diet -continue coreg and ARB -will resume spironolactone and lasix -daily weights   3-vertigo:  -will follow up in outpatient rehab center for vestibular training  -continue meclizine BID  4-diet controlled diabetes -A1C pending at discharge; but CBG's well controlled -continue modified carb diet  5-HLD: not on statins -LDL stable -advise to follow low fat diet  6-Essential HTN: -stable -will continue cozaar, coreg and aldactone   Procedures:  2-D Echo: -Left ventricle: The cavity size was normal. Wall thickness was normal. LVEF is approximately 40% with hypokinesis worse in the inferior/inferoseptal walls. Systolic function was moderately reduced. The estimated ejection fraction was in the range of 35% to 40%. Doppler parameters are consistent with abnormal left ventricular relaxation (grade 1 diastolic dysfunction).  Consultations:  Cardiology   Discharge Exam: Filed Vitals:   11/25/14 1323  BP: 112/53  Pulse: 84  Temp: 98.4 F (36.9 C)  Resp: 18    General: Afebrile, no CP, no palpitations and  currently denying lightheadedness, palpitation and dizziness.  Cardiovascular: S1 and S2, no rubs or gallops; no JVD  Respiratory: CTA bilaterally  Abdomen: soft. NT, ND, positive BS  Musculoskeletal: trace edema bilaterally, no cyanosis   Discharge Instructions   Discharge Instructions    Diet - low sodium heart healthy    Complete by:  As directed      Discharge instructions    Complete by:  As directed   Take medications as prescribed  Please follow heart healthy diet (low sodium, less than 2 gram sodium daily) Keep yourself well hydrated Arrange follow up with PCP in 10 days Follow with outpatient referral for vestibular training          Current Discharge Medication List    START taking these medications   Details  ondansetron (ZOFRAN ODT) 8 MG disintegrating tablet Take 1 tablet (8 mg total) by mouth every 8 (eight) hours as needed for nausea or vomiting. Qty: 20 tablet, Refills: 0      CONTINUE these medications which have CHANGED   Details  meclizine (ANTIVERT) 12.5 MG tablet Take 1 tablet (12.5 mg total) by mouth 2 (two) times daily.  Qty: 60 tablet, Refills: 1      CONTINUE these medications which have NOT CHANGED   Details  carvedilol (COREG) 12.5 MG tablet Take 1 tablet (12.5 mg total) by mouth 2 (two) times daily with a meal. Qty: 180 tablet, Refills: 3    Cholecalciferol (VITAMIN D) 400 UNITS capsule Take 400 Units by mouth daily.    furosemide (LASIX) 20 MG tablet Take 1 tablet (20 mg total) by mouth daily. Qty: 30 tablet, Refills: 5    losartan (COZAAR) 50 MG tablet Take 1 tablet (50 mg total) by mouth daily. Qty: 90 tablet, Refills: 3    Multiple Vitamin (MULTIVITAMIN) capsule Take 1 capsule by mouth daily.    spironolactone (ALDACTONE) 25 MG tablet Take 0.5 tablets (12.5 mg total) by mouth daily. Qty: 45 tablet, Refills: 3   Associated Diagnoses: Chronic systolic CHF (congestive heart failure) (HCC)    aspirin EC 81 MG tablet Take 1 tablet  (81 mg total) by mouth daily. Qty: 90 tablet, Refills: 3        Follow-up Information    Follow up with [name], MD. Schedule an appointment as soon as possible for a visit in 10 days.   Specialty:  Family Medicine   Contact information:   [place] [address] [phone]        The results of significant diagnostics from this hospitalization (including imaging, microbiology, ancillary and laboratory) are listed below for reference.    Significant Diagnostic Studies: Ct Head Wo Contrast  11/23/2014  CLINICAL DATA:  Lightheadedness.  Vomiting. EXAM: CT HEAD WITHOUT CONTRAST TECHNIQUE: Contiguous axial images were obtained from the base of the skull through the vertex without intravenous contrast. COMPARISON:  06/26/2012 FINDINGS: There is no intracranial hemorrhage, mass or evidence of acute infarction. There is no extra-axial fluid collection. Gray matter and white matter appear normal. Cerebral volume is normal for age. Brainstem and posterior fossa are unremarkable. The CSF spaces appear normal. The bony structures are intact. The visible portions of the paranasal sinuses are clear. IMPRESSION: Normal brain Electronically Signed   By: Andreas Newport M.D.   On: 11/23/2014 05:11    Microbiology: Recent Results (from the past 240 hour(s))  Urine culture  Status: None   Collection Time: 11/23/14  4:59 PM  Result Value Ref Range Status   Specimen Description URINE, RANDOM  Final   Special Requests NONE  Final   Culture MULTIPLE SPECIES PRESENT, SUGGEST RECOLLECTION  Final   Report Status 11/24/2014 FINAL  Final     Labs: Basic Metabolic Panel:  Recent Labs Lab 11/19/14 1341 11/23/14 0442 11/23/14 1100 11/24/14 0317 11/25/14 0607  NA 138 140  --  141 139  K 4.2 3.7  --  3.8 4.0  CL 105 108  --  106 106  CO2 20 24  --  26 24  GLUCOSE 75 105*  --  96 105*  BUN 9 12  --  6 8  CREATININE 0.71 0.71  --  0.66 0.75  CALCIUM 9.2 9.5   --  9.5 9.5  MG  --   --  2.0  --   --   PHOS  --   --  3.8  --   --    Liver Function Tests:  Recent Labs Lab 11/24/14 0317  AST 23  ALT 17  ALKPHOS 61  BILITOT 0.6  PROT 6.6  ALBUMIN 3.0*   CBC:  Recent Labs Lab 11/23/14 0442 11/24/14 0317 11/25/14 0607  WBC 6.5 5.5 5.5  NEUTROABS 3.4  --   --   HGB 11.3* 10.9* 11.8*  HCT 33.8* 33.5* 36.0  MCV 88.9 89.3 90.2  PLT 212 212 208   Cardiac Enzymes:  Recent Labs Lab 11/23/14 1100  TROPONINI <0.03    Signed:  Barton Dubois  [facility] 11/25/2014, 2:33 PM

## 2014-11-25 NOTE — Progress Notes (Signed)
NURSING PROGRESS NOTE  Brenda Marsh [id number] Discharge Data: 11/25/2014 2:36 PM Attending Provider: Barton Dubois, MD [id number], Erline Levine, MD     [name] to be D/C'd Home per MD order.  Discussed with the patient the After Visit Summary and all questions fully answered. All IV's discontinued with no bleeding noted. All belongings returned to patient for patient to take home.   Last Vital Signs:  Blood pressure 112/53, pulse 84, temperature 98.4 F (36.9 C), temperature source Oral, resp. rate 18, height 5\' 5"  (1.651 m), weight 103.1 kg (227 lb 4.7 oz), SpO2 100 %.  Discharge Medication List   Medication List    TAKE these medications        aspirin EC 81 MG tablet  Take 1 tablet (81 mg total) by mouth daily.     carvedilol 12.5 MG tablet  Commonly known as:  COREG  Take 1 tablet (12.5 mg total) by mouth 2 (two) times daily with a meal.     furosemide 20 MG tablet  Commonly known as:  LASIX  Take 1 tablet (20 mg total) by mouth daily.     losartan 50 MG tablet  Commonly known as:  COZAAR  Take 1 tablet (50 mg total) by mouth daily.     meclizine 12.5 MG tablet  Commonly known as:  ANTIVERT  Take 1 tablet (12.5 mg total) by mouth 2 (two) times daily. TAKE 1 TABLET BY MOUTH THREE TIMES DAILY AS NEEDED FOR DIZZINESS OR NAUSEA     multivitamin capsule  Take 1 capsule by mouth daily.     ondansetron 8 MG disintegrating tablet  Commonly known as:  ZOFRAN ODT  Take 1 tablet (8 mg total) by mouth every 8 (eight) hours as needed for nausea or vomiting.     spironolactone 25 MG tablet  Commonly known as:  ALDACTONE  Take 0.5 tablets (12.5 mg total) by mouth daily.     Vitamin D 400 UNITS capsule  Take 400 Units by mouth daily.         Charolette Child, RN

## 2014-11-25 NOTE — Patient Instructions (Signed)
Gaze Stabilization: Sitting    Keeping eyes on target on wall 5-10 feet away, tilt head down 15-30 and move head side to side for __30-60__ seconds. Repeat while moving head up and down for _30-60___ seconds. Do __4__ sessions per day.  Copyright  VHI. All rights reserved.   When feels easy; progress to:  Gaze Stabilization: Standing Feet Apart    Feet shoulder width apart, keeping eyes on target on wall _5-10___ feet away, tilt head down 15-30 and move head side to side for __30-60__ seconds. Repeat while moving head up and down for _30-60___ seconds. Do __4__ sessions per day. Do while standing next to a counter for safety.  Copyright  VHI. All rights reserved.  Gaze Stabilization: Tip Card  1.Target must remain in focus, not blurry, and appear stationary while head is in motion. 2.Perform exercises with small head movements (45 to either side of midline). 3.Increase speed of head motion so long as target is in focus. 4.If you wear eyeglasses, be sure you can see target through lens (therapist will give specific instructions for bifocal / progressive lenses). 5.These exercises may provoke dizziness or nausea. Work through these symptoms. If too dizzy, slow head movement slightly. Rest between each exercise. 6.Exercises demand concentration; avoid distractions. 7.For safety, perform standing exercises close to a counter, wall, corner, or next to someone.  Copyright  VHI. All rights reserved.

## 2014-11-26 ENCOUNTER — Ambulatory Visit (INDEPENDENT_AMBULATORY_CARE_PROVIDER_SITE_OTHER): Payer: PPO | Admitting: Physician Assistant

## 2014-11-26 ENCOUNTER — Encounter: Payer: Self-pay | Admitting: Physician Assistant

## 2014-11-26 ENCOUNTER — Telehealth: Payer: Self-pay | Admitting: Family Medicine

## 2014-11-26 VITALS — BP 120/62 | HR 78 | Temp 98.1°F | Resp 16 | Ht 66.0 in | Wt 227.1 lb

## 2014-11-26 DIAGNOSIS — I10 Essential (primary) hypertension: Secondary | ICD-10-CM

## 2014-11-26 DIAGNOSIS — H8110 Benign paroxysmal vertigo, unspecified ear: Secondary | ICD-10-CM | POA: Diagnosis not present

## 2014-11-26 LAB — BASIC METABOLIC PANEL
BUN: 12 mg/dL (ref 6–23)
CO2: 28 mEq/L (ref 19–32)
CREATININE: 0.75 mg/dL (ref 0.40–1.20)
Calcium: 10.1 mg/dL (ref 8.4–10.5)
Chloride: 104 mEq/L (ref 96–112)
GFR: 98.42 mL/min (ref >60.00)
Glucose, Bld: 79 mg/dL (ref 70–99)
Potassium: 3.8 mEq/L (ref 3.5–5.1)
Sodium: 140 mEq/L (ref 135–145)

## 2014-11-26 LAB — HEMOGLOBIN A1C
Hgb A1c MFr Bld: 5.9 % — ABNORMAL HIGH (ref 4.8–5.6)
MEAN PLASMA GLUCOSE: 123 mg/dL

## 2014-11-26 NOTE — Telephone Encounter (Signed)
Relation to UY:ZJQD Call back [phone]   Reason for call:  Patient inquiring about lab results, patient stated she is returning your call but do not see call noted, please advise

## 2014-11-26 NOTE — Patient Instructions (Signed)
Please go to the lab for blood work. I will call you with your results. Please continue medications as directed. Continue daily weights to keep an eye on your heart failure. You will be contacted by ENT for assessment. Please call the physical therapy department to schedule your rehabilitation.  Epley Maneuver Self-Care WHAT IS THE EPLEY MANEUVER? The Epley maneuver is an exercise you can do to relieve symptoms of benign paroxysmal positional vertigo (BPPV). This condition is often just referred to as vertigo. BPPV is caused by the movement of tiny crystals (canaliths) inside your inner ear. The accumulation and movement of canaliths in your inner ear causes a sudden spinning sensation (vertigo) when you move your head to certain positions. Vertigo usually lasts about 30 seconds. BPPV usually occurs in just one ear. If you get vertigo when you lie on your left side, you probably have BPPV in your left ear. Your health care provider can tell you which ear is involved.  BPPV may be caused by a head injury. Many people older than 50 get BPPV for unknown reasons. If you have been diagnosed with BPPV, your health care provider may teach you how to do this maneuver. BPPV is not life threatening (benign) and usually goes away in time.  WHEN SHOULD I PERFORM THE EPLEY MANEUVER? You can do this maneuver at home whenever you have symptoms of vertigo. You may do the Epley maneuver up to 3 times a day until your symptoms of vertigo go away. HOW SHOULD I DO THE EPLEY MANEUVER? 1. Sit on the edge of a bed or table with your back straight. Your legs should be extended or hanging over the edge of the bed or table.  2. Turn your head halfway toward the affected ear.  3. Lie backward quickly with your head turned until you are lying flat on your back. You may want to position a pillow under your shoulders.  4. Hold this position for 30 seconds. You may experience an attack of vertigo. This is normal. Hold this  position until the vertigo stops. 5. Then turn your head to the opposite direction until your unaffected ear is facing the floor.  6. Hold this position for 30 seconds. You may experience an attack of vertigo. This is normal. Hold this position until the vertigo stops. 7. Now turn your whole body to the same side as your head. Hold for another 30 seconds.  8. You can then sit back up. ARE THERE RISKS TO THIS MANEUVER? In some cases, you may have other symptoms (such as changes in your vision, weakness, or numbness). If you have these symptoms, stop doing the maneuver and call your health care provider. Even if doing these maneuvers relieves your vertigo, you may still have dizziness. Dizziness is the sensation of light-headedness but without the sensation of movement. Even though the Epley maneuver may relieve your vertigo, it is possible that your symptoms will return within 5 years. WHAT SHOULD I DO AFTER THIS MANEUVER? After doing the Epley maneuver, you can return to your normal activities. Ask your doctor if there is anything you should do at home to prevent vertigo. This may include:  Sleeping with two or more pillows to keep your head elevated.  Not sleeping on the side of your affected ear.  Getting up slowly from bed.  Avoiding sudden movements during the day.  Avoiding extreme head movement, like looking up or bending over.  Wearing a cervical collar to prevent sudden head movements. WHAT  SHOULD I DO IF MY SYMPTOMS GET WORSE? Call your health care provider if your vertigo gets worse. Call your provider right way if you have other symptoms, including:   Nausea.  Vomiting.  Headache.  Weakness.  Numbness.  Vision changes.   This information is not intended to replace advice given to you by your health care provider. Make sure you discuss any questions you have with your health care provider.   Document Released: 01/09/2013 Document Reviewed: 01/09/2013 Elsevier  Interactive Patient Education [employer].

## 2014-11-26 NOTE — Progress Notes (Signed)
Pre visit review using our clinic review tool, if applicable. No additional management support is needed unless otherwise documented below in the visit note/SLS  

## 2014-12-01 DIAGNOSIS — H8110 Benign paroxysmal vertigo, unspecified ear: Secondary | ICD-10-CM | POA: Insufficient documentation

## 2014-12-01 NOTE — Progress Notes (Signed)
Patient presents to clinic today for hospital follow-up. Patient admitted to hospital on 11/23/2014 for further assessment after presenting to ER with nausea, vomiting and severe vertigo. Had also had episode of syncope. Hospital workup performed to rule acute cardiovascular or neurological cause of symptoms and episode. For full history, please review EMR. EMR reviewed in detail by this provider at time of visit. Patient discharged on previous medication regimen and given meclizine for BPPV. Vestibular rehabilitation was ordered for patient.  Since discharge, patient states she is doing much better. Is still having positional vertigo alleviated with her Meclizine but is much less severe. Denies recurrence of syncope. Has been taking medications as directed. Endorses weight is stable. Denies leg swelling or SOB. Has not yet been seen for rehabilitation. IS requesting referral to ENT. Denies new or worsening symptoms.  Past Medical History  Diagnosis Date  . Nonischemic cardiomyopathy ([place])   . LBBB (left bundle branch block)   . History of breast cancer in female 1999    Breast; lumpectomy, radiation and chemotherapy  . Colon polyp     unclear pathology  . Hyperlipidemia   . Hypertension   . Arthritis     left knee pain, MRI in 2008-tricompartmental  degenerate changes, mucoid degeneration of ACL, post horn meniscal tear and ant horn meniscal tear  . Low back pain     left L3-4 injected  . Unspecified constipation 04/22/2012  . Anemia 07/16/2012  . Presence of permanent cardiac pacemaker   . AICD (automatic cardioverter/defibrillator) present   . Diabetes mellitus type 2, diet-controlled ([place]) 03/11/2009    Qualifier: Diagnosis of  By: Wynona Luna  (boderline )    Current Outpatient Prescriptions on File Prior to Visit  Medication Sig Dispense Refill  . carvedilol (COREG) 12.5 MG tablet Take 1 tablet (12.5 mg total) by mouth 2 (two) times daily with a meal. 180 tablet 3  .  Cholecalciferol (VITAMIN D) 400 UNITS capsule Take 400 Units by mouth daily.    . furosemide (LASIX) 20 MG tablet Take 1 tablet (20 mg total) by mouth daily. 30 tablet 5  . losartan (COZAAR) 50 MG tablet Take 1 tablet (50 mg total) by mouth daily. 90 tablet 3  . meclizine (ANTIVERT) 12.5 MG tablet Take 1 tablet (12.5 mg total) by mouth 2 (two) times daily. TAKE 1 TABLET BY MOUTH THREE TIMES DAILY AS NEEDED FOR DIZZINESS OR NAUSEA 60 tablet 1  . Multiple Vitamin (MULTIVITAMIN) capsule Take 1 capsule by mouth daily.    . ondansetron (ZOFRAN ODT) 8 MG disintegrating tablet Take 1 tablet (8 mg total) by mouth every 8 (eight) hours as needed for nausea or vomiting. 20 tablet 0  . spironolactone (ALDACTONE) 25 MG tablet Take 0.5 tablets (12.5 mg total) by mouth daily. 45 tablet 3  . aspirin EC 81 MG tablet Take 1 tablet (81 mg total) by mouth daily. (Patient not taking: Reported on 11/23/2014) 90 tablet 3   No current facility-administered medications on file prior to visit.    Allergies  Allergen Reactions  . Contrast Media [Iodinated Diagnostic Agents] Other (See Comments)    N&V  . Lisinopril Other (See Comments)    REACTION: cough  . Naproxen Sodium Other (See Comments)    REACTION: Breathing difficulty  . Penicillins Other (See Comments)    orin use.REAC  . Shellfish Allergy Other (See Comments)    Cant breath   . Simvastatin Other (See Comments)    REACTION: muscle aches  Family History  Problem Relation Age of Onset  . Heart attack Mother 18  . Diabetes Sister   . Diabetes Brother   . Coronary artery disease Other     Female first degree relative <60  . Hyperlipidemia Other   . Hypertension Other   . Cancer Other     prostate, 1st degree relative <50    Social History   Social History  . Marital Status: Widowed    Spouse Name: N/A  . Number of Children: 2  . Years of Education: N/A   Occupational History  .     Social History Main Topics  . Smoking status:  Former Smoker    Quit date: 01/18/1970  . Smokeless tobacco: Never Used  . Alcohol Use: No  . Drug Use: None  . Sexual Activity: Not Asked   Other Topics Concern  . None   Social History Narrative   Widow - husband died in March 25, 2003   Occupation - retired from working in Radiation protection practitioner   2 daughter - on lives in [place], other in [place]   Remote history of tobacco but none in 40 years   Alcohol use - no    Review of Systems - See HPI.  All other ROS are negative.  BP 120/62 mmHg  Pulse 78  Temp(Src) 98.1 F (36.7 C) (Oral)  Resp 16  Ht _0  (1.676 m)  Wt 227 lb 2 oz (103.023 kg)  BMI 36.68 kg/m2  SpO2 100%  LMP   Physical Exam  Constitutional: She is oriented to person, place, and time and well-developed, well-nourished, and in no distress.  HENT:  Head: Normocephalic and atraumatic.  Eyes: Conjunctivae are normal. Pupils are equal, round, and reactive to light.  Cardiovascular: Normal rate, regular rhythm, normal heart sounds and intact distal pulses.   Pulmonary/Chest: Effort normal and breath sounds normal. No respiratory distress. She has no wheezes. She has no rales. She exhibits no tenderness.  Neurological: She is alert and oriented to person, place, and time.  Skin: Skin is warm and dry. No rash noted.  Vitals reviewed.   Recent Results (from the past 03-24-2158 hour(s))  Implantable device - remote     Status: None   Collection Time: 09/09/14  7:34 AM  Result Value Ref Range   Date Time Interrogation Session [id number]    Pulse Generator Manufacturer MERM    Pulse Gen Model [id number] Hillery Aldo XT CRT-D    Pulse Gen Serial Number [id number]    Implantable Pulse Generator Type Cardiac Resynch Therapy Defibulator    Implantable Pulse Generator Implant Date [id number]    Lead Channel Setting Sensing Sensitivity 0.3 mV   Lead Channel Setting Pacing Amplitude 2 V   Lead Channel Setting Pacing Pulse Width 0.4 ms   Lead Channel Setting Pacing Amplitude 2.5 V     Lead Channel Setting Pacing Pulse Width 0.8 ms   Lead Channel Setting Pacing Amplitude 3.25 V   Lead Channel Setting Pacing Capture Mode Adaptive Capture    Zone Setting Type Category VF    Zone Setting Detection Interval 300 ms   Zone Setting Type Category VT    Zone Setting Detection Interval Blank    Zone Setting Type Category VENTRICULAR_TACHYCARDIA_1    Zone Setting Detection Interval 360 ms   Zone Setting Type Category VENTRICULAR_TACHYCARDIA_2    Zone Setting Detection Interval 340 ms   Zone Setting Type Category ATRIAL_FIBRILLATION    Zone Setting Type Category ATAF    Zone  Setting Detection Interval 400 ms   Lead Channel Impedance Value 456 ohm   Lead Channel Sensing Intrinsic Amplitude 3.625 mV   Lead Channel Sensing Intrinsic Amplitude 3.625 mV   Lead Channel Pacing Threshold Amplitude 0.75 V   Lead Channel Pacing Threshold Pulse Width 0.4 ms   Lead Channel Impedance Value 456 ohm   Lead Channel Impedance Value 380 ohm   Lead Channel Sensing Intrinsic Amplitude 16 mV   Lead Channel Sensing Intrinsic Amplitude 16 mV   Lead Channel Pacing Threshold Amplitude 0.625 V   Lead Channel Pacing Threshold Pulse Width 0.4 ms   HighPow Impedance 73 ohm   Lead Channel Impedance Value 988 ohm   Lead Channel Impedance Value 1045 ohm   Lead Channel Impedance Value 893 ohm   Lead Channel Impedance Value 665 ohm   Lead Channel Impedance Value 779 ohm   Lead Channel Impedance Value 817 ohm   Lead Channel Impedance Value 627 ohm   Lead Channel Impedance Value 532 ohm   Lead Channel Impedance Value 570 ohm   Lead Channel Impedance Value 380 ohm   Lead Channel Pacing Threshold Amplitude 2.125 V   Lead Channel Pacing Threshold Pulse Width 0.8 ms   Battery Status OK    Battery Remaining Longevity 65 mo   Battery Voltage 2.98 V   Brady Statistic RA Percent Paced 0.2 %   Brady Statistic RV Percent Paced 66.74 %   Brady Statistic AP VP Percent 0.19 %   Brady Statistic AS VP Percent  98.11 %   Brady Statistic AP VS Percent 0.02 %   Brady Statistic AS VS Percent 1.69 %   Eval Rhythm AsVp   Basic Metabolic Panel (BMET)     Status: None   Collection Time: 11/19/14  1:41 PM  Result Value Ref Range   Sodium 138 135 - 146 mmol/L   Potassium 4.2 3.5 - 5.3 mmol/L   Chloride 105 98 - 110 mmol/L   CO2 20 20 - 31 mmol/L   Glucose, Bld 75 65 - 99 mg/dL   BUN 9 7 - 25 mg/dL   Creat 0.71 0.50 - 0.99 mg/dL   Calcium 9.2 8.6 - 10.4 mg/dL  CBC with Differential/Platelet     Status: Abnormal   Collection Time: 11/23/14  4:42 AM  Result Value Ref Range   WBC 6.5 4.0 - 10.5 K/uL   RBC 3.80 (L) 3.87 - 5.11 MIL/uL   Hemoglobin 11.3 (L) 12.0 - 15.0 g/dL   HCT 33.8 (L) 36.0 - 46.0 %   MCV 88.9 78.0 - 100.0 fL   MCH 29.7 26.0 - 34.0 pg   MCHC 33.4 30.0 - 36.0 g/dL   RDW 14.5 11.5 - 15.5 %   Platelets 212 150 - 400 K/uL   Neutrophils Relative % 51 %   Neutro Abs 3.4 1.7 - 7.7 K/uL   Lymphocytes Relative 34 %   Lymphs Abs 2.2 0.7 - 4.0 K/uL   Monocytes Relative 8 %   Monocytes Absolute 0.5 0.1 - 1.0 K/uL   Eosinophils Relative 6 %   Eosinophils Absolute 0.4 0.0 - 0.7 K/uL   Basophils Relative 1 %   Basophils Absolute 0.0 0.0 - 0.1 K/uL  Basic metabolic panel     Status: Abnormal   Collection Time: 11/23/14  4:42 AM  Result Value Ref Range   Sodium 140 135 - 145 mmol/L   Potassium 3.7 3.5 - 5.1 mmol/L   Chloride 108 101 - 111 mmol/L  CO2 24 22 - 32 mmol/L   Glucose, Bld 105 (H) 65 - 99 mg/dL   BUN 12 6 - 20 mg/dL   Creatinine, Ser 0.71 0.44 - 1.00 mg/dL   Calcium 9.5 8.9 - 10.3 mg/dL   GFR calc non Af Amer >60 >60 mL/min   GFR calc Af Amer >60 >60 mL/min    Comment: (NOTE) The eGFR has been calculated using the CKD EPI equation. This calculation has not been validated in all clinical situations. eGFR's persistently <60 mL/min signify possible Chronic Kidney Disease.    Anion gap 8 5 - 15  Magnesium     Status: None   Collection Time: 11/23/14 11:00 AM  Result  Value Ref Range   Magnesium 2.0 1.7 - 2.4 mg/dL  Phosphorus     Status: None   Collection Time: 11/23/14 11:00 AM  Result Value Ref Range   Phosphorus 3.8 2.5 - 4.6 mg/dL  Troponin I     Status: None   Collection Time: 11/23/14 11:00 AM  Result Value Ref Range   Troponin I <0.03 <0.031 ng/mL    Comment:        NO INDICATION OF MYOCARDIAL INJURY.   Hemoglobin A1c     Status: Abnormal   Collection Time: 11/23/14  4:48 PM  Result Value Ref Range   Hgb A1c MFr Bld 5.9 (H) 4.8 - 5.6 %    Comment: (NOTE)         Pre-diabetes: 5.7 - 6.4         Diabetes: >6.4         Glycemic control for adults with diabetes: <7.0    Mean Plasma Glucose 123 mg/dL    Comment: (NOTE) Performed At: [facility] [place], [place] [id number] Lindon Romp MD LK:[id number]   Urine culture     Status: None   Collection Time: 11/23/14  4:59 PM  Result Value Ref Range   Specimen Description URINE, RANDOM    Special Requests NONE    Culture MULTIPLE SPECIES PRESENT, SUGGEST RECOLLECTION    Report Status 11/24/2014 FINAL   Urinalysis, Routine w reflex microscopic (not at [facility])     Status: Abnormal   Collection Time: 11/23/14  4:59 PM  Result Value Ref Range   Color, Urine YELLOW YELLOW   APPearance CLOUDY (A) CLEAR   Specific Gravity, Urine 1.016 1.005 - 1.030   pH 6.0 5.0 - 8.0   Glucose, UA NEGATIVE NEGATIVE mg/dL   Hgb urine dipstick NEGATIVE NEGATIVE   Bilirubin Urine NEGATIVE NEGATIVE   Ketones, ur NEGATIVE NEGATIVE mg/dL   Protein, ur NEGATIVE NEGATIVE mg/dL   Urobilinogen, UA 0.2 0.0 - 1.0 mg/dL   Nitrite NEGATIVE NEGATIVE   Leukocytes, UA SMALL (A) NEGATIVE  Urine microscopic-add on     Status: None   Collection Time: 11/23/14  4:59 PM  Result Value Ref Range   Squamous Epithelial / LPF RARE RARE   WBC, UA 3-6 <3 WBC/hpf   RBC / HPF 0-2 <3 RBC/hpf   Urine-Other AMORPHOUS URATES/PHOSPHATES   CBC     Status: Abnormal   Collection Time: 11/24/14  3:17 AM  Result  Value Ref Range   WBC 5.5 4.0 - 10.5 K/uL   RBC 3.75 (L) 3.87 - 5.11 MIL/uL   Hemoglobin 10.9 (L) 12.0 - 15.0 g/dL   HCT 33.5 (L) 36.0 - 46.0 %   MCV 89.3 78.0 - 100.0 fL   MCH 29.1 26.0 - 34.0 pg  MCHC 32.5 30.0 - 36.0 g/dL   RDW 14.9 11.5 - 15.5 %   Platelets 212 150 - 400 K/uL  Comprehensive metabolic panel     Status: Abnormal   Collection Time: 11/24/14  3:17 AM  Result Value Ref Range   Sodium 141 135 - 145 mmol/L   Potassium 3.8 3.5 - 5.1 mmol/L   Chloride 106 101 - 111 mmol/L   CO2 26 22 - 32 mmol/L   Glucose, Bld 96 65 - 99 mg/dL   BUN 6 6 - 20 mg/dL   Creatinine, Ser 0.66 0.44 - 1.00 mg/dL   Calcium 9.5 8.9 - 10.3 mg/dL   Total Protein 6.6 6.5 - 8.1 g/dL   Albumin 3.0 (L) 3.5 - 5.0 g/dL   AST 23 15 - 41 U/L   ALT 17 14 - 54 U/L   Alkaline Phosphatase 61 38 - 126 U/L   Total Bilirubin 0.6 0.3 - 1.2 mg/dL   GFR calc non Af Amer >60 >60 mL/min   GFR calc Af Amer >60 >60 mL/min    Comment: (NOTE) The eGFR has been calculated using the CKD EPI equation. This calculation has not been validated in all clinical situations. eGFR's persistently <60 mL/min signify possible Chronic Kidney Disease.    Anion gap 9 5 - 15  TSH     Status: None   Collection Time: 11/24/14  7:02 AM  Result Value Ref Range   TSH 1.117 0.350 - 4.500 uIU/mL  Basic metabolic panel     Status: Abnormal   Collection Time: 11/25/14  6:07 AM  Result Value Ref Range   Sodium 139 135 - 145 mmol/L   Potassium 4.0 3.5 - 5.1 mmol/L   Chloride 106 101 - 111 mmol/L   CO2 24 22 - 32 mmol/L   Glucose, Bld 105 (H) 65 - 99 mg/dL   BUN 8 6 - 20 mg/dL   Creatinine, Ser 0.75 0.44 - 1.00 mg/dL   Calcium 9.5 8.9 - 10.3 mg/dL   GFR calc non Af Amer >60 >60 mL/min   GFR calc Af Amer >60 >60 mL/min    Comment: (NOTE) The eGFR has been calculated using the CKD EPI equation. This calculation has not been validated in all clinical situations. eGFR's persistently <60 mL/min signify possible Chronic  Kidney Disease.    Anion gap 9 5 - 15  CBC     Status: Abnormal   Collection Time: 11/25/14  6:07 AM  Result Value Ref Range   WBC 5.5 4.0 - 10.5 K/uL   RBC 3.99 3.87 - 5.11 MIL/uL   Hemoglobin 11.8 (L) 12.0 - 15.0 g/dL   HCT 36.0 36.0 - 46.0 %   MCV 90.2 78.0 - 100.0 fL   MCH 29.6 26.0 - 34.0 pg   MCHC 32.8 30.0 - 36.0 g/dL   RDW 14.8 11.5 - 15.5 %   Platelets 208 150 - 400 K/uL  Lipid panel     Status: Abnormal   Collection Time: 11/25/14  6:07 AM  Result Value Ref Range   Cholesterol 236 (H) 0 - 200 mg/dL   Triglycerides 144 <150 mg/dL   HDL 35 (L) >40 mg/dL   Total CHOL/HDL Ratio 6.7 RATIO   VLDL 29 0 - 40 mg/dL   LDL Cholesterol 172 (H) 0 - 99 mg/dL    Comment:        Total Cholesterol/HDL:CHD Risk Coronary Heart Disease Risk Table  Men   Women  1/2 Average Risk   3.4   3.3  Average Risk       5.0   4.4  2 X Average Risk   9.6   7.1  3 X Average Risk  23.4   11.0        Use the calculated Patient Ratio above and the CHD Risk Table to determine the patient's CHD Risk.        ATP III CLASSIFICATION (LDL):  <100     mg/dL   Optimal  100-129  mg/dL   Near or Above                    Optimal  130-159  mg/dL   Borderline  160-189  mg/dL   High  >190     mg/dL   Very High   Basic Metabolic Panel (BMET)     Status: None   Collection Time: 11/26/14 12:21 PM  Result Value Ref Range   Sodium 140 135 - 145 mEq/L   Potassium 3.8 3.5 - 5.1 mEq/L   Chloride 104 96 - 112 mEq/L   CO2 28 19 - 32 mEq/L   Glucose, Bld 79 70 - 99 mg/dL   BUN 12 6 - 23 mg/dL   Creatinine, Ser 0.75 0.40 - 1.20 mg/dL   Calcium 10.1 8.4 - 10.5 mg/dL   GFR 98.42 >60.00 mL/min    Assessment/Plan: Benign paroxysmal positional vertigo Improving but still present. Patient given number for vestibular rehabilitation. She has been instructed to call to make an appointment. Continue Meclizine. Home EPley maneuvers reviewed and handout given. Referral to ENT placed.  Essential  hypertension BP stable. Continue current regimen. Will check BMP today.

## 2014-12-01 NOTE — Assessment & Plan Note (Signed)
Improving but still present. Patient given number for vestibular rehabilitation. She has been instructed to call to make an appointment. Continue Meclizine. Home EPley maneuvers reviewed and handout given. Referral to ENT placed.

## 2014-12-01 NOTE — Assessment & Plan Note (Signed)
BP stable. Continue current regimen. Will check BMP today.

## 2014-12-03 ENCOUNTER — Ambulatory Visit (INDEPENDENT_AMBULATORY_CARE_PROVIDER_SITE_OTHER): Payer: PPO | Admitting: Internal Medicine

## 2014-12-03 ENCOUNTER — Encounter: Payer: Self-pay | Admitting: *Deleted

## 2014-12-03 ENCOUNTER — Encounter: Payer: Self-pay | Admitting: Internal Medicine

## 2014-12-03 VITALS — BP 110/66 | HR 86 | Ht 65.5 in | Wt 228.0 lb

## 2014-12-03 DIAGNOSIS — I5022 Chronic systolic (congestive) heart failure: Secondary | ICD-10-CM | POA: Diagnosis not present

## 2014-12-03 DIAGNOSIS — I428 Other cardiomyopathies: Secondary | ICD-10-CM

## 2014-12-03 DIAGNOSIS — I429 Cardiomyopathy, unspecified: Secondary | ICD-10-CM | POA: Diagnosis not present

## 2014-12-03 DIAGNOSIS — Z95810 Presence of automatic (implantable) cardiac defibrillator: Secondary | ICD-10-CM

## 2014-12-03 DIAGNOSIS — I10 Essential (primary) hypertension: Secondary | ICD-10-CM

## 2014-12-03 LAB — CUP PACEART INCLINIC DEVICE CHECK
Battery Voltage: 2.97 V
Brady Statistic AS VS Percent: 1.39 %
Brady Statistic RA Percent Paced: 0.06 %
HIGH POWER IMPEDANCE MEASURED VALUE: 77 Ohm
Implantable Lead Implant Date: 20151030
Implantable Lead Location: 753858
Implantable Lead Location: 753859
Implantable Lead Location: 753860
Implantable Lead Model: 4598
Implantable Lead Model: 6935
Lead Channel Impedance Value: 399 Ohm
Lead Channel Impedance Value: 456 Ohm
Lead Channel Impedance Value: 627 Ohm
Lead Channel Impedance Value: 646 Ohm
Lead Channel Impedance Value: 779 Ohm
Lead Channel Impedance Value: 969 Ohm
Lead Channel Impedance Value: 988 Ohm
Lead Channel Pacing Threshold Amplitude: 0.75 V
Lead Channel Pacing Threshold Pulse Width: 0.4 ms
Lead Channel Sensing Intrinsic Amplitude: 17.875 mV
Lead Channel Setting Pacing Amplitude: 3.5 V
Lead Channel Setting Pacing Pulse Width: 0.8 ms
Lead Channel Setting Sensing Sensitivity: 0.3 mV
MDC IDC LEAD IMPLANT DT: 20151030
MDC IDC LEAD IMPLANT DT: 20151030
MDC IDC MSMT BATTERY REMAINING LONGEVITY: 64 mo
MDC IDC MSMT LEADCHNL LV IMPEDANCE VALUE: 1178 Ohm
MDC IDC MSMT LEADCHNL LV IMPEDANCE VALUE: 1235 Ohm
MDC IDC MSMT LEADCHNL LV IMPEDANCE VALUE: 1292 Ohm
MDC IDC MSMT LEADCHNL LV IMPEDANCE VALUE: 456 Ohm
MDC IDC MSMT LEADCHNL LV IMPEDANCE VALUE: 779 Ohm
MDC IDC MSMT LEADCHNL LV PACING THRESHOLD AMPLITUDE: 2 V
MDC IDC MSMT LEADCHNL LV PACING THRESHOLD PULSEWIDTH: 0.8 ms
MDC IDC MSMT LEADCHNL RA IMPEDANCE VALUE: 456 Ohm
MDC IDC MSMT LEADCHNL RA PACING THRESHOLD AMPLITUDE: 0.75 V
MDC IDC MSMT LEADCHNL RA PACING THRESHOLD PULSEWIDTH: 0.4 ms
MDC IDC MSMT LEADCHNL RA SENSING INTR AMPL: 5.5 mV
MDC IDC SESS DTM: 20161115102131
MDC IDC SET LEADCHNL RA PACING AMPLITUDE: 2 V
MDC IDC STAT BRADY AP VP PERCENT: 0.05 %
MDC IDC STAT BRADY AP VS PERCENT: 0.01 %
MDC IDC STAT BRADY AS VP PERCENT: 98.55 %
MDC IDC STAT BRADY RV PERCENT PACED: 82.51 %

## 2014-12-03 NOTE — Progress Notes (Signed)
HPI Brenda Marsh returns today ICD followup. She is a pleasant 69 yo woman with a h/o chemotherapy for breast CA, who was initially discovered to have LV dysfunction by echo in 2010. She had a cath at that time demonstrating no obstructive coronary artery disease. On medical therapy, her ejection fraction initially improved. She saw Dr. Stanford Breed over a year ago, and at that time a repeat echo demonstrated an ejection fraction of 35%. Her medications were titrated. Repeat echo done several months ago demonstrated an ejection fraction of 25%. She has class II heart failure symptoms. She has left bundle branch block with a QRS duration of 180 ms. She underwent BIV ICD implantation. She has not had syncope. Minimal peripheral edema. She denies dietary or medical noncompliance.  She has had vertigo.  Allergies  Allergen Reactions  . Contrast Media [Iodinated Diagnostic Agents] Nausea And Vomiting    N&V  . Lisinopril Other (See Comments)    REACTION: cough  . Naproxen Sodium Other (See Comments)    REACTION: Breathing difficulty  . Penicillins Other (See Comments)    Has patient had a PCN reaction causing immediate rash, facial/tongue/throat swelling, SOB or lightheadedness with hypotension: Yes Has patient had a PCN reaction causing severe rash involving mucus membranes or skin necrosis: No Has patient had a PCN reaction that required hospitalization No Has patient had a PCN reaction occurring within the last 10 years: No If all of the above answers are "NO", then may proceed with Cephalosporin use.REAC  . Shellfish Allergy Other (See Comments)    Cant breath   . Simvastatin Other (See Comments)    REACTION: muscle aches     Current Outpatient Prescriptions  Medication Sig Dispense Refill  . aspirin EC 81 MG tablet Take 1 tablet (81 mg total) by mouth daily. 90 tablet 3  . carvedilol (COREG) 12.5 MG tablet Take 1 tablet (12.5 mg total) by mouth 2 (two) times daily with a meal. 180  tablet 3  . Cholecalciferol (VITAMIN D) 400 UNITS capsule Take 400 Units by mouth daily.    . furosemide (LASIX) 20 MG tablet Take 1 tablet (20 mg total) by mouth daily. 30 tablet 5  . losartan (COZAAR) 50 MG tablet Take 1 tablet (50 mg total) by mouth daily. 90 tablet 3  . meclizine (ANTIVERT) 12.5 MG tablet Take 1 tablet (12.5 mg total) by mouth 2 (two) times daily. TAKE 1 TABLET BY MOUTH THREE TIMES DAILY AS NEEDED FOR DIZZINESS OR NAUSEA 60 tablet 1  . Multiple Vitamin (MULTIVITAMIN) capsule Take 1 capsule by mouth daily.    . ondansetron (ZOFRAN ODT) 8 MG disintegrating tablet Take 1 tablet (8 mg total) by mouth every 8 (eight) hours as needed for nausea or vomiting. 20 tablet 0  . spironolactone (ALDACTONE) 25 MG tablet Take 0.5 tablets (12.5 mg total) by mouth daily. 45 tablet 3   No current facility-administered medications for this visit.     Past Medical History  Diagnosis Date  . Nonischemic cardiomyopathy ([place])   . LBBB (left bundle branch block)   . History of breast cancer in female 1999    Breast; lumpectomy, radiation and chemotherapy  . Colon polyp     unclear pathology  . Hyperlipidemia   . Hypertension   . Arthritis     left knee pain, MRI in 2008-tricompartmental  degenerate changes, mucoid degeneration of ACL, post horn meniscal tear and ant horn meniscal tear  . Low back pain  left L3-4 injected  . Unspecified constipation 04/22/2012  . Anemia 07/16/2012  . Presence of permanent cardiac pacemaker   . AICD (automatic cardioverter/defibrillator) present   . Diabetes mellitus type 2, diet-controlled ([place]) 03/11/2009    Qualifier: Diagnosis of  By: Wynona Luna  (boderline )    ROS:   All systems reviewed and negative except as noted in the HPI.   Past Surgical History  Procedure Laterality Date  . Appendectomy  29-Apr-2007    Dr. Lucia Gaskins  . Carpal tunnel release  Apr 28, 2005    Dr. Daylene Katayama  . Breast surgery      lumpectomy  . Tonsillectomy    . Knee arthroscopy       Right  . Bi-ventricular implantable cardioverter defibrillator  (crt-d)  11/16/13    MDT CRTD implanted by Dr Lovena Le for NICM, CHF, and LBBB  . Bi-ventricular implantable cardioverter defibrillator N/A 11/16/2013    Procedure: BI-VENTRICULAR IMPLANTABLE CARDIOVERTER DEFIBRILLATOR  (CRT-D);  Surgeon: Evans Lance, MD;  Location: [facility] CATH LAB;  Service: Cardiovascular;  Laterality: N/A;  . Abdominal hysterectomy      Paritial     Family History  Problem Relation Age of Onset  . Heart attack Mother 20  . Diabetes Sister   . Diabetes Brother   . Coronary artery disease Other     Female first degree relative <60  . Hyperlipidemia Other   . Hypertension Other   . Cancer Other     prostate, 1st degree relative <50     Social History   Social History  . Marital Status: Widowed    Spouse Name: N/A  . Number of Children: 2  . Years of Education: N/A   Occupational History  .     Social History Main Topics  . Smoking status: Former Smoker    Quit date: 01/18/1970  . Smokeless tobacco: Never Used  . Alcohol Use: No  . Drug Use: Not on file  . Sexual Activity: Not on file   Other Topics Concern  . Not on file   Social History Narrative   Widow - husband died in 04/29/03   Occupation - retired from working in Radiation protection practitioner   2 daughter - on lives in [place], other in [place]   Remote history of tobacco but none in 40 years   Alcohol use - no     Ht 5' 5.5" (1.664 m)  Wt 228 lb (103.42 kg)  BMI 37.35 kg/m2  Physical Exam:  Well appearing but obese 69 yo woman, NAD HEENT: Unremarkable Neck:  7 cm JVD, no thyromegally Back:  No CVA tenderness Lungs:  Clear with no wheezes, well healed ICD incision. HEART:  Regular rate rhythm, no murmurs, no rubs, no clicks, split S2. Abd:  soft, positive bowel sounds, no organomegally, no rebound, no guarding Ext:  2 plus pulses, no edema, no cyanosis, no clubbing Skin:  No rashes no nodules Neuro:  CN II through XII intact,  motor grossly intact  ICD interogation - normal Medtronic BiV ICD function   Assess/Plan:

## 2014-12-03 NOTE — Patient Instructions (Signed)
Medication Instructions:  Your physician recommends that you continue on your current medications as directed. Please refer to the Current Medication list given to you today.  Labwork: None ordered  Testing/Procedures: None ordered  Follow-Up: Remote monitoring is used to monitor your Pacemaker of ICD from home. This monitoring reduces the number of office visits required to check your device to one time per year. It allows [place] to keep an eye on the functioning of your device to ensure it is working properly. You are scheduled for a device check from home on 03/04/2015. You may send your transmission at any time that day. If you have a wireless device, the transmission will be sent automatically. After your physician reviews your transmission, you will receive a postcard with your next transmission date.  Your physician wants you to follow-up in: 1 year with Dr. Lovena Le. You will receive a reminder letter in the mail two months in advance. If you don't receive a letter, please call our office to schedule the follow-up appointment.  If you need a refill on your cardiac medications before your next appointment, please call your pharmacy.  Thank you for choosing [facility]!!

## 2014-12-03 NOTE — Assessment & Plan Note (Signed)
Her blood pressure is well controlled. She will continue her current meds.  

## 2014-12-03 NOTE — Assessment & Plan Note (Signed)
Her Medtronic BiV ICD is working normally. Will recheck in several months.

## 2014-12-03 NOTE — Assessment & Plan Note (Signed)
Her symptoms are class 2. We will continue her current meds.

## 2015-01-21 MED FILL — FUROSEMIDE 20 MG TABLET: 20 | 30 days supply | Qty: 30 | Fill #3

## 2015-02-04 ENCOUNTER — Other Ambulatory Visit: Payer: Self-pay | Admitting: *Deleted

## 2015-02-04 MED ORDER — MECLIZINE HCL 12.5 MG PO TABS: 12.5000 mg | ORAL_TABLET | Freq: Two times a day (BID) | ORAL | Status: DC

## 2015-02-04 MED FILL — MECLIZINE 12.5 MG CAPLET: 12.5 | 33 days supply | Qty: 100 | Fill #0

## 2015-02-07 MED FILL — LOSARTAN POTASSIUM 50 MG TA: 50 | 90 days supply | Qty: 90 | Fill #1

## 2015-02-25 MED FILL — FUROSEMIDE 20 MG TABLET: 20 | 30 days supply | Qty: 30 | Fill #4

## 2015-03-04 ENCOUNTER — Ambulatory Visit (INDEPENDENT_AMBULATORY_CARE_PROVIDER_SITE_OTHER): Payer: PPO | Admitting: *Deleted

## 2015-03-04 DIAGNOSIS — Z95810 Presence of automatic (implantable) cardiac defibrillator: Secondary | ICD-10-CM | POA: Diagnosis not present

## 2015-03-04 DIAGNOSIS — I5022 Chronic systolic (congestive) heart failure: Secondary | ICD-10-CM | POA: Diagnosis not present

## 2015-03-04 DIAGNOSIS — I428 Other cardiomyopathies: Secondary | ICD-10-CM

## 2015-03-04 DIAGNOSIS — I429 Cardiomyopathy, unspecified: Secondary | ICD-10-CM

## 2015-03-04 NOTE — Progress Notes (Signed)
Remote ICD transmission.   

## 2015-03-05 ENCOUNTER — Encounter: Payer: Self-pay | Admitting: Cardiology

## 2015-03-05 LAB — CUP PACEART REMOTE DEVICE CHECK
Battery Voltage: 2.96 V
Brady Statistic AP VP Percent: 0.07 %
Brady Statistic AP VS Percent: 0.01 %
Brady Statistic AS VS Percent: 1.26 %
Date Time Interrogation Session: 20170214113326
HighPow Impedance: 73 Ohm
Implantable Lead Implant Date: 20151030
Implantable Lead Implant Date: 20151030
Implantable Lead Location: 753858
Implantable Lead Location: 753859
Implantable Lead Model: 4598
Lead Channel Impedance Value: 456 Ohm
Lead Channel Impedance Value: 570 Ohm
Lead Channel Impedance Value: 703 Ohm
Lead Channel Impedance Value: 855 Ohm
Lead Channel Pacing Threshold Amplitude: 0.875 V
Lead Channel Pacing Threshold Pulse Width: 0.4 ms
Lead Channel Sensing Intrinsic Amplitude: 23.875 mV
Lead Channel Sensing Intrinsic Amplitude: 23.875 mV
Lead Channel Setting Pacing Amplitude: 3.5 V
Lead Channel Setting Pacing Pulse Width: 0.8 ms
Lead Channel Setting Sensing Sensitivity: 0.3 mV
MDC IDC LEAD IMPLANT DT: 20151030
MDC IDC LEAD LOCATION: 753860
MDC IDC LEAD MODEL: 6935
MDC IDC MSMT BATTERY REMAINING LONGEVITY: 56 mo
MDC IDC MSMT LEADCHNL LV IMPEDANCE VALUE: 1026 Ohm
MDC IDC MSMT LEADCHNL LV IMPEDANCE VALUE: 1102 Ohm
MDC IDC MSMT LEADCHNL LV IMPEDANCE VALUE: 1140 Ohm
MDC IDC MSMT LEADCHNL LV IMPEDANCE VALUE: 399 Ohm
MDC IDC MSMT LEADCHNL LV IMPEDANCE VALUE: 532 Ohm
MDC IDC MSMT LEADCHNL LV IMPEDANCE VALUE: 665 Ohm
MDC IDC MSMT LEADCHNL LV IMPEDANCE VALUE: 836 Ohm
MDC IDC MSMT LEADCHNL LV PACING THRESHOLD AMPLITUDE: 2.375 V
MDC IDC MSMT LEADCHNL LV PACING THRESHOLD PULSEWIDTH: 0.8 ms
MDC IDC MSMT LEADCHNL RA PACING THRESHOLD PULSEWIDTH: 0.4 ms
MDC IDC MSMT LEADCHNL RA SENSING INTR AMPL: 3.25 mV
MDC IDC MSMT LEADCHNL RA SENSING INTR AMPL: 3.25 mV
MDC IDC MSMT LEADCHNL RV IMPEDANCE VALUE: 380 Ohm
MDC IDC MSMT LEADCHNL RV IMPEDANCE VALUE: 456 Ohm
MDC IDC MSMT LEADCHNL RV PACING THRESHOLD AMPLITUDE: 0.625 V
MDC IDC SET LEADCHNL RA PACING AMPLITUDE: 2 V
MDC IDC SET LEADCHNL RV PACING AMPLITUDE: 2.5 V
MDC IDC SET LEADCHNL RV PACING PULSEWIDTH: 0.4 ms
MDC IDC STAT BRADY AS VP PERCENT: 98.65 %
MDC IDC STAT BRADY RA PERCENT PACED: 0.09 %
MDC IDC STAT BRADY RV PERCENT PACED: 75.62 %

## 2015-03-27 MED FILL — FUROSEMIDE 20 MG TABLET: 20 | 30 days supply | Qty: 30 | Fill #5

## 2015-04-01 ENCOUNTER — Other Ambulatory Visit: Payer: Self-pay | Admitting: Cardiology

## 2015-04-01 MED FILL — CARVEDILOL 12.5 MG TABLET: 12.5 | 90 days supply | Qty: 180 | Fill #0

## 2015-04-01 NOTE — Telephone Encounter (Signed)
REFILL 

## 2015-04-18 MED FILL — SPIRONOLACTONE 25 MG TABLET: 25 | 90 days supply | Qty: 45 | Fill #2

## 2015-04-29 ENCOUNTER — Other Ambulatory Visit: Payer: Self-pay | Admitting: Cardiology

## 2015-04-30 MED FILL — FUROSEMIDE 20 MG TABLET: 20 | 30 days supply | Qty: 30 | Fill #0

## 2015-04-30 NOTE — Telephone Encounter (Signed)
Rx(s) sent to pharmacy electronically.  

## 2015-05-05 ENCOUNTER — Other Ambulatory Visit: Payer: Self-pay | Admitting: *Deleted

## 2015-05-05 MED ORDER — MECLIZINE HCL 12.5 MG PO TABS: 12.5000 mg | ORAL_TABLET | Freq: Two times a day (BID) | ORAL | Status: DC

## 2015-05-05 MED FILL — MECLIZINE 25 MG TABLET: 25 | 30 days supply | Qty: 30 | Fill #0

## 2015-05-06 MED FILL — LOSARTAN POTASSIUM 50 MG TA: 50 | 90 days supply | Qty: 90 | Fill #2

## 2015-05-12 ENCOUNTER — Ambulatory Visit (INDEPENDENT_AMBULATORY_CARE_PROVIDER_SITE_OTHER): Payer: PPO | Admitting: Family Medicine

## 2015-05-12 ENCOUNTER — Encounter: Payer: Self-pay | Admitting: Family Medicine

## 2015-05-12 VITALS — BP 122/72 | HR 72 | Temp 98.0°F | Ht 66.0 in | Wt 218.0 lb

## 2015-05-12 DIAGNOSIS — K219 Gastro-esophageal reflux disease without esophagitis: Secondary | ICD-10-CM

## 2015-05-12 DIAGNOSIS — E782 Mixed hyperlipidemia: Secondary | ICD-10-CM

## 2015-05-12 DIAGNOSIS — Z95810 Presence of automatic (implantable) cardiac defibrillator: Secondary | ICD-10-CM

## 2015-05-12 DIAGNOSIS — R110 Nausea: Secondary | ICD-10-CM | POA: Insufficient documentation

## 2015-05-12 DIAGNOSIS — Z86010 Personal history of colonic polyps: Secondary | ICD-10-CM

## 2015-05-12 DIAGNOSIS — R42 Dizziness and giddiness: Secondary | ICD-10-CM

## 2015-05-12 DIAGNOSIS — E119 Type 2 diabetes mellitus without complications: Secondary | ICD-10-CM | POA: Diagnosis not present

## 2015-05-12 DIAGNOSIS — H8110 Benign paroxysmal vertigo, unspecified ear: Secondary | ICD-10-CM

## 2015-05-12 DIAGNOSIS — I10 Essential (primary) hypertension: Secondary | ICD-10-CM | POA: Diagnosis not present

## 2015-05-12 DIAGNOSIS — Z0000 Encounter for general adult medical examination without abnormal findings: Secondary | ICD-10-CM

## 2015-05-12 DIAGNOSIS — I5022 Chronic systolic (congestive) heart failure: Secondary | ICD-10-CM

## 2015-05-12 DIAGNOSIS — T753XXA Motion sickness, initial encounter: Secondary | ICD-10-CM

## 2015-05-12 DIAGNOSIS — D649 Anemia, unspecified: Secondary | ICD-10-CM

## 2015-05-12 HISTORY — DX: Motion sickness, initial encounter: T75.3XXA

## 2015-05-12 LAB — COMPREHENSIVE METABOLIC PANEL
ALBUMIN: 4.1 g/dL (ref 3.5–5.2)
ALK PHOS: 71 U/L (ref 39–117)
ALT: 14 U/L (ref 0–35)
AST: 21 U/L (ref 0–37)
BUN: 10 mg/dL (ref 6–23)
CALCIUM: 10.1 mg/dL (ref 8.4–10.5)
CHLORIDE: 104 meq/L (ref 96–112)
CO2: 28 mEq/L (ref 19–32)
Creatinine, Ser: 0.75 mg/dL (ref 0.40–1.20)
GFR: 98.29 mL/min (ref >60.00)
Glucose, Bld: 92 mg/dL (ref 70–99)
POTASSIUM: 4.1 meq/L (ref 3.5–5.1)
SODIUM: 138 meq/L (ref 135–145)
Total Bilirubin: 0.4 mg/dL (ref 0.2–1.2)
Total Protein: 8 g/dL (ref 6.0–8.3)

## 2015-05-12 LAB — TSH: TSH: 0.67 u[IU]/mL (ref 0.35–4.50)

## 2015-05-12 LAB — HEMOGLOBIN A1C: Hgb A1c MFr Bld: 6.2 % (ref 4.6–6.5)

## 2015-05-12 LAB — CBC
HCT: 37.2 % (ref 36.0–46.0)
HEMOGLOBIN: 12.2 g/dL (ref 12.0–15.0)
MCHC: 32.9 g/dL (ref 30.0–36.0)
MCV: 90.5 fl (ref 78.0–100.0)
PLATELETS: 264 10*3/uL (ref 150.0–400.0)
RBC: 4.11 Mil/uL (ref 3.87–5.11)
RDW: 15.5 % (ref 11.5–15.5)
WBC: 6.4 10*3/uL (ref 4.0–10.5)

## 2015-05-12 LAB — LIPID PANEL
CHOL/HDL RATIO: 6
CHOLESTEROL: 260 mg/dL — AB (ref 0–200)
HDL: 41.8 mg/dL (ref >39.00)
LDL CALC: 198 mg/dL — AB (ref 0–99)
NONHDL: 218.31
Triglycerides: 101 mg/dL (ref 0.0–149.0)
VLDL: 20.2 mg/dL (ref 0.0–40.0)

## 2015-05-12 LAB — HEPATITIS C ANTIBODY: HCV AB: NEGATIVE

## 2015-05-12 MED ORDER — SCOPOLAMINE 1 MG/3DAYS TD PT72: 1.0000 | MEDICATED_PATCH | TRANSDERMAL | Status: DC

## 2015-05-12 NOTE — Progress Notes (Signed)
Pre visit review using our clinic review tool, if applicable. No additional management support is needed unless otherwise documented below in the visit note. 

## 2015-05-12 NOTE — Progress Notes (Signed)
Patient ID: Brenda Marsh, female   DOB: January 28, 1945, 70 y.o.   MRN: [id number]   Subjective:    Patient ID: Brenda Marsh, female    DOB: 1945/08/03, 70 y.o.   MRN: [id number]  Chief Complaint  Patient presents with  . Follow-up    HPI Patient is in today for follow up. No recent illness. No acute complaints. She is going on a cruise soon and she is requesting a prescription for Scopolamine patches for motion sickness. Denies CP/palp/SOB/HA/congestion/fevers/GI or GU c/o. Taking meds as prescribed  Past Medical History  Diagnosis Date  . Nonischemic cardiomyopathy ([place])   . LBBB (left bundle branch block)   . History of breast cancer in female 1997-05-07    Breast; lumpectomy, radiation and chemotherapy  . Colon polyp     unclear pathology  . Hyperlipidemia   . Hypertension   . Arthritis     left knee pain, MRI in 2008-tricompartmental  degenerate changes, mucoid degeneration of ACL, post horn meniscal tear and ant horn meniscal tear  . Low back pain     left L3-4 injected  . Unspecified constipation 04/22/2012  . Anemia 07/16/2012  . Presence of permanent cardiac pacemaker   . AICD (automatic cardioverter/defibrillator) present   . Diabetes mellitus type 2, diet-controlled () 03/11/2009    Qualifier: Diagnosis of  By: Wynona Luna  (boderline )  . Travel sickness 05/12/2015    Past Surgical History  Procedure Laterality Date  . Appendectomy  05/08/2007    Dr. Lucia Gaskins  . Carpal tunnel release  May 07, 2005    Dr. Daylene Katayama  . Breast surgery      lumpectomy  . Tonsillectomy    . Knee arthroscopy      Right  . Bi-ventricular implantable cardioverter defibrillator  (crt-d)  11/16/13    MDT CRTD implanted by Dr Lovena Le for NICM, CHF, and LBBB  . Bi-ventricular implantable cardioverter defibrillator N/A 11/16/2013    Procedure: BI-VENTRICULAR IMPLANTABLE CARDIOVERTER DEFIBRILLATOR  (CRT-D);  Surgeon: Evans Lance, MD;  Location: [facility] CATH LAB;  Service: Cardiovascular;  Laterality:  N/A;  . Abdominal hysterectomy      Paritial    Family History  Problem Relation Age of Onset  . Heart attack Mother 70  . Diabetes Sister   . Diabetes Brother   . Coronary artery disease Other     Female first degree relative <60  . Hyperlipidemia Other   . Hypertension Other   . Cancer Other     prostate, 1st degree relative <50    Social History   Social History  . Marital Status: Widowed    Spouse Name: N/A  . Number of Children: 2  . Years of Education: N/A   Occupational History  .     Social History Main Topics  . Smoking status: Former Smoker    Quit date: 01/18/1970  . Smokeless tobacco: Never Used  . Alcohol Use: No  . Drug Use: Not on file  . Sexual Activity: Not on file   Other Topics Concern  . Not on file   Social History Narrative   Widow - husband died in 2003-05-08   Occupation - retired from working in Radiation protection practitioner   2 daughter - on lives in [place], other in [place]   Remote history of tobacco but none in 40 years   Alcohol use - no    Outpatient Prescriptions Prior to Visit  Medication Sig Dispense Refill  . carvedilol (COREG) 12.5 MG  tablet TAKE 1 TABLET BY MOUTH TWICE DAILY WITH A MEAL 180 tablet 0  . Cholecalciferol (VITAMIN D) 400 UNITS capsule Take 400 Units by mouth daily.    . furosemide (LASIX) 20 MG tablet TAKE 1 TABLET BY MOUTH DAILY 30 tablet 5  . losartan (COZAAR) 50 MG tablet Take 1 tablet (50 mg total) by mouth daily. 90 tablet 3  . meclizine (ANTIVERT) 12.5 MG tablet Take 1 tablet (12.5 mg total) by mouth 2 (two) times daily. TAKE 1 TABLET BY MOUTH THREE TIMES DAILY AS NEEDED FOR DIZZINESS OR NAUSEA 60 tablet 1  . Multiple Vitamin (MULTIVITAMIN) capsule Take 1 capsule by mouth daily.    Marland Kitchen spironolactone (ALDACTONE) 25 MG tablet Take 0.5 tablets (12.5 mg total) by mouth daily. 45 tablet 3  . ondansetron (ZOFRAN ODT) 8 MG disintegrating tablet Take 1 tablet (8 mg total) by mouth every 8 (eight) hours as needed for nausea or vomiting.  (Patient not taking: Reported on 05/12/2015) 20 tablet 0  . aspirin EC 81 MG tablet Take 1 tablet (81 mg total) by mouth daily. (Patient not taking: Reported on 05/12/2015) 90 tablet 3   No facility-administered medications prior to visit.    Allergies  Allergen Reactions  . Contrast Media [Iodinated Diagnostic Agents] Nausea And Vomiting    N&V  . Crestor [Rosuvastatin] Other (See Comments)    Myalgia, weakness  . Atorvastatin Other (See Comments)    Myalgia, weakness  . Lisinopril Other (See Comments)    REACTION: cough  . Naproxen Sodium Other (See Comments)    REACTION: Breathing difficulty  . Penicillins Other (See Comments)    Has patient had a PCN reaction causing immediate rash, facial/tongue/throat swelling, SOB or lightheadedness with hypotension: No Has patient had a PCN reaction causing severe rash involving mucus membranes or skin necrosis: No Has patient had a PCN reaction that required hospitalization No Has patient had a PCN reaction occurring within the last 10 years: No If all of the above answers are "NO", then may proceed with Cephalosporin use.REAC  . Shellfish Allergy Other (See Comments)    Cant breath   . Simvastatin Other (See Comments)    REACTION: muscle aches    ROS     Objective:    Physical Exam  BP 122/72 mmHg  Pulse 72  Temp(Src) 98 F (36.7 C) (Oral)  Ht 5\' 6"  (1.676 m)  Wt 218 lb (98.884 kg)  BMI 35.20 kg/m2  SpO2 95% Wt Readings from Last 3 Encounters:  05/12/15 218 lb (98.884 kg)  12/03/14 228 lb (103.42 kg)  11/26/14 227 lb 2 oz (103.023 kg)     Lab Results  Component Value Date   WBC 6.4 05/12/2015   HGB 12.2 05/12/2015   HCT 37.2 05/12/2015   PLT 264.0 05/12/2015   GLUCOSE 92 05/12/2015   CHOL 260* 05/12/2015   TRIG 101.0 05/12/2015   HDL 41.80 05/12/2015   LDLCALC 198* 05/12/2015   ALT 14 05/12/2015   AST 21 05/12/2015   NA 138 05/12/2015   K 4.1 05/12/2015   CL 104 05/12/2015   CREATININE 0.75 05/12/2015    BUN 10 05/12/2015   CO2 28 05/12/2015   TSH 0.67 05/12/2015   HGBA1C 6.2 05/12/2015   MICROALBUR 1.22 01/15/2011    Lab Results  Component Value Date   TSH 0.67 05/12/2015   Lab Results  Component Value Date   WBC 6.4 05/12/2015   HGB 12.2 05/12/2015   HCT 37.2 05/12/2015   MCV  90.5 05/12/2015   PLT 264.0 05/12/2015   Lab Results  Component Value Date   NA 138 05/12/2015   K 4.1 05/12/2015   CO2 28 05/12/2015   GLUCOSE 92 05/12/2015   BUN 10 05/12/2015   CREATININE 0.75 05/12/2015   BILITOT 0.4 05/12/2015   ALKPHOS 71 05/12/2015   AST 21 05/12/2015   ALT 14 05/12/2015   PROT 8.0 05/12/2015   ALBUMIN 4.1 05/12/2015   CALCIUM 10.1 05/12/2015   ANIONGAP 9 11/25/2014   GFR 98.29 05/12/2015   Lab Results  Component Value Date   CHOL 260* 05/12/2015   Lab Results  Component Value Date   HDL 41.80 05/12/2015   Lab Results  Component Value Date   LDLCALC 198* 05/12/2015   Lab Results  Component Value Date   TRIG 101.0 05/12/2015   Lab Results  Component Value Date   CHOLHDL 6 05/12/2015   Lab Results  Component Value Date   HGBA1C 6.2 05/12/2015       Assessment & Plan:   Problem List Items Addressed This Visit    Anemia   Benign paroxysmal positional vertigo    Has found her episodes are worst at night, has done well since starting Meclizine 12.5 mg qhs, is given a refill today, hydrate well and eat a protein qhs      Chronic systolic CHF (congestive heart failure) (HCC) (Chronic)   Diabetes mellitus type 2, diet-controlled ([facility])    hgba1c acceptable, minimize simple carbs. Increase exercise as tolerated. Continue current meds      Relevant Orders   Hemoglobin A1c (Completed)   Essential hypertension (Chronic)    Well controlled, no changes to meds. Encouraged heart healthy diet such as the DASH diet and exercise as tolerated.       Relevant Orders   CBC (Completed)   TSH (Completed)   Comprehensive metabolic panel (Completed)   GERD  (Chronic)   Relevant Medications   scopolamine (TRANSDERM-SCOP) 1 MG/3DAYS   Hyperlipidemia, mixed (Chronic)   Relevant Orders   Lipid panel (Completed)   ICD (implantable cardioverter-defibrillator), biventricular, in situ (Chronic)    On right due to breast cancer surgery, doing well asymptomatic. RRR today      Travel sickness    Given Scopa=olamine for cruise in June at her request      RESOLVED: Vertigo    Other Visit Diagnoses    History of colonic polyps    -  Primary    Relevant Orders    Ambulatory referral to Gastroenterology    Preventative health care        Relevant Orders    Hepatitis C Antibody (Completed)       I have discontinued Ms. Schloesser's aspirin EC. I am also having her start on scopolamine. Additionally, I am having her maintain her multivitamin, Vitamin D, spironolactone, losartan, ondansetron, carvedilol, furosemide, and meclizine.  Meds ordered this encounter  Medications  . scopolamine (TRANSDERM-SCOP) 1 MG/3DAYS    Sig: Place 1 patch (1.5 mg total) onto the skin every 3 (three) days.    Dispense:  3 patch    Refill:  0     [name], MD

## 2015-05-12 NOTE — Assessment & Plan Note (Signed)
Well controlled, no changes to meds. Encouraged heart healthy diet such as the DASH diet and exercise as tolerated.  °

## 2015-05-12 NOTE — Assessment & Plan Note (Signed)
hgba1c acceptable, minimize simple carbs. Increase exercise as tolerated. Continue current meds 

## 2015-05-12 NOTE — Assessment & Plan Note (Signed)
Given Scopa=olamine for cruise in June at her request

## 2015-05-12 NOTE — Assessment & Plan Note (Signed)
Has found her episodes are worst at night, has done well since starting Meclizine 12.5 mg qhs, is given a refill today, hydrate well and eat a protein qhs

## 2015-05-12 NOTE — Assessment & Plan Note (Signed)
On right due to breast cancer surgery, doing well asymptomatic. RRR today

## 2015-05-12 NOTE — Patient Instructions (Addendum)
Rel of [facility] last colonoscopy report, pathology  AWV with RN and CPE with MD on same day Basic Carbohydrate Counting for Diabetes Mellitus Carbohydrate counting is a method for keeping track of the amount of carbohydrates you eat. Eating carbohydrates naturally increases the level of sugar (glucose) in your blood, so it is important for you to know the amount that is okay for you to have in every meal. Carbohydrate counting helps keep the level of glucose in your blood within normal limits. The amount of carbohydrates allowed is different for every person. A dietitian can help you calculate the amount that is right for you. Once you know the amount of carbohydrates you can have, you can count the carbohydrates in the foods you want to eat. Carbohydrates are found in the following foods:  Grains, such as breads and cereals.  Dried beans and soy products.  Starchy vegetables, such as potatoes, peas, and corn.  Fruit and fruit juices.  Milk and yogurt.  Sweets and snack foods, such as cake, cookies, candy, chips, soft drinks, and fruit drinks. CARBOHYDRATE COUNTING There are two ways to count the carbohydrates in your food. You can use either of the methods or a combination of both. Reading the "Nutrition Facts" on [place] The "Nutrition Facts" is an area that is included on the labels of almost all packaged food and beverages in the [place]. It includes the serving size of that food or beverage and information about the nutrients in each serving of the food, including the grams (g) of carbohydrate per serving.  Decide the number of servings of this food or beverage that you will be able to eat or drink. Multiply that number of servings by the number of grams of carbohydrate that is listed on the label for that serving. The total will be the amount of carbohydrates you will be having when you eat or drink this food or beverage. Learning Standard Serving Sizes of  Food When you eat food that is not packaged or does not include "Nutrition Facts" on the label, you need to measure the servings in order to count the amount of carbohydrates.A serving of most carbohydrate-rich foods contains about 15 g of carbohydrates. The following list includes serving sizes of carbohydrate-rich foods that provide 15 g ofcarbohydrate per serving:   1 slice of bread (1 oz) or 1 six-inch tortilla.    of a hamburger bun or English muffin.  4-6 crackers.   cup unsweetened dry cereal.    cup hot cereal.   cup rice or pasta.    cup mashed potatoes or  of a large baked potato.  1 cup fresh fruit or one small piece of fruit.    cup canned or frozen fruit or fruit juice.  1 cup milk.   cup plain fat-free yogurt or yogurt sweetened with artificial sweeteners.   cup cooked dried beans or starchy vegetable, such as peas, corn, or potatoes.  Decide the number of standard-size servings that you will eat. Multiply that number of servings by 15 (the grams of carbohydrates in that serving). For example, if you eat 2 cups of strawberries, you will have eaten 2 servings and 30 g of carbohydrates (2 servings x 15 g = 30 g). For foods such as soups and casseroles, in which more than one food is mixed in, you will need to count the carbohydrates in each food that is included. EXAMPLE OF CARBOHYDRATE COUNTING Sample Dinner  3 oz chicken breast.  cup of brown rice.   cup of corn.  1 cup milk.   1 cup strawberries with sugar-free whipped topping.  Carbohydrate Calculation Step 1: Identify the foods that contain carbohydrates:   Rice.   Corn.   Milk.   Strawberries. Step 2:Calculate the number of servings eaten of each:   2 servings of rice.   1 serving of corn.   1 serving of milk.   1 serving of strawberries. Step 3: Multiply each of those number of servings by 15 g:   2 servings of rice x 15 g = 30 g.   1 serving of corn x 15 g  = 15 g.   1 serving of milk x 15 g = 15 g.   1 serving of strawberries x 15 g = 15 g. Step 4: Add together all of the amounts to find the total grams of carbohydrates eaten: 30 g + 15 g + 15 g + 15 g = 75 g.   This information is not intended to replace advice given to you by your health care provider. Make sure you discuss any questions you have with your health care provider.   Document Released: 01/04/2005 Document Revised: 01/25/2014 Document Reviewed: 12/01/2012 Elsevier Interactive Patient Education [employer].

## 2015-05-13 ENCOUNTER — Other Ambulatory Visit: Payer: Self-pay | Admitting: Family Medicine

## 2015-05-13 DIAGNOSIS — E782 Mixed hyperlipidemia: Secondary | ICD-10-CM

## 2015-05-13 DIAGNOSIS — Z789 Other specified health status: Secondary | ICD-10-CM

## 2015-05-19 MED FILL — TRANSDERM-SCOP 1.5 MG/3 DAY: 1 | 9 days supply | Qty: 3 | Fill #0

## 2015-05-29 MED FILL — FUROSEMIDE 20 MG TABLET: 20 | 30 days supply | Qty: 30 | Fill #1

## 2015-06-03 ENCOUNTER — Ambulatory Visit (INDEPENDENT_AMBULATORY_CARE_PROVIDER_SITE_OTHER): Payer: PPO | Admitting: *Deleted

## 2015-06-03 DIAGNOSIS — I5022 Chronic systolic (congestive) heart failure: Secondary | ICD-10-CM

## 2015-06-03 DIAGNOSIS — Z95810 Presence of automatic (implantable) cardiac defibrillator: Secondary | ICD-10-CM | POA: Diagnosis not present

## 2015-06-03 DIAGNOSIS — I429 Cardiomyopathy, unspecified: Secondary | ICD-10-CM | POA: Diagnosis not present

## 2015-06-03 DIAGNOSIS — I428 Other cardiomyopathies: Secondary | ICD-10-CM

## 2015-06-03 NOTE — Progress Notes (Signed)
Remote ICD transmission.   

## 2015-06-06 ENCOUNTER — Ambulatory Visit: Payer: PPO | Admitting: Pharmacist

## 2015-06-18 MED FILL — MECLIZINE 25 MG TABLET: 25 | 30 days supply | Qty: 30 | Fill #1

## 2015-06-20 LAB — CUP PACEART REMOTE DEVICE CHECK
Battery Remaining Longevity: 53 mo
Brady Statistic AP VS Percent: 0.02 %
Brady Statistic AS VS Percent: 1.29 %
Brady Statistic RV Percent Paced: 73.19 %
Date Time Interrogation Session: 20170516062704
HighPow Impedance: 76 Ohm
Implantable Lead Implant Date: 20151030
Implantable Lead Implant Date: 20151030
Implantable Lead Location: 753859
Implantable Lead Model: 4598
Lead Channel Impedance Value: 1045 Ohm
Lead Channel Impedance Value: 1159 Ohm
Lead Channel Impedance Value: 1178 Ohm
Lead Channel Impedance Value: 437 Ohm
Lead Channel Impedance Value: 494 Ohm
Lead Channel Impedance Value: 722 Ohm
Lead Channel Impedance Value: 760 Ohm
Lead Channel Pacing Threshold Amplitude: 0.75 V
Lead Channel Pacing Threshold Amplitude: 2.125 V
Lead Channel Pacing Threshold Pulse Width: 0.4 ms
Lead Channel Pacing Threshold Pulse Width: 0.8 ms
Lead Channel Sensing Intrinsic Amplitude: 18 mV
Lead Channel Sensing Intrinsic Amplitude: 4.5 mV
Lead Channel Sensing Intrinsic Amplitude: 4.5 mV
Lead Channel Setting Pacing Amplitude: 2 V
Lead Channel Setting Pacing Amplitude: 2.5 V
Lead Channel Setting Pacing Amplitude: 3.25 V
Lead Channel Setting Pacing Pulse Width: 0.8 ms
MDC IDC LEAD IMPLANT DT: 20151030
MDC IDC LEAD LOCATION: 753858
MDC IDC LEAD LOCATION: 753860
MDC IDC LEAD MODEL: 6935
MDC IDC MSMT BATTERY VOLTAGE: 2.96 V
MDC IDC MSMT LEADCHNL LV IMPEDANCE VALUE: 589 Ohm
MDC IDC MSMT LEADCHNL LV IMPEDANCE VALUE: 627 Ohm
MDC IDC MSMT LEADCHNL LV IMPEDANCE VALUE: 893 Ohm
MDC IDC MSMT LEADCHNL LV IMPEDANCE VALUE: 950 Ohm
MDC IDC MSMT LEADCHNL RA IMPEDANCE VALUE: 494 Ohm
MDC IDC MSMT LEADCHNL RV IMPEDANCE VALUE: 437 Ohm
MDC IDC MSMT LEADCHNL RV PACING THRESHOLD AMPLITUDE: 0.625 V
MDC IDC MSMT LEADCHNL RV PACING THRESHOLD PULSEWIDTH: 0.4 ms
MDC IDC MSMT LEADCHNL RV SENSING INTR AMPL: 18 mV
MDC IDC SET LEADCHNL RV PACING PULSEWIDTH: 0.4 ms
MDC IDC SET LEADCHNL RV SENSING SENSITIVITY: 0.3 mV
MDC IDC STAT BRADY AP VP PERCENT: 0.08 %
MDC IDC STAT BRADY AS VP PERCENT: 98.62 %
MDC IDC STAT BRADY RA PERCENT PACED: 0.09 %

## 2015-06-30 ENCOUNTER — Other Ambulatory Visit: Payer: Self-pay | Admitting: Cardiology

## 2015-06-30 ENCOUNTER — Other Ambulatory Visit: Payer: Self-pay | Admitting: *Deleted

## 2015-06-30 MED ORDER — CARVEDILOL 12.5 MG PO TABS: 12.5000 mg | ORAL_TABLET | Freq: Two times a day (BID) | ORAL | Status: DC

## 2015-06-30 MED FILL — FUROSEMIDE 20 MG TABLET: 20 | 30 days supply | Qty: 30 | Fill #2

## 2015-06-30 MED FILL — CARVEDILOL 12.5 MG TABLET: 12.5 | 90 days supply | Qty: 180 | Fill #0

## 2015-07-03 ENCOUNTER — Encounter: Payer: Self-pay | Admitting: Cardiology

## 2015-07-14 MED FILL — SPIRONOLACTONE 25 MG TABLET: 25 | 90 days supply | Qty: 45 | Fill #3

## 2015-07-21 ENCOUNTER — Other Ambulatory Visit: Payer: Self-pay | Admitting: Family Medicine

## 2015-07-21 DIAGNOSIS — Z1231 Encounter for screening mammogram for malignant neoplasm of breast: Secondary | ICD-10-CM

## 2015-07-24 ENCOUNTER — Ambulatory Visit (HOSPITAL_BASED_OUTPATIENT_CLINIC_OR_DEPARTMENT_OTHER)
Admission: RE | Admit: 2015-07-24 | Discharge: 2015-07-24 | Disposition: A | Discharge status: Home / Self Care | Payer: PPO | Source: Ambulatory Visit | Attending: Family Medicine | Admitting: Family Medicine

## 2015-07-24 DIAGNOSIS — Z1231 Encounter for screening mammogram for malignant neoplasm of breast: Secondary | ICD-10-CM | POA: Diagnosis present

## 2015-08-04 MED FILL — FUROSEMIDE 20 MG TABLET: 20 | 30 days supply | Qty: 30 | Fill #3

## 2015-08-04 MED FILL — LOSARTAN POTASSIUM 50 MG TA: 50 | 90 days supply | Qty: 90 | Fill #3

## 2015-08-08 ENCOUNTER — Other Ambulatory Visit: Payer: Self-pay

## 2015-08-08 MED ORDER — MECLIZINE HCL 12.5 MG PO TABS: 12.5000 mg | ORAL_TABLET | Freq: Three times a day (TID) | ORAL | Status: DC | PRN

## 2015-08-08 MED ORDER — MECLIZINE HCL 12.5 MG PO TABS: 12.5000 mg | ORAL_TABLET | Freq: Two times a day (BID) | ORAL | Status: DC

## 2015-08-08 MED FILL — MECLIZINE 25 MG TABLET: 25 | 30 days supply | Qty: 45 | Fill #0

## 2015-09-01 MED FILL — FUROSEMIDE 20 MG TABLET: 20 | 30 days supply | Qty: 30 | Fill #4

## 2015-09-02 ENCOUNTER — Ambulatory Visit (INDEPENDENT_AMBULATORY_CARE_PROVIDER_SITE_OTHER): Payer: PPO | Admitting: *Deleted

## 2015-09-02 DIAGNOSIS — I428 Other cardiomyopathies: Secondary | ICD-10-CM

## 2015-09-02 DIAGNOSIS — Z95810 Presence of automatic (implantable) cardiac defibrillator: Secondary | ICD-10-CM

## 2015-09-02 DIAGNOSIS — I5022 Chronic systolic (congestive) heart failure: Secondary | ICD-10-CM

## 2015-09-02 NOTE — Progress Notes (Signed)
Remote ICD transmission.   

## 2015-09-03 ENCOUNTER — Encounter: Payer: Self-pay | Admitting: Cardiology

## 2015-09-11 ENCOUNTER — Encounter: Payer: Self-pay | Admitting: Cardiology

## 2015-09-21 NOTE — Progress Notes (Signed)
HPI:  FU dilated cardiomyopathy. Patient has a history of left bundle branch block. She underwent cardiac catheterization in [facility] in March of 2010 for reduced LV function. Ejection fraction was 35%. The left main was normal. The LAD had a proximal 25-30% lesion. The circumflex was normal. The right coronary artery had a 15% lesion. Patient was treated medically. Carotid Dopplers in October 2015 showed a 50-69% right and no left stenosis. Echocardiogram in August 2015 showed an ejection fraction of 25-30% and restrictive filling. Had biventricular ICD October 2015. Echo 11/16 showed EF 40, grade 1 DD.Since she was last seen, the patient denies any dyspnea on exertion, orthopnea, PND, pedal edema, palpitations, syncope or chest pain.   Current Outpatient Prescriptions  Medication Sig Dispense Refill  . carvedilol (COREG) 12.5 MG tablet Take 1 tablet (12.5 mg total) by mouth 2 (two) times daily with a meal. 180 tablet 3  . Cholecalciferol (VITAMIN D) 400 UNITS capsule Take 400 Units by mouth daily.    . furosemide (LASIX) 20 MG tablet TAKE 1 TABLET BY MOUTH DAILY 30 tablet 5  . losartan (COZAAR) 50 MG tablet Take 1 tablet (50 mg total) by mouth daily. 90 tablet 3  . meclizine (ANTIVERT) 12.5 MG tablet Take 1 tablet (12.5 mg total) by mouth 3 (three) times daily as needed for dizziness. 90 tablet 3  . Multiple Vitamin (MULTIVITAMIN) capsule Take 1 capsule by mouth daily.    . ondansetron (ZOFRAN ODT) 8 MG disintegrating tablet Take 1 tablet (8 mg total) by mouth every 8 (eight) hours as needed for nausea or vomiting. 20 tablet 0  . spironolactone (ALDACTONE) 25 MG tablet Take 0.5 tablets (12.5 mg total) by mouth daily. 45 tablet 3   No current facility-administered medications for this visit.      Past Medical History:  Diagnosis Date  . AICD (automatic cardioverter/defibrillator) present   . Anemia 07/16/2012  . Arthritis    left knee pain, MRI in 2008-tricompartmental  degenerate  changes, mucoid degeneration of ACL, post horn meniscal tear and ant horn meniscal tear  . Colon polyp    unclear pathology  . Diabetes mellitus type 2, diet-controlled ([place]) 03/11/2009   Qualifier: Diagnosis of  By: Wynona Luna  (boderline )  . History of breast cancer in female 1999   Breast; lumpectomy, radiation and chemotherapy  . Hyperlipidemia   . Hypertension   . LBBB (left bundle branch block)   . Low back pain    left L3-4 injected  . Nonischemic cardiomyopathy ([place])   . Presence of permanent cardiac pacemaker   . Travel sickness 05/12/2015  . Unspecified constipation 04/22/2012    Past Surgical History:  Procedure Laterality Date  . ABDOMINAL HYSTERECTOMY     Paritial  . APPENDECTOMY  2009   Dr. Lucia Gaskins  . BI-VENTRICULAR IMPLANTABLE CARDIOVERTER DEFIBRILLATOR N/A 11/16/2013   Procedure: BI-VENTRICULAR IMPLANTABLE CARDIOVERTER DEFIBRILLATOR  (CRT-D);  Surgeon: Evans Lance, MD;  Location[facility] CATH LAB;  Service: Cardiovascular;  Laterality: N/A;  . BI-VENTRICULAR IMPLANTABLE CARDIOVERTER DEFIBRILLATOR  (CRT-D)  11/16/13   MDT CRTD implanted by Dr Lovena Le for NICM, CHF, and LBBB  . BREAST SURGERY     lumpectomy  . CARPAL TUNNEL RELEASE  2007   Dr. Daylene Katayama  . KNEE ARTHROSCOPY     Right  . TONSILLECTOMY      Social History   Social History  . Marital status: Widowed    Spouse name: N/A  . Number  of children: 2  . Years of education: N/A   Occupational History  .  Disabled   Social History Main Topics  . Smoking status: Former Smoker    Quit date: 01/18/1970  . Smokeless tobacco: Never Used  . Alcohol use No  . Drug use: Unknown  . Sexual activity: Not on file   Other Topics Concern  . Not on file   Social History Narrative   Widow - husband died in 05/05/03   Occupation - retired from working in Radiation protection practitioner   2 daughter - on lives in [place], other in [place]   Remote history of tobacco but none in 40 years   Alcohol use - no    Family History    Problem Relation Age of Onset  . Heart attack Mother 73  . Diabetes Sister   . Diabetes Brother   . Coronary artery disease Other     Female first degree relative <60  . Hyperlipidemia Other   . Hypertension Other   . Cancer Other     prostate, 1st degree relative <50    ROS: Knee arthralgias but no fevers or chills, productive cough, hemoptysis, dysphasia, odynophagia, melena, hematochezia, dysuria, hematuria, rash, seizure activity, orthopnea, PND, pedal edema, claudication. Remaining systems are negative.  Physical Exam: Well-developed obese in no acute distress.  Skin is warm and dry.  HEENT is normal.  Neck is supple.  Chest is clear to auscultation with normal expansion.  Cardiovascular exam is regular rate and rhythm.  Abdominal exam nontender or distended. No masses palpated. Extremities show no edema. Lymphedema left upper extremity neuro grossly intact  ECG Sinus rhythm with ventricular pacing.  A/P  1 Hyperlipidemia-patient intolerant to statins.  2 hypertension-blood pressure controlled. Continue present medications.  3 Cardiomyopathy-continue ARB and beta blocker. Continue present dose of diuretics.  4 carotid artery disease-she is overdue for carotid Dopplers. I recommended repeating study. She declined and understands the risk of stroke with undiagnosed cerebrovascular disease. She is allergic to aspirin and intolerant to statins. I recommended Plavix but she declined.  5 Chronic systolic heart failure-continue present dose of diuretics.  6 ICD-followed by electrophysiology.  Kirk Ruths, MD

## 2015-09-23 LAB — CUP PACEART REMOTE DEVICE CHECK
Battery Remaining Longevity: 45 mo
Battery Voltage: 2.96 V
Brady Statistic RA Percent Paced: 0.08 %
Brady Statistic RV Percent Paced: 71.47 %
Date Time Interrogation Session: 20170815062602
HIGH POWER IMPEDANCE MEASURED VALUE: 78 Ohm
Implantable Lead Implant Date: 20151030
Implantable Lead Location: 753859
Implantable Lead Model: 4598
Implantable Lead Model: 5076
Implantable Lead Model: 6935
Lead Channel Impedance Value: 1083 Ohm
Lead Channel Impedance Value: 456 Ohm
Lead Channel Impedance Value: 722 Ohm
Lead Channel Impedance Value: 722 Ohm
Lead Channel Impedance Value: 912 Ohm
Lead Channel Pacing Threshold Amplitude: 0.625 V
Lead Channel Pacing Threshold Pulse Width: 0.4 ms
Lead Channel Sensing Intrinsic Amplitude: 15.375 mV
Lead Channel Sensing Intrinsic Amplitude: 15.375 mV
Lead Channel Setting Pacing Amplitude: 2 V
Lead Channel Setting Pacing Amplitude: 3.25 V
Lead Channel Setting Pacing Pulse Width: 0.4 ms
Lead Channel Setting Pacing Pulse Width: 0.8 ms
MDC IDC LEAD IMPLANT DT: 20151030
MDC IDC LEAD IMPLANT DT: 20151030
MDC IDC LEAD LOCATION: 753858
MDC IDC LEAD LOCATION: 753860
MDC IDC MSMT LEADCHNL LV IMPEDANCE VALUE: 1159 Ohm
MDC IDC MSMT LEADCHNL LV IMPEDANCE VALUE: 1178 Ohm
MDC IDC MSMT LEADCHNL LV IMPEDANCE VALUE: 456 Ohm
MDC IDC MSMT LEADCHNL LV IMPEDANCE VALUE: 570 Ohm
MDC IDC MSMT LEADCHNL LV IMPEDANCE VALUE: 589 Ohm
MDC IDC MSMT LEADCHNL LV IMPEDANCE VALUE: 893 Ohm
MDC IDC MSMT LEADCHNL LV PACING THRESHOLD AMPLITUDE: 2 V
MDC IDC MSMT LEADCHNL LV PACING THRESHOLD PULSEWIDTH: 0.8 ms
MDC IDC MSMT LEADCHNL RA IMPEDANCE VALUE: 456 Ohm
MDC IDC MSMT LEADCHNL RA PACING THRESHOLD AMPLITUDE: 0.625 V
MDC IDC MSMT LEADCHNL RA SENSING INTR AMPL: 4.75 mV
MDC IDC MSMT LEADCHNL RA SENSING INTR AMPL: 4.75 mV
MDC IDC MSMT LEADCHNL RV IMPEDANCE VALUE: 399 Ohm
MDC IDC MSMT LEADCHNL RV PACING THRESHOLD PULSEWIDTH: 0.4 ms
MDC IDC SET LEADCHNL RV PACING AMPLITUDE: 2.5 V
MDC IDC SET LEADCHNL RV SENSING SENSITIVITY: 0.3 mV
MDC IDC STAT BRADY AP VP PERCENT: 0.07 %
MDC IDC STAT BRADY AP VS PERCENT: 0.01 %
MDC IDC STAT BRADY AS VP PERCENT: 98.63 %
MDC IDC STAT BRADY AS VS PERCENT: 1.29 %

## 2015-09-24 ENCOUNTER — Ambulatory Visit (INDEPENDENT_AMBULATORY_CARE_PROVIDER_SITE_OTHER): Payer: PPO | Admitting: Cardiology

## 2015-09-24 ENCOUNTER — Encounter: Payer: Self-pay | Admitting: Cardiology

## 2015-09-24 VITALS — BP 115/73 | HR 70 | Ht 66.0 in | Wt 213.8 lb

## 2015-09-24 DIAGNOSIS — I429 Cardiomyopathy, unspecified: Secondary | ICD-10-CM

## 2015-09-24 DIAGNOSIS — I5022 Chronic systolic (congestive) heart failure: Secondary | ICD-10-CM | POA: Diagnosis not present

## 2015-09-24 DIAGNOSIS — I679 Cerebrovascular disease, unspecified: Secondary | ICD-10-CM

## 2015-09-24 DIAGNOSIS — Z95810 Presence of automatic (implantable) cardiac defibrillator: Secondary | ICD-10-CM | POA: Diagnosis not present

## 2015-09-24 DIAGNOSIS — I10 Essential (primary) hypertension: Secondary | ICD-10-CM

## 2015-09-24 DIAGNOSIS — I428 Other cardiomyopathies: Secondary | ICD-10-CM

## 2015-09-24 NOTE — Patient Instructions (Signed)
Your physician recommends that you continue on your current medications as directed. Please refer to the Current Medication list given to you today.   Your physician wants you to follow-up in: YEAR WITH DR CRENSHAW  You will receive a reminder letter in the mail two months in advance. If you don't receive a letter, please call our office to schedule the follow-up appointment.  

## 2015-09-26 ENCOUNTER — Other Ambulatory Visit: Payer: Self-pay

## 2015-09-26 ENCOUNTER — Telehealth: Payer: Self-pay | Admitting: Hematology & Oncology

## 2015-09-26 DIAGNOSIS — Z853 Personal history of malignant neoplasm of breast: Secondary | ICD-10-CM

## 2015-09-26 NOTE — Telephone Encounter (Signed)
Due the weather, patient's apt time was changed from 09/29/15 @ 3:15 to 09/29/15 @ 2:15pm.  Patient was called, but there was no answer, message was left on voice mail about apt time change.  Patient was asked to call office to confirm the apt time.

## 2015-09-29 ENCOUNTER — Encounter: Payer: Self-pay | Admitting: Hematology & Oncology

## 2015-09-29 ENCOUNTER — Other Ambulatory Visit: Payer: PPO

## 2015-09-29 ENCOUNTER — Ambulatory Visit: Payer: PPO | Admitting: Hematology & Oncology

## 2015-09-29 ENCOUNTER — Ambulatory Visit (HOSPITAL_BASED_OUTPATIENT_CLINIC_OR_DEPARTMENT_OTHER): Payer: PPO | Admitting: Hematology & Oncology

## 2015-09-29 ENCOUNTER — Other Ambulatory Visit (HOSPITAL_BASED_OUTPATIENT_CLINIC_OR_DEPARTMENT_OTHER): Payer: PPO

## 2015-09-29 VITALS — BP 145/63 | HR 69 | Temp 97.7°F | Resp 18 | Ht 66.0 in | Wt 213.0 lb

## 2015-09-29 DIAGNOSIS — Z95810 Presence of automatic (implantable) cardiac defibrillator: Secondary | ICD-10-CM

## 2015-09-29 DIAGNOSIS — I890 Lymphedema, not elsewhere classified: Secondary | ICD-10-CM

## 2015-09-29 DIAGNOSIS — E782 Mixed hyperlipidemia: Secondary | ICD-10-CM

## 2015-09-29 DIAGNOSIS — Z853 Personal history of malignant neoplasm of breast: Secondary | ICD-10-CM

## 2015-09-29 LAB — CBC WITH DIFFERENTIAL (CANCER CENTER ONLY)
BASO#: 0.1 10*3/uL (ref 0.0–0.2)
BASO%: 1.4 % (ref 0.0–2.0)
EOS ABS: 0.4 10*3/uL (ref 0.0–0.5)
EOS%: 6.4 % (ref 0.0–7.0)
HEMATOCRIT: 37.4 % (ref 34.8–46.6)
HGB: 12.5 g/dL (ref 11.6–15.9)
LYMPH#: 3 10*3/uL (ref 0.9–3.3)
LYMPH%: 45 % (ref 14.0–48.0)
MCH: 31.4 pg (ref 26.0–34.0)
MCHC: 33.4 g/dL (ref 32.0–36.0)
MCV: 94 fL (ref 81–101)
MONO#: 0.5 10*3/uL (ref 0.1–0.9)
MONO%: 7.2 % (ref 0.0–13.0)
NEUT#: 2.6 10*3/uL (ref 1.5–6.5)
NEUT%: 40 % (ref 39.6–80.0)
Platelets: ADEQUATE 10*3/uL (ref 145–400)
RBC: 3.98 10*6/uL (ref 3.70–5.32)
RDW: 14.9 % (ref 11.1–15.7)
WBC: 6.6 10*3/uL (ref 3.9–10.0)

## 2015-09-29 LAB — CMP (CANCER CENTER ONLY)
ALT(SGPT): 28 U/L (ref 10–47)
AST: 24 U/L (ref 11–38)
Albumin: 3.9 g/dL (ref 3.3–5.5)
Alkaline Phosphatase: 78 U/L (ref 26–84)
BUN: 6 mg/dL — AB (ref 7–22)
CHLORIDE: 104 meq/L (ref 98–108)
CO2: 25 meq/L (ref 18–33)
CREATININE: 1 mg/dL (ref 0.6–1.2)
Calcium: 9.6 mg/dL (ref 8.0–10.3)
GLUCOSE: 73 mg/dL (ref 73–118)
Potassium: 3.8 mEq/L (ref 3.3–4.7)
SODIUM: 132 meq/L (ref 128–145)
Total Bilirubin: 0.7 mg/dl (ref 0.20–1.60)
Total Protein: 7.7 g/dL (ref 6.4–8.1)

## 2015-09-29 NOTE — Progress Notes (Signed)
Hematology and Oncology Follow Up Visit  Brenda Marsh [id number] 1945/12/30 70 y.o. 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 We see her once year. She is doing quite well. She's had no issues with her heart. 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  She does have lymphedema of the left arm. This is chronic. She's had no issues with nausea or vomiting. She's had no cough or shortness of breath. She's had no change in bowel or bladder habits.   She just got back from a funeral. Brenda Marsh good friend of hers passed away from [place].   She has had no rashes. She's had no bleeding. She's had no leg swelling.   Overall, her performance status is ECOG 0.  Medications:    Medication List       Accurate as of 09/29/15  4:05 PM. Always use your most recent med list.          carvedilol 12.5 MG tablet Commonly known as:  COREG Take 1 tablet (12.5 mg total) by mouth 2 (two) times daily with a meal.   furosemide 20 MG tablet Commonly known as:  LASIX TAKE 1 TABLET BY MOUTH DAILY   losartan 50 MG tablet Commonly known as:  COZAAR Take 1 tablet (50 mg total) by mouth daily.   meclizine 12.5 MG tablet Commonly known as:  ANTIVERT Take 1 tablet (12.5 mg total) by mouth 3 (three) times daily as needed for dizziness.   multivitamin capsule Take 1 capsule by mouth daily.   ondansetron 8 MG disintegrating tablet Commonly known as:  ZOFRAN ODT Take 1 tablet (8 mg total) by mouth every 8 (eight) hours as needed for nausea or vomiting.   spironolactone 25 MG tablet Commonly known as:  ALDACTONE Take 0.5 tablets (12.5 mg total) by mouth daily.   Vitamin D 400 units capsule Take 400 Units by mouth daily.       Allergies:   Past Medical History, Surgical history, Social history, and Family History were reviewed and updated.  Review  of Systems: All other 10 point review of systems is negative.   Physical Exam:  height is 5\' 6"  (1.676 m) and weight is 213 lb (96.6 kg). Her oral temperature is 97.7 F (36.5 C). Her blood pressure is 145/63 (abnormal) and her pulse is 69. Her respiration is 18.   Wt Readings from Last 3 Encounters:  09/29/15 213 lb (96.6 kg)  09/24/15 213 lb 12.8 oz (97 kg)  05/12/15 218 lb (98.9 kg)    Well-developed and well-nourished African American female in no obvious distress. Head and neck exam shows no ocular or oral lesions. She has no palpable cervical or supraclavicular lymph nodes. Lungs are clear bilaterally. Cardiac exam regular rate and rhythm with no murmurs, rubs or bruits. Breast exam shows right breast with no masses, edema or erythema. There is no right axillary adenopathy. Left breast is contracted from surgery and radiation. She has a lobectomy scar at about the 2:00 position. The some firmness with the lobectomy scar but no distinct masses noted. There is no left axillary adenopathy. Abdomen is soft. She has good bowel sounds. There is no fluid wave. There is no palpable liver or spleen tip. Back exam shows no tenderness over the spine, ribs or hips. 70 shows moderate lymphedema of the left arm. Neurological exam shows  no focal neurological deficits.   Lab Results  Component Value Date   WBC 6.6 09/29/2015   HGB 12.5 09/29/2015   HCT 37.4 09/29/2015   MCV 94 09/29/2015   PLT Clumped Platelets--Appears Adequate 09/29/2015   Lab Results  Component Value Date   IRON 56 12/21/2012   TIBC 429 12/21/2012   UIBC 373 12/21/2012   IRONPCTSAT 13 (L) 12/21/2012   Lab Results  Component Value Date   RBC 3.98 09/29/2015   No results found for: KPAFRELGTCHN, LAMBDASER, KAPLAMBRATIO No results found for: IGGSERUM, IGA, IGMSERUM No results found for: Odetta Pink, SPEI   Chemistry      Component Value Date/Time   NA 132 09/29/2015  1537   K 3.8 09/29/2015 1537   CL 104 09/29/2015 1537   CO2 25 09/29/2015 1537   BUN 6 (L) 09/29/2015 1537   CREATININE 1.0 09/29/2015 1537      Component Value Date/Time   CALCIUM 9.6 09/29/2015 1537   ALKPHOS 78 09/29/2015 1537   AST 24 09/29/2015 1537   ALT 28 09/29/2015 1537   BILITOT 0.70 09/29/2015 1537     Impression and Plan: [name] is a 70 yo African American female with a history of stage IIA infiltrating ductal carcinoma left breast. She completed treatment 15 years ago. So far, there has been no evidence of recurrence. She is here today with lymphedema of the left arm after letting some heavy buckets last week.    She has the lymphedema in her left arm. She is managing this pretty well.   For now, we will plan to get her back in another year. I think her ultimate prognosis and mortality is based upon her cardiac disease.    [name], MD 9/11/20174:05 PM

## 2015-09-30 LAB — VITAMIN D 25 HYDROXY (VIT D DEFICIENCY, FRACTURES): Vitamin D, 25-Hydroxy: 42.6 ng/mL (ref 30.0–100.0)

## 2015-09-30 LAB — LACTATE DEHYDROGENASE: LDH: 370 U/L — ABNORMAL HIGH (ref 125–245)

## 2015-10-01 MED FILL — CARVEDILOL 12.5 MG TABLET: 12.5 | 90 days supply | Qty: 180 | Fill #1

## 2015-10-01 MED FILL — FUROSEMIDE 20 MG TABLET: 20 | 30 days supply | Qty: 30 | Fill #5

## 2015-10-03 ENCOUNTER — Other Ambulatory Visit: Payer: Self-pay

## 2015-10-03 MED ORDER — DOXYCYCLINE HYCLATE 100 MG PO TABS: 100.0000 mg | ORAL_TABLET | Freq: Two times a day (BID) | ORAL | 0 refills | Status: DC

## 2015-10-03 MED FILL — DOXYCYCLINE 100 MG TABLET: 100 | 10 days supply | Qty: 20 | Fill #0

## 2015-10-13 MED FILL — MECLIZINE 25 MG TABLET: 25 | 30 days supply | Qty: 45 | Fill #1

## 2015-10-14 ENCOUNTER — Other Ambulatory Visit: Payer: Self-pay | Admitting: Cardiology

## 2015-10-14 DIAGNOSIS — I5022 Chronic systolic (congestive) heart failure: Secondary | ICD-10-CM

## 2015-10-14 MED FILL — SPIRONOLACTONE 25 MG TABLET: 25 | 90 days supply | Qty: 45 | Fill #0 | Status: TO

## 2015-10-29 ENCOUNTER — Other Ambulatory Visit: Payer: Self-pay | Admitting: Cardiology

## 2015-10-30 MED FILL — LOSARTAN POTASSIUM 50 MG TA: 50 | 90 days supply | Qty: 90 | Fill #0

## 2015-10-30 NOTE — Telephone Encounter (Signed)
Rx request sent to pharmacy.  

## 2015-11-05 ENCOUNTER — Other Ambulatory Visit: Payer: Self-pay | Admitting: Cardiology

## 2015-11-06 MED FILL — FUROSEMIDE 20 MG TABLET: 20 | 30 days supply | Qty: 30 | Fill #0

## 2015-11-06 NOTE — Telephone Encounter (Signed)
REFILL 

## 2015-11-07 ENCOUNTER — Other Ambulatory Visit: Payer: Self-pay | Admitting: *Deleted

## 2015-11-07 DIAGNOSIS — Z853 Personal history of malignant neoplasm of breast: Secondary | ICD-10-CM

## 2015-11-10 ENCOUNTER — Other Ambulatory Visit: Payer: Self-pay | Admitting: *Deleted

## 2015-11-10 ENCOUNTER — Other Ambulatory Visit (HOSPITAL_BASED_OUTPATIENT_CLINIC_OR_DEPARTMENT_OTHER): Payer: PPO

## 2015-11-10 DIAGNOSIS — I10 Essential (primary) hypertension: Secondary | ICD-10-CM | POA: Diagnosis not present

## 2015-11-10 DIAGNOSIS — Z853 Personal history of malignant neoplasm of breast: Secondary | ICD-10-CM

## 2015-11-10 LAB — TSH: TSH: 0.596 m(IU)/L (ref 0.308–3.960)

## 2015-11-11 ENCOUNTER — Other Ambulatory Visit (HOSPITAL_BASED_OUTPATIENT_CLINIC_OR_DEPARTMENT_OTHER): Payer: PPO

## 2015-11-11 DIAGNOSIS — I10 Essential (primary) hypertension: Secondary | ICD-10-CM | POA: Diagnosis not present

## 2015-11-11 DIAGNOSIS — Z853 Personal history of malignant neoplasm of breast: Secondary | ICD-10-CM | POA: Diagnosis not present

## 2015-11-11 LAB — LUTEINIZING HORMONE: LH: 25.3 m[IU]/mL

## 2015-11-11 LAB — FOLLICLE STIMULATING HORMONE: FSH: 36.8 m[IU]/mL

## 2015-11-18 LAB — ESTRADIOL, ULTRA SENS: Estradiol, Sensitive: 13.9 pg/mL

## 2015-12-03 ENCOUNTER — Other Ambulatory Visit: Payer: Self-pay

## 2015-12-03 ENCOUNTER — Encounter: Payer: Self-pay | Admitting: Internal Medicine

## 2015-12-03 ENCOUNTER — Ambulatory Visit (INDEPENDENT_AMBULATORY_CARE_PROVIDER_SITE_OTHER): Payer: PPO | Admitting: Internal Medicine

## 2015-12-03 VITALS — BP 124/74 | HR 76 | Ht 65.0 in | Wt 212.6 lb

## 2015-12-03 DIAGNOSIS — I5022 Chronic systolic (congestive) heart failure: Secondary | ICD-10-CM

## 2015-12-03 DIAGNOSIS — Z95810 Presence of automatic (implantable) cardiac defibrillator: Secondary | ICD-10-CM

## 2015-12-03 LAB — CUP PACEART INCLINIC DEVICE CHECK
Battery Voltage: 2.95 V
Brady Statistic AP VP Percent: 0.08 %
Brady Statistic AS VP Percent: 98.63 %
Brady Statistic AS VS Percent: 1.28 %
Brady Statistic RV Percent Paced: 73.09 %
HIGH POWER IMPEDANCE MEASURED VALUE: 77 Ohm
Implantable Lead Implant Date: 20151030
Implantable Lead Implant Date: 20151030
Implantable Lead Location: 753859
Implantable Lead Location: 753860
Implantable Lead Model: 4598
Implantable Lead Model: 5076
Implantable Lead Model: 6935
Lead Channel Impedance Value: 1159 Ohm
Lead Channel Impedance Value: 1178 Ohm
Lead Channel Impedance Value: 437 Ohm
Lead Channel Impedance Value: 456 Ohm
Lead Channel Impedance Value: 589 Ohm
Lead Channel Impedance Value: 589 Ohm
Lead Channel Impedance Value: 722 Ohm
Lead Channel Impedance Value: 912 Ohm
Lead Channel Pacing Threshold Amplitude: 0.75 V
Lead Channel Pacing Threshold Amplitude: 0.75 V
Lead Channel Pacing Threshold Amplitude: 1.5 V
Lead Channel Pacing Threshold Pulse Width: 0.4 ms
Lead Channel Pacing Threshold Pulse Width: 1 ms
Lead Channel Sensing Intrinsic Amplitude: 16.125 mV
Lead Channel Sensing Intrinsic Amplitude: 4.25 mV
Lead Channel Sensing Intrinsic Amplitude: 4.75 mV
Lead Channel Setting Pacing Amplitude: 2.5 V
Lead Channel Setting Sensing Sensitivity: 0.3 mV
MDC IDC LEAD IMPLANT DT: 20151030
MDC IDC LEAD LOCATION: 753858
MDC IDC MSMT BATTERY REMAINING LONGEVITY: 40 mo
MDC IDC MSMT LEADCHNL LV IMPEDANCE VALUE: 1083 Ohm
MDC IDC MSMT LEADCHNL LV IMPEDANCE VALUE: 437 Ohm
MDC IDC MSMT LEADCHNL LV IMPEDANCE VALUE: 703 Ohm
MDC IDC MSMT LEADCHNL LV IMPEDANCE VALUE: 912 Ohm
MDC IDC MSMT LEADCHNL RA PACING THRESHOLD PULSEWIDTH: 0.4 ms
MDC IDC MSMT LEADCHNL RV IMPEDANCE VALUE: 494 Ohm
MDC IDC MSMT LEADCHNL RV SENSING INTR AMPL: 18.875 mV
MDC IDC PG IMPLANT DT: 20151030
MDC IDC SESS DTM: 20171115165444
MDC IDC SET LEADCHNL LV PACING PULSEWIDTH: 1 ms
MDC IDC SET LEADCHNL RA PACING AMPLITUDE: 2 V
MDC IDC SET LEADCHNL RV PACING AMPLITUDE: 2.5 V
MDC IDC SET LEADCHNL RV PACING PULSEWIDTH: 0.4 ms
MDC IDC STAT BRADY AP VS PERCENT: 0.01 %
MDC IDC STAT BRADY RA PERCENT PACED: 0.09 %

## 2015-12-03 NOTE — Patient Instructions (Signed)
Medication Instructions:  Your physician recommends that you continue on your current medications as directed. Please refer to the Current Medication list given to you today.   Labwork: None Ordered   Testing/Procedures: None Ordered   Follow-Up: Your physician wants you to follow-up in: 1 year with Dr. Lovena Le.  You will receive a reminder letter in the mail two months in advance. If you don't receive a letter, please call our office to schedule the follow-up appointment.  Remote monitoring is used to monitor your Pacemaker of ICD from home. This monitoring reduces the number of office visits required to check your device to one time per year. It allows [place] to keep an eye on the functioning of your device to ensure it is working properly. You are scheduled for a device check from home on 03/03/16. You may send your transmission at any time that day. If you have a wireless device, the transmission will be sent automatically. After your physician reviews your transmission, you will receive a postcard with your next transmission date.    Any Other Special Instructions Will Be Listed Below (If Applicable).     If you need a refill on your cardiac medications before your next appointment, please call your pharmacy.

## 2015-12-03 NOTE — Progress Notes (Signed)
HPI Brenda Marsh returns today ICD followup. She is a pleasant 70 yo woman with a h/o chemotherapy for breast CA, who was initially discovered to have LV dysfunction by echo in 2010. She was placed on maximal medical therapy but ultimately had severe LV dysfunction and LBBB and underwent BiV ICD implant a couple of years ago. The patient has been stable in the interim. She denies chest pain. She has rare diaphragmatic stimulation.    Current Outpatient Prescriptions  Medication Sig Dispense Refill  . carvedilol (COREG) 12.5 MG tablet Take 1 tablet (12.5 mg total) by mouth 2 (two) times daily with a meal. 180 tablet 3  . Cholecalciferol (VITAMIN D) 400 UNITS capsule Take 400 Units by mouth daily.    . furosemide (LASIX) 20 MG tablet TAKE 1 TABLET BY MOUTH DAILY 30 tablet 11  . losartan (COZAAR) 50 MG tablet TAKE 1 TABLET (50 MG TOTAL) BY MOUTH DAILY. 90 tablet 3  . meclizine (ANTIVERT) 12.5 MG tablet Take 1 tablet (12.5 mg total) by mouth 3 (three) times daily as needed for dizziness. 90 tablet 3  . Multiple Vitamin (MULTIVITAMIN) capsule Take 1 capsule by mouth daily.    . ondansetron (ZOFRAN ODT) 8 MG disintegrating tablet Take 1 tablet (8 mg total) by mouth every 8 (eight) hours as needed for nausea or vomiting. 20 tablet 0  . spironolactone (ALDACTONE) 25 MG tablet TAKE 1/2 TABLET (12.5 MG TOTAL) BY MOUTH DAILY. 45 tablet 3   No current facility-administered medications for this visit.      Past Medical History:  Diagnosis Date  . AICD (automatic cardioverter/defibrillator) present   . Anemia 07/16/2012  . Arthritis    left knee pain, MRI in 2008-tricompartmental  degenerate changes, mucoid degeneration of ACL, post horn meniscal tear and ant horn meniscal tear  . Colon polyp    unclear pathology  . Diabetes mellitus type 2, diet-controlled ([place]) 03/11/2009   Qualifier: Diagnosis of  By: Wynona Luna  (boderline )  . History of breast cancer in female 1999   Breast;  lumpectomy, radiation and chemotherapy  . Hyperlipidemia   . Hypertension   . LBBB (left bundle branch block)   . Low back pain    left L3-4 injected  . Nonischemic cardiomyopathy ([place])   . Presence of permanent cardiac pacemaker   . Travel sickness 05/12/2015  . Unspecified constipation 04/22/2012    ROS:   All systems reviewed and negative except as noted in the HPI.   Past Surgical History:  Procedure Laterality Date  . ABDOMINAL HYSTERECTOMY     Paritial  . APPENDECTOMY  2009   Dr. Lucia Gaskins  . BI-VENTRICULAR IMPLANTABLE CARDIOVERTER DEFIBRILLATOR N/A 11/16/2013   Procedure: BI-VENTRICULAR IMPLANTABLE CARDIOVERTER DEFIBRILLATOR  (CRT-D);  Surgeon: Evans Lance, MD;  Location[facility] C[facility] LAB;  Service: Cardiovascular;  Laterality: N/A;  . BI-VENTRICULAR IMPLANTABLE CARDIOVERTER DEFIBRILLATOR  (CRT-D)  11/16/13   MDT CRTD implanted by Dr Lovena Le for NICM, CHF, and LBBB  . BREAST SURGERY     lumpectomy  . CARPAL TUNNEL RELEASE  2007   Dr. Daylene Katayama  . KNEE ARTHROSCOPY     Right  . TONSILLECTOMY       Family History  Problem Relation Age of Onset  . Heart attack Mother 36  . Diabetes Sister   . Diabetes Brother   . Coronary artery disease Other     Female first degree relative <60  . Hyperlipidemia Other   . Hypertension  Other   . Cancer Other     prostate, 1st degree relative <50     Social History   Social History  . Marital status: Widowed    Spouse name: N/A  . Number of children: 2  . Years of education: N/A   Occupational History  .  Disabled   Social History Main Topics  . Smoking status: Former Smoker    Quit date: 01/18/1970  . Smokeless tobacco: Never Used  . Alcohol use No  . Drug use: Unknown  . Sexual activity: Not on file   Other Topics Concern  . Not on file   Social History Narrative   Widow - husband died in 29-Apr-2003   Occupation - retired from working in Radiation protection practitioner   2 daughter - on lives in [place], other in [place]   Remote history of  tobacco but none in 40 years   Alcohol use - no     BP 124/74   Pulse 76   Ht 5\' 5"  (1.651 m)   Wt 212 lb 9.6 oz (96.4 kg)   SpO2 97%   BMI 35.38 kg/m   Physical Exam:  Well appearing but obese 70 yo woman, NAD HEENT: Unremarkable Neck:  7 cm JVD, no thyromegally Back:  No CVA tenderness Lungs:  Clear with no wheezes, well healed ICD incision. HEART:  Regular rate rhythm, no murmurs, no rubs, no clicks, split S2. Abd:  soft, positive bowel sounds, no organomegally, no rebound, no guarding Ext:  2 plus pulses, no edema, no cyanosis, no clubbing Skin:  No rashes no nodules Neuro:  CN II through XII intact, motor grossly intact  ICD interogation - normal Medtronic BiV ICD function   Assess/Plan: 1. Chronic systolic heart failure - her symptoms are class 2. She will continue her current meds. 2. ICD - her medtronic BiV ICD is working normally. She will recheck in several months. 3. HTN heart disease - her blood pressure is good today. No change in her meds.  Mikle Bosworth.D.

## 2015-12-09 MED FILL — FUROSEMIDE 20 MG TABLET: 20 | 30 days supply | Qty: 30 | Fill #1

## 2015-12-22 MED FILL — MECLIZINE 25 MG TABLET: 25 | 30 days supply | Qty: 45 | Fill #2 | Status: TO

## 2015-12-29 MED FILL — CARVEDILOL 12.5 MG TABLET: 12.5 | 90 days supply | Qty: 180 | Fill #2

## 2016-01-07 ENCOUNTER — Telehealth: Payer: Self-pay | Admitting: Cardiology

## 2016-01-07 MED FILL — CARVEDILOL 12.5 MG TABLET: 12.5 | 30 days supply | Qty: 60 | Fill #3

## 2016-01-07 NOTE — Telephone Encounter (Signed)
New Message  Pt c/o medication issue:  1. Name of Medication: Carvedilol  2. How are you currently taking this medication (dosage and times per day)? 12.5  3. Are you having a reaction (difficulty breathing--STAT)? Coughing, SOB, gasping for air  4. What is your medication issue? Pt had reaction to medication and would like to speak with RN

## 2016-01-07 NOTE — Telephone Encounter (Signed)
Spoke with pt, she reports she woke last night during the night SOB. She got up and drank some water and was able to return to sleep. After taking her medications this morning she again had a episode of feeling SOB, was gasping and felt she was having trouble breathing. [place] Stanford Breed was at that location and just recently found out we were not going to be there today. She denies swelling or chest pain. She does report the manufacturer of the carvedilol had changed and she started the different pill yesterday. She feels that the SOB she is having is related to the change. Spoke with the pharmacy and the patient will be able to get the previous tablets that she used. Patient is going to get the new script from the pharmacy and see if that makes a difference. I explained to her that we are not in [place] and if she has a problem she would need to go to the ER. Patient voiced understanding. She will call if symptoms continue or worsen. Pt agreed with this plan.

## 2016-01-08 MED FILL — FUROSEMIDE 20 MG TABLET: 20 | 30 days supply | Qty: 30 | Fill #2

## 2016-01-08 MED FILL — SPIRONOLACTONE 25 MG TABLET: 25 | 90 days supply | Qty: 45 | Fill #1 | Status: TO

## 2016-01-21 ENCOUNTER — Emergency Department (HOSPITAL_BASED_OUTPATIENT_CLINIC_OR_DEPARTMENT_OTHER): Payer: Medicare HMO

## 2016-01-21 ENCOUNTER — Observation Stay (HOSPITAL_BASED_OUTPATIENT_CLINIC_OR_DEPARTMENT_OTHER)
Admission: EM | Admit: 2016-01-21 | Discharge: 2016-01-23 | Disposition: A | Discharge status: Home / Self Care | Payer: Medicare HMO | Attending: Internal Medicine | Admitting: Internal Medicine

## 2016-01-21 ENCOUNTER — Encounter (HOSPITAL_BASED_OUTPATIENT_CLINIC_OR_DEPARTMENT_OTHER): Payer: Self-pay | Admitting: Emergency Medicine

## 2016-01-21 ENCOUNTER — Emergency Department (HOSPITAL_BASED_OUTPATIENT_CLINIC_OR_DEPARTMENT_OTHER)
Admission: EM | Admit: 2016-01-21 | Discharge: 2016-01-21 | Disposition: A | Discharge status: Home / Self Care | Payer: Medicare HMO | Source: Home / Self Care | Attending: Emergency Medicine | Admitting: Emergency Medicine

## 2016-01-21 DIAGNOSIS — R05 Cough: Secondary | ICD-10-CM | POA: Diagnosis not present

## 2016-01-21 DIAGNOSIS — Z79899 Other long term (current) drug therapy: Secondary | ICD-10-CM

## 2016-01-21 DIAGNOSIS — Z853 Personal history of malignant neoplasm of breast: Secondary | ICD-10-CM | POA: Insufficient documentation

## 2016-01-21 DIAGNOSIS — I071 Rheumatic tricuspid insufficiency: Secondary | ICD-10-CM | POA: Diagnosis not present

## 2016-01-21 DIAGNOSIS — M1712 Unilateral primary osteoarthritis, left knee: Secondary | ICD-10-CM | POA: Insufficient documentation

## 2016-01-21 DIAGNOSIS — I110 Hypertensive heart disease with heart failure: Secondary | ICD-10-CM

## 2016-01-21 DIAGNOSIS — H8110 Benign paroxysmal vertigo, unspecified ear: Secondary | ICD-10-CM | POA: Diagnosis not present

## 2016-01-21 DIAGNOSIS — I429 Cardiomyopathy, unspecified: Secondary | ICD-10-CM | POA: Insufficient documentation

## 2016-01-21 DIAGNOSIS — E119 Type 2 diabetes mellitus without complications: Secondary | ICD-10-CM

## 2016-01-21 DIAGNOSIS — Z888 Allergy status to other drugs, medicaments and biological substances status: Secondary | ICD-10-CM | POA: Diagnosis not present

## 2016-01-21 DIAGNOSIS — I447 Left bundle-branch block, unspecified: Secondary | ICD-10-CM | POA: Insufficient documentation

## 2016-01-21 DIAGNOSIS — I5022 Chronic systolic (congestive) heart failure: Secondary | ICD-10-CM

## 2016-01-21 DIAGNOSIS — E785 Hyperlipidemia, unspecified: Secondary | ICD-10-CM | POA: Insufficient documentation

## 2016-01-21 DIAGNOSIS — E876 Hypokalemia: Secondary | ICD-10-CM | POA: Diagnosis present

## 2016-01-21 DIAGNOSIS — Z950 Presence of cardiac pacemaker: Secondary | ICD-10-CM | POA: Insufficient documentation

## 2016-01-21 DIAGNOSIS — Z87891 Personal history of nicotine dependence: Secondary | ICD-10-CM | POA: Insufficient documentation

## 2016-01-21 DIAGNOSIS — Z90710 Acquired absence of both cervix and uterus: Secondary | ICD-10-CM | POA: Diagnosis not present

## 2016-01-21 DIAGNOSIS — K219 Gastro-esophageal reflux disease without esophagitis: Secondary | ICD-10-CM | POA: Insufficient documentation

## 2016-01-21 DIAGNOSIS — Z91013 Allergy to seafood: Secondary | ICD-10-CM | POA: Diagnosis not present

## 2016-01-21 DIAGNOSIS — Z8249 Family history of ischemic heart disease and other diseases of the circulatory system: Secondary | ICD-10-CM | POA: Diagnosis not present

## 2016-01-21 DIAGNOSIS — Z886 Allergy status to analgesic agent status: Secondary | ICD-10-CM | POA: Diagnosis not present

## 2016-01-21 DIAGNOSIS — Z880 Allergy status to penicillin: Secondary | ICD-10-CM | POA: Insufficient documentation

## 2016-01-21 DIAGNOSIS — I2510 Atherosclerotic heart disease of native coronary artery without angina pectoris: Secondary | ICD-10-CM | POA: Insufficient documentation

## 2016-01-21 DIAGNOSIS — Z95810 Presence of automatic (implantable) cardiac defibrillator: Secondary | ICD-10-CM | POA: Insufficient documentation

## 2016-01-21 DIAGNOSIS — K2100 Gastro-esophageal reflux disease with esophagitis, without bleeding: Secondary | ICD-10-CM

## 2016-01-21 DIAGNOSIS — Z86010 Personal history of colonic polyps: Secondary | ICD-10-CM | POA: Insufficient documentation

## 2016-01-21 DIAGNOSIS — R0600 Dyspnea, unspecified: Secondary | ICD-10-CM | POA: Diagnosis not present

## 2016-01-21 DIAGNOSIS — I10 Essential (primary) hypertension: Secondary | ICD-10-CM | POA: Diagnosis not present

## 2016-01-21 DIAGNOSIS — R55 Syncope and collapse: Secondary | ICD-10-CM | POA: Diagnosis not present

## 2016-01-21 DIAGNOSIS — R0602 Shortness of breath: Secondary | ICD-10-CM

## 2016-01-21 DIAGNOSIS — Z9889 Other specified postprocedural states: Secondary | ICD-10-CM | POA: Diagnosis not present

## 2016-01-21 DIAGNOSIS — R0989 Other specified symptoms and signs involving the circulatory and respiratory systems: Secondary | ICD-10-CM | POA: Diagnosis not present

## 2016-01-21 DIAGNOSIS — Z91041 Radiographic dye allergy status: Secondary | ICD-10-CM | POA: Diagnosis not present

## 2016-01-21 DIAGNOSIS — R069 Unspecified abnormalities of breathing: Secondary | ICD-10-CM | POA: Diagnosis not present

## 2016-01-21 DIAGNOSIS — I5042 Chronic combined systolic (congestive) and diastolic (congestive) heart failure: Secondary | ICD-10-CM | POA: Diagnosis present

## 2016-01-21 HISTORY — DX: Malignant neoplasm of unspecified site of unspecified female breast: C50.919

## 2016-01-21 HISTORY — DX: Heart failure, unspecified: I50.9

## 2016-01-21 HISTORY — DX: Lymphedema, not elsewhere classified: I89.0

## 2016-01-21 LAB — TROPONIN I
Troponin I: <0.03 ng/mL (ref <0.03)
Troponin I: <0.03 ng/mL (ref <0.03)
Troponin I: <0.03 ng/mL (ref <0.03)

## 2016-01-21 LAB — BASIC METABOLIC PANEL
Anion gap: 9 (ref 5–15)
BUN: 12 mg/dL (ref 6–20)
CHLORIDE: 105 mmol/L (ref 101–111)
CO2: 24 mmol/L (ref 22–32)
CREATININE: 0.78 mg/dL (ref 0.44–1.00)
Calcium: 9.6 mg/dL (ref 8.9–10.3)
GFR calc Af Amer: >60 mL/min (ref >60)
GFR calc non Af Amer: >60 mL/min (ref >60)
Glucose, Bld: 103 mg/dL — ABNORMAL HIGH (ref 65–99)
POTASSIUM: 3.4 mmol/L — AB (ref 3.5–5.1)
SODIUM: 138 mmol/L (ref 135–145)

## 2016-01-21 LAB — CBC
HEMATOCRIT: 36.5 % (ref 36.0–46.0)
Hemoglobin: 12 g/dL (ref 12.0–15.0)
MCH: 30.5 pg (ref 26.0–34.0)
MCHC: 32.9 g/dL (ref 30.0–36.0)
MCV: 92.9 fL (ref 78.0–100.0)
PLATELETS: 226 10*3/uL (ref 150–400)
RBC: 3.93 MIL/uL (ref 3.87–5.11)
RDW: 13.8 % (ref 11.5–15.5)
WBC: 7.5 10*3/uL (ref 4.0–10.5)

## 2016-01-21 MED ORDER — FAMOTIDINE 20 MG PO TABS
20.0000 mg | ORAL_TABLET | Freq: Two times a day (BID) | ORAL | Status: DC
  Administered 2016-01-22: 20 mg via ORAL
  Filled 2016-01-21: qty 1

## 2016-01-21 MED ORDER — ACETAMINOPHEN 325 MG PO TABS: 650.0000 mg | ORAL_TABLET | Freq: Four times a day (QID) | ORAL | Status: DC | PRN

## 2016-01-21 MED ORDER — ENOXAPARIN SODIUM 40 MG/0.4ML ~~LOC~~ SOLN
40.0000 mg | SUBCUTANEOUS | Status: DC
  Administered 2016-01-22 – 2016-01-23 (×2): 40 mg via SUBCUTANEOUS
  Filled 2016-01-21 (×2): qty 0.4

## 2016-01-21 MED ORDER — HYDROCODONE-ACETAMINOPHEN 5-325 MG PO TABS: 1.0000 | ORAL_TABLET | ORAL | Status: DC | PRN

## 2016-01-21 MED ORDER — BISACODYL 5 MG PO TBEC
5.0000 mg | DELAYED_RELEASE_TABLET | Freq: Every day | ORAL | Status: DC | PRN
  Administered 2016-01-22: 5 mg via ORAL
  Filled 2016-01-21: qty 1

## 2016-01-21 MED ORDER — SODIUM CHLORIDE 0.9% FLUSH
3.0000 mL | Freq: Two times a day (BID) | INTRAVENOUS | Status: DC
  Administered 2016-01-22 – 2016-01-23 (×2): 3 mL via INTRAVENOUS

## 2016-01-21 MED ORDER — CARVEDILOL 12.5 MG PO TABS
12.5000 mg | ORAL_TABLET | Freq: Two times a day (BID) | ORAL | Status: DC
  Administered 2016-01-22 – 2016-01-23 (×3): 12.5 mg via ORAL
  Filled 2016-01-21 (×4): qty 1

## 2016-01-21 MED ORDER — CHOLECALCIFEROL 10 MCG (400 UNIT) PO TABS
400.0000 [IU] | ORAL_TABLET | Freq: Every day | ORAL | Status: DC
  Administered 2016-01-22 – 2016-01-23 (×2): 400 [IU] via ORAL
  Filled 2016-01-21 (×2): qty 1

## 2016-01-21 MED ORDER — SODIUM CHLORIDE 0.9 % IV SOLN
250.0000 mL | INTRAVENOUS | Status: DC | PRN
  Administered 2016-01-21: 250 mL via INTRAVENOUS

## 2016-01-21 MED ORDER — LOSARTAN POTASSIUM 50 MG PO TABS
50.0000 mg | ORAL_TABLET | Freq: Every day | ORAL | Status: DC
  Administered 2016-01-22: 50 mg via ORAL
  Filled 2016-01-21: qty 1

## 2016-01-21 MED ORDER — POLYETHYLENE GLYCOL 3350 17 G PO PACK
17.0000 g | PACK | Freq: Every day | ORAL | Status: DC | PRN
Start: 2016-01-21 — End: 2016-01-23

## 2016-01-21 MED ORDER — ONDANSETRON HCL 4 MG PO TABS: 4.0000 mg | ORAL_TABLET | Freq: Four times a day (QID) | ORAL | Status: DC | PRN

## 2016-01-21 MED ORDER — FUROSEMIDE 20 MG PO TABS
20.0000 mg | ORAL_TABLET | Freq: Every day | ORAL | Status: DC
  Administered 2016-01-22 – 2016-01-23 (×2): 20 mg via ORAL
  Filled 2016-01-21 (×2): qty 1

## 2016-01-21 MED ORDER — SODIUM CHLORIDE 0.9% FLUSH: 3.0000 mL | INTRAVENOUS | Status: DC | PRN

## 2016-01-21 MED ORDER — ADULT MULTIVITAMIN W/MINERALS CH
1.0000 | ORAL_TABLET | Freq: Every day | ORAL | Status: DC
  Administered 2016-01-22 – 2016-01-23 (×2): 1 via ORAL
  Filled 2016-01-21 (×2): qty 1

## 2016-01-21 MED ORDER — MAGNESIUM SULFATE IN D5W 1-5 GM/100ML-% IV SOLN
1.0000 g | Freq: Once | INTRAVENOUS | Status: AC
  Administered 2016-01-21: 1 g via INTRAVENOUS
  Filled 2016-01-21: qty 100

## 2016-01-21 MED ORDER — POTASSIUM CHLORIDE CRYS ER 20 MEQ PO TBCR
40.0000 meq | EXTENDED_RELEASE_TABLET | Freq: Once | ORAL | Status: AC
  Administered 2016-01-21: 40 meq via ORAL
  Filled 2016-01-21: qty 2

## 2016-01-21 MED ORDER — SODIUM CHLORIDE 0.9% FLUSH
3.0000 mL | Freq: Two times a day (BID) | INTRAVENOUS | Status: DC
  Administered 2016-01-21 – 2016-01-23 (×4): 3 mL via INTRAVENOUS

## 2016-01-21 MED ORDER — ONDANSETRON HCL 4 MG/2ML IJ SOLN
4.0000 mg | Freq: Four times a day (QID) | INTRAMUSCULAR | Status: DC | PRN
  Administered 2016-01-22: 4 mg via INTRAVENOUS
  Filled 2016-01-21: qty 2

## 2016-01-21 MED ORDER — ACETAMINOPHEN 650 MG RE SUPP: 650.0000 mg | Freq: Four times a day (QID) | RECTAL | Status: DC | PRN

## 2016-01-21 MED ORDER — SPIRONOLACTONE 25 MG PO TABS
12.5000 mg | ORAL_TABLET | Freq: Every day | ORAL | Status: DC
  Administered 2016-01-22 – 2016-01-23 (×2): 12.5 mg via ORAL
  Filled 2016-01-21 (×2): qty 1

## 2016-01-21 MED ORDER — RANITIDINE HCL 150 MG PO TABS: 150.0000 mg | ORAL_TABLET | Freq: Every day | ORAL | 0 refills | Status: DC

## 2016-01-21 MED ORDER — MECLIZINE HCL 25 MG PO TABS: 12.5000 mg | ORAL_TABLET | Freq: Three times a day (TID) | ORAL | Status: DC | PRN

## 2016-01-21 NOTE — ED Notes (Signed)
Pt reports that she left the ER and went to church. She began to have a coughing spell. At that time she was gasping for air states "I could not breath. Next thing I know the EMS is there, I could hear everything but I couldn't;t breath" Denies LOC. Reports hx of PPM and CHF.

## 2016-01-21 NOTE — ED Notes (Addendum)
Arrived in room after pt's daughter called out. Pt appears as if she was just catching her breath. Pt's daughter who is a nurse was present at bedside and described that the pt looked as if she was gasping. Pt's SpO2 dropped to about 89% briefly before returning to 100%. Pt's daughter stated that episode was about 1 minute long. Pt A/Ox4 at the time of this RN's arrival. EDP notified.

## 2016-01-21 NOTE — ED Provider Notes (Signed)
[facility] Provider Note   CSN: [id number] Arrival date & time: 01/21/16  1141     History   Chief Complaint Chief Complaint  Patient presents with  . Shortness of Breath    HPI Brenda Marsh is a 71 y.o. female.  The history is provided by the patient. No language interpreter was used.  Shortness of Breath  This is a recurrent problem. The average episode lasts 2 minutes. The problem occurs intermittently.The current episode started less than 1 hour ago. The problem has been resolved. Associated symptoms include syncope. Pertinent negatives include no cough and no vomiting. She has tried nothing for the symptoms. Associated medical issues include CAD and heart failure. Associated medical issues do not include asthma.  Pt was seen earlier today for shortness of breath and difficulty breathing.  Pt was with Minister and passed out.  EMS responded and reported 02 sats in 40s.   Pt feels normal now.  Pt reports she feels something in her chest Pt reports it feels like a flutter no pain.  Daughter reports this is the 4th episode in 2 weeks.    Past Medical History:  Diagnosis Date  . AICD (automatic cardioverter/defibrillator) present   . Anemia 07/16/2012  . Arthritis    left knee pain, MRI in 2008-tricompartmental  degenerate changes, mucoid degeneration of ACL, post horn meniscal tear and ant horn meniscal tear  . Colon polyp    unclear pathology  . Diabetes mellitus type 2, diet-controlled ([place]) 03/11/2009   Qualifier: Diagnosis of  By: Wynona Luna  (boderline )  . History of breast cancer in female 1999   Breast; lumpectomy, radiation and chemotherapy  . Hyperlipidemia   . Hypertension   . LBBB (left bundle branch block)   . Low back pain    left L3-4 injected  . Nonischemic cardiomyopathy ([place])   . Presence of permanent cardiac pacemaker   . Travel sickness 05/12/2015  . Unspecified constipation 04/22/2012    Patient Active Problem List   Diagnosis  Date Noted  . Travel sickness 05/12/2015  . Benign paroxysmal positional vertigo 12/01/2014  . Nonischemic cardiomyopathy ([place])   . ICD (implantable cardioverter-defibrillator), biventricular, in situ 02/26/2014  . Chronic systolic CHF (congestive heart failure) ([place]) 11/16/2013  . Bilateral carotid artery disease ([place])   . Anemia 07/16/2012  . GERD 07/02/2009  . Hyperlipidemia, mixed 03/11/2009  . Essential hypertension 03/11/2009  . Osteoarthritis 03/11/2009  . Diabetes mellitus type 2, diet-controlled ([place]) 03/11/2009  . History of breast cancer 03/11/2009    Past Surgical History:  Procedure Laterality Date  . ABDOMINAL HYSTERECTOMY     Paritial  . APPENDECTOMY  2009   Dr. Lucia Gaskins  . BI-VENTRICULAR IMPLANTABLE CARDIOVERTER DEFIBRILLATOR N/A 11/16/2013   Procedure: BI-VENTRICULAR IMPLANTABLE CARDIOVERTER DEFIBRILLATOR  (CRT-D);  Surgeon: Evans Lance, MD;  Location: [facility] CATH LAB;  Service: Cardiovascular;  Laterality: N/A;  . BI-VENTRICULAR IMPLANTABLE CARDIOVERTER DEFIBRILLATOR  (CRT-D)  11/16/13   MDT CRTD implanted by Dr Lovena Le for NICM, CHF, and LBBB  . BREAST SURGERY     lumpectomy  . CARPAL TUNNEL RELEASE  2007   Dr. Daylene Katayama  . KNEE ARTHROSCOPY     Right  . TONSILLECTOMY      OB History    No data available       Home Medications    Prior to Admission medications   Medication Sig Start Date End Date Taking? Authorizing Provider  carvedilol (COREG) 12.5 MG tablet Take 1  tablet (12.5 mg total) by mouth 2 (two) times daily with a meal. 06/30/15   Lelon Perla, MD  Cholecalciferol (VITAMIN D) 400 UNITS capsule Take 400 Units by mouth daily.    Historical Provider, MD  furosemide (LASIX) 20 MG tablet TAKE 1 TABLET BY MOUTH DAILY 11/06/15   Lelon Perla, MD  losartan (COZAAR) 50 MG tablet TAKE 1 TABLET (50 MG TOTAL) BY MOUTH DAILY. 10/30/15   Lelon Perla, MD  meclizine (ANTIVERT) 12.5 MG tablet Take 1 tablet (12.5 mg total) by mouth 3 (three) times daily  as needed for dizziness. 08/08/15   [name], MD  Multiple Vitamin (MULTIVITAMIN) capsule Take 1 capsule by mouth daily.    Historical Provider, MD  ondansetron (ZOFRAN ODT) 8 MG disintegrating tablet Take 1 tablet (8 mg total) by mouth every 8 (eight) hours as needed for nausea or vomiting. 11/25/14   Barton Dubois, MD  ranitidine (ZANTAC) 150 MG tablet Take 1 tablet (150 mg total) by mouth at bedtime. 01/21/16   Tanna Furry, MD  spironolactone (ALDACTONE) 25 MG tablet TAKE 1/2 TABLET (12.5 MG TOTAL) BY MOUTH DAILY. 10/14/15   Lelon Perla, MD    Family History Family History  Problem Relation Age of Onset  . Heart attack Mother 77  . Diabetes Sister   . Diabetes Brother   . Coronary artery disease Other     Female first degree relative <60  . Hyperlipidemia Other   . Hypertension Other   . Cancer Other     prostate, 1st degree relative <50    Social History Social History  Substance Use Topics  . Smoking status: Former Smoker    Quit date: 01/18/1970  . Smokeless tobacco: Never Used  . Alcohol use No     Allergies   Contrast media [iodinated diagnostic agents]; Crestor [rosuvastatin]; Atorvastatin; Lisinopril; Naproxen sodium; Penicillins; Shellfish allergy; and Simvastatin   Review of Systems Review of Systems  Respiratory: Positive for shortness of breath. Negative for cough.   Cardiovascular: Positive for syncope.  Gastrointestinal: Negative for vomiting.  All other systems reviewed and are negative.    Physical Exam Updated Vital Signs BP 153/75 (BP Location: Right Arm)   Pulse 72   Temp 98.1 F (36.7 C) (Oral)   Resp 17   Ht 5\' 5"  (1.651 m)   Wt 97.5 kg   SpO2 100%   BMI 35.78 kg/m   Physical Exam  Constitutional: She appears well-developed and well-nourished. No distress.  HENT:  Head: Normocephalic and atraumatic.  Eyes: Conjunctivae are normal.  Neck: Neck supple.  Cardiovascular: Normal rate and regular rhythm.   No murmur  heard. Pulmonary/Chest: Effort normal and breath sounds normal. No respiratory distress.  Abdominal: Soft. There is no tenderness.  Musculoskeletal: She exhibits no edema.  Neurological: She is alert.  Skin: Skin is warm and dry.  Psychiatric: She has a normal mood and affect.  Nursing note and vitals reviewed.    ED Treatments / Results  Labs (all labs ordered are listed, but only abnormal results are displayed) Labs Reviewed  TROPONIN I    EKG  EKG Interpretation  Date/Time:  Wednesday January 21 2016 11:58:45 EST Ventricular Rate:  79 PR Interval:  126 QRS Duration: 128 QT Interval:  460 QTC Calculation: 527 R Axis:   -111 Text Interpretation:  Atrial-sensed ventricular-paced rhythm Biventricular pacemaker detected Abnormal ECG Confirmed by Hazle Coca [id number]) on 01/21/2016 12:05:54 PM       Radiology Dg Chest  2 View  Result Date: 01/21/2016 CLINICAL DATA:  Choking sensation, could not breathe EXAM: CHEST  2 VIEW COMPARISON:  11/17/2013 FINDINGS: There is a right-sided pacing device with grossly intact leads. There is no acute infiltrate, consolidation, or pleural effusion. Stable borderline cardiomegaly. No overt failure. IMPRESSION: No radiographic evidence for acute cardiopulmonary abnormality. Electronically Signed   By: Donavan Foil M.D.   On: 01/21/2016 03:20    Procedures Procedures (including critical care time)  Medications Ordered in ED Medications - No data to display   Initial Impression / Assessment and Plan / ED Course  I have reviewed the triage vital signs and the nursing notes.  Pertinent labs & imaging results that were available during my care of the patient were reviewed by me and considered in my medical decision making (see chart for details).  Clinical Course     Pt has no neuro deficits.  02 sat is 100 currently.  Pt had troponin negative x 2 this am.  Repeat 3rd troponin is normal.  Medtronic reports no vt or vf. No defib.    I spoke to  Dr. Claiborne [facility] Card[facility]  He advised Medicine admission.    Final Clinical Impressions(s) / ED Diagnoses   Final diagnoses:  Syncope and collapse    New Prescriptions New Prescriptions   No medications on file  I spoke to Dr. Frederic Jericho who will admit for observation   Fransico Meadow, PA-C 01/21/16 1628    Quintella Reichert, MD 01/22/16 [id number]

## 2016-01-21 NOTE — ED Triage Notes (Addendum)
Per EMS: Pt was discharged from [facility] ED this morning for non-productive cough/sob.Pt came back in today due to an episode of sob at church.  Pt has had cough and congestion episode last night. Pt was d/c'd with zantac which has not been filled yet. Lung sounds clear with EMS.  Pt alert and oriented at this time, in NAD.   Vitals WNL.

## 2016-01-21 NOTE — Progress Notes (Signed)
Patient presents with Dyspnea and cough this morning to the ED. Subsequently she was discharge home. She present again after a syncope episode. She was praying with her pastor when she pass out. She has had couple episodes where she is having dyspnea. Admission to telemetry for evaluation.  Brenda Regalado, md/

## 2016-01-21 NOTE — Discharge Instructions (Signed)
Take Zantac once per day. Follow up with your physician if not improving

## 2016-01-21 NOTE — ED Notes (Signed)
EKG given to Dr. Rees 

## 2016-01-21 NOTE — ED Triage Notes (Signed)
Patient states that she woke up about 40 minutes ago feeling like she is chocking

## 2016-01-21 NOTE — H&P (Signed)
History and Physical    Brenda Marsh [id number] DOB: Jul 16, 1945 DOA: 01/21/2016  PCP: [name], MD   Patient coming from: Home, by way of [facility]   Chief Complaint: Syncope   HPI: Brenda Marsh is a 71 y.o. female with medical history significant for hypertension, diet-controlled type 2 diabetes mellitus, and chronic combined systolic/diastolic CHF who presented to [place] emergency department following a syncopal event. Patient reports for the past several months, she has had for episodes of acute breathlessness with lightheadedness. She woke from her sleep overnight with one of these episodes and was evaluated emergency department at that time. She had negative serial troponin measurements and remains dramatically stable and free of any respiratory distress in the emergency department, was diagnosed with likely esophagitis, and discharge was entered. Patient returned to the ED later in the afternoon following the syncopal episode. The patient was at church, had an episode with acute dyspnea, and suffered brief loss of consciousness without convulsive activity, incontinence, or any injury. She felt well shortly after this, but returned to the emergency department for evaluation.  [facility] ED Course: Upon arrival to the [facility] ED, patient is found to be afebrile, saturating well on room air and with vitals otherwise stable. EKG features a biventricular pacing. Chest X-ray is negative for acute cardiopulmonary disease. Chemistry panel is notable only for mild hypokalemia to 3.4. CBC is unremarkable and troponin is undetectable over 3 serial measurements. Medtronic pacer was interrogated and there had been no V. tach, fibrillation or defibrillation. Patient remained hemodynamically stable in the emergency department and is not in any respiratory distress. She will be admitted to the telemetry unit at [facility] for ongoing evaluation and management of a syncopal  episode, concerning for possible cardiac syncope.  Review of Systems:  All other systems reviewed and apart from HPI, are negative.  Past Medical History:  Diagnosis Date  . AICD (automatic cardioverter/defibrillator) present   . Anemia 07/16/2012  . Arthritis    left knee pain, MRI in 2008-tricompartmental  degenerate changes, mucoid degeneration of ACL, post horn meniscal tear and ant horn meniscal tear  . Colon polyp    unclear pathology  . Diabetes mellitus type 2, diet-controlled ([place]) 03/11/2009   Qualifier: Diagnosis of  By: Wynona Luna  (boderline )  . History of breast cancer in female 1999   Breast; lumpectomy, radiation and chemotherapy  . Hyperlipidemia   . Hypertension   . LBBB (left bundle branch block)   . Low back pain    left L3-4 injected  . Nonischemic cardiomyopathy ([place])   . Presence of permanent cardiac pacemaker   . Travel sickness 05/12/2015  . Unspecified constipation 04/22/2012    Past Surgical History:  Procedure Laterality Date  . ABDOMINAL HYSTERECTOMY     Paritial  . APPENDECTOMY  2009   Dr. Lucia Gaskins  . BI-VENTRICULAR IMPLANTABLE CARDIOVERTER DEFIBRILLATOR N/A 11/16/2013   Procedure: BI-VENTRICULAR IMPLANTABLE CARDIOVERTER DEFIBRILLATOR  (CRT-D);  Surgeon: Evans Lance, MD;  Location: [facility] CATH LAB;  Service: Cardiovascular;  Laterality: N/A;  . BI-VENTRICULAR IMPLANTABLE CARDIOVERTER DEFIBRILLATOR  (CRT-D)  11/16/13   MDT CRTD implanted by Dr Lovena Le for NICM, CHF, and LBBB  . BREAST SURGERY     lumpectomy  . CARPAL TUNNEL RELEASE  2007   Dr. Daylene Katayama  . KNEE ARTHROSCOPY     Right  . TONSILLECTOMY       reports that she quit smoking about 46 years ago. She has  never used smokeless tobacco. She reports that she does not drink alcohol or use drugs.  Allergies: contrast, penicillin, statin, lisinopril, shellfish, Naproxen  Family History  Problem Relation Age of Onset  . Heart attack Mother 20  . Diabetes Sister   . Diabetes Brother   .  Coronary artery disease Other     Female first degree relative <60  . Hyperlipidemia Other   . Hypertension Other   . Cancer Other     prostate, 1st degree relative <50     Prior to Admission medications   Medication Sig Start Date End Date Taking? Authorizing Provider  carvedilol (COREG) 12.5 MG tablet Take 1 tablet (12.5 mg total) by mouth 2 (two) times daily with a meal. 06/30/15   Lelon Perla, MD  Cholecalciferol (VITAMIN D) 400 UNITS capsule Take 400 Units by mouth daily.    Historical Provider, MD  furosemide (LASIX) 20 MG tablet TAKE 1 TABLET BY MOUTH DAILY 11/06/15   Lelon Perla, MD  losartan (COZAAR) 50 MG tablet TAKE 1 TABLET (50 MG TOTAL) BY MOUTH DAILY. 10/30/15   Lelon Perla, MD  meclizine (ANTIVERT) 12.5 MG tablet Take 1 tablet (12.5 mg total) by mouth 3 (three) times daily as needed for dizziness. 08/08/15   [name], MD  Multiple Vitamin (MULTIVITAMIN) capsule Take 1 capsule by mouth daily.    Historical Provider, MD  ondansetron (ZOFRAN ODT) 8 MG disintegrating tablet Take 1 tablet (8 mg total) by mouth every 8 (eight) hours as needed for nausea or vomiting. 11/25/14   Barton Dubois, MD  ranitidine (ZANTAC) 150 MG tablet Take 1 tablet (150 mg total) by mouth at bedtime. 01/21/16   Tanna Furry, MD  spironolactone (ALDACTONE) 25 MG tablet TAKE 1/2 TABLET (12.5 MG TOTAL) BY MOUTH DAILY. 10/14/15   Lelon Perla, MD    Physical Exam: Vitals:   01/21/16 1630 01/21/16 1700 01/21/16 1741 01/21/16 1832  BP: 183/80 169/79 152/82 (!) 143/53  Pulse: 84 77 81 80  Resp: 15 13 15 20   Temp:    97.8 F (36.6 C)  TempSrc:    Oral  SpO2: 100% 99% 98% 100%  Weight:    95.4 kg (210 lb 5.1 oz)  Height:    5\' 5"  (1.651 m)      Constitutional: NAD, calm, comfortable Eyes: PERTLA, lids and conjunctivae normal ENMT: Mucous membranes are moist. Posterior pharynx clear of any exudate or lesions.   Neck: normal, supple, no masses, no thyromegaly Respiratory: clear to  auscultation bilaterally, no wheezing, no crackles.   Cardiovascular: S1 & S2 heard, regular rate and rhythm. No extremity edema. No significant JVD. Abdomen: No distension, no tenderness, no masses palpated. Bowel sounds normal.  Musculoskeletal: no clubbing / cyanosis. No joint deformity upper and lower extremities. Normal muscle tone.  Skin: no significant rashes, lesions, ulcers. Warm, dry, well-perfused. Neurologic: CN 2-12 grossly intact. Sensation intact, DTR normal. Strength 5/5 in all 4 limbs.  Psychiatric: Normal judgment and insight. Alert and oriented x 3. Normal mood and affect.     Labs on Admission: I have personally reviewed following labs and imaging studies  CBC:  Recent Labs Lab 01/21/16 0227  WBC 7.5  HGB 12.0  HCT 36.5  MCV 92.9  PLT [id number]   Basic Metabolic Panel:  Recent Labs Lab 01/21/16 0227  NA 138  K 3.4*  CL 105  CO2 24  GLUCOSE 103*  BUN 12  CREATININE 0.78  CALCIUM 9.6   GFR:  Estimated Creatinine Clearance (by C-G formula based on SCr of 0.78 mg/dL) Female: 74.8 mL/min Female: 91.3 mL/min Liver Function Tests: No results for input(s): AST, ALT, ALKPHOS, BILITOT, PROT, ALBUMIN in the last 168 hours. No results for input(s): LIPASE, AMYLASE in the last 168 hours. No results for input(s): AMMONIA in the last 168 hours. Coagulation Profile: No results for input(s): INR, PROTIME in the last 168 hours. Cardiac Enzymes:  Recent Labs Lab 01/21/16 0227 01/21/16 0514 01/21/16 1353  TROPONINI <0.03 <0.03 <0.03   BNP (last 3 results) No results for input(s): PROBNP in the last 8760 hours. HbA1C: No results for input(s): HGBA1C in the last 72 hours. CBG: No results for input(s): GLUCAP in the last 168 hours. Lipid Profile: No results for input(s): CHOL, HDL, LDLCALC, TRIG, CHOLHDL, LDLDIRECT in the last 72 hours. Thyroid Function Tests: No results for input(s): TSH, T4TOTAL, FREET4, T3FREE, THYROIDAB in the last 72 hours. Anemia Panel: No  results for input(s): VITAMINB12, FOLATE, FERRITIN, TIBC, IRON, RETICCTPCT in the last 72 hours. Urine analysis:    Component Value Date/Time   COLORURINE YELLOW 11/23/2014 1659   APPEARANCEUR CLOUDY (A) 11/23/2014 1659   LABSPEC 1.016 11/23/2014 1659   PHURINE 6.0 11/23/2014 1659   GLUCOSEU NEGATIVE 11/23/2014 1659   HGBUR NEGATIVE 11/23/2014 1659   HGBUR moderate 04/29/2009 1025   BILIRUBINUR NEGATIVE 11/23/2014 1659   KETONESUR NEGATIVE 11/23/2014 1659   PROTEINUR NEGATIVE 11/23/2014 1659   UROBILINOGEN 0.2 11/23/2014 1659   NITRITE NEGATIVE 11/23/2014 1659   LEUKOCYTESUR SMALL (A) 11/23/2014 1659   Sepsis Labs: @LABRCNTIP (procalcitonin:4,lacticidven:4) )No results found for this or any previous visit (from the past 240 hour(s)).   Radiological Exams on Admission: Dg Chest 2 View  Result Date: 01/21/2016 CLINICAL DATA:  Choking sensation, could not breathe EXAM: CHEST  2 VIEW COMPARISON:  11/17/2013 FINDINGS: There is a right-sided pacing device with grossly intact leads. There is no acute infiltrate, consolidation, or pleural effusion. Stable borderline cardiomegaly. No overt failure. IMPRESSION: No radiographic evidence for acute cardiopulmonary abnormality. Electronically Signed   By: Donavan Foil M.D.   On: 01/21/2016 03:20    EKG: Independently reviewed. A-paced, V-sensed   Assessment/Plan  1. Syncope  - The patient reports a syncopal episode just prior to arrival  - She has Medtronic biventricular ICD, interrogated at [facility] with no VT, VF, or defibrillations  - She has remained hemodynamically stable and back to her usual state since arrival in ED  - Troponin was undetectable x3 at the outside hospital  - There is no HA, vision or hearing change, or focal neuro deficit to suggest CNS lesion - No hypoxia, cough, tachycardia, or leg swelling to suggest PE  - Plan to monitor on telemetry, check orthostatics, obtain TTE   2. Chronic combined systolic/diastolic CHF  - Pt  appears euvolemic on presentation - TTE (11/25/14) with EF [id number], grade 1 diastolic dysfunction, inferoseptal HK, no significant valvular disease  - She is managed with Lasix, Aldactone, losartan, and Coreg at home; plan to continue as tolerated  - TTE pending as above  3. Hypokalemia  - Serum potassium 3.4 on admission, treated with 40 mEq oral potassium and 1 g magnesium  - Repeat chem panel in am    DVT prophylaxis: sq Lovenox  Code Status: Full Family Communication: Daughter updated at bedside at patient's request Disposition Plan: Observe on telemetry Consults called: None Admission status: Observation    Vianne Bulls, MD [facility] Pager [phone]  If 7PM-7AM, please contact  night-coverage [email or web address] Password [employer]  01/21/2016, 7:48 PM

## 2016-01-21 NOTE — ED Notes (Signed)
ED Provider at bedside. 

## 2016-01-21 NOTE — ED Provider Notes (Signed)
[place] DEPT [facility] Provider Note   CSN: [id number] Arrival date & time: 01/21/16  0205     History   Chief Complaint Chief Complaint  Patient presents with  . Shortness of Breath    HPI Brenda Marsh is a 71 y.o. female.  She presents for "choking". States that she awakened from sleep at 2 AM this morning with a feeling like she couldn't breathe. She felt like there were "peppers in here". She describes a feeling of hot peppers in her chest but states it was not a burning feeling. It was not painful. States that she became very anxious. This is the fourth time this is happened. This has been very infrequent over the last few months  HPI  Past Medical History:  Diagnosis Date  . AICD (automatic cardioverter/defibrillator) present   . Anemia 07/16/2012  . Arthritis    left knee pain, MRI in 2008-tricompartmental  degenerate changes, mucoid degeneration of ACL, post horn meniscal tear and ant horn meniscal tear  . Colon polyp    unclear pathology  . Diabetes mellitus type 2, diet-controlled ([place]) 03/11/2009   Qualifier: Diagnosis of  By: Wynona Luna  (boderline )  . History of breast cancer in female 1999   Breast; lumpectomy, radiation and chemotherapy  . Hyperlipidemia   . Hypertension   . LBBB (left bundle branch block)   . Low back pain    left L3-4 injected  . Nonischemic cardiomyopathy ([place])   . Presence of permanent cardiac pacemaker   . Travel sickness 05/12/2015  . Unspecified constipation 04/22/2012    Patient Active Problem List   Diagnosis Date Noted  . Travel sickness 05/12/2015  . Benign paroxysmal positional vertigo 12/01/2014  . Nonischemic cardiomyopathy ([place])   . ICD (implantable cardioverter-defibrillator), biventricular, in situ 02/26/2014  . Chronic systolic CHF (congestive heart failure) ([place]) 11/16/2013  . Bilateral carotid artery disease ([place])   . Anemia 07/16/2012  . GERD 07/02/2009  . Hyperlipidemia, mixed 03/11/2009  .  Essential hypertension 03/11/2009  . Osteoarthritis 03/11/2009  . Diabetes mellitus type 2, diet-controlled ([place]) 03/11/2009  . History of breast cancer 03/11/2009    Past Surgical History:  Procedure Laterality Date  . ABDOMINAL HYSTERECTOMY     Paritial  . APPENDECTOMY  2009   Dr. Lucia Gaskins  . BI-VENTRICULAR IMPLANTABLE CARDIOVERTER DEFIBRILLATOR N/A 11/16/2013   Procedure: BI-VENTRICULAR IMPLANTABLE CARDIOVERTER DEFIBRILLATOR  (CRT-D);  Surgeon: Evans Lance, MD;  Location: [facility] CATH LAB;  Service: Cardiovascular;  Laterality: N/A;  . BI-VENTRICULAR IMPLANTABLE CARDIOVERTER DEFIBRILLATOR  (CRT-D)  11/16/13   MDT CRTD implanted by Dr Lovena Le for NICM, CHF, and LBBB  . BREAST SURGERY     lumpectomy  . CARPAL TUNNEL RELEASE  2007   Dr. Daylene Katayama  . KNEE ARTHROSCOPY     Right  . TONSILLECTOMY      OB History    No data available       Home Medications    Prior to Admission medications   Medication Sig Start Date End Date Taking? Authorizing Provider  carvedilol (COREG) 12.5 MG tablet Take 1 tablet (12.5 mg total) by mouth 2 (two) times daily with a meal. 06/30/15   Lelon Perla, MD  Cholecalciferol (VITAMIN D) 400 UNITS capsule Take 400 Units by mouth daily.    Historical Provider, MD  furosemide (LASIX) 20 MG tablet TAKE 1 TABLET BY MOUTH DAILY 11/06/15   Lelon Perla, MD  losartan (COZAAR) 50 MG tablet TAKE 1 TABLET (50  MG TOTAL) BY MOUTH DAILY. 10/30/15   Lelon Perla, MD  meclizine (ANTIVERT) 12.5 MG tablet Take 1 tablet (12.5 mg total) by mouth 3 (three) times daily as needed for dizziness. 08/08/15   [name], MD  Multiple Vitamin (MULTIVITAMIN) capsule Take 1 capsule by mouth daily.    Historical Provider, MD  ondansetron (ZOFRAN ODT) 8 MG disintegrating tablet Take 1 tablet (8 mg total) by mouth every 8 (eight) hours as needed for nausea or vomiting. 11/25/14   Barton Dubois, MD  ranitidine (ZANTAC) 150 MG tablet Take 1 tablet (150 mg total) by mouth at  bedtime. 01/21/16   Tanna Furry, MD  spironolactone (ALDACTONE) 25 MG tablet TAKE 1/2 TABLET (12.5 MG TOTAL) BY MOUTH DAILY. 10/14/15   Lelon Perla, MD    Family History Family History  Problem Relation Age of Onset  . Heart attack Mother 28  . Diabetes Sister   . Diabetes Brother   . Coronary artery disease Other     Female first degree relative <60  . Hyperlipidemia Other   . Hypertension Other   . Cancer Other     prostate, 1st degree relative <50    Social History Social History  Substance Use Topics  . Smoking status: Former Smoker    Quit date: 01/18/1970  . Smokeless tobacco: Never Used  . Alcohol use No     Allergies   Contrast media [iodinated diagnostic agents]; Crestor [rosuvastatin]; Atorvastatin; Lisinopril; Naproxen sodium; Penicillins; Shellfish allergy; and Simvastatin   Review of Systems Review of Systems  Constitutional: Negative for appetite change, chills, diaphoresis, fatigue and fever.  HENT: Negative for mouth sores, sore throat and trouble swallowing.   Eyes: Negative for visual disturbance.  Respiratory: Negative for cough, chest tightness, shortness of breath and wheezing.   Cardiovascular: Negative for chest pain.       "Pepper" feeling in the middle of her chest  Gastrointestinal: Negative for abdominal distention, abdominal pain, diarrhea, nausea and vomiting.  Endocrine: Negative for polydipsia, polyphagia and polyuria.  Genitourinary: Negative for dysuria, frequency and hematuria.  Musculoskeletal: Negative for gait problem.  Skin: Negative for color change, pallor and rash.  Neurological: Negative for dizziness, syncope, light-headedness and headaches.  Hematological: Does not bruise/bleed easily.  Psychiatric/Behavioral: Negative for behavioral problems and confusion.     Physical Exam Updated Vital Signs BP 119/61 (BP Location: Right Arm)   Pulse 78   Temp 98.3 F (36.8 C) (Oral)   Resp 18   Ht 5' 5.5" (1.664 m)   Wt 215 lb  (97.5 kg)   SpO2 99%   BMI 35.23 kg/m   Physical Exam  Constitutional: She is oriented to person, place, and time. She appears well-developed and well-nourished. No distress.  HENT:  Head: Normocephalic.  Eyes: Conjunctivae are normal. Pupils are equal, round, and reactive to light. No scleral icterus.  Neck: Normal range of motion. Neck supple. No thyromegaly present.  Cardiovascular: Normal rate and regular rhythm.  Exam reveals no gallop and no friction rub.   No murmur heard. Pulmonary/Chest: Effort normal and breath sounds normal. No respiratory distress. She has no wheezes. She has no rales.  Abdominal: Soft. Bowel sounds are normal. She exhibits no distension. There is no tenderness. There is no rebound.  Musculoskeletal: Normal range of motion.  Neurological: She is alert and oriented to person, place, and time.  Skin: Skin is warm and dry. No rash noted.  Psychiatric: Her behavior is normal. Her mood appears anxious.  ED Treatments / Results  Labs (all labs ordered are listed, but only abnormal results are displayed) Labs Reviewed  BASIC METABOLIC PANEL - Abnormal; Notable for the following:       Result Value   Potassium 3.4 (*)    Glucose, Bld 103 (*)    All other components within normal limits  CBC  TROPONIN I  TROPONIN I    EKG  EKG Interpretation None       Radiology Dg Chest 2 View  Result Date: 01/21/2016 CLINICAL DATA:  Choking sensation, could not breathe EXAM: CHEST  2 VIEW COMPARISON:  11/17/2013 FINDINGS: There is a right-sided pacing device with grossly intact leads. There is no acute infiltrate, consolidation, or pleural effusion. Stable borderline cardiomegaly. No overt failure. IMPRESSION: No radiographic evidence for acute cardiopulmonary abnormality. Electronically Signed   By: Donavan Foil M.D.   On: 01/21/2016 03:20    Procedures Procedures (including critical care time)  Medications Ordered in ED Medications - No data to  display   Initial Impression / Assessment and Plan / ED Course  I have reviewed the triage vital signs and the nursing notes.  Pertinent labs & imaging results that were available during my care of the patient were reviewed by me and considered in my medical decision making (see chart for details).  Clinical Course     EKG shows paced rhythm. Initial troponin normal. Would obtain three-hour level. A discussion with her that her episodes sound quite like esophagitis with him describing up Pepper feeling occasionally a sour taste in her house with sudden episodes of coughing at night area did clinically and radiographically not in CHF. Doubt N- STEMI. Likely esophagitis. Patient will not allow me to give her a PPI she states "I saw on TV that all of those purple pills were recalled:". Attempted to redirect her that these are not recalled, and are used every day. However she is hesitant, and still declines.  Second troponin is normal. She remains asymptomatic. We discussed using Zantac as an initial treatment and she is comfortable with this plan. She is appropriate for discharge home.  Final Clinical Impressions(s) / ED Diagnoses   Final diagnoses:  GERD with esophagitis    New Prescriptions New Prescriptions   RANITIDINE (ZANTAC) 150 MG TABLET    Take 1 tablet (150 mg total) by mouth at bedtime.     Tanna Furry, MD 01/21/16 [phone]

## 2016-01-22 ENCOUNTER — Observation Stay (HOSPITAL_COMMUNITY): Payer: Medicare HMO

## 2016-01-22 ENCOUNTER — Ambulatory Visit (HOSPITAL_COMMUNITY): Payer: Medicare HMO

## 2016-01-22 DIAGNOSIS — I5042 Chronic combined systolic (congestive) and diastolic (congestive) heart failure: Secondary | ICD-10-CM

## 2016-01-22 DIAGNOSIS — R0600 Dyspnea, unspecified: Secondary | ICD-10-CM

## 2016-01-22 DIAGNOSIS — R55 Syncope and collapse: Secondary | ICD-10-CM

## 2016-01-22 DIAGNOSIS — R0602 Shortness of breath: Secondary | ICD-10-CM | POA: Diagnosis not present

## 2016-01-22 LAB — BASIC METABOLIC PANEL
ANION GAP: 8 (ref 5–15)
BUN: 10 mg/dL (ref 6–20)
CALCIUM: 9.5 mg/dL (ref 8.9–10.3)
CO2: 25 mmol/L (ref 22–32)
Chloride: 105 mmol/L (ref 101–111)
Creatinine, Ser: 0.79 mg/dL (ref 0.44–1.00)
GFR calc Af Amer: >60 mL/min (ref >60)
GLUCOSE: 92 mg/dL (ref 65–99)
Potassium: 4.1 mmol/L (ref 3.5–5.1)
SODIUM: 138 mmol/L (ref 135–145)

## 2016-01-22 LAB — D-DIMER, QUANTITATIVE (NOT AT ARMC): D DIMER QUANT: 0.8 ug{FEU}/mL — AB (ref 0.00–0.50)

## 2016-01-22 LAB — MAGNESIUM: MAGNESIUM: 2.4 mg/dL (ref 1.7–2.4)

## 2016-01-22 MED ORDER — TECHNETIUM TC 99M DIETHYLENETRIAME-PENTAACETIC ACID: 31.0000 | Freq: Once | INTRAVENOUS | Status: DC | PRN

## 2016-01-22 MED ORDER — TECHNETIUM TO 99M ALBUMIN AGGREGATED
4.0000 | Freq: Once | INTRAVENOUS | Status: AC | PRN
  Administered 2016-01-22: 4 via INTRAVENOUS

## 2016-01-22 MED ORDER — PANTOPRAZOLE SODIUM 40 MG PO TBEC
40.0000 mg | DELAYED_RELEASE_TABLET | Freq: Two times a day (BID) | ORAL | Status: DC
  Administered 2016-01-22 – 2016-01-23 (×3): 40 mg via ORAL
  Filled 2016-01-22 (×3): qty 1

## 2016-01-22 NOTE — Progress Notes (Signed)
As per order, orthostatic vital signs have been done, and documented in Doc Flowsheets 01/22/16 @ 1140.

## 2016-01-22 NOTE — Consult Note (Signed)
   [facility] CM Inpatient Consult   01/22/2016  Brenda Marsh 01/02/1946 [id number]   Patient was screened for HF admission History with her [employer] plan.  Chart review reveals the patient is Brenda Marsh is a 71 y.o. female with medical history significant for hypertension, diet-controlled type 2 diabetes mellitus, and chronic combined systolic/diastolic CHF who presented to [place] emergency department following a syncopal event. Met with the patient patient confirms that she no longer has [employer] insurance but has [employer] PPO.  Patient is currently no eligible for [facility] Care Management services.  Patient verbalized understanding with the insurance change.  For questions, please contact:  Natividad Brood, RN BSN [facility] Liaison  [phone] business mobile phone Toll free office [phone]

## 2016-01-22 NOTE — Progress Notes (Signed)
PROGRESS NOTE    LORETA ROSI  [id number] DOB: October 05, 1945 DOA: 01/21/2016 PCP: [name], MD    Brief Narrative:  Brenda Marsh is a 71 y.o. female with medical history significant for hypertension, diet-controlled type 2 diabetes mellitus, and chronic combined systolic/diastolic CHF who presented to [place] emergency department following a syncopal event. Patient reports for the past several months, she has had for episodes of acute breathlessness with lightheadedness.    Assessment & Plan:   Principal Problem:   Syncope Active Problems:   Essential hypertension   Diabetes mellitus type 2, diet-controlled (HCC)   Hypokalemia   Chronic combined systolic and diastolic CHF (congestive heart failure) (HCC)   Benign paroxysmal positional vertigo   1-Dyspnea;  Troponin negative, Chest x ray negative for HF Unclear etiology.  Bronchospasm ?  Stop Cozaar.  D dimer mildly elevated. Will check V-Q scan and doppler.  Cardiology consulted, Is this angina equivalent ?.  ECHO pending.   2-Syncope;  D dimer mildly elevated. Check VQ scan.  Orthostatic vitals; negative.  Cardiology consulted.  Pacer was interrogated, no arrhythmia documented.   Chronic combined systolic/diastolic CHF  Continue with Lasix, Aldactone,and Coreg Hold losartan for now,.   DVT prophylaxis: Lovenox Code Status; full code.  Family Communication: care discussed with patient.  Disposition Plan: remain inpatient for further work up of syncope, she will need V-Q scan and doppler LE>   Consultants:   Cardiology    Procedures:  ECHO pending.    Antimicrobials:   none   Subjective: She has been having dyspnea on and off for 2 weeks, gets scratchy in her throat. Pass out yesterday.   Objective: Vitals:   01/21/16 2343 01/21/16 2344 01/22/16 0455 01/22/16 0950  BP: (!) 150/77 (!) 144/81 118/61 126/72  Pulse: 79 84 76 78  Resp: 18 18 18 20   Temp: 98.2 F (36.8  C)  98.7 F (37.1 C) 98.2 F (36.8 C)  TempSrc: Oral  Oral Oral  SpO2:   95% 100%  Weight:   94.9 kg (209 lb 3.5 oz)   Height:        Intake/Output Summary (Last 24 hours) at 01/22/16 1352 Last data filed at 01/22/16 1336  Gross per 24 hour  Intake          1099.33 ml  Output                0 ml  Net          1099.33 ml   Filed Weights   01/21/16 1146 01/21/16 1832 01/22/16 0455  Weight: 97.5 kg (215 lb) 95.4 kg (210 lb 5.1 oz) 94.9 kg (209 lb 3.5 oz)    Examination:  General exam: Appears calm and comfortable  Respiratory system: Clear to auscultation. Respiratory effort normal. Cardiovascular system: S1 & S2 heard, RRR. No JVD, murmurs, rubs, gallops or clicks. No pedal edema. Gastrointestinal system: Abdomen is nondistended, soft and nontender. No organomegaly or masses felt. Normal bowel sounds heard. Central nervous system: Alert and oriented. No focal neurological deficits. Extremities: Symmetric 5 x 5 power. Skin: No rashes, lesions or ulcers Psychiatry: Judgement and insight appear normal. Mood & affect appropriate.     Data Reviewed: I have personally reviewed following labs and imaging studies  CBC:  Recent Labs Lab 01/21/16 0227  WBC 7.5  HGB 12.0  HCT 36.5  MCV 92.9  PLT [id number]   Basic Metabolic Panel:  Recent Labs Lab 01/21/16 0227 01/22/16 0430  NA 138 138  K 3.4* 4.1  CL 105 105  CO2 24 25  GLUCOSE 103* 92  BUN 12 10  CREATININE 0.78 0.79  CALCIUM 9.6 9.5  MG  --  2.4   GFR: Estimated Creatinine Clearance (by C-G formula based on SCr of 0.79 mg/dL) Female: 74.6 mL/min Female: 91 mL/min Liver Function Tests: No results for input(s): AST, ALT, ALKPHOS, BILITOT, PROT, ALBUMIN in the last 168 hours. No results for input(s): LIPASE, AMYLASE in the last 168 hours. No results for input(s): AMMONIA in the last 168 hours. Coagulation Profile: No results for input(s): INR, PROTIME in the last 168 hours. Cardiac Enzymes:  Recent Labs Lab  01/21/16 0227 01/21/16 0514 01/21/16 1353  TROPONINI <0.03 <0.03 <0.03   BNP (last 3 results) No results for input(s): PROBNP in the last 8760 hours. HbA1C: No results for input(s): HGBA1C in the last 72 hours. CBG: No results for input(s): GLUCAP in the last 168 hours. Lipid Profile: No results for input(s): CHOL, HDL, LDLCALC, TRIG, CHOLHDL, LDLDIRECT in the last 72 hours. Thyroid Function Tests: No results for input(s): TSH, T4TOTAL, FREET4, T3FREE, THYROIDAB in the last 72 hours. Anemia Panel: No results for input(s): VITAMINB12, FOLATE, FERRITIN, TIBC, IRON, RETICCTPCT in the last 72 hours. Sepsis Labs: No results for input(s): PROCALCITON, LATICACIDVEN in the last 168 hours.  No results found for this or any previous visit (from the past 240 hour(s)).       Radiology Studies: Dg Chest 2 View  Result Date: 01/21/2016 CLINICAL DATA:  Choking sensation, could not breathe EXAM: CHEST  2 VIEW COMPARISON:  11/17/2013 FINDINGS: There is a right-sided pacing device with grossly intact leads. There is no acute infiltrate, consolidation, or pleural effusion. Stable borderline cardiomegaly. No overt failure. IMPRESSION: No radiographic evidence for acute cardiopulmonary abnormality. Electronically Signed   By: Donavan Foil M.D.   On: 01/21/2016 03:20        Scheduled Meds: . carvedilol  12.5 mg Oral BID WC  . cholecalciferol  400 Units Oral Daily  . enoxaparin (LOVENOX) injection  40 mg Subcutaneous Q24H  . furosemide  20 mg Oral Daily  . multivitamin with minerals  1 tablet Oral Daily  . pantoprazole  40 mg Oral BID  . sodium chloride flush  3 mL Intravenous Q12H  . sodium chloride flush  3 mL Intravenous Q12H  . spironolactone  12.5 mg Oral Daily   Continuous Infusions:   LOS: 0 days    Time spent: 35 minutes.     Elmarie Shiley, MD [facility] Pager [phone]  If 7PM-7AM, please contact night-coverage [email or web address] Password [email or web address] 01/22/2016,  1:52 PM

## 2016-01-22 NOTE — Consult Note (Addendum)
Cardiology Consult    Patient ID: Brenda Marsh MRN: [id number], DOB/AGE: Dec 19, 1945   Admit date: 01/21/2016 Date of Consult: 01/22/2016  Primary Physician: [name], MD Primary Cardiologist: Dr. Magnus Sinning Requesting Provider: Dr. Tyrell Antonio Reason for Consultation: Dyspnea/Syncope  Patient Profile    71 yo female with PMH of NICM s/p AICD, HTN, HLD, NIDDM, Breast Ca who presented to [place] with dyspnea and near syncope.   Past Medical History   Past Medical History:  Diagnosis Date  . AICD (automatic cardioverter/defibrillator) present   . Anemia 07/16/2012  . Arthritis    left knee pain, MRI in 2008-tricompartmental  degenerate changes, mucoid degeneration of ACL, post horn meniscal tear and ant horn meniscal tear  . Breast cancer in female [facility]) 2000   left; S/P lumpectomy, radiation and chemotherapy  . CHF (congestive heart failure) ([place])   . Colon polyp    unclear pathology  . Diabetes mellitus type 2, diet-controlled ([place]) 03/11/2009   Qualifier: Diagnosis of  By: Wynona Luna  (boderline )  . Hyperlipidemia   . Hypertension   . LBBB (left bundle branch block)   . Low back pain    left L3-4 injected  . Lymphedema of left arm   . Nonischemic cardiomyopathy ([place])   . Presence of permanent cardiac pacemaker   . Travel sickness 05/12/2015  . Unspecified constipation 04/22/2012    Past Surgical History:  Procedure Laterality Date  . APPENDECTOMY  2009   Dr. Lucia Gaskins  . BI-VENTRICULAR IMPLANTABLE CARDIOVERTER DEFIBRILLATOR N/A 11/16/2013   Procedure: BI-VENTRICULAR IMPLANTABLE CARDIOVERTER DEFIBRILLATOR  (CRT-D);  Surgeon: Evans Lance, MD;  Location: [facility] CATH LAB;  Service: Cardiovascular;  Laterality: N/A;  . BI-VENTRICULAR IMPLANTABLE CARDIOVERTER DEFIBRILLATOR  (CRT-D)  11/16/13   MDT CRTD implanted by Dr Lovena Le for NICM, CHF, and LBBB  . BREAST BIOPSY Left 2000  . BREAST LUMPECTOMY Left 2000  . CARDIAC CATHETERIZATION  ~ 2010   "no  blockage"  . CARPAL TUNNEL RELEASE Bilateral 2007   Dr. Daylene Katayama  . KNEE ARTHROSCOPY Right   . TONSILLECTOMY    . TRIGGER FINGER RELEASE Bilateral 2007  . VAGINAL HYSTERECTOMY     Paritial     Allergies  Allergies  Allergen Reactions  . Contrast Media [Iodinated Diagnostic Agents] Nausea And Vomiting    N&V  . Crestor [Rosuvastatin] Other (See Comments)    Myalgia, weakness  . Atorvastatin Other (See Comments)    Myalgia, weakness  . Lisinopril Other (See Comments)    REACTION: cough  . Naproxen Sodium Other (See Comments)    REACTION: Breathing difficulty  . Penicillins Other (See Comments)    Has patient had a PCN reaction causing immediate rash, facial/tongue/throat swelling, SOB or lightheadedness with hypotension:  Has patient had a PCN reaction causing severe rash involving mucus membranes or skin necrosis:  Has patient had a PCN reaction that required hospitalization  Has patient had a PCN reaction occurring within the last 10 years:  If all of the above answers are "NO", then may proceed with Cephalosporin use.REAC  . Shellfish Allergy Other (See Comments)    Cant breath   . Simvastatin Other (See Comments)    REACTION: muscle aches    History of Present Illness    Mrs. Brenda Marsh is a 71 yo female with PMH of NICM s/p AICD, HTN, HLD, NIDDM, and Breast Ca. She underwent cardiac cath in [place] in 3/10 for noted reduced LV function of 35%. The left main was  normal. The LAD had a proximal 25-30% lesion. The circumflex was normal. The right coronary artery had a 15% lesion. Patient was treated medically.Carotid Dopplers in October 2015 showed a 50-69% right and no left stenosis. Echocardiogram in August 2015 showed an ejection fraction of 25-30% and restrictive filling. Had biventricular ICD October 2015 by Dr. Lovena Le.  Last Echo in 11/16 showed EF of 40% with G1DD. She was last seen by Dr. Stanford Breed on 9/17 and reported doing well. Her medications were continued the same. It was  recommended that she repeat her carotid dopplers, though she declined. Noted to be allergic to ASA and intolerant to statins. Declined the addition of plavix.   Seen by Dr. Lovena Le on 11/17, noted her device was functioning normally and she was continued on home medications.   Report over the past couple of weeks she has increasing episodes of sudden onset of dyspnea and feeling like her throat is tight. Had 2 episodes prior to christmas, another after. Was seen at [facility] on 1/3 for the same after waking up around 2am feeling like she could not breath and a burning sensation in her throat. Dx with GERD and started on Zantac. Went home that morning and took her normal medications. Went to church and developed a sudden onset of dyspnea again, felt like there was something like "powder" in her throat. She stood up and felt like she may pass out. Bystander grabbed her and helped her to the floor, but did not have full syncope.   In the ED her labs showed stable electrolytes, Hgb 12.0, Trop neg x3. EKG showed SR with vpacing. CXR was normal. PPM was interrogated without any arrhythmias noted.   Inpatient Medications    . carvedilol  12.5 mg Oral BID WC  . cholecalciferol  400 Units Oral Daily  . enoxaparin (LOVENOX) injection  40 mg Subcutaneous Q24H  . furosemide  20 mg Oral Daily  . multivitamin with minerals  1 tablet Oral Daily  . pantoprazole  40 mg Oral BID  . sodium chloride flush  3 mL Intravenous Q12H  . sodium chloride flush  3 mL Intravenous Q12H  . spironolactone  12.5 mg Oral Daily    Family History    Family History  Problem Relation Age of Onset  . Heart attack Mother 88  . Diabetes Sister   . Diabetes Brother   . Coronary artery disease Other     Female first degree relative <60  . Hyperlipidemia Other   . Hypertension Other   . Cancer Other     prostate, 1st degree relative <50    Social History    Social History   Social History  . Marital status: Widowed    Spouse  name: N/A  . Number of children: 2  . Years of education: N/A   Occupational History  .  Disabled   Social History Main Topics  . Smoking status: Former Smoker    Years: 2.50    Types: Cigarettes    Quit date: 01/18/1970  . Smokeless tobacco: Never Used     Comment: "smoked maybe 1 pack/month when I did smoke  . Alcohol use No  . Drug use: No  . Sexual activity: Not on file   Other Topics Concern  . Not on file   Social History Narrative   Widow - husband died in May 12, 2003   Occupation - retired from working in Radiation protection practitioner   2 daughter - on lives in [place], other in [place]  Remote history of tobacco but none in 40 years   Alcohol use - no     Review of Systems    General:  No chills, fever, night sweats or weight changes.  Cardiovascular:  See HPI Dermatological: No rash, lesions/masses Respiratory: No cough, dyspnea Urologic: No hematuria, dysuria Abdominal:   No nausea, vomiting, diarrhea, bright red blood per rectum, melena, or hematemesis Neurologic:  No visual changes, wkns, changes in mental status. All other systems reviewed and are otherwise negative except as noted above.  Physical Exam    Blood pressure 126/72, pulse 78, temperature 98.2 F (36.8 C), temperature source Oral, resp. rate 20, height 5\' 5"  (1.651 m), weight 209 lb 3.5 oz (94.9 kg), SpO2 100 %.  General: Pleasant older AAF, NAD Psych: Normal affect. Neuro: Alert and oriented X 3. Moves all extremities spontaneously. HEENT: Normal  Neck: Supple without bruits or JVD. Lungs:  Resp regular and unlabored, CTA. Heart: RRR no s3, s4, or murmurs. Abdomen: Soft, non-tender, non-distended, BS + x 4.  Extremities: No clubbing, cyanosis or edema. DP/PT/Radials 2+ and equal bilaterally.  Labs    Troponin (Point of Care Test) No results for input(s): TROPIPOC in the last 72 hours.  Recent Labs  01/21/16 0227 01/21/16 0514 01/21/16 1353  TROPONINI <0.03 <0.03 <0.03   Lab Results  Component Value  Date   WBC 7.5 01/21/2016   HGB 12.0 01/21/2016   HCT 36.5 01/21/2016   MCV 92.9 01/21/2016   PLT 226 01/21/2016     Recent Labs Lab 01/22/16 0430  NA 138  K 4.1  CL 105  CO2 25  BUN 10  CREATININE 0.79  CALCIUM 9.5  GLUCOSE 92   Lab Results  Component Value Date   CHOL 260 (H) 05/12/2015   HDL 41.80 05/12/2015   LDLCALC 198 (H) 05/12/2015   TRIG 101.0 05/12/2015   No results found for: [facility]   Radiology Studies    Dg Chest 2 View  Result Date: 01/21/2016 CLINICAL DATA:  Choking sensation, could not breathe EXAM: CHEST  2 VIEW COMPARISON:  11/17/2013 FINDINGS: There is a right-sided pacing device with grossly intact leads. There is no acute infiltrate, consolidation, or pleural effusion. Stable borderline cardiomegaly. No overt failure. IMPRESSION: No radiographic evidence for acute cardiopulmonary abnormality. Electronically Signed   By: Donavan Foil M.D.   On: 01/21/2016 03:20    ECG & Cardiac Imaging    EKG: Sr with Vpacing  Echo: 11/16   Study Conclusions  - Left ventricle: The cavity size was normal. Wall thickness was   normal. LVEF is approximately 40% with hypokinesis worse in the   inferior/inferoseptal walls. Systolic function was moderately   reduced. The estimated ejection fraction was in the range of 35%   to 40%. Doppler parameters are consistent with abnormal left   ventricular relaxation (grade 1 diastolic dysfunction).  Assessment & Plan    71 yo female with PMH of NICM s/p AICD, HTN, HLD, NIDDM, Breast Ca who presented to [place] with dyspnea and near syncope.   1. Dyspnea/Near syncope: Reports she has been having episodes of acute dyspnea over the past 2-3 weeks. Episodes seem to be happening more often. In talking with the patient, it was found that she correlated this onset after taking her medications. States she thought it was maybe a reaction to her coreg as it was being supplied by a Risk analyst. In further review,  episode seem to happen after taking her losartan. Reports she has  been on this for many years without issue, but taken off lisinopril in the past due to cough. Was given a dose this morning and reports having a "funny" sensation in her throat. Dc'ed by primary team. CXR was negative for edema, reports her weight has been stable, and no evidence of volume overload on exam.  -- Agreed with holding losartan, check d-dimer though suspension for PE is low. Repeat echo is pending.  2. 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 Interrogated with no findings of arrhythmias. Denies any palpitations, dizziness, or lightheadedness prior to episodes.   -- volume stable on lasix dosing  3. HTN: Generally controlled.  4. HLD: Statin intolerant, consider referral to lipid clinic.   5. NIDDM: Diet controlled. Last Hgb A1c 6.2  Barnet Pall, NP-C Pager [phone] 01/22/2016, 12:56 PM  The patient has been seen in conjunction with Reino Bellis, NP-C. All aspects of care have been considered and discussed. The patient has been personally interviewed, examined, and all clinical data has been reviewed.   Very unusual history of paroxysmal nocturnal dyspnea occurring on 3 occasions prior to an episode of syncope that occurred on the day of admission. Episodes of dyspnea have been associated with a sensation of her heart pounding. No arrhythmias have been identified by AICD interrogation. Other significant clinical problems include nonischemic cardiomyopathy (possibly chemotherapy related), hypertension, non-insulin-dependent diabetes, and hyperlipidemia. Exam is unremarkable.  Cause for the patient's recent PND and syncope are unclear. There seems to be an anxiety component. Agree with echocardiogram, cycling cardiac markers, and repeating an echocardiogram to reassess LV function. We will follow with you. We should probably reinterrogate her device following the syncopal episode.

## 2016-01-23 ENCOUNTER — Inpatient Hospital Stay (HOSPITAL_COMMUNITY): Payer: Medicare HMO

## 2016-01-23 ENCOUNTER — Inpatient Hospital Stay (HOSPITAL_BASED_OUTPATIENT_CLINIC_OR_DEPARTMENT_OTHER): Payer: Medicare HMO

## 2016-01-23 ENCOUNTER — Telehealth: Payer: Self-pay | Admitting: Family Medicine

## 2016-01-23 DIAGNOSIS — I5042 Chronic combined systolic (congestive) and diastolic (congestive) heart failure: Secondary | ICD-10-CM | POA: Diagnosis not present

## 2016-01-23 DIAGNOSIS — R55 Syncope and collapse: Secondary | ICD-10-CM

## 2016-01-23 DIAGNOSIS — R0600 Dyspnea, unspecified: Secondary | ICD-10-CM | POA: Diagnosis not present

## 2016-01-23 LAB — ECHOCARDIOGRAM COMPLETE
Height: 65 in
Weight: 3396.8 oz

## 2016-01-23 MED ORDER — PERFLUTREN LIPID MICROSPHERE
1.0000 mL | INTRAVENOUS | Status: AC | PRN
  Administered 2016-01-23: 3 mL via INTRAVENOUS
  Filled 2016-01-23: qty 10

## 2016-01-23 MED ORDER — PANTOPRAZOLE SODIUM 40 MG PO TBEC: 40.0000 mg | DELAYED_RELEASE_TABLET | Freq: Two times a day (BID) | ORAL | 0 refills | Status: DC

## 2016-01-23 MED ORDER — ALBUTEROL SULFATE HFA 108 (90 BASE) MCG/ACT IN AERS: 2.0000 | INHALATION_SPRAY | Freq: Four times a day (QID) | RESPIRATORY_TRACT | 2 refills | Status: DC | PRN

## 2016-01-23 NOTE — Progress Notes (Signed)
Orders received for pt discharge.  Discharge summary printed and reviewed with pt.  Explained medication regimen, and pt had no further questions at this time.  IV removed and site remains clean, dry, intact.  Telemetry removed.  Pt in stable condition and awaiting transport. 

## 2016-01-23 NOTE — Progress Notes (Signed)
**  Preliminary report by tech**  Bilateral lower extremity venous duplex completed. There is no evidence of deep or superficial vein thrombosis involving the right and left lower extremities. All visualized vessels appear patent and compressible. There is no evidence of Baker's cysts bilaterally.  01/23/16 11:49 AM Brenda Marsh RVT

## 2016-01-23 NOTE — Telephone Encounter (Signed)
Patient is being discharged from [facility] today and discharging MD request a follow up within a week. Scheduled with PA due to Provider having no available Hospital Follow up slots.

## 2016-01-23 NOTE — Care Management CC44 (Signed)
Condition Code 44 Documentation Completed  Patient Details  Name: Brenda Marsh MRN: [id number] Date of Birth: 11-13-1945   Condition Code 44 given:   yes Patient signature on Condition Code 44 notice:   yes Documentation of 2 MD's agreement:   yes Code 44 added to claim:   yes    Royston Bake, RN 01/23/2016, 12:56 PM

## 2016-01-23 NOTE — Progress Notes (Signed)
  Echocardiogram 2D Echocardiogram with Definity has been performed.  Diamond Nickel 01/23/2016, 9:33 AM

## 2016-01-23 NOTE — Discharge Summary (Signed)
Physician Discharge Summary  Brenda Marsh [id number] DOB: 04/19/1945 DOA: 01/21/2016  PCP: [name], MD  Admit date: 01/21/2016 Discharge date: 01/23/2016  Admitted From: Home  Disposition:  Home   Recommendations for Outpatient Follow-up:  1. Follow up with PCP in 1-2 weeks 2. Please obtain BMP/CBC in one week 3. Further evaluation of episodic dyspnea.  4. I have advised patient not to drive for now. Further assessment by PCP   Home Health: no  Discharge Condition: Stable.  CODE STATUS: Full code.  Diet recommendation: Heart Healthy  Brief/Interim Summary: Brenda Marsh a 71 y.o.femalewith medical history significant forhypertension, diet-controlled type 2 diabetes mellitus, and chronic combined systolic/diastolic CHF [facility]. Patient reports for the past several months, she has had for episodes of acute breathlessness with lightheadedness.    Assessment & Plan: 1-Dyspnea;  Troponin negative, Chest x ray negative for HF Unclear etiology. Bronchospasm ? Albuterol PRN prescribe.  Stop Cozaar to see if helps reduce episodes. .  D dimer mildly elevated. VQ scan negative for PE. Doppler negative for DVT.   Cardiology consulted, they dont think this episodes are cardiac related.  ECHO normal EF.  Will try Protonix to treat reflux.   2-Syncope;  D dimer mildly elevated. V-Q scan negative Orthostatic vitals; negative.  Cardiology consulted. No evidence that syncope was cardiac related.  Pacer was interrogated, no arrhythmia documented.   Chronic combined systolic/diastolic CHF  Continue with Lasix, Aldactone,and Coreg Hold losartan for now,.   Discharge Diagnoses:  Principal Problem:   Syncope Active Problems:   Essential hypertension   Diabetes mellitus type 2, diet-controlled (HCC)   Hypokalemia   Chronic combined systolic and diastolic CHF (congestive heart  failure) (HCC)   Benign paroxysmal positional vertigo    Discharge Instructions  Discharge Instructions    Diet - low sodium heart healthy    Complete by:  As directed    Increase activity slowly    Complete by:  As directed      Allergies as of 01/23/2016      Reactions   Contrast Media [iodinated Diagnostic Agents] Nausea And Vomiting   N&V   Crestor [rosuvastatin] Other (See Comments)   Myalgia, weakness   Atorvastatin Other (See Comments)   Myalgia, weakness   Lisinopril Other (See Comments)   REACTION: cough   Naproxen Sodium Other (See Comments)   REACTION: Breathing difficulty   Penicillins Other (See Comments)      Shellfish Allergy Other (See Comments)   Cant breath   Simvastatin Other (See Comments)   REACTION: muscle aches      Medication List    STOP taking these medications   losartan 50 MG tablet Commonly known as:  COZAAR   ondansetron 8 MG disintegrating tablet Commonly known as:  ZOFRAN ODT   ranitidine 150 MG tablet Commonly known as:  ZANTAC     TAKE these medications   albuterol 108 (90 Base) MCG/ACT inhaler Commonly known as:  PROVENTIL HFA;VENTOLIN HFA Inhale 2 puffs into the lungs every 6 (six) hours as needed for wheezing or shortness of breath.   carvedilol 12.5 MG tablet Commonly known as:  COREG Take 1 tablet (12.5 mg total) by mouth 2 (two) times daily with a meal.   furosemide 20 MG tablet Commonly known as:  LASIX TAKE 1 TABLET BY MOUTH DAILY   meclizine 12.5 MG tablet Commonly known as:  ANTIVERT Take 1 tablet (12.5 mg total) by mouth  3 (three) times daily as needed for dizziness.   multivitamin capsule Take 1 capsule by mouth at bedtime.   pantoprazole 40 MG tablet Commonly known as:  PROTONIX Take 1 tablet (40 mg total) by mouth 2 (two) times daily.   spironolactone 25 MG tablet Commonly known as:  ALDACTONE TAKE 1/2 TABLET (12.5 MG TOTAL) BY MOUTH DAILY.   Vitamin D 400 units capsule Take 400 Units by mouth  daily.       Allergies  Allergen Reactions  . Contrast Media [Iodinated Diagnostic Agents] Nausea And Vomiting    N&V  . Crestor [Rosuvastatin] Other (See Comments)    Myalgia, weakness  . Atorvastatin Other (See Comments)    Myalgia, weakness  . Lisinopril Other (See Comments)    REACTION: cough  . Naproxen Sodium Other (See Comments)    REACTION: Breathing difficulty  . Penicillins Other (See Comments)      . Shellfish Allergy Other (See Comments)    Cant breath   . Simvastatin Other (See Comments)    REACTION: muscle aches    Consultations:  Cardiology    Procedures/Studies: Dg Chest 2 View  Result Date: 01/22/2016 CLINICAL DATA:  Shortness of Breath EXAM: CHEST  2 VIEW COMPARISON:  January 21, 2016 FINDINGS: There is no edema or consolidation. Heart size and pulmonary vascularity are normal. No adenopathy. Pacemaker leads are attached to the right atrium, right ventricle, and left ventricle. There is postoperative change over the left breast region. There is degenerative change in the thoracic spine. IMPRESSION: No edema or consolidation.  Stable cardiac silhouette. Electronically Signed   By: Lowella Grip III M.D.   On: 01/22/2016 14:57   Dg Chest 2 View  Result Date: 01/21/2016 CLINICAL DATA:  Choking sensation, could not breathe EXAM: CHEST  2 VIEW COMPARISON:  11/17/2013 FINDINGS: There is a right-sided pacing device with grossly intact leads. There is no acute infiltrate, consolidation, or pleural effusion. Stable borderline cardiomegaly. No overt failure. IMPRESSION: No radiographic evidence for acute cardiopulmonary abnormality. Electronically Signed   By: Donavan Foil M.D.   On: 01/21/2016 03:20   Nm Pulmonary Perf And Vent  Result Date: 01/22/2016 CLINICAL DATA:  Dyspnea, history combined systolic and diastolic CHF, syncope, episodes of breath nervousness and lightheadedness, hypertension, diet-controlled type II diabetes mellitus EXAM: NUCLEAR MEDICINE  VENTILATION - PERFUSION LUNG SCAN TECHNIQUE: Ventilation images were obtained in multiple projections using inhaled aerosol Tc-58m DTPA. Perfusion images were obtained in multiple projections after intravenous injection of Tc-83m MAA. RADIOPHARMACEUTICALS:  31 mCi Technetium-66m DTPA aerosol inhalation and 4.2 mCi Technetium-13m MAA IV COMPARISON:  None Correlation: Chest radiograph 01/21/2014 FINDINGS: Ventilation: Central airway deposition of tracer. Swallowed aerosol within the stomach. Photopenic defect lateral mid RIGHT lung corresponding to generator for AICD. No other ventilation abnormalities. Perfusion: Photopenic defect from AICD generator. Otherwise normal perfusion lung scan. No segmental or subsegmental perfusion defects suggestive of pulmonary embolism are identified. IMPRESSION: Normal ventilation and perfusion lung scan with incidental note of a AICD generator. Electronically Signed   By: Lavonia Dana M.D.   On: 01/22/2016 16:02       Subjective: Feeling well, she has not had any episode in the hospital.   Discharge Exam: Vitals:   01/23/16 0626 01/23/16 1209  BP: 117/62 (!) 115/55  Pulse: 74 68  Resp: 20 16  Temp: 98 F (36.7 C) 98.4 F (36.9 C)   Vitals:   01/22/16 1608 01/22/16 2027 01/23/16 0626 01/23/16 1209  BP: (!) 111/58 (!) 111/57 117/62 Marland Kitchen)  115/55  Pulse: 72 84 74 68  Resp: 20 20 20 16   Temp: 98.2 F (36.8 C) 97.5 F (36.4 C) 98 F (36.7 C) 98.4 F (36.9 C)  TempSrc: Oral Oral Oral Oral  SpO2: 100% 100% 98% 100%  Weight:   96.3 kg (212 lb 4.8 oz)   Height:        General: Pt is alert, awake, not in acute distress Cardiovascular: RRR, S1/S2 +, no rubs, no gallops Respiratory: CTA bilaterally, no wheezing, no rhonchi Abdominal: Soft, NT, ND, bowel sounds + Extremities: no edema, no cyanosis    The results of significant diagnostics from this hospitalization (including imaging, microbiology, ancillary and laboratory) are listed below for reference.      Microbiology: No results found for this or any previous visit (from the past 240 hour(s)).   Labs: BNP (last 3 results) No results for input(s): BNP in the last 8760 hours. Basic Metabolic Panel:  Recent Labs Lab 01/21/16 0227 01/22/16 0430  NA 138 138  K 3.4* 4.1  CL 105 105  CO2 24 25  GLUCOSE 103* 92  BUN 12 10  CREATININE 0.78 0.79  CALCIUM 9.6 9.5  MG  --  2.4   Liver Function Tests: No results for input(s): AST, ALT, ALKPHOS, BILITOT, PROT, ALBUMIN in the last 168 hours. No results for input(s): LIPASE, AMYLASE in the last 168 hours. No results for input(s): AMMONIA in the last 168 hours. CBC:  Recent Labs Lab 01/21/16 0227  WBC 7.5  HGB 12.0  HCT 36.5  MCV 92.9  PLT 226   Cardiac Enzymes:  Recent Labs Lab 01/21/16 0227 01/21/16 0514 01/21/16 1353  TROPONINI <0.03 <0.03 <0.03   BNP: Invalid input(s): POCBNP CBG: No results for input(s): GLUCAP in the last 168 hours. D-Dimer  Recent Labs  01/22/16 1233  DDIMER 0.80*   Hgb A1c No results for input(s): HGBA1C in the last 72 hours. Lipid Profile No results for input(s): CHOL, HDL, LDLCALC, TRIG, CHOLHDL, LDLDIRECT in the last 72 hours. Thyroid function studies No results for input(s): TSH, T4TOTAL, T3FREE, THYROIDAB in the last 72 hours.  Invalid input(s): FREET3 Anemia work up No results for input(s): VITAMINB12, FOLATE, FERRITIN, TIBC, IRON, RETICCTPCT in the last 72 hours. Urinalysis    Component Value Date/Time   COLORURINE YELLOW 11/23/2014 1659   APPEARANCEUR CLOUDY (A) 11/23/2014 1659   LABSPEC 1.016 11/23/2014 1659   PHURINE 6.0 11/23/2014 1659   GLUCOSEU NEGATIVE 11/23/2014 1659   HGBUR NEGATIVE 11/23/2014 1659   HGBUR moderate 04/29/2009 1025   BILIRUBINUR NEGATIVE 11/23/2014 1659   KETONESUR NEGATIVE 11/23/2014 1659   PROTEINUR NEGATIVE 11/23/2014 1659   UROBILINOGEN 0.2 11/23/2014 1659   NITRITE NEGATIVE 11/23/2014 1659   LEUKOCYTESUR SMALL (A) 11/23/2014 1659    Sepsis Labs Invalid input(s): PROCALCITONIN,  WBC,  LACTICIDVEN Microbiology No results found for this or any previous visit (from the past 240 hour(s)).     SIGNED:   Elmarie Shiley, MD  [facility] 01/23/2016, 1:40 PM Pager   If 7PM-7AM, please contact night-coverage [email or web address] Password [email or web address]

## 2016-01-23 NOTE — Progress Notes (Signed)
Patient Name: Brenda Marsh Date of Encounter: 01/23/2016  Primary Cardiologist: Dr. Lubertha South. [facility] Problem List     Principal Problem:   Syncope Active Problems:   Essential hypertension   Diabetes mellitus type 2, diet-controlled (HCC)   Hypokalemia   Chronic combined systolic and diastolic CHF (congestive heart failure) (HCC)   Benign paroxysmal positional vertigo   Subjective   No further episodes of dyspnea, no chest pain.   Inpatient Medications    Scheduled Meds: . carvedilol  12.5 mg Oral BID WC  . cholecalciferol  400 Units Oral Daily  . enoxaparin (LOVENOX) injection  40 mg Subcutaneous Q24H  . furosemide  20 mg Oral Daily  . multivitamin with minerals  1 tablet Oral Daily  . pantoprazole  40 mg Oral BID  . sodium chloride flush  3 mL Intravenous Q12H  . sodium chloride flush  3 mL Intravenous Q12H  . spironolactone  12.5 mg Oral Daily   Continuous Infusions:  PRN Meds: sodium chloride, acetaminophen **OR** acetaminophen, bisacodyl, HYDROcodone-acetaminophen, meclizine, ondansetron **OR** ondansetron (ZOFRAN) IV, perflutren lipid microspheres (DEFINITY) IV suspension, polyethylene glycol, sodium chloride flush, technetium TC 9M diethylenetriame-pentaacetic acid   Vital Signs    Vitals:   01/22/16 0950 01/22/16 1608 01/22/16 2027 01/23/16 0626  BP: 126/72 (!) 111/58 (!) 111/57 117/62  Pulse: 78 72 84 74  Resp: 20 20 20 20   Temp: 98.2 F (36.8 C) 98.2 F (36.8 C) 97.5 F (36.4 C) 98 F (36.7 C)  TempSrc: Oral Oral Oral Oral  SpO2: 100% 100% 100% 98%  Weight:    212 lb 4.8 oz (96.3 kg)  Height:        Intake/Output Summary (Last 24 hours) at 01/23/16 0939 Last data filed at 01/23/16 0911  Gross per 24 hour  Intake              755 ml  Output              360 ml  Net              395 ml   Filed Weights   01/21/16 1832 01/22/16 0455 01/23/16 0626  Weight: 210 lb 5.1 oz (95.4 kg) 209 lb 3.5 oz (94.9 kg) 212 lb 4.8 oz (96.3  kg)    Physical Exam   GEN: Well nourished, well developed, older AAF in no acute distress.  HEENT: Grossly normal.  Neck: Supple, no JVD, carotid bruits, or masses. Cardiac: RRR, no murmurs, rubs, or gallops. No clubbing, cyanosis, edema.  Radials/DP/PT 2+ and equal bilaterally.  Respiratory:  Respirations regular and unlabored, clear to auscultation bilaterally. GI: Soft, nontender, nondistended, BS + x 4. MS: no deformity or atrophy. Skin: warm and dry, no rash. Neuro:  Strength and sensation are intact. Psych: AAOx3.  Normal affect.  Labs    CBC  Recent Labs  01/21/16 0227  WBC 7.5  HGB 12.0  HCT 36.5  MCV 92.9  PLT [id number]   Basic Metabolic Panel  Recent Labs  01/21/16 0227 01/22/16 0430  NA 138 138  K 3.4* 4.1  CL 105 105  CO2 24 25  GLUCOSE 103* 92  BUN 12 10  CREATININE 0.78 0.79  CALCIUM 9.6 9.5  MG  --  2.4   Liver Function Tests No results for input(s): AST, ALT, ALKPHOS, BILITOT, PROT, ALBUMIN in the last 72 hours. No results for input(s): LIPASE, AMYLASE in the last 72 hours. Cardiac Enzymes  Recent Labs  01/21/16 0227  01/21/16 0514 01/21/16 1353  TROPONINI <0.03 <0.03 <0.03   BNP Invalid input(s): POCBNP D-Dimer  Recent Labs  01/22/16 1233  DDIMER 0.80*   Hemoglobin A1C No results for input(s): HGBA1C in the last 72 hours. Fasting Lipid Panel No results for input(s): CHOL, HDL, LDLCALC, TRIG, CHOLHDL, LDLDIRECT in the last 72 hours. Thyroid Function Tests No results for input(s): TSH, T4TOTAL, T3FREE, THYROIDAB in the last 72 hours.  Invalid input(s): FREET3  Telemetry    SR, vpacing - Personally Reviewed  ECG    N/A - Personally Reviewed  Radiology    Dg Chest 2 View  Result Date: 01/22/2016 CLINICAL DATA:  Shortness of Breath EXAM: CHEST  2 VIEW COMPARISON:  January 21, 2016 FINDINGS: There is no edema or consolidation. Heart size and pulmonary vascularity are normal. No adenopathy. Pacemaker leads are attached to the  right atrium, right ventricle, and left ventricle. There is postoperative change over the left breast region. There is degenerative change in the thoracic spine. IMPRESSION: No edema or consolidation.  Stable cardiac silhouette. Electronically Signed   By: Lowella Grip III M.D.   On: 01/22/2016 14:57   Nm Pulmonary Perf And Vent  Result Date: 01/22/2016 CLINICAL DATA:  Dyspnea, history combined systolic and diastolic CHF, syncope, episodes of breath nervousness and lightheadedness, hypertension, diet-controlled type II diabetes mellitus EXAM: NUCLEAR MEDICINE VENTILATION - PERFUSION LUNG SCAN TECHNIQUE: Ventilation images were obtained in multiple projections using inhaled aerosol Tc-37m DTPA. Perfusion images were obtained in multiple projections after intravenous injection of Tc-39m MAA. RADIOPHARMACEUTICALS:  31 mCi Technetium-14m DTPA aerosol inhalation and 4.2 mCi Technetium-65m MAA IV COMPARISON:  None Correlation: Chest radiograph 01/21/2014 FINDINGS: Ventilation: Central airway deposition of tracer. Swallowed aerosol within the stomach. Photopenic defect lateral mid RIGHT lung corresponding to generator for AICD. No other ventilation abnormalities. Perfusion: Photopenic defect from AICD generator. Otherwise normal perfusion lung scan. No segmental or subsegmental perfusion defects suggestive of pulmonary embolism are identified. IMPRESSION: Normal ventilation and perfusion lung scan with incidental note of a AICD generator. Electronically Signed   By: Lavonia Dana M.D.   On: 01/22/2016 16:02    Cardiac Studies   TTE1/05/2016: Study Conclusions  - Left ventricle: The cavity size was mildly dilated. Wall   thickness was normal. Systolic function was normal. The estimated   ejection fraction was in the range of 50% to 55%. Left   ventricular diastolic function parameters were normal. - Atrial septum: No defect or patent foramen ovale was identified.  Patient Profile     71 yo female with  PMH of NICM s/p AICD, HTN, HLD, NIDDM, Breast Ca who presented to [place] with dyspnea and near syncope.   Assessment & Plan    1. Dyspnea/Near syncope: Reports she has been having episodes of acute dyspnea over the past 2-3 weeks. Episodes seem to be happening more often. In talking with the patient, it was found that she correlated this onset after taking her medications. States she thought it was maybe a reaction to her coreg as it was being supplied by a Risk analyst. In further review, episode seem to happen after taking her losartan. Reports she has been on this for many years without issue, but taken off lisinopril in the past due to cough. Was given a dose yesterday morning and reports having a "funny" sensation in her throat. Dc'ed by primary team. CXR was negative for edema, reports her weight has been stable, and no evidence of volume overload on exam. ?Anxiety may  been playing a role. -- Ddimer yesterday was positive, but VQ scan without PE.  -- Echo reveals improved LV function compared to 14 months ago with EF improving above 50%.  2. 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 Interrogated with no findings of arrhythmias. Denies any palpitations, dizziness, or lightheadedness prior to episodes.   -- volume stable on lasix dosing  3. HTN: Generally controlled.  4. HLD: Statin intolerant, consider referral to lipid clinic.   5. NIDDM: Diet controlled. Last Hgb A1c 6.2  Signed, Reino Bellis, NP  01/23/2016, 9:39 AM    The patient has been seen in conjunction with Reino Bellis, NP-C. All aspects of care have been considered and discussed. The patient has been personally interviewed, examined, and all clinical data has been reviewed.   Overall, workup to this point is negative. No evidence of MI.  LV function is improved based on echo report.  Has not had one of these "spells" of dyspnea here in the hospital. I am not convinced that these  episodes are related to her medications. Seems very anxious.

## 2016-01-24 NOTE — Telephone Encounter (Signed)
See if we can get her in anywhere towards end of the week

## 2016-01-26 ENCOUNTER — Telehealth: Payer: Self-pay | Admitting: Behavioral Health

## 2016-01-26 NOTE — Telephone Encounter (Signed)
Transition Care Management Follow-up Telephone Call  PCP: [name], MD  Admit date: 01/21/2016 Discharge date: 01/23/2016  Admitted From: Home  Disposition:  Home   Recommendations for Outpatient Follow-up:  1. Follow up with PCP in 1-2 weeks 2. Please obtain BMP/CBC in one week 3. Further evaluation of episodic dyspnea.  4. I have advised patient not to drive for now. Further assessment by PCP   Home Health: no  Discharge Condition: Stable.    How have you been since you were released from the hospital? Patient stated, "I feel great".   Do you understand why you were in the hospital? yes   Do you understand the discharge instructions? yes   Where were you discharged to? Home   Items Reviewed:  Medications reviewed: yes  Allergies reviewed: yes  Dietary changes reviewed: yes, heart healthy diet  Referrals reviewed: yes, Follow up with PCP in 1-2 weeks   Functional Questionnaire:   Activities of Daily Living (ADLs):   She states they are independent in the following: ambulation, bathing and hygiene, feeding, continence, grooming, toileting and dressing States they require assistance with the following: None   Any transportation issues/concerns?: no   Any patient concerns? no   Confirmed importance and date/time of follow-up visits scheduled yes, 01/20/16 at 11:30 AM.  Provider Appointment booked with Mackie Pai, PA-C.  Confirmed with patient if condition begins to worsen call PCP or go to the ER.  Patient was given the office number and encouraged to call back with question or concerns.  : yes

## 2016-01-30 ENCOUNTER — Ambulatory Visit (INDEPENDENT_AMBULATORY_CARE_PROVIDER_SITE_OTHER): Payer: Medicare HMO | Admitting: Medical

## 2016-01-30 VITALS — BP 118/76 | HR 72 | Temp 98.0°F | Resp 16 | Ht 65.0 in | Wt 216.1 lb

## 2016-01-30 DIAGNOSIS — I509 Heart failure, unspecified: Secondary | ICD-10-CM

## 2016-01-30 DIAGNOSIS — J45909 Unspecified asthma, uncomplicated: Secondary | ICD-10-CM

## 2016-01-30 DIAGNOSIS — T7840XD Allergy, unspecified, subsequent encounter: Secondary | ICD-10-CM | POA: Diagnosis not present

## 2016-01-30 DIAGNOSIS — R55 Syncope and collapse: Secondary | ICD-10-CM | POA: Diagnosis not present

## 2016-01-30 DIAGNOSIS — K219 Gastro-esophageal reflux disease without esophagitis: Secondary | ICD-10-CM

## 2016-01-30 DIAGNOSIS — R0683 Snoring: Secondary | ICD-10-CM

## 2016-01-30 LAB — CBC WITH DIFFERENTIAL/PLATELET
BASOS PCT: 1 %
Basophils Absolute: 59 cells/uL (ref 0–200)
Eosinophils Absolute: 472 cells/uL (ref 15–500)
Eosinophils Relative: 8 %
HEMATOCRIT: 37.3 % (ref 35.0–45.0)
HEMOGLOBIN: 12.3 g/dL (ref 11.7–15.5)
LYMPHS PCT: 45 %
Lymphs Abs: 2655 cells/uL (ref 850–3900)
MCH: 30.8 pg (ref 27.0–33.0)
MCHC: 33 g/dL (ref 32.0–36.0)
MCV: 93.5 fL (ref 80.0–100.0)
MONO ABS: 295 {cells}/uL (ref 200–950)
MPV: 10.3 fL (ref 7.5–12.5)
Monocytes Relative: 5 %
NEUTROS ABS: 2419 {cells}/uL (ref 1500–7800)
Neutrophils Relative %: 41 %
Platelets: 226 10*3/uL (ref 140–400)
RBC: 3.99 MIL/uL (ref 3.80–5.10)
RDW: 14.3 % (ref 11.0–15.0)
WBC: 5.9 10*3/uL (ref 3.8–10.8)

## 2016-01-30 LAB — COMPREHENSIVE METABOLIC PANEL
ALK PHOS: 70 U/L (ref 33–130)
ALT: 17 U/L (ref 6–29)
AST: 26 U/L (ref 10–35)
Albumin: 3.9 g/dL (ref 3.6–5.1)
BILIRUBIN TOTAL: 0.4 mg/dL (ref 0.2–1.2)
BUN: 12 mg/dL (ref 7–25)
CALCIUM: 9.6 mg/dL (ref 8.6–10.4)
CO2: 23 mmol/L (ref 20–31)
Chloride: 107 mmol/L (ref 98–110)
Creat: 0.87 mg/dL (ref 0.60–0.93)
GLUCOSE: 104 mg/dL — AB (ref 65–99)
Potassium: 4.3 mmol/L (ref 3.5–5.3)
Sodium: 138 mmol/L (ref 135–146)
TOTAL PROTEIN: 7.8 g/dL (ref 6.1–8.1)

## 2016-01-30 NOTE — Patient Instructions (Addendum)
For follow up from recent hospitalization will get cbc, cmp and bnp.  No definite cause was found on extensive work up in hospital.  I do agree to stop losartan and will also refer you to allergist.  I also think GI referral good idea to see if you have reflux high up in your esophagus. Sometimes this can precipitate respiratory issues.  With your snoring at night and some night time episodes of feeling unable to breath will refer to pulmonologist as well.  I do think since you live alone and have 5 episodes in one month that life alert would be a good idea.  If you do have any near syncope, chest pain or sob episodes then ED evaluation.  Follow up in 7-10 days or as needed   Regarding referral can call here and speak with Anderson Malta or Meredith Mody if by one week no calls regarding referrals.  Continue the protonix and albuterol as well(if needed)

## 2016-01-30 NOTE — Progress Notes (Signed)
Pre visit review using our clinic review tool, if applicable. No additional management support is needed unless otherwise documented below in the visit note/SLS  

## 2016-01-30 NOTE — Progress Notes (Signed)
Subjective:    Patient ID: Brenda Marsh, female    DOB: [birth year], 71 y.o.   MRN: [id number]  HPI  Pt in states she woke up from sleep at AM this morning with a feeling like she couldn't breathe on 1-3 2018. She felt like there were "peppers in here". She describes a feeling of hot peppers in her chest but states it was not a burning feeling. It was not painful. States that she became very anxious. This is the fourth time this is happened. This has been very infrequent over the last few months  Pt was treated in ED on 01-21-2016. She was discharged on zantac. The later that day at church she states felt sob, and throat felt like had pepper in her throat. She states almost passed or did pass out. Pt then went back to ED. They admitted her second time.   Pt work up of heart and lung. Was negative. Had negative troponin, neg cxr, and  Negative v-q scan.  Pt was discharge on protonix to treat potenital reflux. Pt also given albuterol. She has used couple of times. Pt had losartan DC'd.  Cardiologst evaluated pt and does not think cardiac cause per dc summary. Pt has pacemaker and defibrillator. Pt will see cardiologist in February.  Echo done and looked good. Ef 50-55%. [place] lower ext neg.  Pt does snore on review.  Overall 5 episodes(in one month) felt peppers in throat and swelling, sob, and near syncope. Last 2 times were severe.   In light of pt severe near syncope/syncope episodes she states she will get life alert.    Review of Systems  Constitutional: Negative for chills and fatigue.  HENT: Negative for congestion, sinus pain and sinus pressure.   Respiratory: Negative for cough, chest tightness, shortness of breath and wheezing.   Cardiovascular: Negative for chest pain and palpitations.  Gastrointestinal: Negative for abdominal pain, blood in stool, diarrhea, nausea and vomiting.  Musculoskeletal: Negative for back pain.  Neurological: Negative for dizziness, seizures,  syncope, weakness and light-headedness.  Hematological: Negative for adenopathy. Does not bruise/bleed easily.  Psychiatric/Behavioral: Negative for behavioral problems and confusion.                 Objective:   Physical Exam  General Mental Status- Alert. General Appearance- Not in acute distress.   Skin General: Color- Normal Color. Moisture- Normal Moisture.  heent- normal. Tongue not swollen. Lips not swollen. No tonsil hypertrophy.  Neck Carotid Arteries- Normal color. Moisture- Normal Moisture. No carotid bruits. No JVD.  Chest and Lung Exam Auscultation: Breath Sounds:-Normal.  Cardiovascular Auscultation:Rythm- Regular. Murmurs & Other Heart Sounds:Auscultation of the heart reveals- No Murmurs.  Abdomen Inspection:-Inspeection Normal. Palpation/Percussion:Note:No mass. Palpation and Percussion of the abdomen reveal- Non Tender, Non Distended + BS, no rebound or guarding.   Neurologic Cranial Nerve exam:- CN III-XII intact(No nystagmus), symmetric smile. Strength:- 5/5 equal and symmetric strength both upper and lower extremities.  Lower ext- no pedal edema. Neg homans.      Assessment & Plan:  For follow up from recent hospitalization will get cbc, cmp and bnp.  No definite cause was found on extensive work up in hospital.  I do agree to stop losartan and will also refer you to allergist.  I also think GI referral good idea to see if you have reflux high up in your esophagus. Sometimes this can precipitate respiratory issues.  With your snoring at night and some night time episodes of feeling unable  to breath will refer to pulmonologist as well.  I do think since you live alone and have 5 episodes in one month that life alert would be a good idea.  If you do have any near syncope, chest pain or sob episodes then ED evaluation.  Follow up in 7-10 days or as needed   Regarding referral can call here and speak with Anderson Malta or Meredith Mody if by one  week no calls regarding referrals.  Continue the protonix and albuterol as well(if needed)  [name], Percell Miller, PA-C

## 2016-01-30 NOTE — Addendum Note (Signed)
Addended by: Peggyann Shoals on: 01/30/2016 02:46 PM   Modules accepted: Orders

## 2016-01-31 LAB — BRAIN NATRIURETIC PEPTIDE: Brain Natriuretic Peptide: 53.5 pg/mL (ref <100)

## 2016-02-02 ENCOUNTER — Telehealth: Payer: Self-pay | Admitting: Family Medicine

## 2016-02-02 NOTE — Telephone Encounter (Signed)
Patient scheduled medicare wellness appointment for 05/17/16 with Hoyle Sauer at 1:30pm and physical with Dr. Charlett Blake at 2:30pm, patient requesting pre visit labs, please advise

## 2016-02-03 ENCOUNTER — Encounter: Payer: Self-pay | Admitting: Gastroenterology

## 2016-02-03 ENCOUNTER — Other Ambulatory Visit: Payer: Self-pay | Admitting: Family Medicine

## 2016-02-03 DIAGNOSIS — E785 Hyperlipidemia, unspecified: Secondary | ICD-10-CM

## 2016-02-03 DIAGNOSIS — E119 Type 2 diabetes mellitus without complications: Secondary | ICD-10-CM

## 2016-02-03 DIAGNOSIS — I10 Essential (primary) hypertension: Secondary | ICD-10-CM

## 2016-02-03 NOTE — Telephone Encounter (Signed)
Patient scheduled for 05/10/16 for pre visit labs.

## 2016-02-03 NOTE — Telephone Encounter (Signed)
Please advise pre-visit labs.

## 2016-02-03 NOTE — Telephone Encounter (Signed)
Labs entered.

## 2016-02-03 NOTE — Telephone Encounter (Signed)
Please schedule lab appointment approx 1 week prior to CPE.

## 2016-02-03 NOTE — Telephone Encounter (Signed)
Cmp, cbc, tsh, lipid, microalb, hgba1c for HTN, hyperlipidemia and DM 2 diet controlled, please order

## 2016-02-10 ENCOUNTER — Emergency Department (HOSPITAL_BASED_OUTPATIENT_CLINIC_OR_DEPARTMENT_OTHER): Payer: Medicare HMO

## 2016-02-10 ENCOUNTER — Telehealth: Payer: Self-pay | Admitting: Cardiology

## 2016-02-10 ENCOUNTER — Emergency Department (HOSPITAL_BASED_OUTPATIENT_CLINIC_OR_DEPARTMENT_OTHER)
Admission: EM | Admit: 2016-02-10 | Discharge: 2016-02-10 | Disposition: A | Discharge status: Home / Self Care | Payer: Medicare HMO | Attending: Emergency Medicine | Admitting: Emergency Medicine

## 2016-02-10 ENCOUNTER — Encounter (HOSPITAL_BASED_OUTPATIENT_CLINIC_OR_DEPARTMENT_OTHER): Payer: Self-pay

## 2016-02-10 DIAGNOSIS — R05 Cough: Secondary | ICD-10-CM

## 2016-02-10 DIAGNOSIS — Z853 Personal history of malignant neoplasm of breast: Secondary | ICD-10-CM | POA: Insufficient documentation

## 2016-02-10 DIAGNOSIS — Z79899 Other long term (current) drug therapy: Secondary | ICD-10-CM | POA: Diagnosis not present

## 2016-02-10 DIAGNOSIS — J45901 Unspecified asthma with (acute) exacerbation: Secondary | ICD-10-CM | POA: Diagnosis not present

## 2016-02-10 DIAGNOSIS — I5042 Chronic combined systolic (congestive) and diastolic (congestive) heart failure: Secondary | ICD-10-CM | POA: Insufficient documentation

## 2016-02-10 DIAGNOSIS — Z87891 Personal history of nicotine dependence: Secondary | ICD-10-CM | POA: Diagnosis not present

## 2016-02-10 DIAGNOSIS — E119 Type 2 diabetes mellitus without complications: Secondary | ICD-10-CM | POA: Diagnosis not present

## 2016-02-10 DIAGNOSIS — R0602 Shortness of breath: Secondary | ICD-10-CM | POA: Diagnosis not present

## 2016-02-10 DIAGNOSIS — J45909 Unspecified asthma, uncomplicated: Secondary | ICD-10-CM | POA: Diagnosis not present

## 2016-02-10 DIAGNOSIS — I110 Hypertensive heart disease with heart failure: Secondary | ICD-10-CM | POA: Insufficient documentation

## 2016-02-10 DIAGNOSIS — R059 Cough, unspecified: Secondary | ICD-10-CM

## 2016-02-10 LAB — CBC WITH DIFFERENTIAL/PLATELET
BASOS PCT: 0 %
Basophils Absolute: 0 10*3/uL (ref 0.0–0.1)
Eosinophils Absolute: 0.4 10*3/uL (ref 0.0–0.7)
Eosinophils Relative: 5 %
HCT: 35.6 % — ABNORMAL LOW (ref 36.0–46.0)
Hemoglobin: 12.1 g/dL (ref 12.0–15.0)
Lymphocytes Relative: 34 %
Lymphs Abs: 2.9 10*3/uL (ref 0.7–4.0)
MCH: 31.2 pg (ref 26.0–34.0)
MCHC: 34 g/dL (ref 30.0–36.0)
MCV: 91.8 fL (ref 78.0–100.0)
MONO ABS: 0.6 10*3/uL (ref 0.1–1.0)
MONOS PCT: 8 %
NEUTROS ABS: 4.4 10*3/uL (ref 1.7–7.7)
Neutrophils Relative %: 53 %
Platelets: 220 10*3/uL (ref 150–400)
RBC: 3.88 MIL/uL (ref 3.87–5.11)
RDW: 13.7 % (ref 11.5–15.5)
WBC: 8.4 10*3/uL (ref 4.0–10.5)

## 2016-02-10 LAB — BASIC METABOLIC PANEL
Anion gap: 10 (ref 5–15)
BUN: 12 mg/dL (ref 6–20)
CALCIUM: 9.5 mg/dL (ref 8.9–10.3)
CO2: 23 mmol/L (ref 22–32)
CREATININE: 0.85 mg/dL (ref 0.44–1.00)
Chloride: 106 mmol/L (ref 101–111)
GFR calc non Af Amer: >60 mL/min (ref >60)
GLUCOSE: 79 mg/dL (ref 65–99)
Potassium: 3.7 mmol/L (ref 3.5–5.1)
Sodium: 139 mmol/L (ref 135–145)

## 2016-02-10 LAB — TROPONIN I: Troponin I: <0.03 ng/mL (ref <0.03)

## 2016-02-10 MED ORDER — BENZONATATE 100 MG PO CAPS: 100.0000 mg | ORAL_CAPSULE | Freq: Three times a day (TID) | ORAL | 0 refills | Status: DC | PRN

## 2016-02-10 MED ORDER — METHYLPREDNISOLONE SODIUM SUCC 125 MG IJ SOLR
125.0000 mg | Freq: Once | INTRAMUSCULAR | Status: AC
  Administered 2016-02-10: 125 mg via INTRAVENOUS
  Filled 2016-02-10: qty 2

## 2016-02-10 MED ORDER — IPRATROPIUM-ALBUTEROL 0.5-2.5 (3) MG/3ML IN SOLN
3.0000 mL | Freq: Four times a day (QID) | RESPIRATORY_TRACT | Status: DC
  Administered 2016-02-10: 3 mL via RESPIRATORY_TRACT
  Filled 2016-02-10: qty 3

## 2016-02-10 MED ORDER — IPRATROPIUM-ALBUTEROL 0.5-2.5 (3) MG/3ML IN SOLN
3.0000 mL | Freq: Once | RESPIRATORY_TRACT | Status: AC
  Administered 2016-02-10: 3 mL via RESPIRATORY_TRACT
  Filled 2016-02-10: qty 3

## 2016-02-10 MED ORDER — BENZONATATE 100 MG PO CAPS
100.0000 mg | ORAL_CAPSULE | Freq: Once | ORAL | Status: AC
  Administered 2016-02-10: 100 mg via ORAL
  Filled 2016-02-10: qty 1

## 2016-02-10 MED ORDER — PREDNISONE 50 MG PO TABS: 50.0000 mg | ORAL_TABLET | Freq: Every day | ORAL | 0 refills | Status: AC

## 2016-02-10 MED ORDER — ALBUTEROL SULFATE (2.5 MG/3ML) 0.083% IN NEBU
2.5000 mg | INHALATION_SOLUTION | Freq: Once | RESPIRATORY_TRACT | Status: AC
  Administered 2016-02-10: 2.5 mg via RESPIRATORY_TRACT
  Filled 2016-02-10: qty 3

## 2016-02-10 NOTE — Telephone Encounter (Signed)
Advised patient, agreeable with plan

## 2016-02-10 NOTE — Telephone Encounter (Signed)
PAOV Brian Crenshaw  

## 2016-02-10 NOTE — ED Triage Notes (Signed)
Pt with prod cough, SOB x today-was admn for same approx 2 weeks ago

## 2016-02-10 NOTE — Telephone Encounter (Signed)
If pt feels she can wait until tomorrow; ok; otherwise will need to be seen in ER. Kirk Ruths

## 2016-02-10 NOTE — ED Provider Notes (Signed)
[facility]PT [facility] Provider Note   CSN: [id number] Arrival date & time: 02/10/16  1523     History   Chief Complaint Chief Complaint  Patient presents with  . Cough    HPI Brenda Marsh is a 71 y.o. female with a past medical history significant for hypertension, hyperlipidemia, CHF with ICD, asthma, diabetes, and history of breast cancer who presents with wheezing, shortness of breath, and cough. Patient is committed by family including granddaughter who is an emergency department nurse in the [employer]. According to patient, several weeks ago, patient was admitted for respiratory distress and coughing fits. At that time, patient reports that it was felt it was secondary to losartan use. This was discontinued. Patient said that she had been doing well since discharge however, this morning, she woke up at 1 AM with severe coughing, shortness breath, and wheezing. According to the daughter, patient was tripoding and was severely dyspneic. Reportedly, her shortness of breath is worsened with deep breathing and any type of ambulation. According to the daughter, last time the patient had this, she syncopized prompting admission. Patient has not syncopized today.  Patient denies any fevers but does report some continued chills. She denies any nasal congestion or rhinorrhea. She denies nausea, vomiting, conservation, diarrhea, or dysuria. She denies any urinary symptoms. She reports a dry cough with no production. She says that she does start gagging when she is coughing. Patient says that with her wheezing, she gets the point where she can't breathe. She says that she is having some right shoulder pain that goes into her arm. This is worsened with her coughing fits. She says that she has been not responded to her inhaler at home for her wheezing. After calling her pulmonologist and cardiologist, whom she was going to see this week, they directed her to the ED due to respiratory  distress.    HPI  Past Medical History:  Diagnosis Date  . AICD (automatic cardioverter/defibrillator) present   . Anemia 07/16/2012  . Arthritis    left knee pain, MRI in 2008-tricompartmental  degenerate changes, mucoid degeneration of ACL, post horn meniscal tear and ant horn meniscal tear  . Breast cancer in female [facility]) 2000   left; S/P lumpectomy, radiation and chemotherapy  . CHF (congestive heart failure) ([place])   . Colon polyp    unclear pathology  . Diabetes mellitus type 2, diet-controlled ([place]) 03/11/2009   Qualifier: Diagnosis of  By: Wynona Luna  (boderline )  . Hyperlipidemia   . Hypertension   . LBBB (left bundle branch block)   . Low back pain    left L3-4 injected  . Lymphedema of left arm   . Nonischemic cardiomyopathy ([place])   . Presence of permanent cardiac pacemaker   . Travel sickness 05/12/2015  . Unspecified constipation 04/22/2012    Patient Active Problem List   Diagnosis Date Noted  . Syncope 01/21/2016  . Travel sickness 05/12/2015  . Benign paroxysmal positional vertigo 12/01/2014  . Nonischemic cardiomyopathy ([place])   . ICD (implantable cardioverter-defibrillator), biventricular, in situ 02/26/2014  . Chronic combined systolic and diastolic CHF (congestive heart failure) ([place]) 11/16/2013  . Bilateral carotid artery disease ([place])   . Anemia 07/16/2012  . Hypokalemia 06/26/2012  . GERD 07/02/2009  . Hyperlipidemia, mixed 03/11/2009  . Essential hypertension 03/11/2009  . Osteoarthritis 03/11/2009  . Diabetes mellitus type 2, diet-controlled ([place]) 03/11/2009  . History of breast cancer 03/11/2009    Past Surgical History:  Procedure  Laterality Date  . APPENDECTOMY  2009   Dr. Lucia Gaskins  . BI-VENTRICULAR IMPLANTABLE CARDIOVERTER DEFIBRILLATOR N/A 11/16/2013   Procedure: BI-VENTRICULAR IMPLANTABLE CARDIOVERTER DEFIBRILLATOR  (CRT-D);  Surgeon: Evans Lance, MD;  Location: [facility] CATH LAB;  Service: Cardiovascular;  Laterality: N/A;  .  BI-VENTRICULAR IMPLANTABLE CARDIOVERTER DEFIBRILLATOR  (CRT-D)  11/16/13   MDT CRTD implanted by Dr Lovena Le for NICM, CHF, and LBBB  . BREAST BIOPSY Left 2000  . BREAST LUMPECTOMY Left 2000  . CARDIAC CATHETERIZATION  ~ 2010   "no blockage"  . CARPAL TUNNEL RELEASE Bilateral 2007   Dr. Daylene Katayama  . KNEE ARTHROSCOPY Right   . TONSILLECTOMY    . TRIGGER FINGER RELEASE Bilateral 2007  . VAGINAL HYSTERECTOMY     Paritial    OB History    No data available       Home Medications    Prior to Admission medications   Medication Sig Start Date End Date Taking? Authorizing Provider  albuterol (PROVENTIL HFA;VENTOLIN HFA) 108 (90 Base) MCG/ACT inhaler Inhale 2 puffs into the lungs every 6 (six) hours as needed for wheezing or shortness of breath. 01/23/16   Belkys A Regalado, MD  carvedilol (COREG) 12.5 MG tablet Take 1 tablet (12.5 mg total) by mouth 2 (two) times daily with a meal. 06/30/15   Lelon Perla, MD  Cholecalciferol (VITAMIN D) 400 UNITS capsule Take 400 Units by mouth daily.    Historical Provider, MD  furosemide (LASIX) 20 MG tablet TAKE 1 TABLET BY MOUTH DAILY 11/06/15   Lelon Perla, MD  meclizine (ANTIVERT) 12.5 MG tablet Take 1 tablet (12.5 mg total) by mouth 3 (three) times daily as needed for dizziness. 08/08/15   [name], MD  Multiple Vitamin (MULTIVITAMIN) capsule Take 1 capsule by mouth at bedtime.     Historical Provider, MD  pantoprazole (PROTONIX) 40 MG tablet Take 1 tablet (40 mg total) by mouth 2 (two) times daily. 01/23/16   Belkys A Regalado, MD  spironolactone (ALDACTONE) 25 MG tablet TAKE 1/2 TABLET (12.5 MG TOTAL) BY MOUTH DAILY. 10/14/15   Lelon Perla, MD    Family History Family History  Problem Relation Age of Onset  . Heart attack Mother 48  . Diabetes Sister   . Diabetes Brother   . Coronary artery disease Other     Female first degree relative <60  . Hyperlipidemia Other   . Hypertension Other   . Cancer Other     prostate, 1st  degree relative <50    Social History Social History  Substance Use Topics  . Smoking status: Former Smoker    Years: 2.50    Types: Cigarettes    Quit date: 01/18/1970  . Smokeless tobacco: Never Used     Comment: "smoked maybe 1 pack/month when I did smoke  . Alcohol use No     Allergies   Contrast media [iodinated diagnostic agents]; Crestor [rosuvastatin]; Atorvastatin; Lisinopril; Naproxen sodium; Penicillins; Shellfish allergy; and Simvastatin   Review of Systems Review of Systems  Constitutional: Negative for activity change, chills, diaphoresis, fatigue and fever.  HENT: Negative for congestion and rhinorrhea.   Eyes: Negative for visual disturbance.  Respiratory: Positive for cough, choking, shortness of breath and wheezing. Negative for chest tightness and stridor.   Cardiovascular: Negative for chest pain, palpitations and leg swelling.  Gastrointestinal: Negative for abdominal distention, abdominal pain, constipation, diarrhea, nausea and vomiting.  Genitourinary: Negative for difficulty urinating, dysuria, flank pain, frequency, hematuria, menstrual problem, pelvic pain,  vaginal bleeding and vaginal discharge.  Musculoskeletal: Negative for back pain and neck pain.  Skin: Negative for rash and wound.  Neurological: Negative for dizziness, weakness, light-headedness, numbness and headaches.  Psychiatric/Behavioral: Negative for agitation and confusion.  All other systems reviewed and are negative.    Physical Exam Updated Vital Signs BP 153/84 (BP Location: Right Arm)   Pulse 99   Temp 97.5 F (36.4 C) (Oral)   Resp 20   SpO2 100%   Physical Exam  Constitutional: She appears well-developed and well-nourished. No distress.  HENT:  Head: Normocephalic and atraumatic.  Nose: Rhinorrhea present.  Eyes: Conjunctivae are normal.  Neck: Neck supple.  Cardiovascular: Normal rate, regular rhythm and intact distal pulses.   No murmur heard. Pulmonary/Chest:  Effort normal. No respiratory distress. She has wheezes. She has no rhonchi.  Abdominal: Soft. There is no tenderness.  Musculoskeletal: She exhibits no edema.  Neurological: She is alert. No sensory deficit.  Skin: Skin is warm and dry. Capillary refill takes less than 2 seconds. No rash noted. She is not diaphoretic. No erythema.  Psychiatric: She has a normal mood and affect.  Nursing note and vitals reviewed.    ED Treatments / Results  Labs (all labs ordered are listed, but only abnormal results are displayed) Labs Reviewed  CBC WITH DIFFERENTIAL/PLATELET - Abnormal; Notable for the following:       Result Value   HCT 35.6 (*)    All other components within normal limits  BASIC METABOLIC PANEL  TROPONIN I    EKG  EKG Interpretation  Date/Time:  Tuesday February 10 2016 17:37:00 EST Ventricular Rate:  95 PR Interval:    QRS Duration: 124 QT Interval:  375 QTC Calculation: 472 R Axis:   -122 Text Interpretation:  Sinus rhythm Probable left atrial enlargement Nonspecific intraventricular conduction delay Probable lateral infarct, age indeterminate Baseline wander in lead(s) III When compared to prior, new t wave inversions in lead V2, V3, V4, V5 No STEMI Confirmed by Sherry Ruffing MD, [place] [phone]) on 02/10/2016 7:04:00 PM Also confirmed by Sherry Ruffing MD, [place] [phone]), editor [place]  CCT, [place] ([id number])  on 02/11/2016 6:58:50 AM       Radiology Dg Chest 2 View  Result Date: 02/10/2016 CLINICAL DATA:  Cough and shortness of breath. EXAM: CHEST  2 VIEW COMPARISON:  01/22/2016 FINDINGS: The heart size is stable and normal. There is a stable radiographic appearance to a biventricular pacing/ICD device. There is no evidence of pulmonary edema, consolidation, pneumothorax, nodule or pleural fluid. The thoracic spine demonstrates stable spondylosis. IMPRESSION: No acute findings.  Stable appearance of biventricular ICD. Electronically Signed   By: Aletta Edouard M.D.   On:  02/10/2016 16:20    Procedures Procedures (including critical care time)  Medications Ordered in ED Medications  ipratropium-albuterol (DUONEB) 0.5-2.5 (3) MG/3ML nebulizer solution 3 mL (3 mLs Nebulization Given 02/10/16 1537)  albuterol (PROVENTIL) (2.5 MG/3ML) 0.083% nebulizer solution 2.5 mg (2.5 mg Nebulization Given 02/10/16 1537)  methylPREDNISolone sodium succinate (SOLU-MEDROL) 125 mg/2 mL injection 125 mg (125 mg Intravenous Given 02/10/16 1739)  benzonatate (TESSALON) capsule 100 mg (100 mg Oral Given 02/10/16 2325)     Initial Impression / Assessment and Plan / ED Course  I have reviewed the triage vital signs and the nursing notes.  Pertinent labs & imaging results that were available during my care of the patient were reviewed by me and considered in my medical decision making (see chart for details).     Brenda Marsh is a 71 y.o. female with a past medical history significant for hypertension, hyperlipidemia, CHF with ICD, asthma, diabetes, and history of breast cancer who presents with wheezing, shortness of breath, and cough.  History and exam are seen above.  On exam, patient has severe wheezing in all lung fields. Patient did not have any focal rhonchi or crackles. Chest is nontender and abdomen was nontender. Patient had no rhinorrhea on exam. No lower extremity unilateral edema or tenderness.  Based on patient's coughing fits and severe wheezing with respiratory distress, patient will be given a breathing treatment and laboratory testing. Patient will be given Solu-Medrol. Patient will also have chest x-ray to look for pneumonia given her recent admission.  Chest x-ray shows no evidence of pneumonia. No acute findings were found.  Patient will be reassessed after initial workup.  Diagnostic workup results are seen above. Troponin negative. BNP unremarkable. CBC showed no anemia or Leukocytosis. EKG revealed no STEMI but there were some T-wave  inversions.  Patient reassessed after breathing treatment and have resolution of wheezing. Patient maintain her oxygen saturation on room air. Patient breathe better after steroids given.   Patient given a dose of Tessalon after she had repeated coughing fits. Patient appeared well.  Patient will be discharged with plans to take her cough medicine as well as steroids for her likely asthma exacerbation. Do not feel patient has pneumonia at this time. Patient and family understood return precautions for any new or worsening symptoms. Patient will follow up with PCP in the next 24 to 48 hours. Strict return precautions were given and family was discharged in good condition.   Final Clinical Impressions(s) / ED Diagnoses   Final diagnoses:  Cough  Moderate asthma with exacerbation, unspecified whether persistent    New Prescriptions Discharge Medication List as of 02/10/2016 11:21 PM    START taking these medications   Details  benzonatate (TESSALON PERLES) 100 MG capsule Take 1 capsule (100 mg total) by mouth 3 (three) times daily as needed for cough., Starting Tue 02/10/2016, Print    predniSONE (DELTASONE) 50 MG tablet Take 1 tablet (50 mg total) by mouth daily., Starting Tue 02/10/2016, Until Sun 02/15/2016, Print       Clinical Impression: 1. Cough   2. Moderate asthma with exacerbation, unspecified whether persistent     Disposition: Discharge  Condition: Good  I have discussed the results, Dx and Tx plan with the pt(& family if present). He/she/they expressed understanding and agree(s) with the plan. Discharge instructions discussed at great length. Strict return precautions discussed and pt &/or family have verbalized understanding of the instructions. No further questions at time of discharge.    Discharge Medication List as of 02/10/2016 11:21 PM    START taking these medications   Details  benzonatate (TESSALON PERLES) 100 MG capsule Take 1 capsule (100 mg total) by mouth  3 (three) times daily as needed for cough., Starting Tue 02/10/2016, Print    predniSONE (DELTASONE) 50 MG tablet Take 1 tablet (50 mg total) by mouth daily., Starting Tue 02/10/2016, Until Sun 02/15/2016, Print        Follow Up: Mosie Lukes, MD [address] [place] [address] [place] [address] [phone]     [facility] [address] [id number] Eula Fried [place] [id number]  If symptoms worsen     Courtney Paris, MD 02/11/16 [phone]

## 2016-02-10 NOTE — ED Triage Notes (Signed)
C/o cough with hx of asthma. O2 sats 99%, HR 102 by nursefirst. RT assessing at this time

## 2016-02-10 NOTE — Telephone Encounter (Signed)
New Message  Pt c/o medication issue:  1. Name of Medication: Carvedilol (Coreg) 12.5 mg tablet twice daily  2. How are you currently taking this medication (dosage and times per day)? See above  3. Are you having a reaction (difficulty breathing--STAT)? Yes  4. What is your medication issue? Pt voiced went to ED: 1.3 & 1.4-twice on the 4th. Pt voiced she is having an allergic reaction to medication. Coughing and cannot breath.  Pt voiced she went to ED the night of 1.3 and d/c that morning at 5 am. Pt voiced she went to church on 1.4 and the ambulance picked her up from church and took her back to ED.

## 2016-02-10 NOTE — Discharge Instructions (Signed)
Please take your cough medicine to help with coughing fits. Please take her steroids to help with your asthma exacerbation. Please use your home albuterol to help with her breathing. If symptoms worsen or new symptoms develop, please return to the nearest emergency department.

## 2016-02-10 NOTE — Telephone Encounter (Signed)
Spoke with patient and she stated she has been having shortness of breath and coughing since 1:00 am. She went to ED first part of January secondary and they d/c her Losartan. She has not had any episodes of this since she left until last night. Stated she thinks it is coming from the Carvedilol. She did have an issue with this in December just before Christmas when they changed her pill manufacturer. She went back to previous manufacturer and was better until around 01/21/16 when she went to ED. Did get the new manufacturer this last refill but does not think that is the issue since she has had breathing issues with both. She was diagnosed with GERD but does not feel she has this or that is the problem. Stated she felt like pepper in her throat but does not feel as though it is closing up. She does have an upcoming appointment with Dr Ardis Hughs GI but not until March. She has not had her Protonix this am, advised to take. She has been using the Albuterol today with no help. Denies any swelling but does not weigh herself daily. She does not want to go to ED. Will forward to Dr Stanford Breed for recommendations as requested. Did advised if worse to call 911

## 2016-02-10 NOTE — Telephone Encounter (Signed)
First available 1/24 am Will forward to Dr Stanford Breed to review to make sure ok to wait until tomorrow

## 2016-02-11 ENCOUNTER — Encounter: Payer: Self-pay | Admitting: Cardiology

## 2016-02-11 ENCOUNTER — Ambulatory Visit (INDEPENDENT_AMBULATORY_CARE_PROVIDER_SITE_OTHER): Payer: Medicare HMO | Admitting: Cardiology

## 2016-02-11 DIAGNOSIS — Z853 Personal history of malignant neoplasm of breast: Secondary | ICD-10-CM

## 2016-02-11 DIAGNOSIS — E66812 Obesity, class 2: Secondary | ICD-10-CM | POA: Insufficient documentation

## 2016-02-11 DIAGNOSIS — I428 Other cardiomyopathies: Secondary | ICD-10-CM

## 2016-02-11 DIAGNOSIS — I10 Essential (primary) hypertension: Secondary | ICD-10-CM

## 2016-02-11 DIAGNOSIS — J9801 Acute bronchospasm: Secondary | ICD-10-CM

## 2016-02-11 DIAGNOSIS — E782 Mixed hyperlipidemia: Secondary | ICD-10-CM

## 2016-02-11 DIAGNOSIS — Z95810 Presence of automatic (implantable) cardiac defibrillator: Secondary | ICD-10-CM | POA: Diagnosis not present

## 2016-02-11 MED ORDER — METOPROLOL TARTRATE 25 MG PO TABS: 25.0000 mg | ORAL_TABLET | Freq: Two times a day (BID) | ORAL | 11 refills | Status: DC

## 2016-02-11 NOTE — Assessment & Plan Note (Signed)
H/O breast cancer treated with lumpectomy, radiation, and chemotherapy in 2000

## 2016-02-11 NOTE — Assessment & Plan Note (Signed)
>>  ASSESSMENT AND PLAN FOR MORBID OBESITY (HCC) WRITTEN ON 02/11/2016 10:07 AM BY KILROY, LUKE K, PA-C  BMI 40

## 2016-02-11 NOTE — Assessment & Plan Note (Signed)
Statin intolerant 

## 2016-02-11 NOTE — Assessment & Plan Note (Signed)
MDT BiV ICD implanted Oct 2015- pt has been a responder

## 2016-02-11 NOTE — Assessment & Plan Note (Signed)
The pt describes recurrent cough and acute respiratory difficulty. Wheezing was noted on exam in the ED.

## 2016-02-11 NOTE — Assessment & Plan Note (Addendum)
Adequate control. Intolerant to ACE secondary to cough and taken off ARB in Jan secondary to cough.

## 2016-02-11 NOTE — Assessment & Plan Note (Signed)
Most recent EF 50%-Jan 2018

## 2016-02-11 NOTE — Progress Notes (Signed)
02/11/2016 Brenda Marsh   [birth year]  [id number]  Primary Physician [name], MD Primary Cardiologist: Dr Stanford Breed  HPI:  Pleasant 71 y/o AA female with a history of a NICM. The pt had a cath in March 2010 that showed non obstructive LAD disease. Her EF in Aug 2015 was 25%. She has a LBBB and in Oct 2015 she had a BiV ICD placed. In Nov 2015 her EF had improved to 40%, and most recently Jan 2018 her EF was 50%.   The pt tells me that around Christmas 2017 her Coreg generic apparently changed manufacturers. After this she noted episodes of coughing and SOB. She called the office and we had her switched back to her previous preparation of Carvedilol. despite this she continued to have problems. She ended up being admitted after a near syncopal spell 01/22/16. She was not in CHF and her ICD did not demonstrate any arrhythmia. Her EF by echo was 50%. She was discharged but continued to have episodic dyspnea, coughing, and near syncope. Last Night she was in the ED after she woke up SOB and coughing. The pt's granddaughter is an ED RN at [facility] and by her description and the ED MD's description she apparently had a severe episode. Dr Sherry Ruffing the ER MD did describe active wheezing. He placed her on a steroid dose pack. The pt does not previously have a history of asthma.    Current Outpatient Prescriptions  Medication Sig Dispense Refill  . albuterol (PROVENTIL HFA;VENTOLIN HFA) 108 (90 Base) MCG/ACT inhaler Inhale 2 puffs into the lungs every 6 (six) hours as needed for wheezing or shortness of breath. 1 Inhaler 2  . benzonatate (TESSALON PERLES) 100 MG capsule Take 1 capsule (100 mg total) by mouth 3 (three) times daily as needed for cough. 20 capsule 0  . Cholecalciferol (VITAMIN D) 400 UNITS capsule Take 400 Units by mouth daily.    . furosemide (LASIX) 20 MG tablet TAKE 1 TABLET BY MOUTH DAILY 30 tablet 11  . meclizine (ANTIVERT) 12.5 MG tablet Take 1 tablet (12.5 mg total) by mouth 3  (three) times daily as needed for dizziness. 90 tablet 3  . Multiple Vitamin (MULTIVITAMIN) capsule Take 1 capsule by mouth at bedtime.     . pantoprazole (PROTONIX) 40 MG tablet Take 1 tablet (40 mg total) by mouth 2 (two) times daily. 60 tablet 0  . predniSONE (DELTASONE) 50 MG tablet Take 1 tablet (50 mg total) by mouth daily. 5 tablet 0  . spironolactone (ALDACTONE) 25 MG tablet TAKE 1/2 TABLET (12.5 MG TOTAL) BY MOUTH DAILY. 45 tablet 3  . metoprolol tartrate (LOPRESSOR) 25 MG tablet Take 1 tablet (25 mg total) by mouth 2 (two) times daily. 60 tablet 11   No current facility-administered medications for this visit.     Allergies  Allergen Reactions  . Contrast Media [Iodinated Diagnostic Agents] Nausea And Vomiting    N&V  . Crestor [Rosuvastatin] Other (See Comments)    Myalgia, weakness  . Atorvastatin Other (See Comments)    Myalgia, weakness  . Lisinopril Other (See Comments)    REACTION: cough  . Naproxen Sodium Other (See Comments)    REACTION: Breathing difficulty  . Penicillins Other (See Comments)    Has patient had a PCN reaction causing immediate rash, facial/tongue/throat swelling, SOB or lightheadedness with hypotension: No Has patient had a PCN reaction causing severe rash involving mucus membranes or skin necrosis: No Has patient had a PCN reaction that required  hospitalization No Has patient had a PCN reaction occurring within the last 10 years: No If all of the above answers are "NO", then may proceed with Cephalosporin use.REAC  . Shellfish Allergy Other (See Comments)    Cant breath   . Simvastatin Other (See Comments)    REACTION: muscle aches    Social History   Social History  . Marital status: Widowed    Spouse name: N/A  . Number of children: 2  . Years of education: N/A   Occupational History  .  Disabled   Social History Main Topics  . Smoking status: Former Smoker    Years: 2.50    Types: Cigarettes    Quit date: 01/18/1970  . Smokeless  tobacco: Never Used     Comment: "smoked maybe 1 pack/month when I did smoke  . Alcohol use No  . Drug use: No  . Sexual activity: Not on file   Other Topics Concern  . Not on file   Social History Narrative   Widow - husband died in May 10, 2003   Occupation - retired from working in Radiation protection practitioner   2 daughter - on lives in [place], other in [place]   Remote history of tobacco but none in 40 years   Alcohol use - no     Review of Systems: General: negative for chills, fever, night sweats or weight changes.  Cardiovascular: negative for chest pain, dyspnea on exertion, edema, orthopnea, palpitations, paroxysmal nocturnal dyspnea or shortness of breath Dermatological: negative for rash Respiratory: negative for cough or wheezing Urologic: negative for hematuria Abdominal: negative for nausea, vomiting, diarrhea, bright red blood per rectum, melena, or hematemesis Neurologic: negative for visual changes, syncope, or dizziness All other systems reviewed and are otherwise negative except as noted above.    Blood pressure (!) 143/76, pulse 83, height 5' 0.5" (1.537 m), weight 212 lb 6.4 oz (96.3 kg).  General appearance: alert, cooperative, no distress and moderately obese Neck: no carotid bruit and no JVD Lungs: clear to auscultation bilaterally Heart: regular rate and rhythm Extremities: no edema Skin: Skin color, texture, turgor normal. No rashes or lesions Neurologic: Grossly normal   ASSESSMENT AND PLAN:   Cough due to bronchospasm The pt describes recurrent cough and acute respiratory difficulty. Wheezing was noted on exam in the ED.   Nonischemic cardiomyopathy ([place]) Most recent EF 50%-Jan 09-May-2016  ICD (implantable cardioverter-defibrillator), biventricular, in situ MDT BiV ICD implanted Oct 2015- pt has been a responder  History of breast cancer H/O breast cancer treated with lumpectomy, radiation, and chemotherapy in 05-10-98  Morbid obesity ([place]) BMI 40  Essential  hypertension Adequate control. Intolerant to ACE secondary to cough and taken off ARB in Jan secondary to cough.   Hyperlipidemia, mixed Statin intolerant   PLAN She is not in CHF on exam. Since she now has documented wheezing I changed the pt's Coreg to Lopressor (a more selective beta blocker). She has an appointment with Dr Elsworth Soho tomorrow and Dr Ardis Hughs next month. Consider resuming her ARB at some point if possible. I discussed a sleep study with her but she is not interested in that at this time. F/U Dr Stanford Breed in 3 months.   Kerin Ransom PA-C 02/11/2016 10:09 AM

## 2016-02-11 NOTE — Assessment & Plan Note (Signed)
BMI 40 

## 2016-02-11 NOTE — Patient Instructions (Signed)
Medication Instructions:  Your physician has recommended you make the following change in your medication:  1) STOP  Carvedilol 2) START Metoprolol 25mg  twice daily. An Rx has been sent to your pharmacy   Labwork: None ordered  Testing/Procedures: None ordered  Follow-Up: Your physician recommends that you schedule a follow-up appointment in: 3 months with Dr.Crenshaw   Any Other Special Instructions Will Be Listed Below (If Applicable). Please call the office if your symptoms do not improve     If you need a refill on your cardiac medications before your next appointment, please call your pharmacy.

## 2016-02-12 ENCOUNTER — Encounter: Payer: Self-pay | Admitting: Pulmonary Disease

## 2016-02-12 ENCOUNTER — Ambulatory Visit (INDEPENDENT_AMBULATORY_CARE_PROVIDER_SITE_OTHER): Payer: Medicare HMO | Admitting: Pulmonary Disease

## 2016-02-12 VITALS — BP 149/81 | HR 66 | Ht 60.5 in | Wt 216.0 lb

## 2016-02-12 DIAGNOSIS — J399 Disease of upper respiratory tract, unspecified: Secondary | ICD-10-CM

## 2016-02-12 DIAGNOSIS — J9801 Acute bronchospasm: Secondary | ICD-10-CM

## 2016-02-12 DIAGNOSIS — J989 Respiratory disorder, unspecified: Secondary | ICD-10-CM | POA: Diagnosis not present

## 2016-02-12 NOTE — Progress Notes (Signed)
Subjective:    Patient ID: Brenda Marsh, female    DOB: 02-Mar-1945, 71 y.o.   MRN: [id number]  HPI  Chief Complaint  Patient presents with  . Advice Only    self referral for worsening SOB, wheezing, choking sensation X1 month.     71 year old remote smoker referred for acute episodes of cough and bronchospasm.  She has a history of nonischemic cardiomyopathy noted in 2010 which has been attributed to chemotherapy that she received for breast cancer. She had severe LV dysfunction and left bundle branch block and underwent biventricular ICD placement in 2015 -she was maintained on losartan and Coreg since then  Around 01/07/16, she had her first episode. She had just filled a new prescription for Coreg from the pharmacy and this was a Acupuncturist. She was switched back by the pharmacy but in spite of this had an ER visit on 1/3 and then was admitted again when she had an episode at church.    Adm 1/3 to 01/23/16  Recommended - Stop Cozaar.  D dimer mildly elevated. VQ scan negative for PE. Doppler negative for DVT.   Cardiology consulted ECHO normal EF.  Will try Protonix to treat reflux.   She was again seen 1/23 in the ED and given a prednisone Dosepak.  She describes an episode as an acute onset, upper airway wheezing, with cough that lasts for about 2-3 minutes and then subside spontaneously.  She denies childhood history of asthma, no history of prior intubations. History of proximal nocturnal dyspnea or orthopnea. She denies obvious heartburn, but reports perennial nasal rhinitis and postnasal drip  She has appointments lined up with gastroenterology, allergy. Coreg was finally stopped yesterday on a visit to cardiology.  Interestingly on a follow-up echo her EF has recovered to 50%  Past Medical History:  Diagnosis Date  . AICD (automatic cardioverter/defibrillator) present   . Anemia 07/16/2012  . Arthritis    left knee pain, MRI in  2008-tricompartmental  degenerate changes, mucoid degeneration of ACL, post horn meniscal tear and ant horn meniscal tear  . Breast cancer in female [facility]) 2000   left; S/P lumpectomy, radiation and chemotherapy  . CHF (congestive heart failure) ([place])   . Colon polyp    unclear pathology  . Diabetes mellitus type 2, diet-controlled ([place]) 03/11/2009   Qualifier: Diagnosis of  By: Wynona Luna  (boderline )  . Hyperlipidemia   . Hypertension   . LBBB (left bundle branch block)   . Low back pain    left L3-4 injected  . Lymphedema of left arm   . Nonischemic cardiomyopathy ([place])   . Presence of permanent cardiac pacemaker   . Travel sickness 05/12/2015  . Unspecified constipation 04/22/2012   Past Surgical History:  Procedure Laterality Date  . APPENDECTOMY  2009   Dr. Lucia Gaskins  . BI-VENTRICULAR IMPLANTABLE CARDIOVERTER DEFIBRILLATOR N/A 11/16/2013   Procedure: BI-VENTRICULAR IMPLANTABLE CARDIOVERTER DEFIBRILLATOR  (CRT-D);  Surgeon: Evans Lance, MD;  Location: [facility] C[facility] LAB;  Service: Cardiovascular;  Laterality: N/A;  . BI-VENTRICULAR IMPLANTABLE CARDIOVERTER DEFIBRILLATOR  (CRT-D)  11/16/13   MDT CRTD implanted by Dr Lovena Le for NICM, CHF, and LBBB  . BREAST BIOPSY Left 2000  . BREAST LUMPECTOMY Left 2000  . CARDIAC CATHETERIZATION  ~ 2010   "no blockage"  . CARPAL TUNNEL RELEASE Bilateral 2007   Dr. Daylene Katayama  . KNEE ARTHROSCOPY Right   . TONSILLECTOMY    . TRIGGER FINGER RELEASE Bilateral 2007  .  VAGINAL HYSTERECTOMY     Paritial      Social History   Social History  . Marital status: Widowed    Spouse name: N/A  . Number of children: 2  . Years of education: N/A   Occupational History  .  Disabled   Social History Main Topics  . Smoking status: Former Smoker    Packs/day: 0.10    Years: 2.00    Types: Cigarettes    Quit date: 01/18/1970  . Smokeless tobacco: Never Used     Comment: "smoked maybe 1 pack/month when I did smoke  . Alcohol use No  . Drug use: No    . Sexual activity: Not on file   Other Topics Concern  . Not on file   Social History Narrative   Widow - husband died in 05/05/03   Occupation - retired from working in Radiation protection practitioner   2 daughter - on lives in [place], other in [place]   Remote history of tobacco but none in 40 years   Alcohol use - no     Family History  Problem Relation Age of Onset  . Heart attack Mother 9  . Diabetes Sister   . Diabetes Brother   . Coronary artery disease Other     Female first degree relative <60  . Hyperlipidemia Other   . Hypertension Other   . Cancer Other     prostate, 1st degree relative <50     Review of Systems  Constitutional: Negative for fever and unexpected weight change.  HENT: Negative for congestion, dental problem, ear pain, nosebleeds, postnasal drip, rhinorrhea, sinus pressure, sneezing, sore throat and trouble swallowing.   Eyes: Negative for redness and itching.  Respiratory: Positive for chest tightness, shortness of breath and wheezing. Negative for cough.   Cardiovascular: Negative for palpitations and leg swelling.  Gastrointestinal: Negative for nausea and vomiting.  Genitourinary: Negative for dysuria.  Musculoskeletal: Negative for joint swelling.  Skin: Negative for rash.  Neurological: Negative for headaches.  Hematological: Does not bruise/bleed easily.  Psychiatric/Behavioral: Negative for dysphoric mood. The patient is not nervous/anxious.        Objective:   Physical Exam   Gen. Pleasant, obese, in no distress ENT - no lesions, no post nasal drip Neck: No JVD, no thyromegaly, no carotid bruits Lungs: no use of accessory muscles, no dullness to percussion, decreased without rales or rhonchi  Cardiovascular: Rhythm regular, heart sounds  normal, no murmurs or gallops, no peripheral edema Musculoskeletal: No deformities, no cyanosis or clubbing , no tremors        Assessment & Plan:

## 2016-02-12 NOTE — Patient Instructions (Signed)
Your symptoms are likely related to Coreg  You can start taking Claritin again daily Decrease prednisone to half tablet daily for 4 days and then stop  If you have another episode, take 2 puffs of albuterol, then concentrate on slow deep breathing until episode passes

## 2016-02-12 NOTE — Assessment & Plan Note (Addendum)
symptoms are likely related to Coreg -doubt this is angioedema since no swelling actually noted on repeated ER visits, but acute episodes of bronchospasm ? Related to upper airway She has stopped taking Coreg and if so, episodes should not recur Agree with GI evaluation. No reason to suspect tracheostenosis(no history of prior intubations]  You can start taking Claritin again daily Decrease prednisone to half tablet daily for 4 days and then stop  If you have another episode, take 2 puffs of albuterol, then concentrate on slow deep breathing until episode passes Referral to ENT for upper airway exam

## 2016-02-13 NOTE — Progress Notes (Signed)
Thanks Dr Albesa Seen

## 2016-02-18 ENCOUNTER — Telehealth: Payer: Self-pay | Admitting: Family Medicine

## 2016-02-18 MED ORDER — PANTOPRAZOLE SODIUM 40 MG PO TBEC: 40.0000 mg | DELAYED_RELEASE_TABLET | Freq: Two times a day (BID) | ORAL | 0 refills | Status: DC

## 2016-02-18 NOTE — Telephone Encounter (Signed)
I sent in the rx to her pharmacy. Would you notify pt that it was sent in.

## 2016-02-18 NOTE — Telephone Encounter (Signed)
Resent with request number attached to script provided

## 2016-02-18 NOTE — Telephone Encounter (Signed)
Patient is requesting a refill for medication that was given to her at the hospital she sees specialist next month. Medication is pantotrazole 40 mg tabs sent in [facility] in [place] please advise rx#  [id number] patient home [phone] cell

## 2016-02-19 NOTE — Telephone Encounter (Signed)
Left message

## 2016-02-20 ENCOUNTER — Ambulatory Visit (INDEPENDENT_AMBULATORY_CARE_PROVIDER_SITE_OTHER): Payer: Medicare HMO | Admitting: Allergy & Immunology

## 2016-02-20 ENCOUNTER — Encounter: Payer: Self-pay | Admitting: Allergy & Immunology

## 2016-02-20 VITALS — BP 128/66 | HR 68 | Temp 98.1°F | Resp 16 | Ht 63.98 in | Wt 211.2 lb

## 2016-02-20 DIAGNOSIS — T7840XD Allergy, unspecified, subsequent encounter: Secondary | ICD-10-CM

## 2016-02-20 DIAGNOSIS — J4520 Mild intermittent asthma, uncomplicated: Secondary | ICD-10-CM

## 2016-02-20 LAB — CBC WITH DIFFERENTIAL/PLATELET
Basophils Absolute: 0 cells/uL (ref 0–200)
Basophils Relative: 0 %
EOS ABS: 462 {cells}/uL (ref 15–500)
Eosinophils Relative: 6 %
HEMATOCRIT: 35.9 % (ref 35.0–45.0)
HEMOGLOBIN: 11.7 g/dL (ref 11.7–15.5)
LYMPHS PCT: 34 %
Lymphs Abs: 2618 cells/uL (ref 850–3900)
MCH: 30.1 pg (ref 27.0–33.0)
MCHC: 32.6 g/dL (ref 32.0–36.0)
MCV: 92.3 fL (ref 80.0–100.0)
MONO ABS: 539 {cells}/uL (ref 200–950)
MPV: 9.5 fL (ref 7.5–12.5)
Monocytes Relative: 7 %
NEUTROS ABS: 4081 {cells}/uL (ref 1500–7800)
Neutrophils Relative %: 53 %
Platelets: 229 10*3/uL (ref 140–400)
RBC: 3.89 MIL/uL (ref 3.80–5.10)
RDW: 14.5 % (ref 11.0–15.0)
WBC: 7.7 10*3/uL (ref 3.8–10.8)

## 2016-02-20 MED ORDER — EPINEPHRINE 0.3 MG/0.3ML IJ SOAJ: 0.3000 mg | Freq: Once | INTRAMUSCULAR | 1 refills | Status: AC

## 2016-02-20 NOTE — Patient Instructions (Addendum)
1. Allergic reaction - likely carvedilol - We will get some lab work today instead of doing skin testing. - I agree that the carvedilol was likely the trigger of the reaction but we will rule out some other possibilities. - We will look for food allergies (wheat, peanut, tree nut, soy, milk, egg). - Take a good diary of exposures including foods to see whether these might be triggers. - Agree with a GI referral. - Continue with Claritin or another antihistamine daily.  - We will call you in one or two weeks with the results.  - EpiPen training provided.   2. Return in about 3 months (around 05/19/2016).  Please inform us of any Emergency Department visits, hospitalizations, or changes in symptoms. Call us before going to the ED for breathing or allergy symptoms since we might be able to fit you in for a sick visit. Feel free to contact us anytime with any questions, problems, or concerns.  It was a pleasure to meet you today! Best wishes in the [place] Year!   Websites that have reliable patient information: 1. American Academy of Asthma, Allergy, and Immunology: www.aaaai.org 2. Food Allergy Research and Education (FARE): foodallergy.org 3. Mothers of Asthmatics: http://www.asthmacommunitynetwork.org 4. American College of Allergy, Asthma, and Immunology: www.acaai.org

## 2016-02-20 NOTE — Progress Notes (Signed)
NEW PATIENT  Date of Service/Encounter:  02/20/16  Referring provider: [name], MD   Assessment:   Allergic reaction, - likely from carvedilol    Plan/Recommendations:   1. Allergic reaction - likely carvedilol - We will get some lab work today instead of doing skin testing. - I agree that the carvedilol was likely the trigger of the reaction but we will rule out some other possibilities. - We will look for food allergies (wheat, peanut, tree nut, soy, milk, egg). - Take a good diary of exposures including foods to see whether these might be triggers. - Agree with a GI referral. - Continue with Claritin or another antihistamine daily.  - We will call you in one or two weeks with the results.  - EpiPen training provided.   2. Return in about 3 months (around 05/19/2016).   Subjective:   Brenda Marsh is a 71 y.o. female presenting today for evaluation of  Chief Complaint  Patient presents with  . Cough  . Wheezing    [name] has a history of the following: Patient Active Problem List   Diagnosis Date Noted  . Cough due to bronchospasm 02/11/2016  . Morbid obesity ([place]) 02/11/2016  . Syncope 01/21/2016  . Travel sickness 05/12/2015  . Benign paroxysmal positional vertigo 12/01/2014  . Nonischemic cardiomyopathy ([place])   . ICD (implantable cardioverter-defibrillator), biventricular, in situ 02/26/2014  . Chronic combined systolic and diastolic CHF (congestive heart failure) ([place]) 11/16/2013  . Bilateral carotid artery disease ([place])   . Anemia 07/16/2012  . Hypokalemia 06/26/2012  . GERD 07/02/2009  . Hyperlipidemia, mixed 03/11/2009  . Essential hypertension 03/11/2009  . Osteoarthritis 03/11/2009  . Diabetes mellitus type 2, diet-controlled ([place]) 03/11/2009  . History of breast cancer 03/11/2009    History obtained from: chart review and patient.  [name] was referred by [name], MD.     Brenda Marsh is a 71 y.o. female  presenting for evaluation of allergic reacitons. In early December, shortly December, after starting carvedilol, she started having nighttime awakenings that occurred around 2 AM. She will wake up with acute episodes of shortness of breath. She is unsure what was triggering this. She experienced a particularly bad episode early in the morning of January 3. She went to the ER where workup was done including an EKG, BMP, CBC, and troponins. These were all normal. A chest x-ray was also normal. She was discharged around 6:00 in the morning.   Following the discharge, she went home and got some sleep and then went to church that morning. She tolerated church, after the service she became very faint in the parking lot and collapse. She was transferred via ambulance to the hospital she was admitted for observation. In the hospital, she was started on Protonix. A d-dimer was elevated but a further workup for pulmonary embolism was negative. Cardiology evaluated her and did not feel that it was heart related. The inpatient team felt that Cozaar might be related. Therefore, this was stopped at discharge.  Then on January 23rd, she had another episode of the shortness of breath. She tried using the inhaler and then started coughing from 1am through the rest of the day. Then she went to the ED where she was given two breathing treatments and prednisone. Carvediolol was finally stopped. She was discharged on prednisone for another few days. She was given the Tessalon perles.  Being off of the carvedilol has helped. She has not needed her inhaler and  has had no SOB episodes. She did go to see Pulmonology (Dr. Elsworth Soho) on 02/12/16. At that time, he felt that the the carvedilol was to blame. He weaned her off of the prednisone and recommended that she restart her Claritin. She was referred to ENT for an upper airway evaluation in the office setting.   [name] does have a history of seasonal rhinitis which is well  controlled with Claritin as needed. She has never needed an inhaler until these allergic rhinitis symptoms started. 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 Because of the breast cancer treatment, she did develop some heart problems including atrial fibrillation. She had a pacemaker installed and she is on a beta blocker,   She does report that she had a reaction to shrimp in 2005 including itching. She has avoided all seafood since that time. She also avoids peanuts and tree nuts as well. Otherwise, there is no history of other atopic diseases, including asthma, drug allergies, food allergies, stinging insect allergies, or urticaria. There is no significant infectious history. Vaccinations are up to date.    Past Medical History: Patient Active Problem List   Diagnosis Date Noted  . Cough due to bronchospasm 02/11/2016  . Morbid obesity ([place]) 02/11/2016  . Syncope 01/21/2016  . Travel sickness 05/12/2015  . Benign paroxysmal positional vertigo 12/01/2014  . Nonischemic cardiomyopathy ([place])   . ICD (implantable cardioverter-defibrillator), biventricular, in situ 02/26/2014  . Chronic combined systolic and diastolic CHF (congestive heart failure) ([place]) 11/16/2013  . Bilateral carotid artery disease ([place])   . Anemia 07/16/2012  . Hypokalemia 06/26/2012  . GERD 07/02/2009  . Hyperlipidemia, mixed 03/11/2009  . Essential hypertension 03/11/2009  . Osteoarthritis 03/11/2009  . Diabetes mellitus type 2, diet-controlled ([place]) 03/11/2009  . History of breast cancer 03/11/2009    Medication List:  Allergies as of 02/20/2016      Reactions   Contrast Media [iodinated Diagnostic Agents] Nausea And Vomiting   N&V   Crestor [rosuvastatin] Other (See Comments)   Myalgia, weakness   Atorvastatin Other (See Comments)   Myalgia, weakness   Lisinopril Other (See Comments)   REACTION: cough   Naproxen Sodium Other (See  Comments)   REACTION: Breathing difficulty   Penicillins Other (See Comments)      Shellfish Allergy Other (See Comments)   Cant breath   Simvastatin Other (See Comments)   REACTION: muscle aches      Medication List       Accurate as of 02/20/16  1:46 PM. Always use your most recent med list.          benzonatate 100 MG capsule Commonly known as:  TESSALON PERLES Take 1 capsule (100 mg total) by mouth 3 (three) times daily as needed for cough.   EPINEPHrine 0.3 mg/0.3 mL Soaj injection Commonly known as:  EPI-PEN Inject 0.3 mLs (0.3 mg total) into the muscle once.   furosemide 20 MG tablet Commonly known as:  LASIX TAKE 1 TABLET BY MOUTH DAILY   meclizine 12.5 MG tablet Commonly known as:  ANTIVERT Take 1 tablet (12.5 mg total) by mouth 3 (three) times daily as needed for dizziness.   metoprolol tartrate 25 MG tablet Commonly known as:  LOPRESSOR Take 1 tablet (25 mg total) by mouth 2 (two) times daily.   multivitamin capsule Take 1 capsule by mouth at bedtime.   pantoprazole 40 MG tablet Commonly known as:  PROTONIX Take 1 tablet (  40 mg total) by mouth 2 (two) times daily. [id number]   PROAIR HFA 108 (90 Base) MCG/ACT inhaler Generic drug:  albuterol Inhale 2 puffs into the lungs every 4 (four) hours as needed for wheezing or shortness of breath.   spironolactone 25 MG tablet Commonly known as:  ALDACTONE TAKE 1/2 TABLET (12.5 MG TOTAL) BY MOUTH DAILY.   Vitamin D 400 units capsule Take 400 Units by mouth daily.       Birth History: non-contributory.   Developmental History: non-contributory.    Past Surgical History: Past Surgical History:  Procedure Laterality Date  . APPENDECTOMY  2009   Dr. Lucia Gaskins  . BI-VENTRICULAR IMPLANTABLE CARDIOVERTER DEFIBRILLATOR N/A 11/16/2013   Procedure: BI-VENTRICULAR IMPLANTABLE CARDIOVERTER DEFIBRILLATOR  (CRT-D);  Surgeon: Evans Lance, MD;  Location: [facility] C[facility] LAB;  Service: Cardiovascular;  Laterality: N/A;  .  BI-VENTRICULAR IMPLANTABLE CARDIOVERTER DEFIBRILLATOR  (CRT-D)  11/16/13   MDT CRTD implanted by Dr Lovena Le for NICM, CHF, and LBBB  . BREAST BIOPSY Left 2000  . BREAST LUMPECTOMY Left 2000  . CARDIAC CATHETERIZATION  ~ 2010   "no blockage"  . CARPAL TUNNEL RELEASE Bilateral 2007   Dr. Daylene Katayama  . KNEE ARTHROSCOPY Right   . TONSILLECTOMY    . TRIGGER FINGER RELEASE Bilateral 2007  . VAGINAL HYSTERECTOMY     Paritial     Family History: Family History  Problem Relation Age of Onset  . Heart attack Mother 74  . Diabetes Sister   . Diabetes Brother   . Coronary artery disease Other     Female first degree relative <60  . Hyperlipidemia Other   . Hypertension Other   . Cancer Other     prostate, 1st degree relative <50  . Allergic rhinitis Neg Hx   . Angioedema Neg Hx   . Asthma Neg Hx   . Eczema Neg Hx   . Immunodeficiency Neg Hx   . Urticaria Neg Hx      Social History: Alexina lives at home with her family. She lives in a house that is 71 years old. There is carpeting throughout the home. They have electric heating and central cooling. There are no animals inside or outside of the home. She does have dust mite covers on the pillows. There is no smoking exposure. She works as a Scientist, product/process development part time. She did some 50 years ago but only around one pack per month.    Review of Systems: a 14-point review of systems is pertinent for what is mentioned in HPI.  Otherwise, all other systems were negative. Constitutional: negative other than that listed in the HPI Eyes: negative other than that listed in the HPI Ears, nose, mouth, throat, and face: negative other than that listed in the HPI Respiratory: negative other than that listed in the HPI Cardiovascular: negative other than that listed in the HPI Gastrointestinal: negative other than that listed in the HPI Genitourinary: negative other than that listed in the HPI Integument: negative other than that listed in the  HPI Hematologic: negative other than that listed in the HPI Musculoskeletal: negative other than that listed in the HPI Neurological: negative other than that listed in the HPI Allergy/Immunologic: negative other than that listed in the HPI    Objective:   Blood pressure 128/66, pulse 68, temperature 98.1 F (36.7 C), temperature source Oral, resp. rate 16, height 5' 3.98" (1.625 m), weight 211 lb 3.2 oz (95.8 kg). Body mass index is 36.28 kg/m.   Physical Exam:  General:  Alert, interactive, in no acute distress. Pleasant female. Eyes: No conjunctival injection present on the right, No conjunctival injection present on the left, PERRL bilaterally, No discharge on the right, No discharge on the left and No Horner-Trantas dots present Ears: Right TM pearly gray with normal light reflex, Left TM pearly gray with normal light reflex, Right TM intact without perforation and Left TM intact without perforation.  Nose/Throat: External nose within normal limits and septum midline, turbinates minimally edematous without discharge, post-pharynx unremarkable without cobblestoning in the posterior oropharynx. Tonsils 2+ without exudates Neck: Supple without thyromegaly. Adenopathy: no enlarged lymph nodes appreciated in the anterior cervical, occipital, axillary, epitrochlear, inguinal, or popliteal regions Lungs: Clear to auscultation without wheezing, rhonchi or rales. No increased work of breathing. CV: Normal S1/S2, no murmurs. Capillary refill <2 seconds.  Abdomen: Nondistended, nontender. No guarding or rebound tenderness. Bowel sounds faint and present in all fields  Skin: Warm and dry, without lesions or rashes. Extremities:  No clubbing, cyanosis or edema. Neuro:   Grossly intact. No focal deficits appreciated. Responsive to questions.  Diagnostic studies:  Spirometry: results normal (FEV1: 1.97/106%, FVC: 2.43/102%, FEV1/FVC: 81%).    Spirometry consistent with normal  pattern.  Allergy Studies: Deferred to due to presence of a beta blocker and pa on board     Salvatore Marvel, MD [facility]

## 2016-02-21 MED ORDER — EPINEPHRINE 0.3 MG/0.3ML IJ SOAJ: INTRAMUSCULAR | 1 refills | Status: DC

## 2016-02-23 LAB — CP584 ZONE 3
ALLERGEN, CEDAR TREE, T6: 0.86 kU/L — AB
Allergen, A. alternata, m6: <0.1 kU/L
Allergen, C. Herbarum, M2: <0.1 kU/L
Allergen, Mucor Racemosus, M4: <0.1 kU/L
Allergen, Mulberry, t76: <0.1 kU/L
Allergen, S. Botryosum, m10: <0.1 kU/L
Aspergillus fumigatus, m3: <0.1 kU/L
Bahia Grass: <0.1 kU/L
Bermuda Grass: <0.1 kU/L
Box Elder IgE: <0.1 kU/L
CAT DANDER: 9.49 kU/L — AB
Cockroach: <0.1 kU/L
Common Ragweed: <0.1 kU/L
Dog Dander: 15.1 kU/L — ABNORMAL HIGH
Johnson Grass: <0.1 kU/L
Nettle: 0.14 kU/L — ABNORMAL HIGH
Pecan/Hickory Tree IgE: <0.1 kU/L
Plantain: <0.1 kU/L
Rough Pigweed  IgE: <0.1 kU/L

## 2016-02-23 LAB — ALLERGEN MILK: Milk IgE: <0.1 kU/L

## 2016-02-23 LAB — ALLERGY PANEL 19, SEAFOOD GROUP
Fish Cod: <0.1 kU/L
Shrimp IgE: <0.1 kU/L
Tuna IgE: <0.1 kU/L

## 2016-02-23 LAB — ALLERGEN EGG WHITE F1: Egg White IgE: <0.1 kU/L

## 2016-02-23 LAB — ALLERGY PANEL 18, NUT MIX GROUP
Coconut: <0.1 kU/L
Hazelnut: <0.1 kU/L
Pecan Nut: <0.1 kU/L
Sesame Seed f10: <0.1 kU/L

## 2016-02-23 LAB — ALLERGEN, WHEAT, F4: Wheat IgE: <0.1 kU/L

## 2016-02-23 LAB — ALLERGEN SOYBEAN: Soybean IgE: <0.1 kU/L

## 2016-02-24 LAB — TRYPTASE: Tryptase: 8.3 ug/L (ref <11)

## 2016-03-03 ENCOUNTER — Ambulatory Visit (INDEPENDENT_AMBULATORY_CARE_PROVIDER_SITE_OTHER): Payer: Medicare HMO | Admitting: *Deleted

## 2016-03-03 DIAGNOSIS — I428 Other cardiomyopathies: Secondary | ICD-10-CM

## 2016-03-03 DIAGNOSIS — K219 Gastro-esophageal reflux disease without esophagitis: Secondary | ICD-10-CM | POA: Diagnosis not present

## 2016-03-03 NOTE — Progress Notes (Signed)
Remote ICD transmission.   

## 2016-03-04 ENCOUNTER — Encounter: Payer: Self-pay | Admitting: Cardiology

## 2016-03-05 LAB — CUP PACEART REMOTE DEVICE CHECK
Battery Remaining Longevity: 40 mo
Battery Voltage: 2.95 V
Brady Statistic AP VP Percent: 0.07 %
Brady Statistic AS VP Percent: 98.42 %
Brady Statistic RA Percent Paced: 0.09 %
HIGH POWER IMPEDANCE MEASURED VALUE: 77 Ohm
Implantable Lead Implant Date: 20151030
Implantable Lead Location: 753859
Implantable Lead Location: 753860
Implantable Lead Model: 4598
Implantable Lead Model: 5076
Implantable Pulse Generator Implant Date: 20151030
Lead Channel Impedance Value: 532 Ohm
Lead Channel Impedance Value: 589 Ohm
Lead Channel Impedance Value: 627 Ohm
Lead Channel Impedance Value: 722 Ohm
Lead Channel Impedance Value: 912 Ohm
Lead Channel Impedance Value: 950 Ohm
Lead Channel Pacing Threshold Amplitude: 0.5 V
Lead Channel Pacing Threshold Amplitude: 0.625 V
Lead Channel Pacing Threshold Pulse Width: 0.4 ms
Lead Channel Pacing Threshold Pulse Width: 0.4 ms
Lead Channel Pacing Threshold Pulse Width: 1 ms
Lead Channel Sensing Intrinsic Amplitude: 17 mV
Lead Channel Sensing Intrinsic Amplitude: 4.125 mV
Lead Channel Sensing Intrinsic Amplitude: 4.125 mV
Lead Channel Setting Pacing Amplitude: 2 V
Lead Channel Setting Pacing Pulse Width: 0.4 ms
MDC IDC LEAD IMPLANT DT: 20151030
MDC IDC LEAD IMPLANT DT: 20151030
MDC IDC LEAD LOCATION: 753858
MDC IDC MSMT LEADCHNL LV IMPEDANCE VALUE: 1102 Ohm
MDC IDC MSMT LEADCHNL LV IMPEDANCE VALUE: 1178 Ohm
MDC IDC MSMT LEADCHNL LV IMPEDANCE VALUE: 1235 Ohm
MDC IDC MSMT LEADCHNL LV IMPEDANCE VALUE: 437 Ohm
MDC IDC MSMT LEADCHNL LV IMPEDANCE VALUE: 760 Ohm
MDC IDC MSMT LEADCHNL LV PACING THRESHOLD AMPLITUDE: 1.5 V
MDC IDC MSMT LEADCHNL RA IMPEDANCE VALUE: 494 Ohm
MDC IDC MSMT LEADCHNL RV IMPEDANCE VALUE: 437 Ohm
MDC IDC MSMT LEADCHNL RV SENSING INTR AMPL: 17 mV
MDC IDC SESS DTM: 20180214072706
MDC IDC SET LEADCHNL LV PACING AMPLITUDE: 2.5 V
MDC IDC SET LEADCHNL LV PACING PULSEWIDTH: 1 ms
MDC IDC SET LEADCHNL RV PACING AMPLITUDE: 2.5 V
MDC IDC SET LEADCHNL RV SENSING SENSITIVITY: 0.3 mV
MDC IDC STAT BRADY AP VS PERCENT: 0.02 %
MDC IDC STAT BRADY AS VS PERCENT: 1.5 %
MDC IDC STAT BRADY RV PERCENT PACED: 72.09 %

## 2016-03-09 ENCOUNTER — Other Ambulatory Visit: Payer: Self-pay | Admitting: Allergy

## 2016-03-09 MED ORDER — EPINEPHRINE 0.3 MG/0.3ML IJ SOAJ: 0.3000 mg | Freq: Once | INTRAMUSCULAR | 2 refills | Status: AC

## 2016-04-02 ENCOUNTER — Encounter (INDEPENDENT_AMBULATORY_CARE_PROVIDER_SITE_OTHER): Payer: Self-pay

## 2016-04-02 ENCOUNTER — Ambulatory Visit (INDEPENDENT_AMBULATORY_CARE_PROVIDER_SITE_OTHER): Payer: Medicare HMO | Admitting: Gastroenterology

## 2016-04-02 ENCOUNTER — Other Ambulatory Visit: Payer: Self-pay

## 2016-04-02 ENCOUNTER — Encounter: Payer: Self-pay | Admitting: Gastroenterology

## 2016-04-02 VITALS — BP 120/64 | HR 72 | Ht 63.97 in | Wt 209.4 lb

## 2016-04-02 DIAGNOSIS — R1310 Dysphagia, unspecified: Secondary | ICD-10-CM

## 2016-04-02 MED ORDER — PANTOPRAZOLE SODIUM 40 MG PO TBEC: 40.0000 mg | DELAYED_RELEASE_TABLET | Freq: Two times a day (BID) | ORAL | 3 refills | Status: DC

## 2016-04-02 NOTE — Progress Notes (Signed)
HPI: This is a   very pleasant 71 year old woman  who was referred to me by Mackie Pai, PA-C  to evaluate  family history colon polyps, possible GERD .    Chief complaint is possible GERD, family history colon polyps  She tells me she has been getting a colonoscopy every 5 years in [place]. She does not have a family history of colon cancer. She does think that someone in her family has had polyps. She is pretty certain that she had colonoscopy 5 years ago in [facility] but I cannot find any records of that. She thinks polyps were found. Again I don't find any records of it.   2 or 3 months ago she had some shortness of breath in the middle the night. Some blood pressure medicines were stopped and she was told GERD was the cause. She was put on twice daily proton pump inhibitor. She really never gets heartburn. She does intermittently have swallowing difficulty with dysphagia to solids and liquids. This is only very intermittent and rare.  She has no symptoms significant abdominal pains, no overt GI bleeding, no significant changes in her bowels.   Old Data Reviewed: Colonoscopy September 2008 in [place] for "family history of colon cancer or polyps". Colonoscopy showed diverticulosis. She was recommended to have repeat colonoscopy at five-year interval  Upper endoscopy September 2008 in [facility] for GERD, noncardiac chest pain. EGD found, gastric fold was biopsied. The biopsies showed chronic inflammation without H. pylori. She was recommended to take proton pump inhibitors and to follow-up in their office 2 months later.  Capsule endoscopy November 2005 in [place]. Done for "Hemoccult-positive stool". Findings "suboptimal study, no small bowel pathology seen on this study".  Colonoscopy and upper endoscopy 2005 in [facility]. Both done for Hemoccult-positive stool. Hemorrhoids were noted. A small  gastric polyp was noted   Review of systems: Pertinent positive and negative review of systems were noted in the above HPI section. Complete review of systems was performed and was otherwise normal.   Past Medical History:  Diagnosis Date  . AICD (automatic cardioverter/defibrillator) present   . Anemia 07/16/2012  . Arthritis    left knee pain, MRI in 2008-tricompartmental  degenerate changes, mucoid degeneration of ACL, post horn meniscal tear and ant horn meniscal tear  . Breast cancer in female [facility]) 2000   left; S/P lumpectomy, radiation and chemotherapy  . CHF (congestive heart failure) ([place])   . Colon polyp    unclear pathology  . Diabetes mellitus type 2, diet-controlled ([place]) 03/11/2009   Qualifier: Diagnosis of  By: Wynona Luna  (boderline )  . Hyperlipidemia   . Hypertension   . LBBB (left bundle branch block)   . Low back pain    left L3-4 injected  . Lymphedema of left arm   . Nonischemic cardiomyopathy ([place])   . Presence of permanent cardiac pacemaker   . Travel sickness 05/12/2015  . Unspecified constipation 04/22/2012    Past Surgical History:  Procedure Laterality Date  . APPENDECTOMY  2009   Dr. Lucia Gaskins  . BI-VENTRICULAR IMPLANTABLE CARDIOVERTER DEFIBRILLATOR N/A 11/16/2013   Procedure: BI-VENTRICULAR IMPLANTABLE CARDIOVERTER DEFIBRILLATOR  (CRT-D);  Surgeon: Evans Lance, MD;  Location: [facility] CATH LAB;  Service: Cardiovascular;  Laterality: N/A;  . BI-VENTRICULAR IMPLANTABLE CARDIOVERTER DEFIBRILLATOR  (CRT-D)  11/16/13   MDT CRTD implanted by Dr Lovena Le for NICM, CHF, and LBBB  .  BREAST BIOPSY Left 2000  . BREAST LUMPECTOMY Left 2000  . CARDIAC CATHETERIZATION  ~ 2010   "no blockage"  . CARPAL TUNNEL RELEASE Bilateral 2007   Dr. Daylene Katayama  . KNEE ARTHROSCOPY Right   . TONSILLECTOMY    . TRIGGER FINGER RELEASE Bilateral 2007  . VAGINAL HYSTERECTOMY     Paritial    Current Outpatient Prescriptions  Medication Sig Dispense Refill  . albuterol (PROAIR  HFA) 108 (90 Base) MCG/ACT inhaler Inhale 2 puffs into the lungs every 4 (four) hours as needed for wheezing or shortness of breath.    . Cholecalciferol (VITAMIN D) 400 UNITS capsule Take 400 Units by mouth daily.    Marland Kitchen EPINEPHrine 0.3 mg/0.3 mL IJ SOAJ injection Use as directed for severe allergic reaction 2 Device 1  . furosemide (LASIX) 20 MG tablet TAKE 1 TABLET BY MOUTH DAILY 30 tablet 11  . meclizine (ANTIVERT) 12.5 MG tablet Take 1 tablet (12.5 mg total) by mouth 3 (three) times daily as needed for dizziness. 90 tablet 3  . metoprolol tartrate (LOPRESSOR) 25 MG tablet Take 1 tablet (25 mg total) by mouth 2 (two) times daily. 60 tablet 11  . Multiple Vitamin (MULTIVITAMIN) capsule Take 1 capsule by mouth at bedtime.     . pantoprazole (PROTONIX) 40 MG tablet Take 1 tablet (40 mg total) by mouth 2 (two) times daily. #[id number] 60 tablet 0  . spironolactone (ALDACTONE) 25 MG tablet TAKE 1/2 TABLET (12.5 MG TOTAL) BY MOUTH DAILY. 45 tablet 3   No current facility-administered medications for this visit.     Allergies as of 04/02/2016 - Review Complete 04/02/2016  Allergen Reaction Noted  . Contrast media [iodinated diagnostic agents] Nausea And Vomiting 05/26/2011  . Crestor [rosuvastatin] Other (See Comments) 05/12/2015  . Atorvastatin Other (See Comments) 05/12/2015  . Lisinopril Other (See Comments) 03/19/2009  . Naproxen sodium Other (See Comments)   . Penicillins Other (See Comments)   . Shellfish allergy Other (See Comments) 10/08/2013  . Simvastatin Other (See Comments) 03/19/2009    Family History  Problem Relation Age of Onset  . Heart attack Mother 46  . Diabetes Sister   . Diabetes Brother   . Coronary artery disease Other     Female first degree relative <60  . Hyperlipidemia Other   . Hypertension Other   . Cancer Other     prostate, 1st degree relative <50  . Allergic rhinitis Neg Hx   . Angioedema Neg Hx   . Asthma Neg Hx   . Eczema Neg Hx   . Immunodeficiency  Neg Hx   . Urticaria Neg Hx   . Colon cancer Neg Hx   . Stomach cancer Neg Hx   . Rectal cancer Neg Hx   . Esophageal cancer Neg Hx   . Liver cancer Neg Hx     Social History   Social History  . Marital status: Widowed    Spouse name: N/A  . Number of children: 2  . Years of education: N/A   Occupational History  .  Disabled   Social History Main Topics  . Smoking status: Former Smoker    Packs/day: 0.10    Years: 2.00    Types: Cigarettes    Quit date: 01/18/1970  . Smokeless tobacco: Never Used     Comment: "smoked maybe 1 pack/month when I did smoke  . Alcohol use No  . Drug use: No  . Sexual activity: Not on file   Other Topics Concern  .  Not on file   Social History Narrative   Widow - husband died in 04-26-03   Occupation - retired from working in Radiation protection practitioner   2 daughter - on lives in [place], other in [place]   Remote history of tobacco but none in 40 years   Alcohol use - no     Physical Exam: BP 120/64   Pulse 72   Ht 5' 3.97" (1.625 m)   Wt 209 lb 6 oz (95 kg)   BMI 35.97 kg/m  Constitutional: generally well-appearing Psychiatric: alert and oriented x3 Eyes: extraocular movements intact Mouth: oral pharynx moist, no lesions Neck: supple no lymphadenopathy Cardiovascular: heart regular rate and rhythm Lungs: clear to auscultation bilaterally Abdomen: soft, nontender, nondistended, no obvious ascites, no peritoneal signs, normal bowel sounds Extremities: no lower extremity edema bilaterally Skin: no lesions on visible extremities   Assessment and plan: 71 y.o. female with possible personal history of polyps, intermittent dysphasia first I have no records of any colonoscopy in 2011-04-26. She is pretty sure she had one done then. We will communicate with her previous gastroenterology group see if any of those records to be sent here if she indeed had them. After reviewing any further receive records from that GI group file determine the timing of her next  needed colonoscopy. The last one I have any records of his 04-26-2006. She does not have a family history of colon cancer. She also has only mild intermittent dysphagia but I recommended we proceed with EGD in the near future. We will schedule that only after knowing whether she needs a colonoscopy now. I'm not sure why she was started on twice daily proton pump inhibitor for some nighttime shortness of breath. I recommended she cut back to once daily proton X and perhaps if GERD is playing a role I recommended she try ranitidine OTC 150 mg pills at bedtime every night.   Please see the "Patient Instructions" section for addition details about the plan.   Owens Loffler, MD [facility] 04/02/2016, 1:14 PM  Cc: Mackie Pai, PA-C

## 2016-04-02 NOTE — Patient Instructions (Signed)
We will get your records from [facility].  Start taking ranitidine 150 mg at night.  Begin taking pantoprazole twice daily.    We will call you after reviewing your records with further recommendations.

## 2016-05-03 DIAGNOSIS — Z950 Presence of cardiac pacemaker: Secondary | ICD-10-CM | POA: Diagnosis not present

## 2016-05-03 DIAGNOSIS — G3184 Mild cognitive impairment, so stated: Secondary | ICD-10-CM | POA: Diagnosis not present

## 2016-05-03 DIAGNOSIS — Z0000 Encounter for general adult medical examination without abnormal findings: Secondary | ICD-10-CM | POA: Diagnosis not present

## 2016-05-03 DIAGNOSIS — H8110 Benign paroxysmal vertigo, unspecified ear: Secondary | ICD-10-CM | POA: Diagnosis not present

## 2016-05-03 DIAGNOSIS — Z973 Presence of spectacles and contact lenses: Secondary | ICD-10-CM | POA: Diagnosis not present

## 2016-05-03 DIAGNOSIS — J301 Allergic rhinitis due to pollen: Secondary | ICD-10-CM | POA: Diagnosis not present

## 2016-05-03 DIAGNOSIS — Z79899 Other long term (current) drug therapy: Secondary | ICD-10-CM | POA: Diagnosis not present

## 2016-05-03 DIAGNOSIS — Z87891 Personal history of nicotine dependence: Secondary | ICD-10-CM | POA: Diagnosis not present

## 2016-05-04 ENCOUNTER — Telehealth: Payer: Self-pay | Admitting: Gastroenterology

## 2016-05-04 NOTE — Telephone Encounter (Signed)
   Colonoscopy November 2013, Dr. Alice Reichert, [facility].  Indications "family history of colon cancer". I do not believe that is truly the case however see my recent office note. Findings 3 mm polyp at the hepatic flexure. Diverticulosis, internal hemorrhoids. The biopsies showed "fragment of colon mucosa with "focal adenomatous change".  She needs recall colonoscopy for "focal adenomatous change" November 2018.  She needs EGD now for dysphasia.

## 2016-05-05 NOTE — Telephone Encounter (Signed)
Left message on machine to call back  Recall colon in EPIC

## 2016-05-06 NOTE — Telephone Encounter (Signed)
EGD scheduled, pt instructed and medications reviewed.  Pre visit also scheduled. Patient to call with any questions or concerns.

## 2016-05-10 ENCOUNTER — Other Ambulatory Visit (INDEPENDENT_AMBULATORY_CARE_PROVIDER_SITE_OTHER): Payer: Medicare HMO

## 2016-05-10 DIAGNOSIS — E119 Type 2 diabetes mellitus without complications: Secondary | ICD-10-CM | POA: Diagnosis not present

## 2016-05-10 DIAGNOSIS — E785 Hyperlipidemia, unspecified: Secondary | ICD-10-CM | POA: Diagnosis not present

## 2016-05-10 DIAGNOSIS — I10 Essential (primary) hypertension: Secondary | ICD-10-CM | POA: Diagnosis not present

## 2016-05-10 LAB — TSH: TSH: 0.66 u[IU]/mL (ref 0.35–4.50)

## 2016-05-10 LAB — COMPREHENSIVE METABOLIC PANEL
ALT: 17 U/L (ref 0–35)
AST: 23 U/L (ref 0–37)
Albumin: 4.7 g/dL (ref 3.5–5.2)
Alkaline Phosphatase: 70 U/L (ref 39–117)
BUN: 12 mg/dL (ref 6–23)
CHLORIDE: 103 meq/L (ref 96–112)
CO2: 26 meq/L (ref 19–32)
CREATININE: 0.87 mg/dL (ref 0.40–1.20)
Calcium: 10.5 mg/dL (ref 8.4–10.5)
GFR: 82.58 mL/min (ref >60.00)
Glucose, Bld: 74 mg/dL (ref 70–99)
Potassium: 3.9 mEq/L (ref 3.5–5.1)
SODIUM: 138 meq/L (ref 135–145)
Total Bilirubin: 0.4 mg/dL (ref 0.2–1.2)
Total Protein: 8.9 g/dL — ABNORMAL HIGH (ref 6.0–8.3)

## 2016-05-10 LAB — MICROALBUMIN / CREATININE URINE RATIO
Creatinine,U: 65.3 mg/dL
MICROALB/CREAT RATIO: 1.1 mg/g (ref 0.0–30.0)
Microalb, Ur: <0.7 mg/dL (ref 0.0–1.9)

## 2016-05-10 LAB — CBC
HEMATOCRIT: 40.8 % (ref 36.0–46.0)
Hemoglobin: 13.7 g/dL (ref 12.0–15.0)
MCHC: 33.6 g/dL (ref 30.0–36.0)
MCV: 91.8 fl (ref 78.0–100.0)
Platelets: 282 10*3/uL (ref 150.0–400.0)
RBC: 4.44 Mil/uL (ref 3.87–5.11)
RDW: 14.7 % (ref 11.5–15.5)
WBC: 6.6 10*3/uL (ref 4.0–10.5)

## 2016-05-10 LAB — LIPID PANEL
Cholesterol: 301 mg/dL — ABNORMAL HIGH (ref 0–200)
HDL: 51.6 mg/dL (ref >39.00)
LDL CALC: 219 mg/dL — AB (ref 0–99)
NONHDL: 249.61
Total CHOL/HDL Ratio: 6
Triglycerides: 153 mg/dL — ABNORMAL HIGH (ref 0.0–149.0)
VLDL: 30.6 mg/dL (ref 0.0–40.0)

## 2016-05-10 LAB — HEMOGLOBIN A1C: HEMOGLOBIN A1C: 6.1 % (ref 4.6–6.5)

## 2016-05-14 NOTE — Progress Notes (Signed)
Pre visit review using our clinic review tool, if applicable. No additional management support is needed unless otherwise documented below in the visit note. 

## 2016-05-14 NOTE — Progress Notes (Signed)
Subjective:   Brenda Marsh is a 71 y.o. female who presents for an Initial Medicare Annual Wellness Visit.  The Patient was informed that the wellness visit is to identify future health risk and educate and initiate measures that can reduce risk for increased disease through the lifespan.   Describes health as fair, good or great? "I'm in good health."  Review of Systems    No ROS.  Medicare Wellness Visit.  Cardiac Risk Factors include: advanced age (>37men, >55 women);dyslipidemia;hypertension;obesity (BMI >30kg/m2)  Sleep patterns: no sleep issues.   Home Safety/Smoke Alarms: Feels safe in home. Smoke alarms in place.   Living environment; residence and Firearm Safety: Lives alone. 2-story house, number of inside stairs: 14, no firearms. Seat Belt Safety/Bike Helmet: Wears seat belt.   Counseling:   Eye Exam- Previously followed by Dr. Antionette Fairy but would like a referral to a new eye doctor. Dental- She plans to contact insurance company to find in network provider.  Female:   Pap- N/A, hysterectomy.      Mammo- last 07/24/15. BI-RADS CATEGORY  2: Benign.        Dexa scan- last 05/17/11. Normal.         CCS- last 10/14/06. Next due Nov. 2018.      Objective:    Today's Vitals   05/17/16 1332  BP: 133/73  Pulse: 62  Resp: 16  Temp: 97.7 F (36.5 C)  TempSrc: Oral  SpO2: 100%  Weight: 202 lb 9.6 oz (91.9 kg)  Height: 5\' 4"  (1.626 m)   Body mass index is 34.78 kg/m.   Wt Readings from Last 3 Encounters:  05/17/16 202 lb 9.6 oz (91.9 kg)  04/02/16 209 lb 6 oz (95 kg)  02/20/16 211 lb 3.2 oz (95.8 kg)    Current Medications (verified) Outpatient Encounter Prescriptions as of 05/17/2016  Medication Sig  . albuterol (PROAIR HFA) 108 (90 Base) MCG/ACT inhaler Inhale 2 puffs into the lungs every 4 (four) hours as needed for wheezing or shortness of breath.  . Cholecalciferol (VITAMIN D) 400 UNITS capsule Take 400 Units by mouth daily.  Marland Kitchen EPINEPHrine 0.3  mg/0.3 mL IJ SOAJ injection Use as directed for severe allergic reaction  . furosemide (LASIX) 20 MG tablet TAKE 1 TABLET BY MOUTH DAILY  . meclizine (ANTIVERT) 12.5 MG tablet Take 1 tablet (12.5 mg total) by mouth 3 (three) times daily as needed for dizziness.  . Multiple Vitamin (MULTIVITAMIN) capsule Take 1 capsule by mouth at bedtime.   . pantoprazole (PROTONIX) 40 MG tablet Take 1 tablet (40 mg total) by mouth 2 (two) times daily. #[id number] (Patient taking differently: Take 40 mg by mouth daily. #[id number])  . spironolactone (ALDACTONE) 25 MG tablet TAKE 1/2 TABLET (12.5 MG TOTAL) BY MOUTH DAILY.  . metoprolol tartrate (LOPRESSOR) 25 MG tablet Take 1 tablet (25 mg total) by mouth 2 (two) times daily.   No facility-administered encounter medications on file as of 05/17/2016.     Allergies (verified) Contrast media [iodinated diagnostic agents]; Crestor [rosuvastatin]; Metrizamide; Atorvastatin; Lisinopril; Naproxen sodium; Penicillins; Shellfish allergy; and Simvastatin   History: Past Medical History:  Diagnosis Date  . AICD (automatic cardioverter/defibrillator) present   . Anemia 07/16/2012  . Arthritis    left knee pain, MRI in 2008-tricompartmental  degenerate changes, mucoid degeneration of ACL, post horn meniscal tear and ant horn meniscal tear  . Breast cancer in female [facility]) 2000   left; S/P lumpectomy, radiation and chemotherapy  . CHF (congestive heart failure) ([place])   .  Colon polyp    unclear pathology  . Diabetes mellitus type 2, diet-controlled ([place]) 03/11/2009   Qualifier: Diagnosis of  By: Wynona Luna  (boderline )  . Hyperlipidemia   . Hypertension   . LBBB (left bundle branch block)   . Low back pain    left L3-4 injected  . Lymphedema of left arm   . Nonischemic cardiomyopathy ([place])   . Presence of permanent cardiac pacemaker   . Travel sickness 05/12/2015  . Unspecified constipation 04/22/2012   Past Surgical History:  Procedure Laterality Date  .  APPENDECTOMY  2009   Dr. Lucia Gaskins  . BI-VENTRICULAR IMPLANTABLE CARDIOVERTER DEFIBRILLATOR N/A 11/16/2013   Procedure: BI-VENTRICULAR IMPLANTABLE CARDIOVERTER DEFIBRILLATOR  (CRT-D);  Surgeon: Evans Lance, MD;  Location: [facility] CATH LAB;  Service: Cardiovascular;  Laterality: N/A;  . BI-VENTRICULAR IMPLANTABLE CARDIOVERTER DEFIBRILLATOR  (CRT-D)  11/16/13   MDT CRTD implanted by Dr Lovena Le for NICM, CHF, and LBBB  . BREAST BIOPSY Left 2000  . BREAST LUMPECTOMY Left 2000  . CARDIAC CATHETERIZATION  ~ 2010   "no blockage"  . CARPAL TUNNEL RELEASE Bilateral 2007   Dr. Daylene Katayama  . KNEE ARTHROSCOPY Right   . TONSILLECTOMY    . TRIGGER FINGER RELEASE Bilateral 2007  . VAGINAL HYSTERECTOMY     Paritial   Family History  Problem Relation Age of Onset  . Heart attack Mother 87  . Diabetes Sister   . Diabetes Brother   . Coronary artery disease Other     Female first degree relative <60  . Hyperlipidemia Other   . Hypertension Other   . Cancer Other     prostate, 1st degree relative <50  . Allergic rhinitis Neg Hx   . Angioedema Neg Hx   . Asthma Neg Hx   . Eczema Neg Hx   . Immunodeficiency Neg Hx   . Urticaria Neg Hx   . Colon cancer Neg Hx   . Stomach cancer Neg Hx   . Rectal cancer Neg Hx   . Esophageal cancer Neg Hx   . Liver cancer Neg Hx    Social History   Occupational History  .  Disabled   Social History Main Topics  . Smoking status: Former Smoker    Packs/day: 0.10    Years: 2.00    Types: Cigarettes    Quit date: 01/18/1970  . Smokeless tobacco: Never Used     Comment: "smoked maybe 1 pack/month when I did smoke  . Alcohol use No  . Drug use: No  . Sexual activity: No    Tobacco Counseling Counseling given: Not Answered   Activities of Daily Living In your present state of health, do you have any difficulty performing the following activities: 05/17/2016 01/21/2016  Hearing? N N  Vision? N N  Difficulty concentrating or making decisions? N N  Walking or  climbing stairs? N N  Dressing or bathing? N N  Doing errands, shopping? N N  Preparing Food and eating ? N -  Using the Toilet? N -  In the past six months, have you accidently leaked urine? N -  Do you have problems with loss of bowel control? N -  Managing your Medications? N -  Managing your Finances? N -  Housekeeping or managing your Housekeeping? N -  Some recent data might be hidden    Immunizations and Health Maintenance Immunization History  Administered Date(s) Administered  . Td 09/06/2006   Health Maintenance Due  Topic Date Due  .  FOOT EXAM  07/07/1955  . OPHTHALMOLOGY EXAM  07/07/1955  . COLONOSCOPY  10/14/2011    Patient Care Team: Mosie Lukes, MD as PCP - General (Family Medicine) Evans Lance, MD as Consulting Physician (Cardiology) Milus Banister, MD as Consulting Physician (Gastroenterology) Valentina Shaggy, MD as Consulting Physician (Allergy and Immunology) [name], MD as Consulting Physician (Oncology) Lelon Perla, MD as Consulting Physician (Cardiology) Melissa Montane, MD as Consulting Physician (Otolaryngology)  Indicate any recent Medical Services you may have received from other than [facility] providers in the past year (date may be approximate).     Assessment:   This is a routine wellness examination for Nathifa. Physical assessment deferred to PCP.  Hearing/Vision screen Hearing Screening Comments: Able to hear conversational tones w/o difficulty. No issues reported.  Vision Screening Comments: Wears eye glasses. Reports is due for eye exam.  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 Trying to cut back  on evening snacking. Biggest meal of the day is lunch. Drinks water throughout the day and tries to drink at least 8 glasses daily. Every now and then may have a small Sprite or Coke.  Goals    . Increase physical activity    . Weight (lb) < 200 lb (90.7 kg)      Depression Screen PHQ 2/9 Scores 05/17/2016 05/12/2015  PHQ - 2 Score 0 0    Fall Risk Fall Risk  05/17/2016 09/29/2015 05/12/2015 10/07/2014  Falls in the past year? No No No No    Cognitive Function: MMSE - Mini Mental State Exam 05/17/2016  Orientation to time 5  Orientation to Place 5  Registration 3  Attention/ Calculation 5  Recall 2  Language- name 2 objects 2  Language- repeat 1  Language- follow 3 step command 3  Language- read & follow direction 1  Write a sentence 1  Copy design 1  Total score 29        Screening Tests Health Maintenance  Topic Date Due  . FOOT EXAM  07/07/1955  . OPHTHALMOLOGY EXAM  07/07/1955  . COLONOSCOPY  10/14/2011  . PNA vac Low Risk Adult (1 of 2 - PCV13) 05/17/2017 (Originally 07/07/2010)  . INFLUENZA VACCINE  08/18/2016  . TETANUS/TDAP  09/05/2016  . HEMOGLOBIN A1C  11/09/2016  . URINE MICROALBUMIN  05/10/2017  . MAMMOGRAM  07/23/2017  . DEXA SCAN  Completed  . Hepatitis C Screening  Completed      Plan:    Follow-up w/ PCP as directed.  Bring a copy of your advance directives to your next office visit.  I have personally reviewed and noted the following in the patient's chart:   . Medical and social history . Use of alcohol, tobacco or illicit drugs  . Current medications and supplements . Functional ability and status . Nutritional status . Physical activity . Advanced directives . List of other physicians . Vitals . Screenings to include cognitive, depression, and falls . Referrals and appointments  In addition, I have reviewed and discussed with patient certain preventive protocols, quality metrics, and best practice recommendations. A written  personalized care plan for preventive services as well as general preventive health recommendations were provided to patient.  Dorrene German, RN   05/17/2016    Medical screening examination was performed by Health Coach and as supervising physician I was immediately available for consultation/collaboration. I have reviewed documentation and agree with assessment and plan.  [name], MD

## 2016-05-17 ENCOUNTER — Encounter: Payer: Self-pay | Admitting: Family Medicine

## 2016-05-17 ENCOUNTER — Ambulatory Visit (INDEPENDENT_AMBULATORY_CARE_PROVIDER_SITE_OTHER): Payer: Medicare HMO | Admitting: Family Medicine

## 2016-05-17 VITALS — BP 133/73 | HR 62 | Temp 97.7°F | Resp 16 | Ht 64.0 in | Wt 202.6 lb

## 2016-05-17 DIAGNOSIS — I10 Essential (primary) hypertension: Secondary | ICD-10-CM

## 2016-05-17 DIAGNOSIS — T7840XD Allergy, unspecified, subsequent encounter: Secondary | ICD-10-CM | POA: Diagnosis not present

## 2016-05-17 DIAGNOSIS — Z780 Asymptomatic menopausal state: Secondary | ICD-10-CM

## 2016-05-17 DIAGNOSIS — D229 Melanocytic nevi, unspecified: Secondary | ICD-10-CM

## 2016-05-17 DIAGNOSIS — E119 Type 2 diabetes mellitus without complications: Secondary | ICD-10-CM | POA: Diagnosis not present

## 2016-05-17 DIAGNOSIS — Z0000 Encounter for general adult medical examination without abnormal findings: Secondary | ICD-10-CM | POA: Diagnosis not present

## 2016-05-17 DIAGNOSIS — Z1239 Encounter for other screening for malignant neoplasm of breast: Secondary | ICD-10-CM

## 2016-05-17 DIAGNOSIS — R110 Nausea: Secondary | ICD-10-CM

## 2016-05-17 DIAGNOSIS — Z0100 Encounter for examination of eyes and vision without abnormal findings: Secondary | ICD-10-CM

## 2016-05-17 DIAGNOSIS — H8110 Benign paroxysmal vertigo, unspecified ear: Secondary | ICD-10-CM

## 2016-05-17 DIAGNOSIS — T7840XA Allergy, unspecified, initial encounter: Secondary | ICD-10-CM

## 2016-05-17 HISTORY — DX: Allergy, unspecified, initial encounter: T78.40XA

## 2016-05-17 MED ORDER — PITAVASTATIN CALCIUM 1 MG PO TABS: 1.0000 mg | ORAL_TABLET | ORAL | 1 refills | Status: DC

## 2016-05-17 NOTE — Progress Notes (Signed)
Pre visit review using our clinic review tool, if applicable. No additional management support is needed unless otherwise documented below in the visit note. 

## 2016-05-17 NOTE — Assessment & Plan Note (Signed)
Patient with flared symptoms recently can increase the Claritin to twice daily as needed. Then use nasal saline as needed for congestion and/or Flonase daily for allergy

## 2016-05-17 NOTE — Assessment & Plan Note (Signed)
>>  ASSESSMENT AND PLAN FOR MORBID OBESITY (HCC) WRITTEN ON 05/17/2016  2:59 PM BY BLYTH, STACEY A, MD  Encouraged DASH diet, decrease po intake and increase exercise as tolerated. Needs 7-8 hours of sleep nightly. Avoid trans fats, eat small, frequent meals every 4-5 hours with lean proteins, complex carbs and healthy fats. Minimize simple carbs, GMO foods.

## 2016-05-17 NOTE — Patient Instructions (Addendum)
Bring a copy of your advance directives to your next office visit. Keep up the good work with weight loss!!!  Cold brew coffee NOW probiotics 1 cap daily CoQ 1 tab po daily with cholesterol meds  Preventive Care 71 Years and Older, Female Preventive care refers to lifestyle choices and visits with your health care provider that can promote health and wellness. What does preventive care include?  A yearly physical exam. This is also called an annual well check.  Dental exams once or twice a year.  Routine eye exams. Ask your health care provider how often you should have your eyes checked.  Personal lifestyle choices, including:  Daily care of your teeth and gums.  Regular physical activity.  Eating a healthy diet.  Avoiding tobacco and drug use.  Limiting alcohol use.  Practicing safe sex.  Taking low-dose aspirin every day.  Taking vitamin and mineral supplements as recommended by your health care provider. What happens during an annual well check? The services and screenings done by your health care provider during your annual well check will depend on your age, overall health, lifestyle risk factors, and family history of disease. Counseling  Your health care provider may ask you questions about your:  Alcohol use.  Tobacco use.  Drug use.  Emotional well-being.  Home and relationship well-being.  Sexual activity.  Eating habits.  History of falls.  Memory and ability to understand (cognition).  Work and work Statistician.  Reproductive health. Screening  You may have the following tests or measurements:  Height, weight, and BMI.  Blood pressure.  Lipid and cholesterol levels. These may be checked every 5 years, or more frequently if you are over 21 years old.  Skin check.  Lung cancer screening. You may have this screening every year starting at age 71 if you have a 30-pack-year history of smoking and currently smoke or have quit within the  past 15 years.  Fecal occult blood test (FOBT) of the stool. You may have this test every year starting at age 71.  Flexible sigmoidoscopy or colonoscopy. You may have a sigmoidoscopy every 5 years or a colonoscopy every 10 years starting at age 71.  Hepatitis C blood test.  Hepatitis B blood test.  Sexually transmitted disease (STD) testing.  Diabetes screening. This is done by checking your blood sugar (glucose) after you have not eaten for a while (fasting). You may have this done every 1-3 years.  Bone density scan. This is done to screen for osteoporosis. You may have this done starting at age 71.  Mammogram. This may be done every 1-2 years. Talk to your health care provider about how often you should have regular mammograms. Talk with your health care provider about your test results, treatment options, and if necessary, the need for more tests. Vaccines  Your health care provider may recommend certain vaccines, such as:  Influenza vaccine. This is recommended every year.  Tetanus, diphtheria, and acellular pertussis (Tdap, Td) vaccine. You may need a Td booster every 10 years.  Varicella vaccine. You may need this if you have not been vaccinated.  Zoster vaccine. You may need this after age 71.  Measles, mumps, and rubella (MMR) vaccine. You may need at least one dose of MMR if you were born in 1957 or later. You may also need a second dose.  Pneumococcal 13-valent conjugate (PCV13) vaccine. One dose is recommended after age 71.  Pneumococcal polysaccharide (PPSV23) vaccine. One dose is recommended after age 71.  Meningococcal vaccine. You may need this if you have certain conditions.  Hepatitis A vaccine. You may need this if you have certain conditions or if you travel or work in places where you may be exposed to hepatitis A.  Hepatitis B vaccine. You may need this if you have certain conditions or if you travel or work in places where you may be exposed to hepatitis  B.  Haemophilus influenzae type b (Hib) vaccine. You may need this if you have certain conditions. Talk to your health care provider about which screenings and vaccines you need and how often you need them. This information is not intended to replace advice given to you by your health care provider. Make sure you discuss any questions you have with your health care provider. Document Released: 01/31/2015 Document Revised: 09/24/2015 Document Reviewed: 11/05/2014 Elsevier Interactive Patient Education  2017 [employer].

## 2016-05-17 NOTE — Progress Notes (Signed)
Patient ID: Brenda Marsh, female   DOB: 01/07/1946, 71 y.o.   MRN: [id number]   Subjective:  I acted as a Education administrator for [name], [place], [place]   Patient ID: Brenda Marsh, female    DOB: May 29, 1945, 71 y.o.   MRN: [id number]  Chief Complaint  Patient presents with  . Medicare Wellness    w/ RN   . Annual Exam    Non-fasting. No concerns. Please review lab work.  . Diabetes  . Hypertension    Diabetes  She presents for her follow-up diabetic visit. She has type 2 diabetes mellitus. Pertinent negatives for hypoglycemia include no headaches. Pertinent negatives for diabetes include no blurred vision and no chest pain. Risk factors for coronary artery disease include diabetes mellitus and hypertension.  Hypertension  This is a chronic problem. The problem is controlled. Pertinent negatives include no blurred vision, chest pain, headaches, malaise/fatigue, palpitations or shortness of breath.    Patient is in today for a Medicare Wellness Visit with the Health Coach, to be followed up with an annual examination with the Provider. Patient has no acute concerns noted at this time. Notes she had a flare in nausea and epigastric discomfort after eating some rich past with fettachini recently. These synptoms are coming and going. Denies CP/palp/SOB/HA/congestion/fevers or GU c/o. Taking meds as prescribed Patient Care Team: Mosie Lukes, MD as PCP - General (Family Medicine) Evans Lance, MD as Consulting Physician (Cardiology) Milus Banister, MD as Consulting Physician (Gastroenterology) Valentina Shaggy, MD as Consulting Physician (Allergy and Immunology) [name], MD as Consulting Physician (Oncology) Lelon Perla, MD as Consulting Physician (Cardiology) Melissa Montane, MD as Consulting Physician (Otolaryngology)   Past Medical History:  Diagnosis Date  . AICD (automatic cardioverter/defibrillator) present   . Allergic state 05/17/2016   Dr Ernst Bowler asthma  and allergy specialist.  . Anemia 07/16/2012  . Arthritis    left knee pain, MRI in 2008-tricompartmental  degenerate changes, mucoid degeneration of ACL, post horn meniscal tear and ant horn meniscal tear  . Breast cancer in female [facility]) 2000   left; S/P lumpectomy, radiation and chemotherapy  . CHF (congestive heart failure) ([place])   . Colon polyp    unclear pathology  . Diabetes mellitus type 2, diet-controlled ([place]) 03/11/2009   Qualifier: Diagnosis of  By: Wynona Luna  (boderline )  . Hyperlipidemia   . Hypertension   . LBBB (left bundle branch block)   . Low back pain    left L3-4 injected  . Lymphedema of left arm   . Nonischemic cardiomyopathy ([place])   . Presence of permanent cardiac pacemaker   . Preventative health care 05/18/2016  . Travel sickness 05/12/2015  . Unspecified constipation 04/22/2012    Past Surgical History:  Procedure Laterality Date  . APPENDECTOMY  2009   Dr. Lucia Gaskins  . BI-VENTRICULAR IMPLANTABLE CARDIOVERTER DEFIBRILLATOR N/A 11/16/2013   Procedure: BI-VENTRICULAR IMPLANTABLE CARDIOVERTER DEFIBRILLATOR  (CRT-D);  Surgeon: Evans Lance, MD;  Location: [facility] CATH LAB;  Service: Cardiovascular;  Laterality: N/A;  . BI-VENTRICULAR IMPLANTABLE CARDIOVERTER DEFIBRILLATOR  (CRT-D)  11/16/13   MDT CRTD implanted by Dr Lovena Le for NICM, CHF, and LBBB  . BREAST BIOPSY Left 2000  . BREAST LUMPECTOMY Left 2000  . CARDIAC CATHETERIZATION  ~ 2010   "no blockage"  . CARPAL TUNNEL RELEASE Bilateral 2007   Dr. Daylene Katayama  . KNEE ARTHROSCOPY Right   . TONSILLECTOMY    . TRIGGER FINGER RELEASE  Bilateral 2005-04-24  . VAGINAL HYSTERECTOMY     Paritial    Family History  Problem Relation Age of Onset  . Heart attack Mother 60  . Diabetes Sister   . Diabetes Brother   . Coronary artery disease Other     Female first degree relative <60  . Hyperlipidemia Other   . Hypertension Other   . Cancer Other     prostate, 1st degree relative <50  . Allergic rhinitis Neg Hx   .  Angioedema Neg Hx   . Asthma Neg Hx   . Eczema Neg Hx   . Immunodeficiency Neg Hx   . Urticaria Neg Hx   . Colon cancer Neg Hx   . Stomach cancer Neg Hx   . Rectal cancer Neg Hx   . Esophageal cancer Neg Hx   . Liver cancer Neg Hx     Social History   Social History  . Marital status: Widowed    Spouse name: N/A  . Number of children: 2  . Years of education: N/A   Occupational History  .  Disabled   Social History Main Topics  . Smoking status: Former Smoker    Packs/day: 0.10    Years: 2.00    Types: Cigarettes    Quit date: 01/18/1970  . Smokeless tobacco: Never Used     Comment: "smoked maybe 1 pack/month when I did smoke  . Alcohol use No  . Drug use: No  . Sexual activity: No   Other Topics Concern  . Not on file   Social History Narrative   Widow - husband died in Apr 25, 2003   Occupation - retired from working in Radiation protection practitioner   2 daughter - on lives in [place], other in [place]   Remote history of tobacco but none in 40 years   Alcohol use - no    Outpatient Medications Prior to Visit  Medication Sig Dispense Refill  . albuterol (PROAIR HFA) 108 (90 Base) MCG/ACT inhaler Inhale 2 puffs into the lungs every 4 (four) hours as needed for wheezing or shortness of breath.    . Cholecalciferol (VITAMIN D) 400 UNITS capsule Take 400 Units by mouth daily.    Marland Kitchen EPINEPHrine 0.3 mg/0.3 mL IJ SOAJ injection Use as directed for severe allergic reaction 2 Device 1  . furosemide (LASIX) 20 MG tablet TAKE 1 TABLET BY MOUTH DAILY 30 tablet 11  . meclizine (ANTIVERT) 12.5 MG tablet Take 1 tablet (12.5 mg total) by mouth 3 (three) times daily as needed for dizziness. 90 tablet 3  . Multiple Vitamin (MULTIVITAMIN) capsule Take 1 capsule by mouth at bedtime.     . pantoprazole (PROTONIX) 40 MG tablet Take 1 tablet (40 mg total) by mouth 2 (two) times daily. #[id number] (Patient taking differently: Take 40 mg by mouth daily. #[id number]) 60 tablet 3  . spironolactone (ALDACTONE) 25 MG  tablet TAKE 1/2 TABLET (12.5 MG TOTAL) BY MOUTH DAILY. 45 tablet 3  . metoprolol tartrate (LOPRESSOR) 25 MG tablet Take 1 tablet (25 mg total) by mouth 2 (two) times daily. 60 tablet 11   No facility-administered medications prior to visit.      Review of Systems  Constitutional: Negative for fever and malaise/fatigue.  HENT: Negative for congestion.   Eyes: Negative for blurred vision.  Respiratory: Negative for cough and shortness of breath.   Cardiovascular: Negative for chest pain, palpitations and leg swelling.  Gastrointestinal: Positive for abdominal pain and nausea. Negative for blood in stool, constipation, diarrhea,  melena and vomiting.  Musculoskeletal: Negative for back pain.  Skin: Negative for rash.  Neurological: Negative for loss of consciousness and headaches.       Objective:    Physical Exam  Constitutional: She is oriented to person, place, and time. She appears well-developed and well-nourished. No distress.  HENT:  Head: Normocephalic and atraumatic.  Eyes: Conjunctivae are normal.  Neck: Normal range of motion. No thyromegaly present.  Cardiovascular: Normal rate and regular rhythm.   Pulmonary/Chest: Effort normal and breath sounds normal. She has no wheezes.  Abdominal: Soft. Bowel sounds are normal. There is no tenderness.  Musculoskeletal: She exhibits no edema or deformity.  Neurological: She is alert and oriented to person, place, and time.  Skin: Skin is warm and dry. She is not diaphoretic.  Psychiatric: She has a normal mood and affect.    BP 133/73   Pulse 62   Temp 97.7 F (36.5 C) (Oral)   Resp 16   Ht 5\' 4"  (1.626 m)   Wt 202 lb 9.6 oz (91.9 kg)   SpO2 100%   BMI 34.78 kg/m  Wt Readings from Last 3 Encounters:  05/17/16 202 lb 9.6 oz (91.9 kg)  04/02/16 209 lb 6 oz (95 kg)  02/20/16 211 lb 3.2 oz (95.8 kg)   BP Readings from Last 3 Encounters:  05/17/16 133/73  04/02/16 120/64  02/20/16 128/66     Immunization History    Administered Date(s) Administered  . Td 09/06/2006    Health Maintenance  Topic Date Due  . FOOT EXAM  07/07/1955  . OPHTHALMOLOGY EXAM  07/07/1955  . COLONOSCOPY  10/14/2011  . PNA vac Low Risk Adult (1 of 2 - PCV13) 05/17/2017 (Originally 07/07/2010)  . INFLUENZA VACCINE  08/18/2016  . TETANUS/TDAP  09/05/2016  . HEMOGLOBIN A1C  11/09/2016  . URINE MICROALBUMIN  05/10/2017  . MAMMOGRAM  07/23/2017  . DEXA SCAN  Completed  . Hepatitis C Screening  Completed    Lab Results  Component Value Date   WBC 6.6 05/10/2016   HGB 13.7 05/10/2016   HCT 40.8 05/10/2016   PLT 282.0 05/10/2016   GLUCOSE 74 05/10/2016   CHOL 301 (H) 05/10/2016   TRIG 153.0 (H) 05/10/2016   HDL 51.60 05/10/2016   LDLCALC 219 (H) 05/10/2016   ALT 17 05/10/2016   AST 23 05/10/2016   NA 138 05/10/2016   K 3.9 05/10/2016   CL 103 05/10/2016   CREATININE 0.87 05/10/2016   BUN 12 05/10/2016   CO2 26 05/10/2016   TSH 0.66 05/10/2016   HGBA1C 6.1 05/10/2016   MICROALBUR <0.7 05/10/2016    Lab Results  Component Value Date   TSH 0.66 05/10/2016   Lab Results  Component Value Date   WBC 6.6 05/10/2016   HGB 13.7 05/10/2016   HCT 40.8 05/10/2016   MCV 91.8 05/10/2016   PLT 282.0 05/10/2016   Lab Results  Component Value Date   NA 138 05/10/2016   K 3.9 05/10/2016   CO2 26 05/10/2016   GLUCOSE 74 05/10/2016   BUN 12 05/10/2016   CREATININE 0.87 05/10/2016   BILITOT 0.4 05/10/2016   ALKPHOS 70 05/10/2016   AST 23 05/10/2016   ALT 17 05/10/2016   PROT 8.9 (H) 05/10/2016   ALBUMIN 4.7 05/10/2016   CALCIUM 10.5 05/10/2016   ANIONGAP 10 02/10/2016   GFR 82.58 05/10/2016   Lab Results  Component Value Date   CHOL 301 (H) 05/10/2016   Lab Results  Component Value  Date   HDL 51.60 05/10/2016   Lab Results  Component Value Date   LDLCALC 219 (H) 05/10/2016   Lab Results  Component Value Date   TRIG 153.0 (H) 05/10/2016   Lab Results  Component Value Date   CHOLHDL 6 05/10/2016    Lab Results  Component Value Date   HGBA1C 6.1 05/10/2016         Assessment & Plan:   Problem List Items Addressed This Visit    Essential hypertension (Chronic)    Well controlled, no changes to meds. Encouraged heart healthy diet such as the DASH diet and exercise as tolerated.       Relevant Medications   Pitavastatin Calcium (LIVALO) 1 MG TABS   Diabetes mellitus type 2, diet-controlled (HCC)    hgba1c acceptable, minimize simple carbs. Increase exercise as tolerated.       Relevant Medications   Pitavastatin Calcium (LIVALO) 1 MG TABS   Benign paroxysmal positional vertigo    Has flared in intensity, frequency and has had several episodes this week. Referred to neurology for further consideration      Relevant Orders   Ambulatory referral to Neurology   Nausea    With epigastric discomfort, recurrent and noted after eating Fettachini recently. Encouraged to avoid fatty foods and check abdominal Ultrasound.       Relevant Orders   US Abdomen Complete   Morbid obesity ([place])    Encouraged DASH diet, decrease po intake and increase exercise as tolerated. Needs 7-8 hours of sleep nightly. Avoid trans fats, eat small, frequent meals every 4-5 hours with lean proteins, complex carbs and healthy fats. Minimize simple carbs, GMO foods.      Allergic state    Patient with flared symptoms recently can increase the Claritin to twice daily as needed. Then use nasal saline as needed for congestion and/or Flonase daily for allergy      Preventative health care    Patient encouraged to maintain heart healthy diet, regular exercise, adequate sleep. Consider daily probiotics. Take medications as prescribed.      Skin mole    Sun damaged skin, will refer to dermatology for further consideration      Relevant Orders   Ambulatory referral to Dermatology    Other Visit Diagnoses    Encounter for Medicare annual wellness exam    -  Primary   Breast cancer screening        Relevant Orders   MM SCREENING BREAST TOMO BILATERAL   Asymptomatic postmenopausal state       Relevant Orders   DG Bone Density   Eye exam, routine       Relevant Orders   Ambulatory referral to Optometry      I am having Ms. Render start on Pitavastatin Calcium. I am also having her maintain her multivitamin, Vitamin D, meclizine, spironolactone, furosemide, metoprolol tartrate, albuterol, EPINEPHrine, and pantoprazole.  Meds ordered this encounter  Medications  . Pitavastatin Calcium (LIVALO) 1 MG TABS    Sig: Take 1 tablet (1 mg total) by mouth 2 (two) times a week.    Dispense:  20 tablet    Refill:  1    CMA served as scribe during this visit. History, Physical and Plan performed by medical provider. Documentation and orders reviewed and attested to.  [name], MD

## 2016-05-17 NOTE — Assessment & Plan Note (Signed)
Encouraged DASH diet, decrease po intake and increase exercise as tolerated. Needs 7-8 hours of sleep nightly. Avoid trans fats, eat small, frequent meals every 4-5 hours with lean proteins, complex carbs and healthy fats. Minimize simple carbs, GMO foods. 

## 2016-05-17 NOTE — Assessment & Plan Note (Signed)
Has flared in intensity, frequency and has had several episodes this week. Referred to neurology for further consideration

## 2016-05-18 ENCOUNTER — Encounter: Payer: Self-pay | Admitting: Family Medicine

## 2016-05-18 DIAGNOSIS — Z0000 Encounter for general adult medical examination without abnormal findings: Secondary | ICD-10-CM

## 2016-05-18 DIAGNOSIS — D229 Melanocytic nevi, unspecified: Secondary | ICD-10-CM | POA: Insufficient documentation

## 2016-05-18 HISTORY — DX: Encounter for general adult medical examination without abnormal findings: Z00.00

## 2016-05-18 NOTE — Assessment & Plan Note (Signed)
Patient encouraged to maintain heart healthy diet, regular exercise, adequate sleep. Consider daily probiotics. Take medications as prescribed 

## 2016-05-18 NOTE — Assessment & Plan Note (Signed)
Well controlled, no changes to meds. Encouraged heart healthy diet such as the DASH diet and exercise as tolerated.  °

## 2016-05-18 NOTE — Assessment & Plan Note (Signed)
With epigastric discomfort, recurrent and noted after eating Fettachini recently. Encouraged to avoid fatty foods and check abdominal Ultrasound.

## 2016-05-18 NOTE — Assessment & Plan Note (Signed)
Sun damaged skin, will refer to dermatology for further consideration

## 2016-05-18 NOTE — Assessment & Plan Note (Addendum)
hgba1c acceptable, minimize simple carbs. Increase exercise as tolerated.  

## 2016-05-21 ENCOUNTER — Ambulatory Visit: Payer: Medicare HMO | Admitting: Allergy & Immunology

## 2016-05-24 ENCOUNTER — Ambulatory Visit (HOSPITAL_BASED_OUTPATIENT_CLINIC_OR_DEPARTMENT_OTHER)
Admission: RE | Admit: 2016-05-24 | Discharge: 2016-05-24 | Disposition: A | Discharge status: Home / Self Care | Payer: Medicare HMO | Source: Ambulatory Visit | Attending: Family Medicine | Admitting: Family Medicine

## 2016-05-24 DIAGNOSIS — I700 Atherosclerosis of aorta: Secondary | ICD-10-CM | POA: Insufficient documentation

## 2016-05-24 DIAGNOSIS — Z780 Asymptomatic menopausal state: Secondary | ICD-10-CM | POA: Diagnosis not present

## 2016-05-24 DIAGNOSIS — R110 Nausea: Secondary | ICD-10-CM | POA: Insufficient documentation

## 2016-05-24 DIAGNOSIS — R1013 Epigastric pain: Secondary | ICD-10-CM | POA: Diagnosis not present

## 2016-05-24 DIAGNOSIS — M85851 Other specified disorders of bone density and structure, right thigh: Secondary | ICD-10-CM | POA: Diagnosis not present

## 2016-05-31 ENCOUNTER — Encounter: Payer: Self-pay | Admitting: Diagnostic Neuroimaging

## 2016-05-31 ENCOUNTER — Ambulatory Visit (INDEPENDENT_AMBULATORY_CARE_PROVIDER_SITE_OTHER): Payer: Medicare HMO | Admitting: Diagnostic Neuroimaging

## 2016-05-31 VITALS — BP 136/77 | HR 76 | Ht 64.0 in | Wt 204.6 lb

## 2016-05-31 DIAGNOSIS — H81399 Other peripheral vertigo, unspecified ear: Secondary | ICD-10-CM | POA: Diagnosis not present

## 2016-05-31 DIAGNOSIS — R269 Unspecified abnormalities of gait and mobility: Secondary | ICD-10-CM | POA: Diagnosis not present

## 2016-05-31 NOTE — Patient Instructions (Signed)
Thank you for coming to see [place] at [facility]. I hope we have been able to provide you high quality care today.  You may receive a patient satisfaction survey over the next few weeks. We would appreciate your feedback and comments so that we may continue to improve ourselves and the health of our patients.   - check CT head   - refer to vestibular physical therapy in [facility]   - use meclizine as needed for severe vertgo attacks; try not to use on daily basis    ~~~~~~~~~~~~~~~~~~~~~~~~~~~~~~~~~~~~~~~~~~~~~~~~~~~~~~~~~~~~~~~~~  DR. Remas Sobel'S GUIDE TO HAPPY AND HEALTHY LIVING These are some of my general health and wellness recommendations. Some of them may apply to you better than others. Please use common sense as you try these suggestions and feel free to ask me any questions.   ACTIVITY/FITNESS Mental, social, emotional and physical stimulation are very important for brain and body health. Try learning a new activity (arts, music, language, sports, games).  Keep moving your body to the best of your abilities. You can do this at home, inside or outside, the park, community center, gym or anywhere you like. Consider a physical therapist or personal trainer to get started. Consider the app Sworkit. Fitness trackers such as smart-watches, smart-phones or Fitbits can help as well.   NUTRITION Eat more plants: colorful vegetables, nuts, seeds and berries.  Eat less sugar, salt, preservatives and processed foods.  Avoid toxins such as cigarettes and alcohol.  Drink water when you are thirsty. Warm water with a slice of lemon is an excellent morning drink to start the day.  Consider these websites for more information The Nutrition Source (https://www.henry-hernandez.biz/) [employer] Nutrition (WindowBlog.ch)   RELAXATION Consider practicing mindfulness meditation or other relaxation techniques such as deep breathing,  prayer, yoga, tai chi, massage. See website mindful.org or the apps Headspace or Calm to help get started.   SLEEP Try to get at least 7-8+ hours sleep per day. Regular exercise and reduced caffeine will help you sleep better. Practice good sleep hygeine techniques. See website sleep.org for more information.   PLANNING Prepare estate planning, living will, healthcare POA documents. Sometimes this is best planned with the help of an attorney. Theconversationproject.org and agingwithdignity.org are excellent resources.

## 2016-05-31 NOTE — Progress Notes (Signed)
[facility]  PATIENT: Brenda Marsh DOB: 1945-09-09  REFERRING CLINICIAN: Frederik Pear HISTORY FROM: patient and daughter  REASON FOR VISIT: new consult    HISTORICAL  CHIEF COMPLAINT:  Chief Complaint  Patient presents with  . Vertigo    rm 7, New Pt, dgtr- [place], "in hospital x 2 in last 3 yrs for vertigo"    HISTORY OF PRESENT ILLNESS:   71 year old female here for evaluation of vertigo. Patient had first vertigo attack in 2015. Second attack was in November 2016. She describes severe room Spring sensation associated with nausea. Symptoms would last for up to 5-10 minutes at a time. Patient has had excessive workup in the past. Since that time she's had intermittent episodes at least once per week, with milder spinning vertigo sensation. She also has a daily low-level pressure sensation with headache. No ringing in ears. Symptoms slightly worse in the evening, but sometimes occur early in the morning.   REVIEW OF SYSTEMS: Full 14 system review of systems performed and negative with exception of: Allergies Raynaud's wheezing restless legs.  ALLERGIES: Allergies  Allergen Reactions  . Contrast Media [Iodinated Diagnostic Agents] Nausea And Vomiting    N&V  . Crestor [Rosuvastatin] Other (See Comments)    Myalgia, weakness  . Metrizamide Nausea And Vomiting  . Atorvastatin Other (See Comments)    Myalgia, weakness  . Lisinopril Other (See Comments)    REACTION: cough  . Naproxen Sodium Other (See Comments)    REACTION: Breathing difficulty  . Penicillins Other (See Comments)  . Shellfish Allergy Other (See Comments)    Cant breath   . Simvastatin Other (See Comments)    REACTION: muscle aches    HOME MEDICATIONS: Outpatient Medications Prior to Visit  Medication Sig Dispense Refill  . albuterol (PROAIR HFA) 108 (90 Base) MCG/ACT inhaler Inhale 2 puffs into the lungs every 4 (four) hours as needed for wheezing or shortness of breath.    .  Cholecalciferol (VITAMIN D) 400 UNITS capsule Take 400 Units by mouth daily.    Marland Kitchen EPINEPHrine 0.3 mg/0.3 mL IJ SOAJ injection Use as directed for severe allergic reaction 2 Device 1  . furosemide (LASIX) 20 MG tablet TAKE 1 TABLET BY MOUTH DAILY 30 tablet 11  . meclizine (ANTIVERT) 12.5 MG tablet Take 1 tablet (12.5 mg total) by mouth 3 (three) times daily as needed for dizziness. 90 tablet 3  . Multiple Vitamin (MULTIVITAMIN) capsule Take 1 capsule by mouth at bedtime.     . pantoprazole (PROTONIX) 40 MG tablet Take 1 tablet (40 mg total) by mouth 2 (two) times daily. #[id number] (Patient taking differently: Take 40 mg by mouth daily. #[id number]) 60 tablet 3  . Pitavastatin Calcium (LIVALO) 1 MG TABS Take 1 tablet (1 mg total) by mouth 2 (two) times a week. 20 tablet 1  . spironolactone (ALDACTONE) 25 MG tablet TAKE 1/2 TABLET (12.5 MG TOTAL) BY MOUTH DAILY. 45 tablet 3  . metoprolol tartrate (LOPRESSOR) 25 MG tablet Take 1 tablet (25 mg total) by mouth 2 (two) times daily. 60 tablet 11   No facility-administered medications prior to visit.     PAST MEDICAL HISTORY: Past Medical History:  Diagnosis Date  . AICD (automatic cardioverter/defibrillator) present   . Allergic state 05/17/2016   Dr Ernst Bowler asthma and allergy specialist.  . Anemia 07/16/2012  . Arthritis    left knee pain, MRI in 2008-tricompartmental  degenerate changes, mucoid degeneration of ACL, post horn meniscal tear and ant horn meniscal  tear  . Breast cancer in female [facility]) 18-Apr-1998   left; S/P lumpectomy, radiation and chemotherapy  . CHF (congestive heart failure) ([place])   . Colon polyp    unclear pathology  . Diabetes mellitus type 2, diet-controlled ([place]) 03/11/2009   Qualifier: Diagnosis of  By: Wynona Luna  (boderline )  . Hyperlipidemia   . Hypertension   . LBBB (left bundle branch block)   . Low back pain    left L3-4 injected  . Lymphedema of left arm   . Nonischemic cardiomyopathy ([place])   . Presence of  permanent cardiac pacemaker   . Preventative health care 05/18/2016  . Travel sickness 05/12/2015  . Unspecified constipation 04/22/2012    PAST SURGICAL HISTORY: Past Surgical History:  Procedure Laterality Date  . APPENDECTOMY  04/18/2007   Dr. Lucia Gaskins  . BI-VENTRICULAR IMPLANTABLE CARDIOVERTER DEFIBRILLATOR N/A 11/16/2013   Procedure: BI-VENTRICULAR IMPLANTABLE CARDIOVERTER DEFIBRILLATOR  (CRT-D);  Surgeon: Evans Lance, MD;  Location: [facility] CATH LAB;  Service: Cardiovascular;  Laterality: N/A;  . BI-VENTRICULAR IMPLANTABLE CARDIOVERTER DEFIBRILLATOR  (CRT-D)  11/16/13   MDT CRTD implanted by Dr Lovena Le for NICM, CHF, and LBBB  . BREAST BIOPSY Left 1998-04-18  . BREAST LUMPECTOMY Left April 18, 1998  . CARDIAC CATHETERIZATION  ~ 2008/04/17   "no blockage"  . CARPAL TUNNEL RELEASE Bilateral Apr 17, 2005   Dr. Daylene Katayama  . KNEE ARTHROSCOPY Right   . TONSILLECTOMY    . TRIGGER FINGER RELEASE Bilateral 04-17-05  . VAGINAL HYSTERECTOMY     Paritial    FAMILY HISTORY: Family History  Problem Relation Age of Onset  . Heart attack Mother 14  . Diabetes Sister   . Diabetes Brother   . Coronary artery disease Other        Female first degree relative <60  . Hyperlipidemia Other   . Hypertension Other   . Cancer Other        prostate, 1st degree relative <50  . Allergic rhinitis Neg Hx   . Angioedema Neg Hx   . Asthma Neg Hx   . Eczema Neg Hx   . Immunodeficiency Neg Hx   . Urticaria Neg Hx   . Colon cancer Neg Hx   . Stomach cancer Neg Hx   . Rectal cancer Neg Hx   . Esophageal cancer Neg Hx   . Liver cancer Neg Hx     SOCIAL HISTORY:  Social History   Social History  . Marital status: Widowed    Spouse name: N/A  . Number of children: 2  . Years of education: 14   Occupational History  .  Disabled   Social History Main Topics  . Smoking status: Former Smoker    Packs/day: 0.10    Years: 2.00    Types: Cigarettes    Quit date: 01/18/1970  . Smokeless tobacco: Never Used     Comment: "smoked maybe 1  pack/month when I did smoke  . Alcohol use No  . Drug use: No  . Sexual activity: No   Other Topics Concern  . Not on file   Social History Narrative   Widow - husband died in 04-18-2003   Occupation - retired from working in Radiation protection practitioner   2 daughter - on lives in [place], other in [place]   Remote history of tobacco but none in 40 years   Caffeine- coffee, 1 cup daily; 1 energy drink daily (V8)     PHYSICAL EXAM  GENERAL EXAM/CONSTITUTIONAL: Vitals:  Vitals:  05/31/16 0851  BP: 136/77  Pulse: 76  Weight: 204 lb 9.6 oz (92.8 kg)  Height: 5\' 4"  (1.626 m)   Body mass index is 35.12 kg/m.  Visual Acuity Screening   Right eye Left eye Both eyes  Without correction:     With correction: 20/30 20/30     Patient is in no distress; well developed, nourished and groomed; neck is supple  CARDIOVASCULAR:  Examination of carotid arteries is normal; no carotid bruits  Regular rate and rhythm, no murmurs  Examination of peripheral vascular system by observation and palpation is normal  EYES:  Ophthalmoscopic exam of optic discs and posterior segments is normal; no papilledema or hemorrhages  MUSCULOSKELETAL:  Gait, strength, tone, movements noted in Neurologic exam below  NEUROLOGIC: MENTAL STATUS:  MMSE - Mini Mental State Exam 05/17/2016  Orientation to time 5  Orientation to Place 5  Registration 3  Attention/ Calculation 5  Recall 2  Language- name 2 objects 2  Language- repeat 1  Language- follow 3 step command 3  Language- read & follow direction 1  Write a sentence 1  Copy design 1  Total score 29    awake, alert, oriented to person, place and time  recent and remote memory intact  normal attention and concentration  language fluent, comprehension intact, naming intact,   fund of knowledge appropriate  CRANIAL NERVE:   2nd - no papilledema on fundoscopic exam  2nd, 3rd, 4th, 6th - pupils equal and reactive to light, visual fields full to  confrontation, extraocular muscles intact, no nystagmus  5th - facial sensation symmetric  7th - facial strength symmetric  8th - hearing intact  9th - palate elevates symmetrically, uvula midline  11th - shoulder shrug symmetric  12th - tongue protrusion midline  MOTOR:   normal bulk and tone, full strength in the BUE, BLE  SENSORY:   normal and symmetric to light touch, temperature, vibration  COORDINATION:   finger-nose-finger, fine finger movements normal  REFLEXES:   deep tendon reflexes TRACE and symmetric  GAIT/STATION:   narrow based gait; able to walk tandem; romberg is negative    DIAGNOSTIC DATA (LABS, IMAGING, TESTING) - I reviewed patient records, labs, notes, testing and imaging myself where available.  Lab Results  Component Value Date   WBC 6.6 05/10/2016   HGB 13.7 05/10/2016   HCT 40.8 05/10/2016   MCV 91.8 05/10/2016   PLT 282.0 05/10/2016      Component Value Date/Time   NA 138 05/10/2016 0901   NA 132 09/29/2015 1537   K 3.9 05/10/2016 0901   K 3.8 09/29/2015 1537   CL 103 05/10/2016 0901   CL 104 09/29/2015 1537   CO2 26 05/10/2016 0901   CO2 25 09/29/2015 1537   GLUCOSE 74 05/10/2016 0901   GLUCOSE 73 09/29/2015 1537   BUN 12 05/10/2016 0901   BUN 6 (L) 09/29/2015 1537   CREATININE 0.87 05/10/2016 0901   CREATININE 0.87 01/30/2016 1446   CALCIUM 10.5 05/10/2016 0901   CALCIUM 9.6 09/29/2015 1537   PROT 8.9 (H) 05/10/2016 0901   PROT 7.7 09/29/2015 1537   ALBUMIN 4.7 05/10/2016 0901   ALBUMIN 3.9 09/29/2015 1537   AST 23 05/10/2016 0901   AST 24 09/29/2015 1537   ALT 17 05/10/2016 0901   ALT 28 09/29/2015 1537   ALKPHOS 70 05/10/2016 0901   ALKPHOS 78 09/29/2015 1537   BILITOT 0.4 05/10/2016 0901   BILITOT 0.70 09/29/2015 1537   GFRNONAA >60  02/10/2016 1730   GFRNONAA >89 07/27/2013 1407   GFRAA >60 02/10/2016 1730   GFRAA >89 07/27/2013 1407   Lab Results  Component Value Date   CHOL 301 (H) 05/10/2016   HDL  51.60 05/10/2016   LDLCALC 219 (H) 05/10/2016   TRIG 153.0 (H) 05/10/2016   CHOLHDL 6 05/10/2016   Lab Results  Component Value Date   HGBA1C 6.1 05/10/2016   Lab Results  Component Value Date   VITAMINB12 477 12/21/2012   Lab Results  Component Value Date   TSH 0.66 05/10/2016    06/26/12 MRI brain  - No acute abnormality and no change from 2005.  11/23/14 CT head  - normal     ASSESSMENT AND PLAN  71 y.o. year old female here with intermittent vertigo, sometimes positional triggered, sometimes sporadic, with associated nausea. 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 Will check CT scan to rule out central causes (patient has ICD and cannot have MRI).      Dx:  1. Peripheral positional vertigo, unspecified laterality   2. Gait difficulty      PLAN: - check CT head  - refer to vestibular PT in [place]  - use meclizine as needed for severe vertgo attacks; try not to use on daily basis  Orders Placed This Encounter  Procedures  . CT HEAD WO CONTRAST  . PT vestibular rehab   Return in about 3 months (around 08/31/2016).    Penni Bombard, MD 4/31/5400, 8:67 PM Certified in Neurology, Neurophysiology and Neuroimaging  [facility] [address], [place], [place] [address] [phone]

## 2016-06-02 ENCOUNTER — Ambulatory Visit (INDEPENDENT_AMBULATORY_CARE_PROVIDER_SITE_OTHER): Payer: Medicare HMO | Admitting: *Deleted

## 2016-06-02 ENCOUNTER — Telehealth: Payer: Self-pay | Admitting: Pediatrics

## 2016-06-02 DIAGNOSIS — I428 Other cardiomyopathies: Secondary | ICD-10-CM | POA: Diagnosis not present

## 2016-06-02 LAB — CUP PACEART REMOTE DEVICE CHECK
Battery Remaining Longevity: 35 mo
Battery Voltage: 2.95 V
Brady Statistic AS VS Percent: 1.48 %
Brady Statistic RV Percent Paced: 64.94 %
HIGH POWER IMPEDANCE MEASURED VALUE: 75 Ohm
Implantable Lead Implant Date: 20151030
Implantable Lead Location: 753859
Implantable Lead Model: 4598
Implantable Lead Model: 6935
Implantable Pulse Generator Implant Date: 20151030
Lead Channel Impedance Value: 1159 Ohm
Lead Channel Impedance Value: 380 Ohm
Lead Channel Impedance Value: 437 Ohm
Lead Channel Impedance Value: 437 Ohm
Lead Channel Impedance Value: 722 Ohm
Lead Channel Impedance Value: 722 Ohm
Lead Channel Impedance Value: 912 Ohm
Lead Channel Pacing Threshold Amplitude: 1.875 V
Lead Channel Sensing Intrinsic Amplitude: 13.75 mV
Lead Channel Sensing Intrinsic Amplitude: 13.75 mV
Lead Channel Sensing Intrinsic Amplitude: 3.5 mV
Lead Channel Setting Pacing Amplitude: 2.5 V
Lead Channel Setting Pacing Amplitude: 2.5 V
Lead Channel Setting Pacing Pulse Width: 0.4 ms
Lead Channel Setting Pacing Pulse Width: 1 ms
MDC IDC LEAD IMPLANT DT: 20151030
MDC IDC LEAD IMPLANT DT: 20151030
MDC IDC LEAD LOCATION: 753858
MDC IDC LEAD LOCATION: 753860
MDC IDC MSMT LEADCHNL LV IMPEDANCE VALUE: 1083 Ohm
MDC IDC MSMT LEADCHNL LV IMPEDANCE VALUE: 1178 Ohm
MDC IDC MSMT LEADCHNL LV IMPEDANCE VALUE: 570 Ohm
MDC IDC MSMT LEADCHNL LV IMPEDANCE VALUE: 589 Ohm
MDC IDC MSMT LEADCHNL LV IMPEDANCE VALUE: 950 Ohm
MDC IDC MSMT LEADCHNL LV PACING THRESHOLD PULSEWIDTH: 1 ms
MDC IDC MSMT LEADCHNL RA IMPEDANCE VALUE: 456 Ohm
MDC IDC MSMT LEADCHNL RA PACING THRESHOLD AMPLITUDE: 0.875 V
MDC IDC MSMT LEADCHNL RA PACING THRESHOLD PULSEWIDTH: 0.4 ms
MDC IDC MSMT LEADCHNL RA SENSING INTR AMPL: 3.5 mV
MDC IDC MSMT LEADCHNL RV PACING THRESHOLD AMPLITUDE: 0.75 V
MDC IDC MSMT LEADCHNL RV PACING THRESHOLD PULSEWIDTH: 0.4 ms
MDC IDC SESS DTM: 20180516062602
MDC IDC SET LEADCHNL RA PACING AMPLITUDE: 2 V
MDC IDC SET LEADCHNL RV SENSING SENSITIVITY: 0.3 mV
MDC IDC STAT BRADY AP VP PERCENT: 0.07 %
MDC IDC STAT BRADY AP VS PERCENT: 0.01 %
MDC IDC STAT BRADY AS VP PERCENT: 98.44 %
MDC IDC STAT BRADY RA PERCENT PACED: 0.09 %

## 2016-06-02 NOTE — Telephone Encounter (Signed)
Advised pt that we rec'd her pmt on 05-25-16 - kt

## 2016-06-02 NOTE — Telephone Encounter (Signed)
Patient said she mailed in cashiers check weeks ago and is still being billed. She would like to know if payment was received.

## 2016-06-02 NOTE — Progress Notes (Signed)
Remote ICD transmission.   

## 2016-06-03 ENCOUNTER — Encounter: Payer: Self-pay | Admitting: Cardiology

## 2016-06-07 ENCOUNTER — Telehealth: Payer: Self-pay | Admitting: Diagnostic Neuroimaging

## 2016-06-07 NOTE — Telephone Encounter (Signed)
[employer] did not approve the CT. I received a faxed on Friday stating it needs additional information. The phone number for the peer to peer is [phone] select option 4  and the case number is [id number]. The case does close on Wednesday 06/09/16.

## 2016-06-09 NOTE — Telephone Encounter (Signed)
I called to obtain approval for CT head. I was transferred to 3 different staff members. Finally they scheduled me for a peer-peer phone call for tomorrow at 11:15am.  -VRP

## 2016-06-09 NOTE — Telephone Encounter (Signed)
Just a remind about this peer to peer.. The case close's today

## 2016-06-10 NOTE — Telephone Encounter (Signed)
Noted, thank you

## 2016-06-10 NOTE — Telephone Encounter (Signed)
I called for approval.  Auth# [id number]  -VRP

## 2016-06-11 ENCOUNTER — Ambulatory Visit (HOSPITAL_BASED_OUTPATIENT_CLINIC_OR_DEPARTMENT_OTHER)
Admission: RE | Admit: 2016-06-11 | Discharge: 2016-06-11 | Disposition: A | Discharge status: Home / Self Care | Payer: Medicare HMO | Source: Ambulatory Visit | Attending: Diagnostic Neuroimaging | Admitting: Diagnostic Neuroimaging

## 2016-06-11 ENCOUNTER — Ambulatory Visit (AMBULATORY_SURGERY_CENTER): Payer: Self-pay

## 2016-06-11 ENCOUNTER — Encounter: Payer: Self-pay | Admitting: Gastroenterology

## 2016-06-11 VITALS — Ht 64.0 in | Wt 207.0 lb

## 2016-06-11 DIAGNOSIS — H81399 Other peripheral vertigo, unspecified ear: Secondary | ICD-10-CM | POA: Diagnosis not present

## 2016-06-11 DIAGNOSIS — R269 Unspecified abnormalities of gait and mobility: Secondary | ICD-10-CM | POA: Diagnosis not present

## 2016-06-11 DIAGNOSIS — R42 Dizziness and giddiness: Secondary | ICD-10-CM | POA: Diagnosis not present

## 2016-06-11 DIAGNOSIS — R110 Nausea: Secondary | ICD-10-CM

## 2016-06-11 NOTE — Progress Notes (Signed)
No allergies to eggs or soy No diet meds No home oxygen No past problems with anesthesia  Declined emmi  PPM and defib is on the right NOT left

## 2016-06-15 ENCOUNTER — Telehealth: Payer: Self-pay | Admitting: *Deleted

## 2016-06-15 NOTE — Telephone Encounter (Signed)
LVM informing patient her CT head results were unremarkable, no major findings. Advised she continue with vestibular rehab, take Meclizine as needed but try not to take daily. Reminded her of her FU in Aug. Left number for any questions.

## 2016-06-16 DIAGNOSIS — L918 Other hypertrophic disorders of the skin: Secondary | ICD-10-CM | POA: Diagnosis not present

## 2016-06-18 ENCOUNTER — Ambulatory Visit (AMBULATORY_SURGERY_CENTER): Payer: Medicare HMO | Admitting: Gastroenterology

## 2016-06-18 ENCOUNTER — Encounter: Payer: Self-pay | Admitting: Gastroenterology

## 2016-06-18 VITALS — BP 128/59 | HR 64 | Temp 97.5°F | Resp 16 | Ht 64.0 in | Wt 207.0 lb

## 2016-06-18 DIAGNOSIS — R110 Nausea: Secondary | ICD-10-CM

## 2016-06-18 DIAGNOSIS — R1310 Dysphagia, unspecified: Secondary | ICD-10-CM

## 2016-06-18 DIAGNOSIS — I10 Essential (primary) hypertension: Secondary | ICD-10-CM | POA: Diagnosis not present

## 2016-06-18 MED ORDER — SODIUM CHLORIDE 0.9 % IV SOLN: 500.0000 mL | INTRAVENOUS | Status: DC

## 2016-06-18 NOTE — Op Note (Signed)
[facility] Patient Name: Brenda Marsh Procedure Date: 06/18/2016 1:38 PM MRN: [id number] Endoscopist: Milus Banister , MD Age: 71 Referring MD:  Date of Birth: August 20, 1945 Gender: Female Account #: [id number] Procedure:                Upper GI endoscopy Indications:              Dysphagia Medicines:                Monitored Anesthesia Care Procedure:                Pre-Anesthesia Assessment:                           - Prior to the procedure, a History and Physical                            was performed, and patient medications and                            allergies were reviewed. The patient's tolerance of                            previous anesthesia was also reviewed. The risks                            and benefits of the procedure and the sedation                            options and risks were discussed with the patient.                            All questions were answered, and informed consent                            was obtained. Prior Anticoagulants: The patient has                            taken no previous anticoagulant or antiplatelet                            agents. ASA Grade Assessment: III - A patient with                            severe systemic disease. After reviewing the risks                            and benefits, the patient was deemed in                            satisfactory condition to undergo the procedure.                           After obtaining informed consent, the endoscope was  passed under direct vision. Throughout the                            procedure, the patient's blood pressure, pulse, and                            oxygen saturations were monitored continuously. The                            Endoscope was introduced through the mouth, and                            advanced to the second part of duodenum. The upper                            GI endoscopy was accomplished without  difficulty.                            The patient tolerated the procedure well. Scope In: Scope Out: Findings:                 The esophagus was normal.                           The stomach was normal.                           The examined duodenum was normal. Complications:            No immediate complications. Estimated blood loss:                            None. Estimated Blood Loss:     Estimated blood loss: none. Impression:               - Normal examination. Recommendation:           - Patient has a contact number available for                            emergencies. The signs and symptoms of potential                            delayed complications were discussed with the                            patient. Return to normal activities tomorrow.                            Written discharge instructions were provided to the                            patient.                           - Resume previous diet.                           -  Continue present medications.                           - No repeat upper endoscopy. Milus Banister, MD 06/18/2016 1:52:11 PM This report has been signed electronically.

## 2016-06-18 NOTE — Progress Notes (Signed)
Report to PACU, RN, vss, BBS= Clear.  

## 2016-06-18 NOTE — Patient Instructions (Signed)
YOU HAD AN ENDOSCOPIC PROCEDURE TODAY AT THE [facility]:   Refer to the procedure report that was given to you for any specific questions about what was found during the examination.  If the procedure report does not answer your questions, please call your gastroenterologist to clarify.  If you requested that your care partner not be given the details of your procedure findings, then the procedure report has been included in a sealed envelope for you to review at your convenience later.  YOU SHOULD EXPECT: Some feelings of bloating in the abdomen. Passage of more gas than usual.  Walking can help get rid of the air that was put into your GI tract during the procedure and reduce the bloating. If you had a lower endoscopy (such as a colonoscopy or flexible sigmoidoscopy) you may notice spotting of blood in your stool or on the toilet paper. If you underwent a bowel prep for your procedure, you may not have a normal bowel movement for a few days.  Please Note:  You might notice some irritation and congestion in your nose or some drainage.  This is from the oxygen used during your procedure.  There is no need for concern and it should clear up in a day or so.  SYMPTOMS TO REPORT IMMEDIATELY:    Following upper endoscopy (EGD)  Vomiting of blood or coffee ground material  New chest pain or pain under the shoulder blades  Painful or persistently difficult swallowing  New shortness of breath  Fever of 100F or higher  Black, tarry-looking stools  For urgent or emergent issues, a gastroenterologist can be reached at any hour by calling [phone].    DIET:  We do recommend a small meal at first, but then you may proceed to your regular diet.  Drink plenty of fluids but you should avoid alcoholic beverages for 24 hours.  ACTIVITY:  You should plan to take it easy for the rest of today and you should NOT DRIVE or use heavy machinery until tomorrow (because of the sedation medicines  used during the test).    FOLLOW UP: Our staff will call the number listed on your records the next business day following your procedure to check on you and address any questions or concerns that you may have regarding the information given to you following your procedure. If we do not reach you, we will leave a message.  However, if you are feeling well and you are not experiencing any problems, there is no need to return our call.  We will assume that you have returned to your regular daily activities without incident.  If any biopsies were taken you will be contacted by phone or by letter within the next 1-3 weeks.  Please call us at [phone] if you have not heard about the biopsies in 3 weeks.    SIGNATURES/CONFIDENTIALITY: You and/or your care partner have signed paperwork which will be entered into your electronic medical record.  These signatures attest to the fact that that the information above on your After Visit Summary has been reviewed and is understood.  Full responsibility of the confidentiality of this discharge information lies with you and/or your care-partner.  Thank you for letting us take care of your healthcare needs today. 

## 2016-06-18 NOTE — Progress Notes (Signed)
Pt's states no medical or surgical changes since previsit or office visit. 

## 2016-06-21 ENCOUNTER — Telehealth: Payer: Self-pay | Admitting: *Deleted

## 2016-06-21 NOTE — Telephone Encounter (Signed)
  Follow up Call-  Call back number 06/18/2016  Post procedure Call Back phone  # [phone]  Permission to leave phone message Yes  Some recent data might be hidden     Patient questions:  Do you have a fever, pain , or abdominal swelling? No. Pain Score  0 *  Have you tolerated food without any problems? Yes.    Have you been able to return to your normal activities? Yes.    Do you have any questions about your discharge instructions: Diet   No. Medications  No. Follow up visit  No.  Do you have questions or concerns about your Care? No.  Actions: * If pain score is 4 or above: No action needed, pain <4.

## 2016-08-12 ENCOUNTER — Ambulatory Visit (HOSPITAL_BASED_OUTPATIENT_CLINIC_OR_DEPARTMENT_OTHER): Payer: Medicare HMO

## 2016-08-13 ENCOUNTER — Encounter (HOSPITAL_BASED_OUTPATIENT_CLINIC_OR_DEPARTMENT_OTHER): Payer: Self-pay

## 2016-08-13 ENCOUNTER — Ambulatory Visit (HOSPITAL_BASED_OUTPATIENT_CLINIC_OR_DEPARTMENT_OTHER)
Admission: RE | Admit: 2016-08-13 | Discharge: 2016-08-13 | Disposition: A | Discharge status: Home / Self Care | Payer: Medicare HMO | Source: Ambulatory Visit | Attending: Family Medicine | Admitting: Family Medicine

## 2016-08-13 DIAGNOSIS — Z1231 Encounter for screening mammogram for malignant neoplasm of breast: Secondary | ICD-10-CM | POA: Diagnosis not present

## 2016-08-13 DIAGNOSIS — Z1239 Encounter for other screening for malignant neoplasm of breast: Secondary | ICD-10-CM

## 2016-09-01 ENCOUNTER — Ambulatory Visit (INDEPENDENT_AMBULATORY_CARE_PROVIDER_SITE_OTHER): Payer: Medicare HMO | Admitting: *Deleted

## 2016-09-01 DIAGNOSIS — I5022 Chronic systolic (congestive) heart failure: Secondary | ICD-10-CM | POA: Diagnosis not present

## 2016-09-01 DIAGNOSIS — I428 Other cardiomyopathies: Secondary | ICD-10-CM | POA: Diagnosis not present

## 2016-09-02 LAB — CUP PACEART REMOTE DEVICE CHECK
Battery Remaining Longevity: 29 mo
Brady Statistic AP VS Percent: 0.02 %
Brady Statistic AS VP Percent: 98.63 %
Brady Statistic RA Percent Paced: 0.06 %
Date Time Interrogation Session: 20180815052203
HighPow Impedance: 75 Ohm
Implantable Lead Implant Date: 20151030
Implantable Lead Implant Date: 20151030
Implantable Lead Location: 753859
Implantable Pulse Generator Implant Date: 20151030
Lead Channel Impedance Value: 1045 Ohm
Lead Channel Impedance Value: 1159 Ohm
Lead Channel Impedance Value: 1178 Ohm
Lead Channel Impedance Value: 437 Ohm
Lead Channel Impedance Value: 437 Ohm
Lead Channel Impedance Value: 589 Ohm
Lead Channel Impedance Value: 703 Ohm
Lead Channel Impedance Value: 893 Ohm
Lead Channel Pacing Threshold Amplitude: 0.875 V
Lead Channel Pacing Threshold Pulse Width: 0.4 ms
Lead Channel Pacing Threshold Pulse Width: 1 ms
Lead Channel Sensing Intrinsic Amplitude: 14.25 mV
Lead Channel Sensing Intrinsic Amplitude: 3.125 mV
Lead Channel Setting Pacing Amplitude: 2 V
Lead Channel Setting Pacing Pulse Width: 0.4 ms
Lead Channel Setting Pacing Pulse Width: 1 ms
Lead Channel Setting Sensing Sensitivity: 0.3 mV
MDC IDC LEAD IMPLANT DT: 20151030
MDC IDC LEAD LOCATION: 753858
MDC IDC LEAD LOCATION: 753860
MDC IDC MSMT BATTERY VOLTAGE: 2.94 V
MDC IDC MSMT LEADCHNL LV IMPEDANCE VALUE: 589 Ohm
MDC IDC MSMT LEADCHNL LV IMPEDANCE VALUE: 722 Ohm
MDC IDC MSMT LEADCHNL LV IMPEDANCE VALUE: 855 Ohm
MDC IDC MSMT LEADCHNL LV PACING THRESHOLD AMPLITUDE: 2.125 V
MDC IDC MSMT LEADCHNL RA IMPEDANCE VALUE: 494 Ohm
MDC IDC MSMT LEADCHNL RA SENSING INTR AMPL: 3.125 mV
MDC IDC MSMT LEADCHNL RV IMPEDANCE VALUE: 399 Ohm
MDC IDC MSMT LEADCHNL RV PACING THRESHOLD AMPLITUDE: 0.625 V
MDC IDC MSMT LEADCHNL RV PACING THRESHOLD PULSEWIDTH: 0.4 ms
MDC IDC MSMT LEADCHNL RV SENSING INTR AMPL: 14.25 mV
MDC IDC SET LEADCHNL LV PACING AMPLITUDE: 2.5 V
MDC IDC SET LEADCHNL RV PACING AMPLITUDE: 2.5 V
MDC IDC STAT BRADY AP VP PERCENT: 0.04 %
MDC IDC STAT BRADY AS VS PERCENT: 1.31 %
MDC IDC STAT BRADY RV PERCENT PACED: 73.08 %

## 2016-09-02 NOTE — Progress Notes (Signed)
Remote defibrillator check.  

## 2016-09-08 DIAGNOSIS — H5203 Hypermetropia, bilateral: Secondary | ICD-10-CM | POA: Diagnosis not present

## 2016-09-08 DIAGNOSIS — E119 Type 2 diabetes mellitus without complications: Secondary | ICD-10-CM | POA: Diagnosis not present

## 2016-09-08 DIAGNOSIS — H524 Presbyopia: Secondary | ICD-10-CM | POA: Diagnosis not present

## 2016-09-08 DIAGNOSIS — H2513 Age-related nuclear cataract, bilateral: Secondary | ICD-10-CM | POA: Diagnosis not present

## 2016-09-08 LAB — HM DIABETES EYE EXAM

## 2016-09-13 ENCOUNTER — Ambulatory Visit: Payer: Medicare HMO | Admitting: Diagnostic Neuroimaging

## 2016-09-14 ENCOUNTER — Encounter: Payer: Self-pay | Admitting: Cardiology

## 2016-09-24 ENCOUNTER — Encounter: Payer: Self-pay | Admitting: Family Medicine

## 2016-09-28 ENCOUNTER — Other Ambulatory Visit: Payer: Self-pay | Admitting: *Deleted

## 2016-09-28 ENCOUNTER — Other Ambulatory Visit: Payer: Self-pay

## 2016-09-28 DIAGNOSIS — Z853 Personal history of malignant neoplasm of breast: Secondary | ICD-10-CM

## 2016-09-28 MED ORDER — PANTOPRAZOLE SODIUM 40 MG PO TBEC: 40.0000 mg | DELAYED_RELEASE_TABLET | Freq: Two times a day (BID) | ORAL | 3 refills | Status: DC

## 2016-09-29 ENCOUNTER — Ambulatory Visit (HOSPITAL_BASED_OUTPATIENT_CLINIC_OR_DEPARTMENT_OTHER): Payer: Medicare HMO | Admitting: Hematology & Oncology

## 2016-09-29 ENCOUNTER — Other Ambulatory Visit (HOSPITAL_BASED_OUTPATIENT_CLINIC_OR_DEPARTMENT_OTHER): Payer: Medicare HMO

## 2016-09-29 VITALS — BP 116/54 | HR 86 | Temp 98.0°F | Resp 18 | Wt 204.1 lb

## 2016-09-29 DIAGNOSIS — I10 Essential (primary) hypertension: Secondary | ICD-10-CM | POA: Diagnosis not present

## 2016-09-29 DIAGNOSIS — I890 Lymphedema, not elsewhere classified: Secondary | ICD-10-CM

## 2016-09-29 DIAGNOSIS — Z853 Personal history of malignant neoplasm of breast: Secondary | ICD-10-CM | POA: Diagnosis not present

## 2016-09-29 DIAGNOSIS — E559 Vitamin D deficiency, unspecified: Secondary | ICD-10-CM

## 2016-09-29 DIAGNOSIS — Z950 Presence of cardiac pacemaker: Secondary | ICD-10-CM

## 2016-09-29 LAB — CMP (CANCER CENTER ONLY)
ALT(SGPT): 23 U/L (ref 10–47)
AST: 30 U/L (ref 11–38)
Albumin: 3.8 g/dL (ref 3.3–5.5)
Alkaline Phosphatase: 81 U/L (ref 26–84)
BUN, Bld: 12 mg/dL (ref 7–22)
CO2: 28 mEq/L (ref 18–33)
Calcium: 9.7 mg/dL (ref 8.0–10.3)
Chloride: 105 mEq/L (ref 98–108)
Creat: 0.9 mg/dl (ref 0.6–1.2)
Glucose, Bld: 84 mg/dL (ref 73–118)
Potassium: 3.8 mEq/L (ref 3.3–4.7)
Sodium: 143 mEq/L (ref 128–145)
Total Bilirubin: 0.5 mg/dl (ref 0.20–1.60)
Total Protein: 7.8 g/dL (ref 6.4–8.1)

## 2016-09-29 LAB — CBC WITH DIFFERENTIAL (CANCER CENTER ONLY)
BASO#: 0 10*3/uL (ref 0.0–0.2)
BASO%: 0.8 % (ref 0.0–2.0)
EOS%: 7.5 % — AB (ref 0.0–7.0)
Eosinophils Absolute: 0.4 10*3/uL (ref 0.0–0.5)
HEMATOCRIT: 37.5 % (ref 34.8–46.6)
HGB: 12.8 g/dL (ref 11.6–15.9)
LYMPH#: 2.4 10*3/uL (ref 0.9–3.3)
LYMPH%: 47.4 % (ref 14.0–48.0)
MCH: 31.6 pg (ref 26.0–34.0)
MCHC: 34.1 g/dL (ref 32.0–36.0)
MCV: 93 fL (ref 81–101)
MONO#: 0.3 10*3/uL (ref 0.1–0.9)
MONO%: 6.1 % (ref 0.0–13.0)
NEUT%: 38.2 % — ABNORMAL LOW (ref 39.6–80.0)
NEUTROS ABS: 1.9 10*3/uL (ref 1.5–6.5)
Platelets: 241 10*3/uL (ref 145–400)
RBC: 4.05 10*6/uL (ref 3.70–5.32)
RDW: 14.1 % (ref 11.1–15.7)
WBC: 5.1 10*3/uL (ref 3.9–10.0)

## 2016-09-29 NOTE — Progress Notes (Signed)
Hematology and Oncology Follow Up Visit  Brenda Marsh [id number] May 27, 1945 71 y.o. 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 She comes back for her yearly visit. She is doing quite well. She really has had no problems since we last saw her. Thankfully, her heart has been doing okay. She has a pacemaker in. She's had a pacemaker for 2 or 3 years.  She has a lymphedema in her left arm. She is doing okay with this. I am not sure if she wears any type of compression sleeve for her left arm.   She's had no problems with nausea or vomiting. She's had no cough. His been no shortness of breath. Per graph she's had no change in bowel or bladder habits.   Her last mammogram was back on 08/13/2016. This looked fine.   Overall, her performance status is ECOG 0.   Medications:  Allergies as of 09/29/2016      Reactions   Contrast Media [iodinated Diagnostic Agents] Nausea And Vomiting   N&V   Crestor [rosuvastatin] Other (See Comments)   Myalgia, weakness   Metrizamide Nausea And Vomiting   Atorvastatin Other (See Comments)   Myalgia, weakness   Lisinopril Other (See Comments)   REACTION: cough   Naproxen Sodium Other (See Comments)   REACTION: Breathing difficulty   Penicillins Other (See Comments)   Shellfish Allergy Other (See Comments)   Cant breath   Simvastatin Other (See Comments)   REACTION: muscle aches      Medication List       Accurate as of 09/29/16  5:00 PM. Always use your most recent med list.          EPINEPHrine 0.3 mg/0.3 mL Soaj injection Commonly known as:  EPI-PEN Use as directed for severe allergic reaction   furosemide 20 MG tablet Commonly known as:  LASIX TAKE 1 TABLET BY MOUTH DAILY   loratadine 10 MG tablet Commonly known as:  CLARITIN Take 10 mg by mouth daily.   meclizine 12.5  MG tablet Commonly known as:  ANTIVERT Take 1 tablet (12.5 mg total) by mouth 3 (three) times daily as needed for dizziness.   metoprolol tartrate 25 MG tablet Commonly known as:  LOPRESSOR Take 1 tablet (25 mg total) by mouth 2 (two) times daily.   multivitamin capsule Take 1 capsule by mouth at bedtime.   pantoprazole 40 MG tablet Commonly known as:  PROTONIX Take 1 tablet (40 mg total) by mouth 2 (two) times daily. #[id number]   PROAIR HFA 108 (90 Base) MCG/ACT inhaler Generic drug:  albuterol Inhale 2 puffs into the lungs every 4 (four) hours as needed for wheezing or shortness of breath.   ranitidine 75 MG tablet Commonly known as:  ZANTAC Take 75 mg by mouth 2 (two) times daily.   spironolactone 25 MG tablet Commonly known as:  ALDACTONE TAKE 1/2 TABLET (12.5 MG TOTAL) BY MOUTH DAILY.   Vitamin D 400 units capsule Take 400 Units by mouth daily.            Discharge Care Instructions        Start     Ordered   09/29/16 0000  CBC with Differential [facility])     09/29/16 1637   09/29/16 0000  CMP STAT ([facility] only)  09/29/16 1637   09/29/16 0000  VITAMIN D 25 Hydroxy (Vit-D Deficiency, Fractures)     09/29/16 1637      Allergies:   Past Medical History, Surgical history, Social history, and Family History were reviewed and updated.  Review of Systems: As stated in the interim history  Physical Exam:  weight is 204 lb 1.9 oz (92.6 kg). Her oral temperature is 98 F (36.7 C). Her blood pressure is 116/54 (abnormal) and her pulse is 86. Her respiration is 18 and oxygen saturation is 100%.   Wt Readings from Last 3 Encounters:  09/29/16 204 lb 1.9 oz (92.6 kg)  06/18/16 207 lb (93.9 kg)  06/11/16 207 lb (93.9 kg)    Obese African-American female in no obvious distress. Head and neck exam shows no ocular or oral lesions. She has no adenopathy in the neck. There is no palpable thyroid. Lungs are clear bilaterally. Cardiac exam  regular rate and rhythm with no murmurs, rubs or bruits. Breast exam shows right breast with no masses, edema or erythema. There is no right axillary adenopathy. Left breast is contracted from radiation and surgery. She has a well-healed lumpectomy scar at the 2:00 position. There is firmness at the lumpectomy scar. No distinct masses noted in the left breast. There is no left axillary adenopathy. Abdomen is soft. Abdomen is obese. She has good bowel sounds. There is no fluid wave. There is no palpable liver or spleen tip. Back exam shows no tenderness over the spine, ribs or hips. Externally shows no clubbing, cyanosis or edema. She does have the lymphedema in the left arm. Neurological exam shows no focal neurological deficits.  Lab Results  Component Value Date   WBC 5.1 09/29/2016   HGB 12.8 09/29/2016   HCT 37.5 09/29/2016   MCV 93 09/29/2016   PLT 241 09/29/2016   Lab Results  Component Value Date   IRON 56 12/21/2012   TIBC 429 12/21/2012   UIBC 373 12/21/2012   IRONPCTSAT 13 (L) 12/21/2012   Lab Results  Component Value Date   RBC 4.05 09/29/2016   No results found for: KPAFRELGTCHN, LAMBDASER, KAPLAMBRATIO No results found for: IGGSERUM, IGA, IGMSERUM No results found for: Ronnald Ramp, A1GS, A2GS, Violet Baldy, MSPIKE, SPEI   Chemistry      Component Value Date/Time   NA 143 09/29/2016 1500   K 3.8 09/29/2016 1500   CL 105 09/29/2016 1500   CO2 28 09/29/2016 1500   BUN 12 09/29/2016 1500   CREATININE 0.9 09/29/2016 1500      Component Value Date/Time   CALCIUM 9.7 09/29/2016 1500   ALKPHOS 81 09/29/2016 1500   AST 30 09/29/2016 1500   ALT 23 09/29/2016 1500   BILITOT 0.50 09/29/2016 1500     Impression and Plan: Brenda Marsh is a 71 year old African-American female with stage IIA ductal carcinoma the left breast. She had 1 lymph node positive. Her tumor was ER positive. She was on tamoxifen.  She is approximate 16 years out from  treatment.  We will continue to follow her along yearly.  As always, it is fine to see her. She is the grandmother of our former Midwife.   I still think that her mortality will be based upon her cardiac issues.   [name], MD 9/12/20185:00 PM

## 2016-09-30 ENCOUNTER — Telehealth: Payer: Self-pay | Admitting: *Deleted

## 2016-09-30 LAB — LACTATE DEHYDROGENASE: LDH: 389 U/L — ABNORMAL HIGH (ref 125–245)

## 2016-09-30 LAB — VITAMIN D 25 HYDROXY (VIT D DEFICIENCY, FRACTURES): Vitamin D, 25-Hydroxy: 33.9 ng/mL (ref 30.0–100.0)

## 2016-09-30 NOTE — Telephone Encounter (Addendum)
Patient is aware of results. She is also aware of recommendation and will increase her vitamin D intake  ----- Message from [name], MD sent at 09/30/2016  6:29 AM EDT ----- Call - the vit D is a little low for me.  She needs to be taking 2000 units a day!!!  pete

## 2016-10-13 ENCOUNTER — Other Ambulatory Visit: Payer: Self-pay | Admitting: *Deleted

## 2016-10-13 MED ORDER — MECLIZINE HCL 12.5 MG PO TABS: 12.5000 mg | ORAL_TABLET | Freq: Three times a day (TID) | ORAL | 3 refills | Status: DC | PRN

## 2016-10-17 ENCOUNTER — Other Ambulatory Visit: Payer: Self-pay | Admitting: Cardiology

## 2016-11-23 DIAGNOSIS — Z0100 Encounter for examination of eyes and vision without abnormal findings: Secondary | ICD-10-CM | POA: Diagnosis not present

## 2016-11-30 ENCOUNTER — Encounter: Payer: Self-pay | Admitting: Gastroenterology

## 2016-12-01 ENCOUNTER — Ambulatory Visit (INDEPENDENT_AMBULATORY_CARE_PROVIDER_SITE_OTHER): Payer: Medicare HMO | Admitting: *Deleted

## 2016-12-01 DIAGNOSIS — I428 Other cardiomyopathies: Secondary | ICD-10-CM

## 2016-12-01 NOTE — Progress Notes (Signed)
Remote ICD transmission.   

## 2016-12-02 LAB — CUP PACEART REMOTE DEVICE CHECK
Brady Statistic AP VP Percent: 0.04 %
Brady Statistic AP VS Percent: 0.01 %
Brady Statistic AS VP Percent: 98.53 %
Brady Statistic RA Percent Paced: 0.05 %
Brady Statistic RV Percent Paced: 72.63 %
HighPow Impedance: 70 Ohm
Implantable Lead Location: 753859
Implantable Lead Location: 753860
Implantable Lead Model: 5076
Lead Channel Impedance Value: 399 Ohm
Lead Channel Impedance Value: 456 Ohm
Lead Channel Impedance Value: 513 Ohm
Lead Channel Impedance Value: 627 Ohm
Lead Channel Impedance Value: 779 Ohm
Lead Channel Pacing Threshold Pulse Width: 0.4 ms
Lead Channel Pacing Threshold Pulse Width: 0.4 ms
Lead Channel Sensing Intrinsic Amplitude: 12.25 mV
Lead Channel Sensing Intrinsic Amplitude: 12.25 mV
Lead Channel Sensing Intrinsic Amplitude: 3.25 mV
Lead Channel Setting Pacing Amplitude: 2 V
Lead Channel Setting Pacing Amplitude: 2.5 V
Lead Channel Setting Pacing Pulse Width: 1 ms
MDC IDC LEAD IMPLANT DT: 20151030
MDC IDC LEAD IMPLANT DT: 20151030
MDC IDC LEAD IMPLANT DT: 20151030
MDC IDC LEAD LOCATION: 753858
MDC IDC MSMT BATTERY REMAINING LONGEVITY: 26 mo
MDC IDC MSMT BATTERY VOLTAGE: 2.94 V
MDC IDC MSMT LEADCHNL LV IMPEDANCE VALUE: 1026 Ohm
MDC IDC MSMT LEADCHNL LV IMPEDANCE VALUE: 1045 Ohm
MDC IDC MSMT LEADCHNL LV IMPEDANCE VALUE: 494 Ohm
MDC IDC MSMT LEADCHNL LV IMPEDANCE VALUE: 646 Ohm
MDC IDC MSMT LEADCHNL LV IMPEDANCE VALUE: 779 Ohm
MDC IDC MSMT LEADCHNL LV IMPEDANCE VALUE: 912 Ohm
MDC IDC MSMT LEADCHNL LV PACING THRESHOLD AMPLITUDE: 1.875 V
MDC IDC MSMT LEADCHNL LV PACING THRESHOLD PULSEWIDTH: 1 ms
MDC IDC MSMT LEADCHNL RA PACING THRESHOLD AMPLITUDE: 0.625 V
MDC IDC MSMT LEADCHNL RA SENSING INTR AMPL: 3.25 mV
MDC IDC MSMT LEADCHNL RV IMPEDANCE VALUE: 342 Ohm
MDC IDC MSMT LEADCHNL RV IMPEDANCE VALUE: 437 Ohm
MDC IDC MSMT LEADCHNL RV PACING THRESHOLD AMPLITUDE: 0.625 V
MDC IDC PG IMPLANT DT: 20151030
MDC IDC SESS DTM: 20181114093824
MDC IDC SET LEADCHNL RV PACING AMPLITUDE: 2.5 V
MDC IDC SET LEADCHNL RV PACING PULSEWIDTH: 0.4 ms
MDC IDC SET LEADCHNL RV SENSING SENSITIVITY: 0.3 mV
MDC IDC STAT BRADY AS VS PERCENT: 1.42 %

## 2016-12-03 ENCOUNTER — Encounter: Payer: Self-pay | Admitting: Cardiology

## 2016-12-17 ENCOUNTER — Other Ambulatory Visit: Payer: Self-pay | Admitting: Cardiology

## 2016-12-29 ENCOUNTER — Encounter: Payer: Medicare HMO | Admitting: Internal Medicine

## 2016-12-31 ENCOUNTER — Other Ambulatory Visit: Payer: Self-pay

## 2016-12-31 ENCOUNTER — Ambulatory Visit (AMBULATORY_SURGERY_CENTER): Payer: Self-pay | Admitting: *Deleted

## 2016-12-31 ENCOUNTER — Encounter: Payer: Self-pay | Admitting: Internal Medicine

## 2016-12-31 VITALS — Ht 65.0 in | Wt 213.0 lb

## 2016-12-31 DIAGNOSIS — Z86010 Personal history of colonic polyps: Secondary | ICD-10-CM

## 2016-12-31 DIAGNOSIS — Z1211 Encounter for screening for malignant neoplasm of colon: Secondary | ICD-10-CM

## 2016-12-31 MED ORDER — NA SULFATE-K SULFATE-MG SULF 17.5-3.13-1.6 GM/177ML PO SOLN: 1.0000 [IU] | Freq: Once | ORAL | 0 refills | Status: AC

## 2016-12-31 NOTE — Progress Notes (Signed)
No egg or soy allergy known to patient  No issues with past sedation with any surgeries  or procedures, no intubation problems  No diet pills per patient No home 02 use per patient  No blood thinners per patient  Pt denies issues with constipation  A fib pt. Has ICD and Pacemaker  EMMI video sent to pt's e mail pt. declined

## 2017-01-03 ENCOUNTER — Encounter: Payer: Self-pay | Admitting: Gastroenterology

## 2017-01-03 ENCOUNTER — Other Ambulatory Visit: Payer: Self-pay | Admitting: *Deleted

## 2017-01-03 MED ORDER — MECLIZINE HCL 12.5 MG PO TABS: 12.5000 mg | ORAL_TABLET | Freq: Three times a day (TID) | ORAL | 3 refills | Status: DC | PRN

## 2017-01-14 ENCOUNTER — Encounter: Payer: Self-pay | Admitting: Gastroenterology

## 2017-01-14 ENCOUNTER — Ambulatory Visit (AMBULATORY_SURGERY_CENTER): Payer: Medicare HMO | Admitting: Gastroenterology

## 2017-01-14 VITALS — BP 105/54 | HR 64 | Temp 97.7°F | Resp 13 | Ht 65.0 in | Wt 213.0 lb

## 2017-01-14 DIAGNOSIS — D124 Benign neoplasm of descending colon: Secondary | ICD-10-CM

## 2017-01-14 DIAGNOSIS — D128 Benign neoplasm of rectum: Secondary | ICD-10-CM | POA: Diagnosis not present

## 2017-01-14 DIAGNOSIS — D122 Benign neoplasm of ascending colon: Secondary | ICD-10-CM

## 2017-01-14 DIAGNOSIS — K6389 Other specified diseases of intestine: Secondary | ICD-10-CM | POA: Diagnosis not present

## 2017-01-14 DIAGNOSIS — D126 Benign neoplasm of colon, unspecified: Secondary | ICD-10-CM

## 2017-01-14 DIAGNOSIS — Z86010 Personal history of colonic polyps: Secondary | ICD-10-CM

## 2017-01-14 DIAGNOSIS — K5730 Diverticulosis of large intestine without perforation or abscess without bleeding: Secondary | ICD-10-CM

## 2017-01-14 DIAGNOSIS — Z1211 Encounter for screening for malignant neoplasm of colon: Secondary | ICD-10-CM | POA: Diagnosis not present

## 2017-01-14 MED ORDER — SODIUM CHLORIDE 0.9 % IV SOLN: 500.0000 mL | INTRAVENOUS | Status: DC

## 2017-01-14 NOTE — Op Note (Signed)
[facility] Patient Name: Brenda Marsh Procedure Date: 01/14/2017 1:33 PM MRN: [id number] Endoscopist: Milus Banister , MD Age: 71 Referring MD:  Date of Birth: 01-05-1946 Gender: Female Account #: [id number] Procedure:                Colonoscopy Indications:              High risk colon cancer surveillance: Personal                            history of colonic polyps; colonoscopy 2013 [place]                            [facility]; single subCM polyp with "a small focus of                            adenomatous change" Medicines:                Monitored Anesthesia Care Procedure:                Pre-Anesthesia Assessment:                           - Prior to the procedure, a History and Physical                            was performed, and patient medications and                            allergies were reviewed. The patient's tolerance of                            previous anesthesia was also reviewed. The risks                            and benefits of the procedure and the sedation                            options and risks were discussed with the patient.                            All questions were answered, and informed consent                            was obtained. Prior Anticoagulants: The patient has                            taken no previous anticoagulant or antiplatelet                            agents. ASA Grade Assessment: II - A patient with                            mild systemic disease. After reviewing the risks  and benefits, the patient was deemed in                            satisfactory condition to undergo the procedure.                           After obtaining informed consent, the colonoscope                            was passed under direct vision. Throughout the                            procedure, the patient's blood pressure, pulse, and                            oxygen saturations were monitored  continuously. The                            Colonoscope was introduced through the anus and                            advanced to the the cecum, identified by                            appendiceal orifice and ileocecal valve. The                            colonoscopy was performed without difficulty. The                            patient tolerated the procedure well. The quality                            of the bowel preparation was good. The ileocecal                            valve, appendiceal orifice, and rectum were                            photographed. Scope In: 1:35:25 PM Scope Out: 1:47:23 PM Scope Withdrawal Time: 0 hours 9 minutes 53 seconds  Total Procedure Duration: 0 hours 11 minutes 58 seconds  Findings:                 Three sessile polyps were found in the rectum,                            descending colon and ascending colon. The polyps                            were 2 to 4 mm in size. These polyps were removed                            with a cold snare. Resection and retrieval were  complete.                           Diverticulosis in the left colon.                           The exam was otherwise without abnormality on                            direct and retroflexion views. Complications:            No immediate complications. Estimated blood loss:                            None. Estimated Blood Loss:     Estimated blood loss: none. Impression:               - Three 2 to 4 mm polyps in the rectum, in the                            descending colon and in the ascending colon,                            removed with a cold snare. Resected and retrieved.                           - Diverticulosis                           - The examination was otherwise normal on direct                            and retroflexion views. Recommendation:           - Patient has a contact number available for                             emergencies. The signs and symptoms of potential                            delayed complications were discussed with the                            patient. Return to normal activities tomorrow.                            Written discharge instructions were provided to the                            patient.                           - Resume previous diet.                           - Continue present medications.  You will receive a letter within 2-3 weeks with the                            pathology results and my final recommendations.                           If the polyp(s) is proven to be 'pre-cancerous' on                            pathology, you will need repeat colonoscopy in 3-5                            years. If the polyp(s) is NOT 'precancerous' on                            pathology then you should repeat colon cancer                            screening in 10 years with colonoscopy without need                            for colon cancer screening by any method prior to                            then (including stool testing). Milus Banister, MD 01/14/2017 1:50:38 PM This report has been signed electronically.

## 2017-01-14 NOTE — Progress Notes (Signed)
Called to room to assist during endoscopic procedure.  Patient ID and intended procedure confirmed with present staff. Received instructions for my participation in the procedure from the performing physician.  

## 2017-01-14 NOTE — Patient Instructions (Signed)
Discharge instructions given. Handouts on polyps and diverticulosis. Resume previous medications. YOU HAD AN ENDOSCOPIC PROCEDURE TODAY AT THE [facility]:   Refer to the procedure report that was given to you for any specific questions about what was found during the examination.  If the procedure report does not answer your questions, please call your gastroenterologist to clarify.  If you requested that your care partner not be given the details of your procedure findings, then the procedure report has been included in a sealed envelope for you to review at your convenience later.  YOU SHOULD EXPECT: Some feelings of bloating in the abdomen. Passage of more gas than usual.  Walking can help get rid of the air that was put into your GI tract during the procedure and reduce the bloating. If you had a lower endoscopy (such as a colonoscopy or flexible sigmoidoscopy) you may notice spotting of blood in your stool or on the toilet paper. If you underwent a bowel prep for your procedure, you may not have a normal bowel movement for a few days.  Please Note:  You might notice some irritation and congestion in your nose or some drainage.  This is from the oxygen used during your procedure.  There is no need for concern and it should clear up in a day or so.  SYMPTOMS TO REPORT IMMEDIATELY:   Following lower endoscopy (colonoscopy or flexible sigmoidoscopy):  Excessive amounts of blood in the stool  Significant tenderness or worsening of abdominal pains  Swelling of the abdomen that is new, acute  Fever of 100F or higher   For urgent or emergent issues, a gastroenterologist can be reached at any hour by calling [phone].   DIET:  We do recommend a small meal at first, but then you may proceed to your regular diet.  Drink plenty of fluids but you should avoid alcoholic beverages for 24 hours.  ACTIVITY:  You should plan to take it easy for the rest of today and you should NOT  DRIVE or use heavy machinery until tomorrow (because of the sedation medicines used during the test).    FOLLOW UP: Our staff will call the number listed on your records the next business day following your procedure to check on you and address any questions or concerns that you may have regarding the information given to you following your procedure. If we do not reach you, we will leave a message.  However, if you are feeling well and you are not experiencing any problems, there is no need to return our call.  We will assume that you have returned to your regular daily activities without incident.  If any biopsies were taken you will be contacted by phone or by letter within the next 1-3 weeks.  Please call us at [phone] if you have not heard about the biopsies in 3 weeks.    SIGNATURES/CONFIDENTIALITY: You and/or your care partner have signed paperwork which will be entered into your electronic medical record.  These signatures attest to the fact that that the information above on your After Visit Summary has been reviewed and is understood.  Full responsibility of the confidentiality of this discharge information lies with you and/or your care-partner. 

## 2017-01-17 ENCOUNTER — Telehealth: Payer: Self-pay

## 2017-01-17 NOTE — Telephone Encounter (Signed)
  Follow up Call-  Call back number 01/14/2017 06/18/2016  Post procedure Call Back phone  # [phone]  Permission to leave phone message Yes Yes  Some recent data might be hidden     Patient questions:  Do you have a fever, pain , or abdominal swelling? No. Pain Score  0 *  Have you tolerated food without any problems? Yes.    Have you been able to return to your normal activities? Yes.    Do you have any questions about your discharge instructions: Diet   No. Medications  No. Follow up visit  No.  Do you have questions or concerns about your Care? No.  Actions: * If pain score is 4 or above: No action needed, pain <4.

## 2017-01-24 ENCOUNTER — Encounter: Payer: Self-pay | Admitting: Gastroenterology

## 2017-02-17 ENCOUNTER — Telehealth: Payer: Self-pay | Admitting: Family Medicine

## 2017-02-17 ENCOUNTER — Other Ambulatory Visit: Payer: Self-pay | Admitting: Family Medicine

## 2017-02-17 DIAGNOSIS — M549 Dorsalgia, unspecified: Secondary | ICD-10-CM

## 2017-02-17 NOTE — Telephone Encounter (Signed)
Copied from [facility] [phone]. Topic: General - Other >> Feb 17, 2017  9:08 AM Lolita Rieger, RMA wrote: Reason for CRM: pt called and would like a referral placed for physical therapy due to neck pain Please call pt if she needs to make an appointment or if she can just have an referral placed @[phone] 

## 2017-02-17 NOTE — Telephone Encounter (Signed)
done

## 2017-02-17 NOTE — Telephone Encounter (Signed)
Please advise 

## 2017-02-21 ENCOUNTER — Telehealth: Payer: Self-pay | Admitting: Family Medicine

## 2017-02-21 NOTE — Telephone Encounter (Signed)
Please advise 

## 2017-02-21 NOTE — Telephone Encounter (Signed)
Copied from [facility]. Topic: Referral - Status >> Feb 21, 2017  1:37 PM Cecelia Byars, NT wrote: Reason for CRM: Patient called to check  on status of request for Physical Therapy,she does not want to go to [facility] ,she wants to go to [facility] , the [address] in [place] [address] is the fax , call back number is  [phone]

## 2017-02-22 DIAGNOSIS — M542 Cervicalgia: Secondary | ICD-10-CM | POA: Diagnosis not present

## 2017-02-22 DIAGNOSIS — M549 Dorsalgia, unspecified: Secondary | ICD-10-CM | POA: Diagnosis not present

## 2017-03-02 ENCOUNTER — Ambulatory Visit (INDEPENDENT_AMBULATORY_CARE_PROVIDER_SITE_OTHER): Payer: Medicare HMO | Admitting: *Deleted

## 2017-03-02 DIAGNOSIS — M549 Dorsalgia, unspecified: Secondary | ICD-10-CM | POA: Diagnosis not present

## 2017-03-02 DIAGNOSIS — I428 Other cardiomyopathies: Secondary | ICD-10-CM

## 2017-03-02 DIAGNOSIS — M542 Cervicalgia: Secondary | ICD-10-CM | POA: Diagnosis not present

## 2017-03-02 NOTE — Progress Notes (Signed)
Remote ICD transmission.   

## 2017-03-03 ENCOUNTER — Encounter: Payer: Self-pay | Admitting: Cardiology

## 2017-03-09 DIAGNOSIS — M549 Dorsalgia, unspecified: Secondary | ICD-10-CM | POA: Diagnosis not present

## 2017-03-09 DIAGNOSIS — M542 Cervicalgia: Secondary | ICD-10-CM | POA: Diagnosis not present

## 2017-03-11 ENCOUNTER — Ambulatory Visit: Payer: Medicare HMO | Admitting: Internal Medicine

## 2017-03-11 ENCOUNTER — Encounter: Payer: Self-pay | Admitting: Internal Medicine

## 2017-03-11 VITALS — BP 120/70 | HR 74 | Ht 65.0 in | Wt 207.0 lb

## 2017-03-11 DIAGNOSIS — I5022 Chronic systolic (congestive) heart failure: Secondary | ICD-10-CM | POA: Diagnosis not present

## 2017-03-11 DIAGNOSIS — I447 Left bundle-branch block, unspecified: Secondary | ICD-10-CM

## 2017-03-11 DIAGNOSIS — I10 Essential (primary) hypertension: Secondary | ICD-10-CM

## 2017-03-11 DIAGNOSIS — Z95810 Presence of automatic (implantable) cardiac defibrillator: Secondary | ICD-10-CM

## 2017-03-11 LAB — CUP PACEART INCLINIC DEVICE CHECK
Brady Statistic AP VP Percent: 0.05 %
Brady Statistic AP VS Percent: 0.01 %
Brady Statistic AS VP Percent: 98.54 %
Brady Statistic AS VS Percent: 1.4 %
Brady Statistic RA Percent Paced: 0.06 %
Brady Statistic RV Percent Paced: 70.19 %
HighPow Impedance: 76 Ohm
Implantable Lead Implant Date: 20151030
Implantable Lead Implant Date: 20151030
Implantable Lead Location: 753858
Implantable Lead Location: 753860
Implantable Lead Model: 5076
Implantable Lead Model: 6935
Lead Channel Impedance Value: 1159 Ohm
Lead Channel Impedance Value: 456 Ohm
Lead Channel Impedance Value: 456 Ohm
Lead Channel Impedance Value: 494 Ohm
Lead Channel Impedance Value: 589 Ohm
Lead Channel Impedance Value: 589 Ohm
Lead Channel Impedance Value: 703 Ohm
Lead Channel Impedance Value: 722 Ohm
Lead Channel Impedance Value: 893 Ohm
Lead Channel Pacing Threshold Amplitude: 1.625 V
Lead Channel Pacing Threshold Pulse Width: 0.4 ms
Lead Channel Sensing Intrinsic Amplitude: 14.5 mV
Lead Channel Sensing Intrinsic Amplitude: 14.875 mV
Lead Channel Sensing Intrinsic Amplitude: 3.875 mV
Lead Channel Sensing Intrinsic Amplitude: 4.625 mV
Lead Channel Setting Pacing Amplitude: 2 V
Lead Channel Setting Pacing Amplitude: 2.5 V
Lead Channel Setting Pacing Pulse Width: 0.4 ms
Lead Channel Setting Pacing Pulse Width: 1 ms
MDC IDC LEAD IMPLANT DT: 20151030
MDC IDC LEAD LOCATION: 753859
MDC IDC MSMT BATTERY REMAINING LONGEVITY: 24 mo
MDC IDC MSMT BATTERY VOLTAGE: 2.94 V
MDC IDC MSMT LEADCHNL LV IMPEDANCE VALUE: 1045 Ohm
MDC IDC MSMT LEADCHNL LV IMPEDANCE VALUE: 1140 Ohm
MDC IDC MSMT LEADCHNL LV IMPEDANCE VALUE: 893 Ohm
MDC IDC MSMT LEADCHNL LV PACING THRESHOLD PULSEWIDTH: 1 ms
MDC IDC MSMT LEADCHNL RA PACING THRESHOLD AMPLITUDE: 0.875 V
MDC IDC MSMT LEADCHNL RA PACING THRESHOLD PULSEWIDTH: 0.4 ms
MDC IDC MSMT LEADCHNL RV IMPEDANCE VALUE: 399 Ohm
MDC IDC MSMT LEADCHNL RV PACING THRESHOLD AMPLITUDE: 0.625 V
MDC IDC PG IMPLANT DT: 20151030
MDC IDC SESS DTM: 20190222130435
MDC IDC SET LEADCHNL RV PACING AMPLITUDE: 2 V
MDC IDC SET LEADCHNL RV SENSING SENSITIVITY: 0.3 mV

## 2017-03-11 NOTE — Patient Instructions (Signed)
Medication Instructions:  Your physician recommends that you continue on your current medications as directed. Please refer to the Current Medication list given to you today.  Labwork: None ordered.  Testing/Procedures: None ordered.  Follow-Up: Your physician wants you to follow-up in: one year with Dr. Lovena Le.   You will receive a reminder letter in the mail two months in advance. If you don't receive a letter, please call our office to schedule the follow-up appointment.  Remote monitoring is used to monitor your ICD from home. This monitoring reduces the number of office visits required to check your device to one time per year. It allows [place] to keep an eye on the functioning of your device to ensure it is working properly. You are scheduled for a device check from home on 06/01/2017. You may send your transmission at any time that day. If you have a wireless device, the transmission will be sent automatically. After your physician reviews your transmission, you will receive a postcard with your next transmission date.  Any Other Special Instructions Will Be Listed Below (If Applicable).  If you need a refill on your cardiac medications before your next appointment, please call your pharmacy.

## 2017-03-11 NOTE — Progress Notes (Signed)
HPI Mrs. Austria returns today ICD followup. She is a pleasant 72 yo woman with a h/o chemotherapy for breast CA, who was initially discovered to have LV dysfunction by echo in 2010. She was placed on maximal medical therapy but ultimately had severe LV dysfunction and LBBB and underwent BiV ICD implant several years ago. Her EF has normalized. The patient has been stable in the interim. She has been bothered by lymphedema. She denies chest pain.  Allergies  Allergen Reactions  . Contrast Media [Iodinated Diagnostic Agents] Nausea And Vomiting    N&V  . Crestor [Rosuvastatin] Other (See Comments)    Myalgia, weakness  . Metrizamide Nausea And Vomiting  . Atorvastatin Other (See Comments)    Myalgia, weakness  . Lisinopril Other (See Comments)    REACTION: cough  . Naproxen Sodium Other (See Comments)    REACTION: Breathing difficulty  . Penicillins Other (See Comments)  . Shellfish Allergy Other (See Comments)    Cant breath   . Simvastatin Other (See Comments)    REACTION: muscle aches     Current Outpatient Medications  Medication Sig Dispense Refill  . albuterol (PROAIR HFA) 108 (90 Base) MCG/ACT inhaler Inhale 2 puffs into the lungs every 4 (four) hours as needed for wheezing or shortness of breath.    . cetirizine (ZYRTEC) 10 MG tablet Take 10 mg by mouth daily.    . Cholecalciferol (VITAMIN D) 400 UNITS capsule Take 400 Units by mouth daily.    Marland Kitchen EPINEPHrine 0.3 mg/0.3 mL IJ SOAJ injection Use as directed for severe allergic reaction 2 Device 1  . meclizine (ANTIVERT) 12.5 MG tablet Take 1 tablet (12.5 mg total) by mouth 3 (three) times daily as needed for dizziness. 90 tablet 3  . metoprolol tartrate (LOPRESSOR) 25 MG tablet TAKE 1 TABLET (25 MG TOTAL) BY MOUTH 2 (TWO) TIMES DAILY. 60 tablet 10  . Multiple Vitamin (MULTIVITAMIN) capsule Take 1 capsule by mouth at bedtime.     . pantoprazole (PROTONIX) 20 MG tablet Take 20 mg by mouth 2 (two) times daily.    .  ranitidine (ZANTAC) 75 MG tablet Take 75 mg by mouth 2 (two) times daily.    Marland Kitchen spironolactone (ALDACTONE) 25 MG tablet TAKE 1/2 TABLET (12.5 MG TOTAL) BY MOUTH DAILY. 45 tablet 3  . vitamin C (ASCORBIC ACID) 500 MG tablet Take 500 mg by mouth daily.    . furosemide (LASIX) 20 MG tablet TAKE 1 TABLET BY MOUTH DAILY (Patient not taking: Reported on 03/11/2017) 30 tablet 8   Current Facility-Administered Medications  Medication Dose Route Frequency Provider Last Rate Last Dose  . 0.9 %  sodium chloride infusion  500 mL Intravenous Continuous Milus Banister, MD      . 0.9 %  sodium chloride infusion  500 mL Intravenous Continuous Milus Banister, MD         Past Medical History:  Diagnosis Date  . AICD (automatic cardioverter/defibrillator) present   . Allergic state 05/17/2016   Dr Ernst Bowler asthma and allergy specialist.  . Allergy   . Arthritis    left knee pain, MRI in 2008-tricompartmental  degenerate changes, mucoid degeneration of ACL, post horn meniscal tear and ant horn meniscal tear  . Asthma   . Breast cancer in female [facility]) 2000   left; S/P lumpectomy, radiation and chemotherapy  . CHF (congestive heart failure) ([place])   . Colon polyp    unclear pathology  . Hyperlipidemia   . Hypertension   .  LBBB (left bundle branch block)   . Low back pain    left L3-4 injected  . Lymphedema of left arm   . Nonischemic cardiomyopathy ([place])   . Presence of permanent cardiac pacemaker   . Preventative health care 05/18/2016  . Travel sickness 05/12/2015  . Unspecified constipation 04/22/2012  . Vertigo     ROS:   All systems reviewed and negative except as noted in the HPI.   Past Surgical History:  Procedure Laterality Date  . APPENDECTOMY  2007/04/24   Dr. Lucia Gaskins  . BI-VENTRICULAR IMPLANTABLE CARDIOVERTER DEFIBRILLATOR N/A 11/16/2013   Procedure: BI-VENTRICULAR IMPLANTABLE CARDIOVERTER DEFIBRILLATOR  (CRT-D);  Surgeon: Evans Lance, MD;  Location: [facility] CATH LAB;  Service:  Cardiovascular;  Laterality: N/A;  . BI-VENTRICULAR IMPLANTABLE CARDIOVERTER DEFIBRILLATOR  (CRT-D)  11/16/13   MDT CRTD implanted by Dr Lovena Le for NICM, CHF, and LBBB  . BREAST BIOPSY Left 24-Apr-1998  . BREAST LUMPECTOMY Left 1998/04/24  . CARDIAC CATHETERIZATION  ~ April 23, 2008   "no blockage"  . CARPAL TUNNEL RELEASE Bilateral 04/23/2005   Dr. Daylene Katayama  . COLONOSCOPY    . KNEE ARTHROSCOPY Right   . POLYPECTOMY    . TONSILLECTOMY    . TRIGGER FINGER RELEASE Bilateral 04-23-05  . VAGINAL HYSTERECTOMY     Paritial     Family History  Problem Relation Age of Onset  . Heart attack Mother 37  . Diabetes Sister   . Diabetes Brother   . Coronary artery disease Other        Female first degree relative <60  . Hyperlipidemia Other   . Hypertension Other   . Cancer Other        prostate, 1st degree relative <50  . Colon cancer Maternal Aunt   . Allergic rhinitis Neg Hx   . Angioedema Neg Hx   . Asthma Neg Hx   . Eczema Neg Hx   . Immunodeficiency Neg Hx   . Urticaria Neg Hx   . Stomach cancer Neg Hx   . Rectal cancer Neg Hx   . Esophageal cancer Neg Hx   . Liver cancer Neg Hx   . Colon polyps Neg Hx      Social History   Socioeconomic History  . Marital status: Widowed    Spouse name: Not on file  . Number of children: 2  . Years of education: 35  . Highest education level: Not on file  Social Needs  . Financial resource strain: Not on file  . Food insecurity - worry: Not on file  . Food insecurity - inability: Not on file  . Transportation needs - medical: Not on file  . Transportation needs - non-medical: Not on file  Occupational History    Employer: DISABLED  Tobacco Use  . Smoking status: Former Smoker    Packs/day: 0.10    Years: 2.00    Pack years: 0.20    Types: Cigarettes    Last attempt to quit: 01/18/1970    Years since quitting: 47.1  . Smokeless tobacco: Never Used  . Tobacco comment: "smoked maybe 1 pack/month when I did smoke  Substance and Sexual Activity  . Alcohol use:  No  . Drug use: No  . Sexual activity: No    Birth control/protection: Surgical, Post-menopausal  Other Topics Concern  . Not on file  Social History Narrative   Widow - husband died in 2003-04-24   Occupation - retired from working in Radiation protection practitioner   2 daughter - on lives  in [place], other in [place]   Remote history of tobacco but none in 40 years   Caffeine- coffee, 1 cup daily; 1 energy drink daily (V8)     BP 120/70   Pulse 74   Ht 5\' 5"  (1.651 m)   Wt 207 lb (93.9 kg)   BMI 34.45 kg/m   Physical Exam:  Well appearing NAD HEENT: Unremarkable Neck:  No JVD, no thyromegally Lymphatics:  No adenopathy Back:  No CVA tenderness Lungs:  Clear HEART:  Regular rate rhythm, no murmurs, no rubs, no clicks Abd:  soft, positive bowel sounds, no organomegally, no rebound, no guarding Ext:  2 plus pulses, no edema, no cyanosis, no clubbing Skin:  No rashes no nodules Neuro:  CN II through XII intact, motor grossly intact  EKG - NSR with biv pacing  DEVICE  Normal device function.  See PaceArt for details.   Assess/Plan: 1. Chronic systolic heart failure - her symptoms appear to be class 1. Her EF normalized. She will continue her current meds.  2. ICD - her BiV ICD is working normally. She has about 2 years of battery longevity. I would anticipate downgrading her to a biv ppm if her CHF MD's agree when her device reaches ERI. 3. HTN - her blood pressure is well controlled. Will follow. 4. Obesity - she is encouraged to lose weight.   Mikle Bosworth.D.

## 2017-03-12 LAB — CUP PACEART REMOTE DEVICE CHECK
Brady Statistic AS VP Percent: 98.6 %
Brady Statistic RA Percent Paced: 0.05 %
Brady Statistic RV Percent Paced: 68.5 %
Date Time Interrogation Session: 20190213062503
HighPow Impedance: 71 Ohm
Implantable Lead Implant Date: 20151030
Implantable Lead Implant Date: 20151030
Implantable Lead Location: 753858
Implantable Lead Model: 6935
Implantable Pulse Generator Implant Date: 20151030
Lead Channel Impedance Value: 399 Ohm
Lead Channel Impedance Value: 456 Ohm
Lead Channel Impedance Value: 570 Ohm
Lead Channel Impedance Value: 665 Ohm
Lead Channel Impedance Value: 855 Ohm
Lead Channel Impedance Value: 969 Ohm
Lead Channel Pacing Threshold Amplitude: 0.625 V
Lead Channel Pacing Threshold Pulse Width: 0.4 ms
Lead Channel Pacing Threshold Pulse Width: 0.4 ms
Lead Channel Sensing Intrinsic Amplitude: 11.875 mV
Lead Channel Sensing Intrinsic Amplitude: 11.875 mV
Lead Channel Sensing Intrinsic Amplitude: 3.25 mV
Lead Channel Setting Pacing Amplitude: 2 V
Lead Channel Setting Pacing Amplitude: 2.5 V
Lead Channel Setting Pacing Pulse Width: 1 ms
MDC IDC LEAD IMPLANT DT: 20151030
MDC IDC LEAD LOCATION: 753859
MDC IDC LEAD LOCATION: 753860
MDC IDC MSMT BATTERY REMAINING LONGEVITY: 24 mo
MDC IDC MSMT BATTERY VOLTAGE: 2.93 V
MDC IDC MSMT LEADCHNL LV IMPEDANCE VALUE: 1083 Ohm
MDC IDC MSMT LEADCHNL LV IMPEDANCE VALUE: 1102 Ohm
MDC IDC MSMT LEADCHNL LV IMPEDANCE VALUE: 570 Ohm
MDC IDC MSMT LEADCHNL LV IMPEDANCE VALUE: 665 Ohm
MDC IDC MSMT LEADCHNL LV IMPEDANCE VALUE: 817 Ohm
MDC IDC MSMT LEADCHNL LV PACING THRESHOLD AMPLITUDE: 1.75 V
MDC IDC MSMT LEADCHNL LV PACING THRESHOLD PULSEWIDTH: 1 ms
MDC IDC MSMT LEADCHNL RA PACING THRESHOLD AMPLITUDE: 0.875 V
MDC IDC MSMT LEADCHNL RA SENSING INTR AMPL: 3.25 mV
MDC IDC MSMT LEADCHNL RV IMPEDANCE VALUE: 342 Ohm
MDC IDC MSMT LEADCHNL RV IMPEDANCE VALUE: 437 Ohm
MDC IDC SET LEADCHNL RV PACING AMPLITUDE: 2.5 V
MDC IDC SET LEADCHNL RV PACING PULSEWIDTH: 0.4 ms
MDC IDC SET LEADCHNL RV SENSING SENSITIVITY: 0.3 mV
MDC IDC STAT BRADY AP VP PERCENT: 0.04 %
MDC IDC STAT BRADY AP VS PERCENT: 0.01 %
MDC IDC STAT BRADY AS VS PERCENT: 1.35 %

## 2017-03-16 DIAGNOSIS — M549 Dorsalgia, unspecified: Secondary | ICD-10-CM | POA: Diagnosis not present

## 2017-03-16 DIAGNOSIS — M542 Cervicalgia: Secondary | ICD-10-CM | POA: Diagnosis not present

## 2017-03-29 ENCOUNTER — Other Ambulatory Visit: Payer: Self-pay | Admitting: Cardiology

## 2017-03-29 DIAGNOSIS — I5022 Chronic systolic (congestive) heart failure: Secondary | ICD-10-CM

## 2017-03-30 ENCOUNTER — Ambulatory Visit: Payer: Medicare HMO | Admitting: Cardiology

## 2017-04-08 ENCOUNTER — Other Ambulatory Visit: Payer: Self-pay | Admitting: *Deleted

## 2017-04-08 MED ORDER — MECLIZINE HCL 12.5 MG PO TABS: 12.5000 mg | ORAL_TABLET | Freq: Three times a day (TID) | ORAL | 3 refills | Status: DC | PRN

## 2017-05-11 ENCOUNTER — Encounter: Payer: Self-pay | Admitting: Cardiology

## 2017-05-18 ENCOUNTER — Telehealth: Payer: Self-pay | Admitting: Cardiology

## 2017-05-18 NOTE — Telephone Encounter (Signed)
Spoke with pt, Aware of dr crenshaw's recommendations.  °

## 2017-05-18 NOTE — Telephone Encounter (Signed)
New message   Pt c/o medication issue:  1. Name of Medication: metoprolol tartrate (LOPRESSOR) 25 MG tablet  2. How are you currently taking this medication (dosage and times per day)?   3. Are you having a reaction (difficulty breathing--STAT)? Difficulty breathing  4. What is your medication issue? Feels medication is causing her to have difficulty breathing

## 2017-05-18 NOTE — Telephone Encounter (Signed)
Per pt had an episode this am after taking Metoprolol where she couldn't breath feels better now.Pt states had this type of episode with Carvedilol after taking med for some time and was switched to Metoprolol.Pt says these episodes are very scary she feels like she can't breath and she lives alone.Will forward to Dr Stanford Breed for review and recommendations./cy

## 2017-05-18 NOTE — Addendum Note (Signed)
Addended by: Cristopher Estimable on: 05/18/2017 09:37 AM   Modules accepted: Orders

## 2017-05-18 NOTE — Telephone Encounter (Signed)
DC metoprolol and schedule fu ov with me (elective) Kirk Ruths

## 2017-05-19 ENCOUNTER — Ambulatory Visit: Payer: Medicare HMO | Admitting: *Deleted

## 2017-05-23 NOTE — Progress Notes (Signed)
HPI: FU dilated cardiomyopathy. Patient has a history of left bundle branch block. She underwent cardiac catheterization in [facility] in March of 2010 for reduced LV function. Ejection fraction was 35%. The left main was normal. The LAD had a proximal 25-30% lesion. The circumflex was normal. The right coronary artery had a 15% lesion. Patient was treated medically. Carotid Dopplers in October 2015 showed a 50-69% right and no left stenosis. Echocardiogram in August 2015 showed an ejection fraction of 25-30% and restrictive filling. Had biventricular ICD October 2015. Echo 11/16 showed EF 40, grade 1 DD. Pt declined carotid dopplers in past. Allergic to ASA and declined plavix in past. Echo 1/18 showed normal LV function. Since she was last seen,  patient denies dyspnea, chest pain, palpitations or syncope.  Current Outpatient Medications  Medication Sig Dispense Refill  . albuterol (PROAIR HFA) 108 (90 Base) MCG/ACT inhaler Inhale 2 puffs into the lungs every 4 (four) hours as needed for wheezing or shortness of breath.    . cetirizine (ZYRTEC) 10 MG tablet Take 10 mg by mouth daily.    . Cholecalciferol (VITAMIN D) 400 UNITS capsule Take 400 Units by mouth daily.    Marland Kitchen EPINEPHrine 0.3 mg/0.3 mL IJ SOAJ injection Use as directed for severe allergic reaction 2 Device 1  . meclizine (ANTIVERT) 12.5 MG tablet Take 1 tablet (12.5 mg total) by mouth 3 (three) times daily as needed for dizziness. 90 tablet 3  . Multiple Vitamin (MULTIVITAMIN) capsule Take 1 capsule by mouth at bedtime.     . pantoprazole (PROTONIX) 20 MG tablet Take 20 mg by mouth 2 (two) times daily.    . ranitidine (ZANTAC) 75 MG tablet Take 75 mg by mouth 2 (two) times daily.    Marland Kitchen spironolactone (ALDACTONE) 25 MG tablet TAKE 1/2 TABLET BY MOUTH DAILY 45 tablet 1  . vitamin C (ASCORBIC ACID) 500 MG tablet Take 500 mg by mouth daily.     Current Facility-Administered Medications  Medication Dose Route Frequency Provider Last  Rate Last Dose  . 0.9 %  sodium chloride infusion  500 mL Intravenous Continuous Milus Banister, MD      . 0.9 %  sodium chloride infusion  500 mL Intravenous Continuous Milus Banister, MD         Past Medical History:  Diagnosis Date  . AICD (automatic cardioverter/defibrillator) present   . Allergic state 05/17/2016   Dr Ernst Bowler asthma and allergy specialist.  . Allergy   . Arthritis    left knee pain, MRI in 2008-tricompartmental  degenerate changes, mucoid degeneration of ACL, post horn meniscal tear and ant horn meniscal tear  . Asthma   . Breast cancer in female [facility]) 2000   left; S/P lumpectomy, radiation and chemotherapy  . CHF (congestive heart failure) ([place])   . Colon polyp    unclear pathology  . Hyperlipidemia   . Hypertension   . LBBB (left bundle branch block)   . Low back pain    left L3-4 injected  . Lymphedema of left arm   . Nonischemic cardiomyopathy ([place])   . Presence of permanent cardiac pacemaker   . Preventative health care 05/18/2016  . Travel sickness 05/12/2015  . Unspecified constipation 04/22/2012  . Vertigo     Past Surgical History:  Procedure Laterality Date  . APPENDECTOMY  2009   Dr. Lucia Gaskins  . BI-VENTRICULAR IMPLANTABLE CARDIOVERTER DEFIBRILLATOR N/A 11/16/2013   Procedure: BI-VENTRICULAR IMPLANTABLE CARDIOVERTER DEFIBRILLATOR  (CRT-D);  Surgeon:  Evans Lance, MD;  Location: [facility] CATH LAB;  Service: Cardiovascular;  Laterality: N/A;  . BI-VENTRICULAR IMPLANTABLE CARDIOVERTER DEFIBRILLATOR  (CRT-D)  11/16/13   MDT CRTD implanted by Dr Lovena Le for NICM, CHF, and LBBB  . BREAST BIOPSY Left 1998-04-24  . BREAST LUMPECTOMY Left 04-24-1998  . CARDIAC CATHETERIZATION  ~ 2008/04/23   "no blockage"  . CARPAL TUNNEL RELEASE Bilateral 04/23/05   Dr. Daylene Katayama  . COLONOSCOPY    . KNEE ARTHROSCOPY Right   . POLYPECTOMY    . TONSILLECTOMY    . TRIGGER FINGER RELEASE Bilateral 2005-04-23  . VAGINAL HYSTERECTOMY     Paritial    Social History   Socioeconomic History  .  Marital status: Widowed    Spouse name: Not on file  . Number of children: 2  . Years of education: 33  . Highest education level: Not on file  Occupational History    Employer: DISABLED  Social Needs  . Financial resource strain: Not on file  . Food insecurity:    Worry: Not on file    Inability: Not on file  . Transportation needs:    Medical: Not on file    Non-medical: Not on file  Tobacco Use  . Smoking status: Former Smoker    Packs/day: 0.10    Years: 2.00    Pack years: 0.20    Types: Cigarettes    Last attempt to quit: 01/18/1970    Years since quitting: 47.3  . Smokeless tobacco: Never Used  . Tobacco comment: "smoked maybe 1 pack/month when I did smoke  Substance and Sexual Activity  . Alcohol use: No  . Drug use: No  . Sexual activity: Never    Birth control/protection: Surgical, Post-menopausal  Lifestyle  . Physical activity:    Days per week: Not on file    Minutes per session: Not on file  . Stress: Not on file  Relationships  . Social connections:    Talks on phone: Not on file    Gets together: Not on file    Attends religious service: Not on file    Active member of club or organization: Not on file    Attends meetings of clubs or organizations: Not on file    Relationship status: Not on file  . Intimate partner violence:    Fear of current or ex partner: Not on file    Emotionally abused: Not on file    Physically abused: Not on file    Forced sexual activity: Not on file  Other Topics Concern  . Not on file  Social History Narrative   Widow - husband died in 04-24-2003   Occupation - retired from working in Radiation protection practitioner   2 daughter - on lives in [place], other in [place]   Remote history of tobacco but none in 40 years   Caffeine- coffee, 1 cup daily; 1 energy drink daily (V8)    Family History  Problem Relation Age of Onset  . Heart attack Mother 67  . Diabetes Sister   . Diabetes Brother   . Coronary artery disease Other        Female  first degree relative <60  . Hyperlipidemia Other   . Hypertension Other   . Cancer Other        prostate, 1st degree relative <50  . Colon cancer Maternal Aunt   . Allergic rhinitis Neg Hx   . Angioedema Neg Hx   . Asthma Neg Hx   . Eczema  Neg Hx   . Immunodeficiency Neg Hx   . Urticaria Neg Hx   . Stomach cancer Neg Hx   . Rectal cancer Neg Hx   . Esophageal cancer Neg Hx   . Liver cancer Neg Hx   . Colon polyps Neg Hx     ROS: no fevers or chills, productive cough, hemoptysis, dysphasia, odynophagia, melena, hematochezia, dysuria, hematuria, rash, seizure activity, orthopnea, PND, pedal edema, claudication. Remaining systems are negative.  Physical Exam: Well-developed obese in no acute distress.  Skin is warm and dry.  HEENT is normal.  Neck is supple.  Chest is clear to auscultation with normal expansion.  Cardiovascular exam is regular rate and rhythm.  Abdominal exam nontender or distended. No masses palpated. Extremities show no edema. neuro grossly intact   A/P  1 carotid artery disease-we again discussed repeating her carotid Dopplers.  She is willing to proceed in 3 months and we will arrange.  She is allergic to aspirin and intolerant to statins.  She declines Plavix.  2 hypertension-blood pressure is elevated.  We discussed blood pressure control but she is hesitant to take medications.  She is agreeable to try bisoprolol 2.5 mg daily.  She states her blood pressure is controlled at home.  3 history of cardiomyopathy-LV function has normalized on most recent echocardiogram.  I will resume bisoprolol to see if she tolerates.  Did not tolerate ARB.  4 hyperlipidemia-patient is intolerant to statins.  Continue diet.  Not interested in other lipid-lowering medications.   6 chronic diastolic congestive heart failure-she is euvolemic. Continue low-sodium diet and fluid restriction.  I have given her a prescription for Lasix 20 mg daily as needed.  7 prior  ICD-followed by electrophysiology.  Kirk Ruths, MD

## 2017-05-25 ENCOUNTER — Ambulatory Visit: Payer: Medicare HMO | Admitting: Cardiology

## 2017-05-25 ENCOUNTER — Encounter: Payer: Self-pay | Admitting: Cardiology

## 2017-05-25 VITALS — BP 160/80 | HR 69 | Ht 65.0 in | Wt 209.8 lb

## 2017-05-25 DIAGNOSIS — I5022 Chronic systolic (congestive) heart failure: Secondary | ICD-10-CM

## 2017-05-25 DIAGNOSIS — I10 Essential (primary) hypertension: Secondary | ICD-10-CM | POA: Diagnosis not present

## 2017-05-25 DIAGNOSIS — I5032 Chronic diastolic (congestive) heart failure: Secondary | ICD-10-CM

## 2017-05-25 DIAGNOSIS — I679 Cerebrovascular disease, unspecified: Secondary | ICD-10-CM | POA: Diagnosis not present

## 2017-05-25 DIAGNOSIS — E7800 Pure hypercholesterolemia, unspecified: Secondary | ICD-10-CM | POA: Diagnosis not present

## 2017-05-25 MED ORDER — BISOPROLOL FUMARATE 5 MG PO TABS: 2.5000 mg | ORAL_TABLET | Freq: Every day | ORAL | 3 refills | Status: DC

## 2017-05-25 MED ORDER — FUROSEMIDE 20 MG PO TABS: ORAL_TABLET | ORAL | 3 refills | Status: DC

## 2017-05-25 NOTE — Patient Instructions (Signed)
Medication Instructions:   START BISOPROLOL 2.5 MG ONCE DAILY AT BEDTIME= 1/2 OF THE 5 MG TABLET ONCE DAILY  TAKE FUROSEMIDE 20 MG ONCE DAILY AS NEEDED FOR SWELLING  Labwork:  Your physician recommends that you HAVE LAB WORK TODAY  Testing/Procedures:  Your physician has requested that you have a carotid duplex. This test is an ultrasound of the carotid arteries in your neck. It looks at blood flow through these arteries that supply the brain with blood. Allow one hour for this exam. There are no restrictions or special instructions.CALL [phone] TO SCHEDULE AT THE [place] [facility]ICE    Follow-Up:  Your physician wants you to follow-up in: [place] will receive a reminder letter in the mail two months in advance. If you don't receive a letter, please call our office to schedule the follow-up appointment.   If you need a refill on your cardiac medications before your next appointment, please call your pharmacy.

## 2017-05-26 ENCOUNTER — Encounter: Payer: Self-pay | Admitting: *Deleted

## 2017-05-26 LAB — BASIC METABOLIC PANEL
BUN / CREAT RATIO: 11 — AB (ref 12–28)
BUN: 9 mg/dL (ref 8–27)
CALCIUM: 10 mg/dL (ref 8.7–10.3)
CO2: 23 mmol/L (ref 20–29)
CREATININE: 0.84 mg/dL (ref 0.57–1.00)
Chloride: 106 mmol/L (ref 96–106)
GFR calc non Af Amer: 70 mL/min/{1.73_m2} (ref >59)
GFR, EST AFRICAN AMERICAN: 81 mL/min/{1.73_m2} (ref >59)
GLUCOSE: 70 mg/dL (ref 65–99)
Potassium: 4 mmol/L (ref 3.5–5.2)
Sodium: 143 mmol/L (ref 134–144)

## 2017-06-01 ENCOUNTER — Ambulatory Visit (INDEPENDENT_AMBULATORY_CARE_PROVIDER_SITE_OTHER): Payer: Medicare HMO | Admitting: *Deleted

## 2017-06-01 DIAGNOSIS — I428 Other cardiomyopathies: Secondary | ICD-10-CM

## 2017-06-01 DIAGNOSIS — I5022 Chronic systolic (congestive) heart failure: Secondary | ICD-10-CM

## 2017-06-01 NOTE — Progress Notes (Signed)
Remote ICD transmission.   

## 2017-06-02 LAB — CUP PACEART REMOTE DEVICE CHECK
Battery Remaining Longevity: 22 mo
Battery Voltage: 2.92 V
Brady Statistic AS VP Percent: 98.58 %
Brady Statistic RA Percent Paced: 0.06 %
Date Time Interrogation Session: 20190515041802
HIGH POWER IMPEDANCE MEASURED VALUE: 78 Ohm
Implantable Lead Implant Date: 20151030
Implantable Lead Location: 753859
Implantable Pulse Generator Implant Date: 20151030
Lead Channel Impedance Value: 1045 Ohm
Lead Channel Impedance Value: 1159 Ohm
Lead Channel Impedance Value: 380 Ohm
Lead Channel Impedance Value: 437 Ohm
Lead Channel Impedance Value: 437 Ohm
Lead Channel Impedance Value: 570 Ohm
Lead Channel Impedance Value: 703 Ohm
Lead Channel Impedance Value: 855 Ohm
Lead Channel Impedance Value: 893 Ohm
Lead Channel Pacing Threshold Amplitude: 0.5 V
Lead Channel Pacing Threshold Amplitude: 0.75 V
Lead Channel Pacing Threshold Pulse Width: 0.4 ms
Lead Channel Pacing Threshold Pulse Width: 1 ms
Lead Channel Sensing Intrinsic Amplitude: 13.875 mV
Lead Channel Setting Pacing Amplitude: 2 V
Lead Channel Setting Pacing Amplitude: 2 V
Lead Channel Setting Pacing Pulse Width: 1 ms
Lead Channel Setting Sensing Sensitivity: 0.3 mV
MDC IDC LEAD IMPLANT DT: 20151030
MDC IDC LEAD IMPLANT DT: 20151030
MDC IDC LEAD LOCATION: 753858
MDC IDC LEAD LOCATION: 753860
MDC IDC MSMT LEADCHNL LV IMPEDANCE VALUE: 1140 Ohm
MDC IDC MSMT LEADCHNL LV IMPEDANCE VALUE: 532 Ohm
MDC IDC MSMT LEADCHNL LV IMPEDANCE VALUE: 703 Ohm
MDC IDC MSMT LEADCHNL LV PACING THRESHOLD AMPLITUDE: 1.625 V
MDC IDC MSMT LEADCHNL RA IMPEDANCE VALUE: 456 Ohm
MDC IDC MSMT LEADCHNL RA SENSING INTR AMPL: 3.75 mV
MDC IDC MSMT LEADCHNL RA SENSING INTR AMPL: 3.75 mV
MDC IDC MSMT LEADCHNL RV PACING THRESHOLD PULSEWIDTH: 0.4 ms
MDC IDC MSMT LEADCHNL RV SENSING INTR AMPL: 13.875 mV
MDC IDC SET LEADCHNL LV PACING AMPLITUDE: 2.5 V
MDC IDC SET LEADCHNL RV PACING PULSEWIDTH: 0.4 ms
MDC IDC STAT BRADY AP VP PERCENT: 0.04 %
MDC IDC STAT BRADY AP VS PERCENT: 0.02 %
MDC IDC STAT BRADY AS VS PERCENT: 1.37 %
MDC IDC STAT BRADY RV PERCENT PACED: 75.43 %

## 2017-06-03 ENCOUNTER — Encounter: Payer: Self-pay | Admitting: Cardiology

## 2017-07-13 DIAGNOSIS — I509 Heart failure, unspecified: Secondary | ICD-10-CM | POA: Diagnosis not present

## 2017-07-13 DIAGNOSIS — J45909 Unspecified asthma, uncomplicated: Secondary | ICD-10-CM | POA: Diagnosis not present

## 2017-07-13 DIAGNOSIS — K219 Gastro-esophageal reflux disease without esophagitis: Secondary | ICD-10-CM | POA: Diagnosis not present

## 2017-07-13 DIAGNOSIS — I110 Hypertensive heart disease with heart failure: Secondary | ICD-10-CM | POA: Diagnosis not present

## 2017-07-13 DIAGNOSIS — Z6835 Body mass index (BMI) 35.0-35.9, adult: Secondary | ICD-10-CM | POA: Diagnosis not present

## 2017-07-13 DIAGNOSIS — Z809 Family history of malignant neoplasm, unspecified: Secondary | ICD-10-CM | POA: Diagnosis not present

## 2017-07-13 DIAGNOSIS — R7303 Prediabetes: Secondary | ICD-10-CM | POA: Diagnosis not present

## 2017-07-13 DIAGNOSIS — R42 Dizziness and giddiness: Secondary | ICD-10-CM | POA: Diagnosis not present

## 2017-07-13 DIAGNOSIS — R609 Edema, unspecified: Secondary | ICD-10-CM | POA: Diagnosis not present

## 2017-08-22 IMAGING — CT CT HEAD W/O CM
3 series · 15 of 47 positions shown, 18 images · non-contrast
Comparison: 11/23/2014

CLINICAL DATA: Chronic vertigo, will worse recently. Gait
difficulty.

EXAM:
CT HEAD WITHOUT CONTRAST
TECHNIQUE: Contiguous axial images were obtained from the base of the skull
through the vertex without intravenous contrast.

[Series 2: head wo · axial · 0.42mm/px · z∈[-158,-33]mm · 9 of 30 slices shown, 12 images]
[im 3/30  brain]
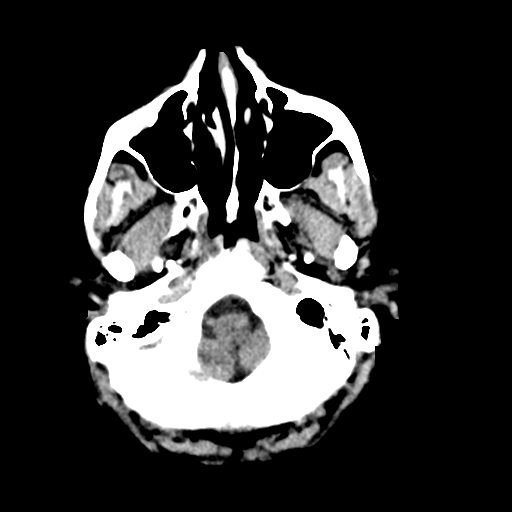
[im 3/30  bone]
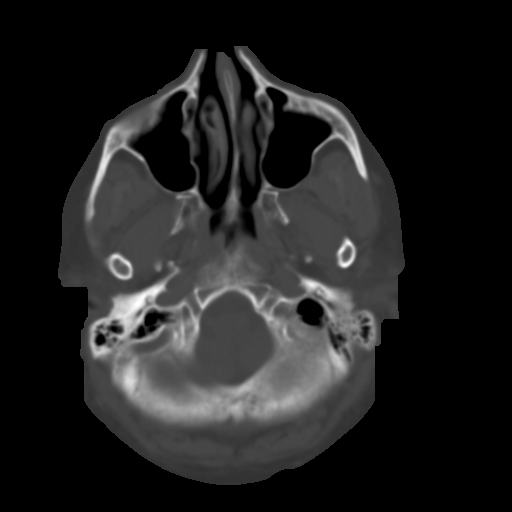
[im 6/30  brain]
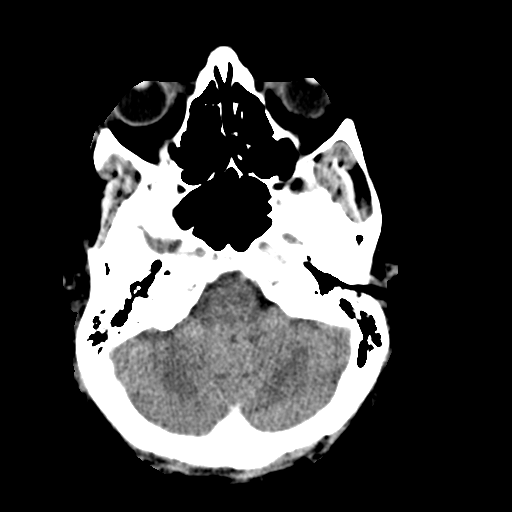
[im 9/30  brain]
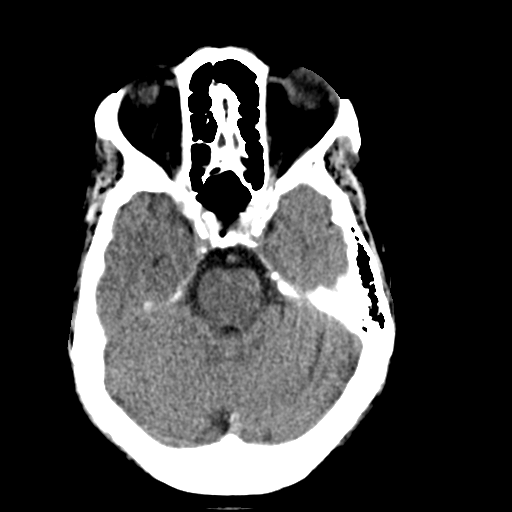
[im 12/30  brain]
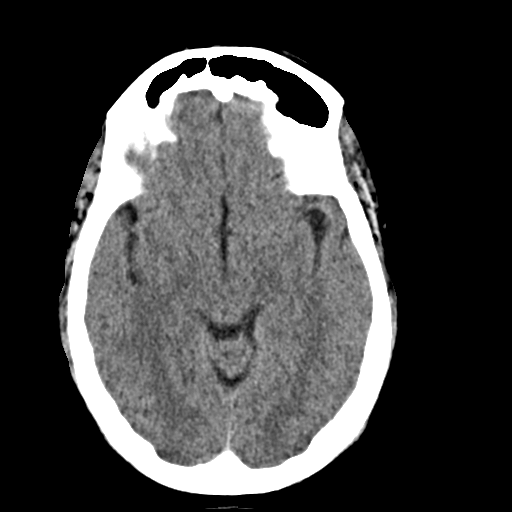
[im 16/30  brain]
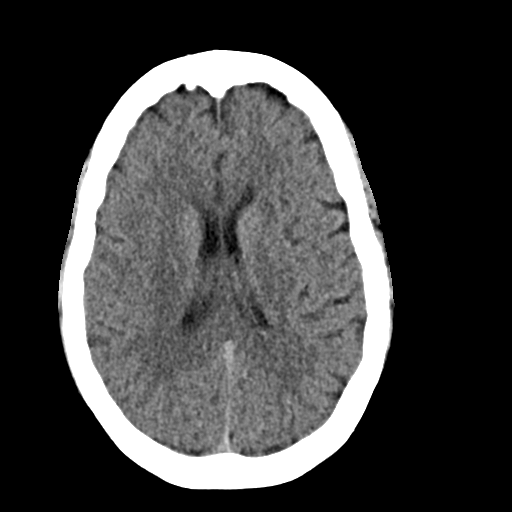
[im 16/30  bone]
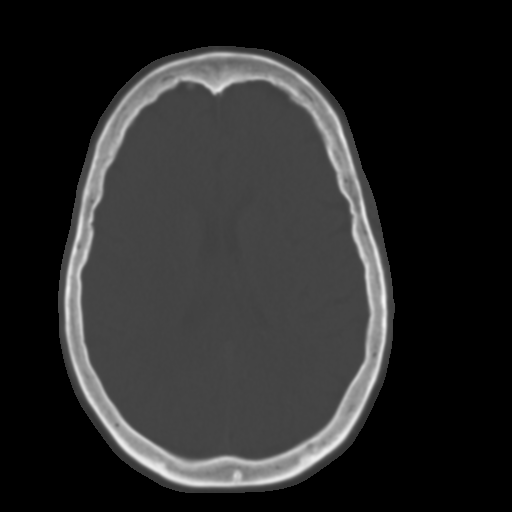
[im 19/30  brain]
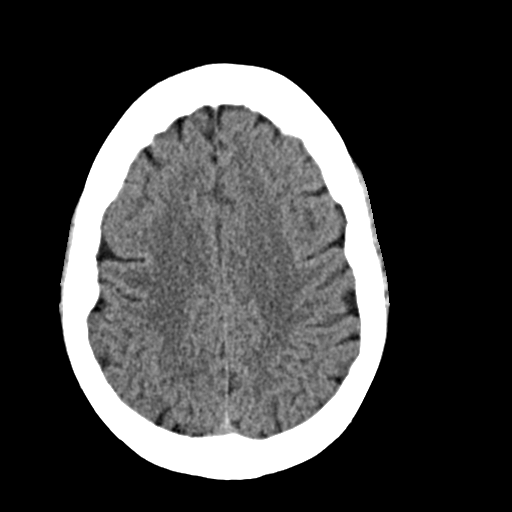
[im 22/30  brain]
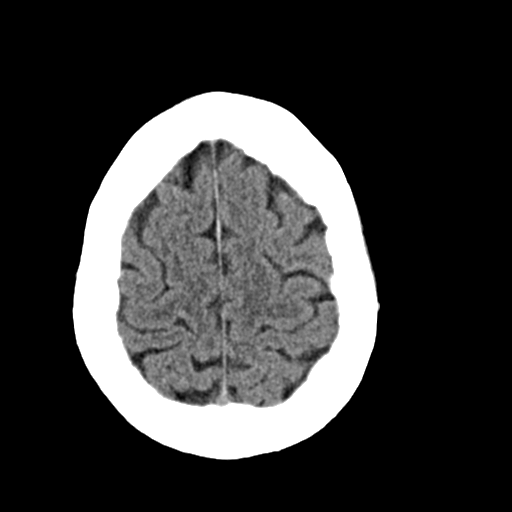
[im 25/30  brain]
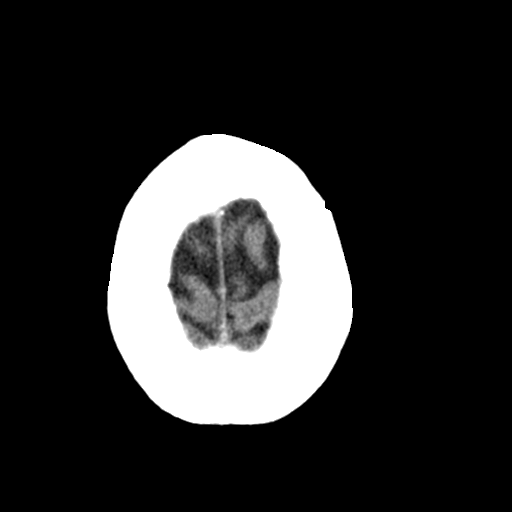
[im 28/30  brain]
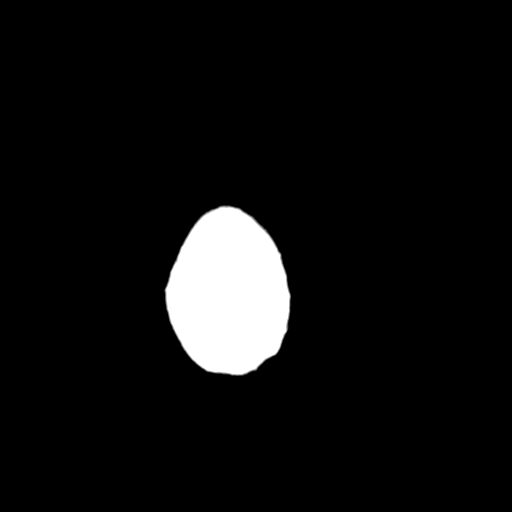
[im 28/30  bone]
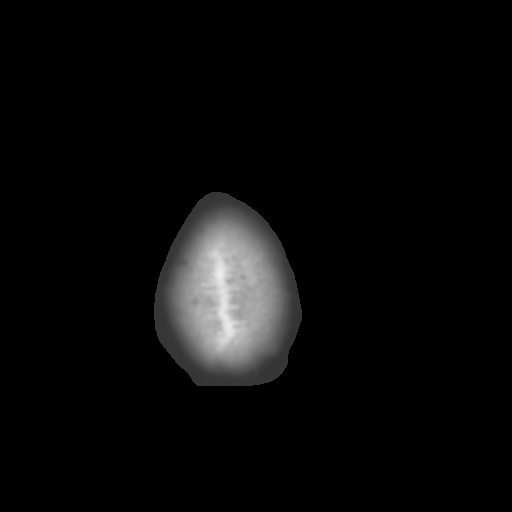

[Series 4: coronal soft · coronal · 0.30mm/px · 3 of 70 slices shown]
[im 24/70  brain]
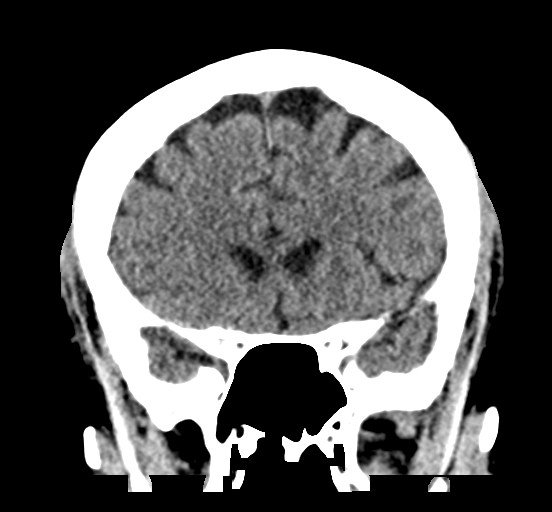
[im 31/70  brain]
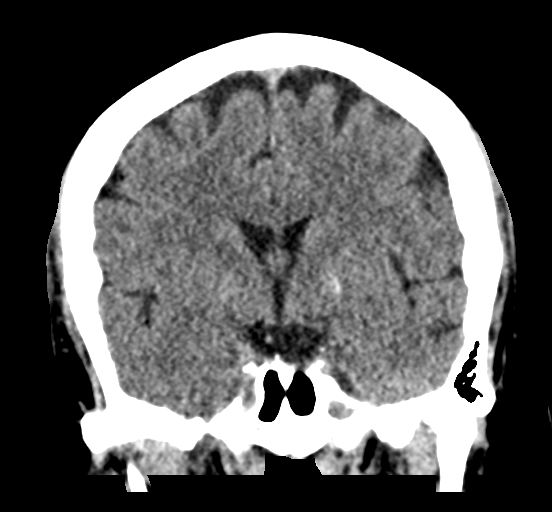
[im 39/70  brain]
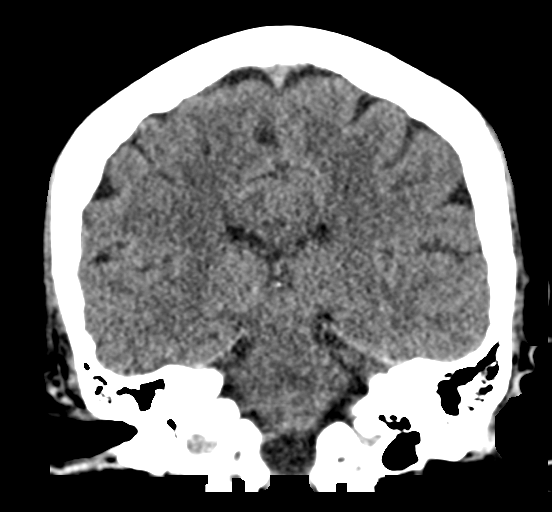

[Series 5: sag soft · sagittal · 0.29mm/px · 3 of 57 slices shown]
[im 19/57  brain]
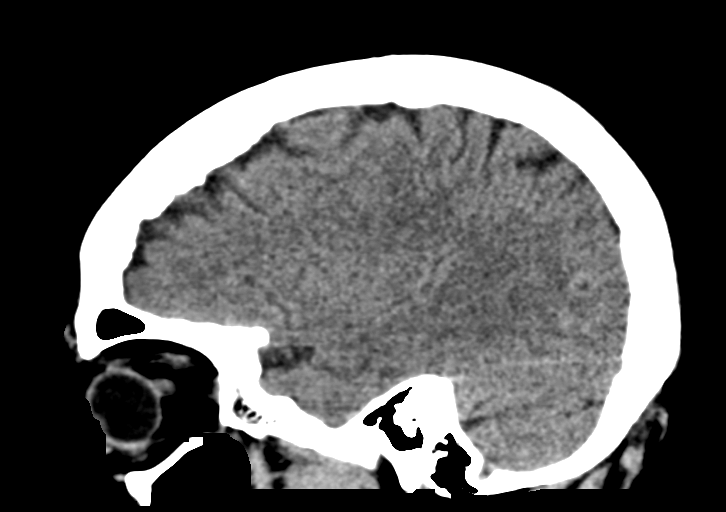
[im 29/57  brain]
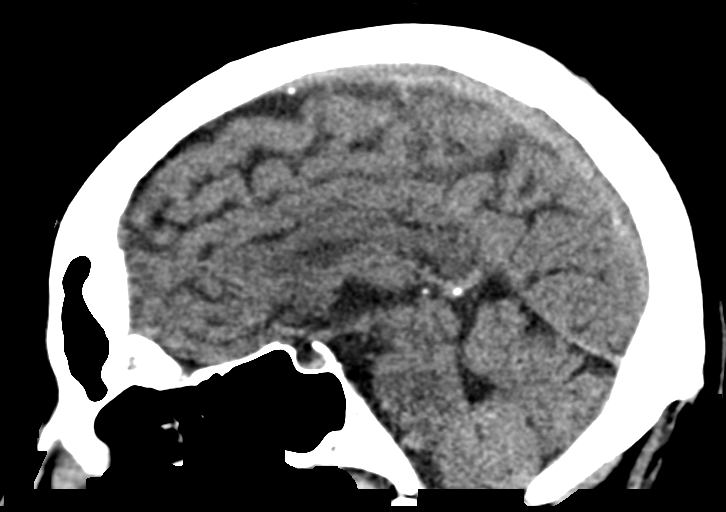
[im 38/57  brain]
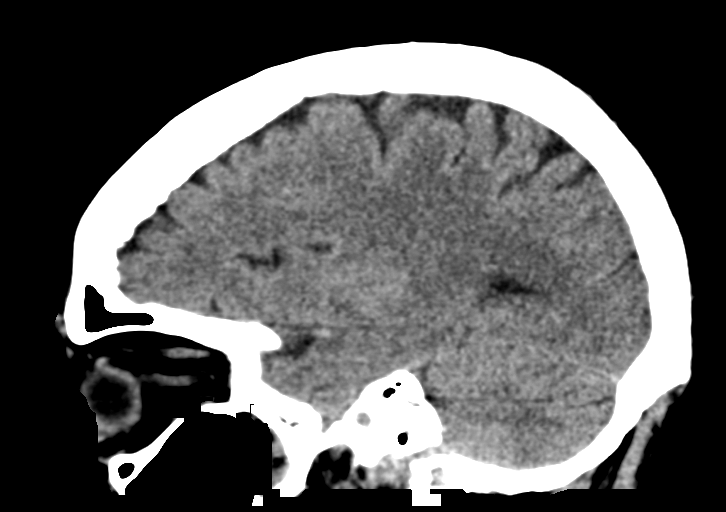

[15 of 47 positions shown; findings below may reference images not displayed]

FINDINGS: Brain: There is no evidence of acute infarct, intracranial
hemorrhage, mass, midline shift, or extra-axial fluid collection.
The ventricles and sulci are normal.

Vascular: Calcified atherosclerosis at the skullbase. No hyperdense
vessel.

Skull: No fracture or focal osseous lesion.

Sinuses/Orbits: Visualized paranasal sinuses and mastoid air cells
are clear. Orbits are unremarkable.

Other: None.
IMPRESSION: Unremarkable CT appearance of the brain.

## 2017-08-25 ENCOUNTER — Other Ambulatory Visit: Payer: Self-pay | Admitting: Hematology & Oncology

## 2017-08-25 DIAGNOSIS — Z1231 Encounter for screening mammogram for malignant neoplasm of breast: Secondary | ICD-10-CM

## 2017-08-31 ENCOUNTER — Ambulatory Visit (INDEPENDENT_AMBULATORY_CARE_PROVIDER_SITE_OTHER): Payer: Medicare HMO | Admitting: *Deleted

## 2017-08-31 ENCOUNTER — Other Ambulatory Visit: Payer: Self-pay | Admitting: *Deleted

## 2017-08-31 DIAGNOSIS — I679 Cerebrovascular disease, unspecified: Secondary | ICD-10-CM

## 2017-08-31 DIAGNOSIS — I5022 Chronic systolic (congestive) heart failure: Secondary | ICD-10-CM

## 2017-08-31 NOTE — Progress Notes (Signed)
Remote ICD transmission.   

## 2017-09-01 ENCOUNTER — Ambulatory Visit (HOSPITAL_BASED_OUTPATIENT_CLINIC_OR_DEPARTMENT_OTHER): Payer: Medicare HMO

## 2017-09-07 ENCOUNTER — Ambulatory Visit (HOSPITAL_BASED_OUTPATIENT_CLINIC_OR_DEPARTMENT_OTHER)
Admission: RE | Admit: 2017-09-07 | Discharge: 2017-09-07 | Disposition: A | Discharge status: Home / Self Care | Payer: Medicare HMO | Source: Ambulatory Visit | Attending: Cardiology | Admitting: Cardiology

## 2017-09-07 ENCOUNTER — Ambulatory Visit (HOSPITAL_BASED_OUTPATIENT_CLINIC_OR_DEPARTMENT_OTHER)
Admission: RE | Admit: 2017-09-07 | Discharge: 2017-09-07 | Disposition: A | Discharge status: Home / Self Care | Payer: Medicare HMO | Source: Ambulatory Visit | Attending: Hematology & Oncology | Admitting: Hematology & Oncology

## 2017-09-07 DIAGNOSIS — I6523 Occlusion and stenosis of bilateral carotid arteries: Secondary | ICD-10-CM | POA: Diagnosis not present

## 2017-09-07 DIAGNOSIS — I679 Cerebrovascular disease, unspecified: Secondary | ICD-10-CM | POA: Insufficient documentation

## 2017-09-07 DIAGNOSIS — Z1231 Encounter for screening mammogram for malignant neoplasm of breast: Secondary | ICD-10-CM

## 2017-09-11 ENCOUNTER — Other Ambulatory Visit: Payer: Self-pay | Admitting: Gastroenterology

## 2017-09-14 DIAGNOSIS — H25012 Cortical age-related cataract, left eye: Secondary | ICD-10-CM | POA: Diagnosis not present

## 2017-09-14 DIAGNOSIS — H5203 Hypermetropia, bilateral: Secondary | ICD-10-CM | POA: Diagnosis not present

## 2017-09-14 DIAGNOSIS — H2513 Age-related nuclear cataract, bilateral: Secondary | ICD-10-CM | POA: Diagnosis not present

## 2017-09-14 DIAGNOSIS — E119 Type 2 diabetes mellitus without complications: Secondary | ICD-10-CM | POA: Diagnosis not present

## 2017-09-14 DIAGNOSIS — H524 Presbyopia: Secondary | ICD-10-CM | POA: Diagnosis not present

## 2017-09-16 ENCOUNTER — Other Ambulatory Visit: Payer: Self-pay | Admitting: Cardiology

## 2017-09-16 DIAGNOSIS — I5022 Chronic systolic (congestive) heart failure: Secondary | ICD-10-CM

## 2017-09-16 NOTE — Telephone Encounter (Signed)
Rx sent to pharmacy   

## 2017-09-21 ENCOUNTER — Telehealth: Payer: Self-pay

## 2017-09-21 NOTE — Telephone Encounter (Signed)
Copied from [place] [id number]. Topic: [facility] >> Sep 16, 2017 11:03 AM Rutherford Nail, NT wrote: Reason for CRM: Patient calling and has received the [facility] call about the diabetic testing. Would like to know if she can schedule this and include a CBC? Please advise. >> Sep 21, 2017 10:15 AM Laverna Peace D wrote: Can you please advise if there are Diabetic labs ordered prior to scheduling. Thanks      Maudie Mercury, Patient will need to have a visit for labs to be ordered last visit 05/17/16.  Please advise

## 2017-09-29 ENCOUNTER — Ambulatory Visit: Payer: Medicare HMO | Admitting: Hematology & Oncology

## 2017-09-29 ENCOUNTER — Other Ambulatory Visit: Payer: Medicare HMO

## 2017-10-01 LAB — CUP PACEART REMOTE DEVICE CHECK
Battery Remaining Longevity: 23 mo
Battery Voltage: 2.91 V
Brady Statistic AP VS Percent: 0.02 %
Brady Statistic AS VS Percent: 1.34 %
Brady Statistic RA Percent Paced: 0.06 %
Brady Statistic RV Percent Paced: 72.05 %
HIGH POWER IMPEDANCE MEASURED VALUE: 75 Ohm
Implantable Lead Implant Date: 20151030
Implantable Lead Implant Date: 20151030
Implantable Lead Model: 4598
Implantable Lead Model: 5076
Implantable Lead Model: 6935
Implantable Pulse Generator Implant Date: 20151030
Lead Channel Impedance Value: 1045 Ohm
Lead Channel Impedance Value: 399 Ohm
Lead Channel Impedance Value: 437 Ohm
Lead Channel Impedance Value: 456 Ohm
Lead Channel Impedance Value: 532 Ohm
Lead Channel Impedance Value: 665 Ohm
Lead Channel Impedance Value: 703 Ohm
Lead Channel Impedance Value: 836 Ohm
Lead Channel Impedance Value: 855 Ohm
Lead Channel Pacing Threshold Pulse Width: 0.4 ms
Lead Channel Sensing Intrinsic Amplitude: 11.875 mV
Lead Channel Sensing Intrinsic Amplitude: 11.875 mV
Lead Channel Sensing Intrinsic Amplitude: 3.625 mV
Lead Channel Setting Pacing Amplitude: 2 V
Lead Channel Setting Pacing Amplitude: 2.5 V
Lead Channel Setting Pacing Pulse Width: 0.4 ms
Lead Channel Setting Pacing Pulse Width: 1 ms
MDC IDC LEAD IMPLANT DT: 20151030
MDC IDC LEAD LOCATION: 753858
MDC IDC LEAD LOCATION: 753859
MDC IDC LEAD LOCATION: 753860
MDC IDC MSMT LEADCHNL LV IMPEDANCE VALUE: 1140 Ohm
MDC IDC MSMT LEADCHNL LV IMPEDANCE VALUE: 1159 Ohm
MDC IDC MSMT LEADCHNL LV IMPEDANCE VALUE: 532 Ohm
MDC IDC MSMT LEADCHNL LV PACING THRESHOLD AMPLITUDE: 1.875 V
MDC IDC MSMT LEADCHNL LV PACING THRESHOLD PULSEWIDTH: 1 ms
MDC IDC MSMT LEADCHNL RA PACING THRESHOLD AMPLITUDE: 0.75 V
MDC IDC MSMT LEADCHNL RA SENSING INTR AMPL: 3.625 mV
MDC IDC MSMT LEADCHNL RV IMPEDANCE VALUE: 380 Ohm
MDC IDC MSMT LEADCHNL RV PACING THRESHOLD AMPLITUDE: 0.625 V
MDC IDC MSMT LEADCHNL RV PACING THRESHOLD PULSEWIDTH: 0.4 ms
MDC IDC SESS DTM: 20190814052502
MDC IDC SET LEADCHNL RV PACING AMPLITUDE: 2 V
MDC IDC SET LEADCHNL RV SENSING SENSITIVITY: 0.3 mV
MDC IDC STAT BRADY AP VP PERCENT: 0.04 %
MDC IDC STAT BRADY AS VP PERCENT: 98.6 %

## 2017-10-07 NOTE — Telephone Encounter (Signed)
Called and spoke to pt, to sttempt to schedule an appt with Dr. Charlett Blake. Pt stated that she has an appt on 10/10/17 with Dr. Jonette Eva and was hoping that they could do her blood work at that appt. Advised pt to call back if she needed an appt with Dr. Charlett Blake.

## 2017-10-10 ENCOUNTER — Inpatient Hospital Stay: Payer: Medicare HMO | Attending: Hematology & Oncology

## 2017-10-10 ENCOUNTER — Inpatient Hospital Stay (HOSPITAL_BASED_OUTPATIENT_CLINIC_OR_DEPARTMENT_OTHER): Payer: Medicare HMO | Admitting: Hematology & Oncology

## 2017-10-10 VITALS — BP 129/61 | HR 66 | Temp 97.7°F | Resp 17 | Wt 213.5 lb

## 2017-10-10 DIAGNOSIS — I890 Lymphedema, not elsewhere classified: Secondary | ICD-10-CM | POA: Insufficient documentation

## 2017-10-10 DIAGNOSIS — Z86000 Personal history of in-situ neoplasm of breast: Secondary | ICD-10-CM | POA: Insufficient documentation

## 2017-10-10 DIAGNOSIS — Z950 Presence of cardiac pacemaker: Secondary | ICD-10-CM | POA: Diagnosis not present

## 2017-10-10 DIAGNOSIS — M810 Age-related osteoporosis without current pathological fracture: Secondary | ICD-10-CM

## 2017-10-10 DIAGNOSIS — E782 Mixed hyperlipidemia: Secondary | ICD-10-CM

## 2017-10-10 LAB — CBC WITH DIFFERENTIAL (CANCER CENTER ONLY)
Basophils Absolute: 0 K/uL (ref 0.0–0.1)
Basophils Relative: 1 %
Eosinophils Absolute: 0.4 K/uL (ref 0.0–0.5)
Eosinophils Relative: 7 %
HCT: 36.3 % (ref 34.8–46.6)
Hemoglobin: 12 g/dL (ref 11.6–15.9)
Lymphocytes Relative: 46 %
Lymphs Abs: 2.9 K/uL (ref 0.9–3.3)
MCH: 31.3 pg (ref 26.0–34.0)
MCHC: 33.1 g/dL (ref 32.0–36.0)
MCV: 94.5 fL (ref 81.0–101.0)
Monocytes Absolute: 0.5 K/uL (ref 0.1–0.9)
Monocytes Relative: 8 %
Neutro Abs: 2.4 K/uL (ref 1.5–6.5)
Neutrophils Relative %: 38 %
Platelet Count: 227 K/uL (ref 145–400)
RBC: 3.84 MIL/uL (ref 3.70–5.32)
RDW: 13.8 % (ref 11.1–15.7)
WBC Count: 6.2 K/uL (ref 3.9–10.0)

## 2017-10-10 LAB — CMP (CANCER CENTER ONLY)
ALT: 19 U/L (ref 10–47)
AST: 28 U/L (ref 11–38)
Albumin: 3.7 g/dL (ref 3.5–5.0)
Alkaline Phosphatase: 68 U/L (ref 26–84)
Anion gap: 2 — ABNORMAL LOW (ref 5–15)
BUN: 9 mg/dL (ref 7–22)
CO2: 28 mmol/L (ref 18–33)
Calcium: 9.3 mg/dL (ref 8.0–10.3)
Chloride: 109 mmol/L — ABNORMAL HIGH (ref 98–108)
Creatinine: 1.2 mg/dL (ref 0.60–1.20)
Glucose, Bld: 92 mg/dL (ref 73–118)
Potassium: 3.7 mmol/L (ref 3.3–4.7)
Sodium: 139 mmol/L (ref 128–145)
Total Bilirubin: 0.5 mg/dL (ref 0.2–1.6)
Total Protein: 7.5 g/dL (ref 6.4–8.1)

## 2017-10-10 NOTE — Progress Notes (Signed)
Hematology and Oncology Follow Up Visit  Brenda Marsh [id number] 01/27/1945 72 y.o. 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 She comes back for her yearly visit. She is doing quite well. She really has had no problems since we last saw her. Thankfully, her heart has been doing okay. She has a pacemaker in. She's had a pacemaker for 4 years.  She has a lymphedema in her left arm. She is doing okay with this. I am not sure if she wears any type of compression sleeve for her left arm.   She's had no problems with nausea or vomiting. She's had no cough. His been no shortness of breath.   She's had no change in bowel or bladder habits.   Her last mammogram was back on 08/13/2016. This looked fine.   Overall, her performance status is ECOG 0.   Medications:  Allergies as of 10/10/2017      Reactions   Contrast Media [iodinated Diagnostic Agents] Nausea And Vomiting   N&V   Crestor [rosuvastatin] Other (See Comments)   Myalgia, weakness   Metrizamide Nausea And Vomiting   Atorvastatin Other (See Comments)   Myalgia, weakness   Lisinopril Other (See Comments)   REACTION: cough   Naproxen Sodium Other (See Comments)   REACTION: Breathing difficulty   Penicillins Other (See Comments)   Shellfish Allergy Other (See Comments)   Cant breath   Simvastatin Other (See Comments)   REACTION: muscle aches      Medication List        Accurate as of 10/10/17  3:37 PM. Always use your most recent med list.          bisoprolol 5 MG tablet Commonly known as:  ZEBETA Take 0.5 tablets (2.5 mg total) by mouth daily.   cetirizine 10 MG tablet Commonly known as:  ZYRTEC Take 10 mg by mouth daily.   EPINEPHrine 0.3 mg/0.3 mL Soaj injection Commonly known as:  EPI-PEN Use as directed for severe allergic reaction     furosemide 20 MG tablet Commonly known as:  LASIX TAKE ONE TABLET DAILY AS NEEDED FOR SWELLING   meclizine 12.5 MG tablet Commonly known as:  ANTIVERT Take 1 tablet (12.5 mg total) by mouth 3 (three) times daily as needed for dizziness.   multivitamin capsule Take 1 capsule by mouth at bedtime.   pantoprazole 20 MG tablet Commonly known as:  PROTONIX Take 20 mg by mouth 2 (two) times daily.   PROAIR HFA 108 (90 Base) MCG/ACT inhaler Generic drug:  albuterol Inhale 2 puffs into the lungs every 4 (four) hours as needed for wheezing or shortness of breath.   ranitidine 75 MG tablet Commonly known as:  ZANTAC Take 75 mg by mouth 2 (two) times daily.   spironolactone 25 MG tablet Commonly known as:  ALDACTONE TAKE 1/2 TABLET BY MOUTH DAILY   vitamin C 500 MG tablet Commonly known as:  ASCORBIC ACID Take 500 mg by mouth daily.   Vitamin D 400 units capsule Take 400 Units by mouth daily.       Allergies:   Past Medical History, Surgical history, Social history, and Family History were reviewed and updated.  Review of Systems: Review of Systems  Constitutional: Negative.   HENT: Negative.   Eyes: Negative.   Respiratory: Negative.   Cardiovascular:  Negative.   Gastrointestinal: Negative.   Genitourinary: Negative.   Musculoskeletal: Negative.   Skin: Negative.   Neurological: Negative.   Endo/Heme/Allergies: Negative.   Psychiatric/Behavioral: Negative.      Physical Exam:  weight is 213 lb 8 oz (96.8 kg). Her oral temperature is 97.7 F (36.5 C). Her blood pressure is 129/61 and her pulse is 66. Her respiration is 17.   Wt Readings from Last 3 Encounters:  10/10/17 213 lb 8 oz (96.8 kg)  05/25/17 209 lb 12.8 oz (95.2 kg)  03/11/17 207 lb (93.9 kg)    Obese African-American female in no obvious distress. Head and neck exam shows no ocular or oral lesions. She has no adenopathy in the neck. There is no palpable thyroid. Lungs are clear bilaterally. Cardiac  exam regular rate and rhythm with no murmurs, rubs or bruits. Breast exam shows right breast with no masses, edema or erythema. There is no right axillary adenopathy. Left breast is contracted from radiation and surgery. She has a well-healed lumpectomy scar at the 2:00 position. There is firmness at the lumpectomy scar. No distinct masses noted in the left breast. There is no left axillary adenopathy. Abdomen is soft. Abdomen is obese. She has good bowel sounds. There is no fluid wave. There is no palpable liver or spleen tip. Back exam shows no tenderness over the spine, ribs or hips. Externally shows no clubbing, cyanosis or edema. She does have the lymphedema in the left arm. Neurological exam shows no focal neurological deficits.  Lab Results  Component Value Date   WBC 6.2 10/10/2017   HGB 12.0 10/10/2017   HCT 36.3 10/10/2017   MCV 94.5 10/10/2017   PLT 227 10/10/2017   Lab Results  Component Value Date   IRON 56 12/21/2012   TIBC 429 12/21/2012   UIBC 373 12/21/2012   IRONPCTSAT 13 (L) 12/21/2012   Lab Results  Component Value Date   RBC 3.84 10/10/2017   No results found for: KPAFRELGTCHN, LAMBDASER, KAPLAMBRATIO No results found for: IGGSERUM, IGA, IGMSERUM No results found for: Odetta Pink, SPEI   Chemistry      Component Value Date/Time   NA 139 10/10/2017 1436   NA 143 05/25/2017 1210   NA 143 09/29/2016 1500   K 3.7 10/10/2017 1436   K 3.8 09/29/2016 1500   CL 109 (H) 10/10/2017 1436   CL 105 09/29/2016 1500   CO2 28 10/10/2017 1436   CO2 28 09/29/2016 1500   BUN 9 10/10/2017 1436   BUN 9 05/25/2017 1210   BUN 12 09/29/2016 1500   CREATININE 1.20 10/10/2017 1436   CREATININE 0.9 09/29/2016 1500      Component Value Date/Time   CALCIUM 9.3 10/10/2017 1436   CALCIUM 9.7 09/29/2016 1500   ALKPHOS 68 10/10/2017 1436   ALKPHOS 81 09/29/2016 1500   AST 28 10/10/2017 1436   ALT 19 10/10/2017 1436   ALT 23  09/29/2016 1500   BILITOT 0.5 10/10/2017 1436     Impression and Plan: Brenda Marsh is a 72 year old African-American female with stage IIA ductal carcinoma the left breast. She had 1 lymph node positive. Her tumor was ER positive. She was on tamoxifen.  She is approximate 17 years out from treatment.  We will continue to follow her along yearly.  I am so glad that her heart is doing okay.  I am does have a that there was no problems with her heart since we last saw  her.   [name], MD 9/23/20193:37 PM

## 2017-10-11 LAB — VITAMIN D 25 HYDROXY (VIT D DEFICIENCY, FRACTURES): Vit D, 25-Hydroxy: 46.2 ng/mL (ref 30.0–100.0)

## 2017-10-29 ENCOUNTER — Other Ambulatory Visit: Payer: Self-pay

## 2017-10-29 ENCOUNTER — Encounter (HOSPITAL_BASED_OUTPATIENT_CLINIC_OR_DEPARTMENT_OTHER): Payer: Self-pay | Admitting: Emergency Medicine

## 2017-10-29 ENCOUNTER — Emergency Department (HOSPITAL_BASED_OUTPATIENT_CLINIC_OR_DEPARTMENT_OTHER): Payer: Medicare HMO

## 2017-10-29 ENCOUNTER — Emergency Department (HOSPITAL_BASED_OUTPATIENT_CLINIC_OR_DEPARTMENT_OTHER)
Admission: EM | Admit: 2017-10-29 | Discharge: 2017-10-29 | Disposition: A | Discharge status: Home / Self Care | Payer: Medicare HMO | Attending: Emergency Medicine | Admitting: Emergency Medicine

## 2017-10-29 DIAGNOSIS — Z87891 Personal history of nicotine dependence: Secondary | ICD-10-CM | POA: Insufficient documentation

## 2017-10-29 DIAGNOSIS — E119 Type 2 diabetes mellitus without complications: Secondary | ICD-10-CM | POA: Diagnosis not present

## 2017-10-29 DIAGNOSIS — S42201A Unspecified fracture of upper end of right humerus, initial encounter for closed fracture: Secondary | ICD-10-CM | POA: Diagnosis not present

## 2017-10-29 DIAGNOSIS — I5042 Chronic combined systolic (congestive) and diastolic (congestive) heart failure: Secondary | ICD-10-CM | POA: Diagnosis not present

## 2017-10-29 DIAGNOSIS — W109XXA Fall (on) (from) unspecified stairs and steps, initial encounter: Secondary | ICD-10-CM | POA: Insufficient documentation

## 2017-10-29 DIAGNOSIS — S4991XA Unspecified injury of right shoulder and upper arm, initial encounter: Secondary | ICD-10-CM | POA: Diagnosis present

## 2017-10-29 DIAGNOSIS — Z79899 Other long term (current) drug therapy: Secondary | ICD-10-CM | POA: Insufficient documentation

## 2017-10-29 DIAGNOSIS — Y9301 Activity, walking, marching and hiking: Secondary | ICD-10-CM | POA: Insufficient documentation

## 2017-10-29 DIAGNOSIS — M25511 Pain in right shoulder: Secondary | ICD-10-CM | POA: Diagnosis not present

## 2017-10-29 DIAGNOSIS — R52 Pain, unspecified: Secondary | ICD-10-CM | POA: Diagnosis not present

## 2017-10-29 DIAGNOSIS — S42291A Other displaced fracture of upper end of right humerus, initial encounter for closed fracture: Secondary | ICD-10-CM | POA: Diagnosis not present

## 2017-10-29 DIAGNOSIS — J45909 Unspecified asthma, uncomplicated: Secondary | ICD-10-CM | POA: Diagnosis not present

## 2017-10-29 DIAGNOSIS — R1111 Vomiting without nausea: Secondary | ICD-10-CM | POA: Diagnosis not present

## 2017-10-29 DIAGNOSIS — Y929 Unspecified place or not applicable: Secondary | ICD-10-CM | POA: Diagnosis not present

## 2017-10-29 DIAGNOSIS — I10 Essential (primary) hypertension: Secondary | ICD-10-CM | POA: Diagnosis not present

## 2017-10-29 DIAGNOSIS — Y999 Unspecified external cause status: Secondary | ICD-10-CM | POA: Diagnosis not present

## 2017-10-29 DIAGNOSIS — M25519 Pain in unspecified shoulder: Secondary | ICD-10-CM | POA: Diagnosis not present

## 2017-10-29 DIAGNOSIS — Z853 Personal history of malignant neoplasm of breast: Secondary | ICD-10-CM | POA: Diagnosis not present

## 2017-10-29 DIAGNOSIS — R110 Nausea: Secondary | ICD-10-CM | POA: Diagnosis not present

## 2017-10-29 MED ORDER — HYDROMORPHONE HCL 1 MG/ML IJ SOLN
1.0000 mg | Freq: Once | INTRAMUSCULAR | Status: AC
  Administered 2017-10-29: 1 mg via INTRAVENOUS
  Filled 2017-10-29: qty 1

## 2017-10-29 MED ORDER — HYDROCODONE-ACETAMINOPHEN 5-325 MG PO TABS: 1.0000 | ORAL_TABLET | Freq: Four times a day (QID) | ORAL | 0 refills | Status: DC | PRN

## 2017-10-29 MED ORDER — PROMETHAZINE HCL 25 MG PO TABS: 25.0000 mg | ORAL_TABLET | Freq: Three times a day (TID) | ORAL | 1 refills | Status: DC | PRN

## 2017-10-29 MED ORDER — ONDANSETRON 4 MG PO TBDP: 4.0000 mg | ORAL_TABLET | Freq: Three times a day (TID) | ORAL | 1 refills | Status: DC | PRN

## 2017-10-29 MED ORDER — PROMETHAZINE HCL 25 MG/ML IJ SOLN
12.5000 mg | Freq: Once | INTRAMUSCULAR | Status: AC
  Administered 2017-10-29: 12.5 mg via INTRAVENOUS
  Filled 2017-10-29: qty 1

## 2017-10-29 MED ORDER — ONDANSETRON HCL 4 MG/2ML IJ SOLN
4.0000 mg | Freq: Once | INTRAMUSCULAR | Status: AC
  Administered 2017-10-29: 4 mg via INTRAVENOUS
  Filled 2017-10-29: qty 2

## 2017-10-29 NOTE — ED Notes (Signed)
Pt vomiting. RN aware.

## 2017-10-29 NOTE — ED Provider Notes (Signed)
[facility] EMERGENCY DEPARTMENT Provider Note   CSN: [id number] Arrival date & time: 10/29/17  1610     History   Chief Complaint Chief Complaint  Patient presents with  . Fall    HPI Brenda Marsh is a 72 y.o. female.  Patient brought in by EMS.  Patient had a fall shortly prior to arrival she tripped and fell down 2 steps while eating out today.  Patient denies any neck or back pain also did not hit her head.  Patient's only complaint is her right shoulder.  It was 10 out of 10 and EMS gave her 100 mcg of fentanyl and 4 mg of Zofran.  Patient still in a lot of pain.  Patient is not on blood thinners.  Patient does have a pacemaker.  There was no syncope or loss of consciousness.     Past Medical History:  Diagnosis Date  . AICD (automatic cardioverter/defibrillator) present   . Allergic state 05/17/2016   Dr Ernst Bowler asthma and allergy specialist.  . Allergy   . Arthritis    left knee pain, MRI in 2008-tricompartmental  degenerate changes, mucoid degeneration of ACL, post horn meniscal tear and ant horn meniscal tear  . Asthma   . Breast cancer in female [facility]) 2000   left; S/P lumpectomy, radiation and chemotherapy  . CHF (congestive heart failure) ([place])   . Colon polyp    unclear pathology  . Hyperlipidemia   . Hypertension   . LBBB (left bundle branch block)   . Low back pain    left L3-4 injected  . Lymphedema of left arm   . Nonischemic cardiomyopathy ([place])   . Presence of permanent cardiac pacemaker   . Preventative health care 05/18/2016  . Travel sickness 05/12/2015  . Unspecified constipation 04/22/2012  . Vertigo     Patient Active Problem List   Diagnosis Date Noted  . Preventative health care 05/18/2016  . Skin mole 05/18/2016  . Allergic state 05/17/2016  . Cough due to bronchospasm 02/11/2016  . Morbid obesity ([place]) 02/11/2016  . Syncope 01/21/2016  . Nausea 05/12/2015  . Benign paroxysmal positional vertigo 12/01/2014  .  Nonischemic cardiomyopathy ([place])   . ICD (implantable cardioverter-defibrillator), biventricular, in situ 02/26/2014  . Chronic combined systolic and diastolic CHF (congestive heart failure) ([place]) 11/16/2013  . Bilateral carotid artery disease ([place])   . Anemia 07/16/2012  . Hypokalemia 06/26/2012  . GERD 07/02/2009  . Hyperlipidemia, mixed 03/11/2009  . Essential hypertension 03/11/2009  . Osteoarthritis 03/11/2009  . Diabetes mellitus type 2, diet-controlled ([place]) 03/11/2009  . History of breast cancer 03/11/2009    Past Surgical History:  Procedure Laterality Date  . APPENDECTOMY  2009   Dr. Lucia Gaskins  . BI-VENTRICULAR IMPLANTABLE CARDIOVERTER DEFIBRILLATOR N/A 11/16/2013   Procedure: BI-VENTRICULAR IMPLANTABLE CARDIOVERTER DEFIBRILLATOR  (CRT-D);  Surgeon: Evans Lance, MD;  Location: [facility] C[facility] LAB;  Service: Cardiovascular;  Laterality: N/A;  . BI-VENTRICULAR IMPLANTABLE CARDIOVERTER DEFIBRILLATOR  (CRT-D)  11/16/13   MDT CRTD implanted by Dr Lovena Le for NICM, CHF, and LBBB  . BREAST BIOPSY Left 2000  . BREAST LUMPECTOMY Left 2000  . CARDIAC CATHETERIZATION  ~ 2010   "no blockage"  . CARPAL TUNNEL RELEASE Bilateral 2007   Dr. Daylene Katayama  . COLONOSCOPY    . KNEE ARTHROSCOPY Right   . POLYPECTOMY    . TONSILLECTOMY    . TRIGGER FINGER RELEASE Bilateral 2007  . VAGINAL HYSTERECTOMY     Paritial     OB  History   None      Home Medications    Prior to Admission medications   Medication Sig Start Date End Date Taking? Authorizing Provider  bisoprolol (ZEBETA) 5 MG tablet Take 0.5 tablets (2.5 mg total) by mouth daily. 05/25/17  Yes Lelon Perla, MD  spironolactone (ALDACTONE) 25 MG tablet TAKE 1/2 TABLET BY MOUTH DAILY 09/16/17  Yes Lelon Perla, MD  albuterol (PROAIR HFA) 108 (90 Base) MCG/ACT inhaler Inhale 2 puffs into the lungs every 4 (four) hours as needed for wheezing or shortness of breath.    [provider]  cetirizine (ZYRTEC) 10 MG tablet Take 10 mg  by mouth daily.    [provider]  Cholecalciferol (VITAMIN D) 400 UNITS capsule Take 400 Units by mouth daily.    [provider]  EPINEPHrine 0.3 mg/0.3 mL IJ SOAJ injection Use as directed for severe allergic reaction 02/21/16   Valentina Shaggy, MD  furosemide (LASIX) 20 MG tablet TAKE ONE TABLET DAILY AS NEEDED FOR SWELLING 05/25/17   Lelon Perla, MD  HYDROcodone-acetaminophen (NORCO/VICODIN) 5-325 MG tablet Take 1-2 tablets by mouth every 6 (six) hours as needed for moderate pain. 10/29/17   Fredia Sorrow, MD  meclizine (ANTIVERT) 12.5 MG tablet Take 1 tablet (12.5 mg total) by mouth 3 (three) times daily as needed for dizziness. 04/08/17   [place], Holli Humbles, NP  Multiple Vitamin (MULTIVITAMIN) capsule Take 1 capsule by mouth at bedtime.     [provider]  ondansetron (ZOFRAN ODT) 4 MG disintegrating tablet Take 1 tablet (4 mg total) by mouth every 8 (eight) hours as needed. 10/29/17   Fredia Sorrow, MD  pantoprazole (PROTONIX) 20 MG tablet Take 20 mg by mouth 2 (two) times daily.    [provider]  promethazine (PHENERGAN) 25 MG tablet Take 1 tablet (25 mg total) by mouth every 8 (eight) hours as needed for nausea or vomiting. 10/29/17   Fredia Sorrow, MD  ranitidine (ZANTAC) 75 MG tablet Take 75 mg by mouth 2 (two) times daily.    [provider]  vitamin C (ASCORBIC ACID) 500 MG tablet Take 500 mg by mouth daily.    [provider]    Family History Family History  Problem Relation Age of Onset  . Heart attack Mother 37  . Diabetes Sister   . Diabetes Brother   . Coronary artery disease Other        Female first degree relative <60  . Hyperlipidemia Other   . Hypertension Other   . Cancer Other        prostate, 1st degree relative <50  . Colon cancer Maternal Aunt   . Allergic rhinitis Neg Hx   . Angioedema Neg Hx   . Asthma Neg Hx   . Eczema Neg Hx   . Immunodeficiency Neg Hx   . Urticaria Neg Hx   .  Stomach cancer Neg Hx   . Rectal cancer Neg Hx   . Esophageal cancer Neg Hx   . Liver cancer Neg Hx   . Colon polyps Neg Hx     Social History Social History   Tobacco Use  . Smoking status: Former Smoker    Packs/day: 0.10    Years: 2.00    Pack years: 0.20    Types: Cigarettes    Last attempt to quit: 01/18/1970    Years since quitting: 47.8  . Smokeless tobacco: Never Used  . Tobacco comment: "smoked maybe 1 pack/month when I  did smoke  Substance Use Topics  . Alcohol use: No  . Drug use: No     Allergies   Contrast media [iodinated diagnostic agents]; Crestor [rosuvastatin]; Metrizamide; Atorvastatin; Lisinopril; Naproxen sodium; Penicillins; Shellfish allergy; and Simvastatin   Review of Systems Review of Systems  Constitutional: Negative for fever.  HENT: Negative for congestion.   Eyes: Negative for visual disturbance.  Respiratory: Negative for shortness of breath.   Cardiovascular: Negative for chest pain.  Gastrointestinal: Positive for nausea. Negative for abdominal pain.  Genitourinary: Negative for dysuria.  Musculoskeletal: Negative for back pain and neck pain.  Skin: Negative for wound.  Neurological: Negative for syncope and headaches.  Hematological: Does not bruise/bleed easily.  Psychiatric/Behavioral: Negative for confusion.     Physical Exam Updated Vital Signs BP (!) 148/110   Pulse 90   Temp 98.3 F (36.8 C) (Oral)   Resp (!) 23   Ht 1.626 m (5\' 4" )   Wt 96.6 kg   SpO2 100%   BMI 36.56 kg/m   Physical Exam  Constitutional: She is oriented to person, place, and time. She appears well-developed and well-nourished.  HENT:  Head: Normocephalic and atraumatic.  Mouth/Throat: Oropharynx is clear and moist.  Eyes: Pupils are equal, round, and reactive to light. Conjunctivae and EOM are normal.  Neck: Normal range of motion. Neck supple.  Cardiovascular: Normal rate, regular rhythm and normal heart sounds.  Pulmonary/Chest: Effort  normal and breath sounds normal.  Abdominal: Soft. Bowel sounds are normal. There is no tenderness.  Musculoskeletal: Normal range of motion. She exhibits tenderness. She exhibits no edema or deformity.  Is around the right clavicle and right shoulder area no obvious deformity or evidence of dislocation.  Distally station intact good movement of the fingers.  No tenderness at elbow wrist or forearm.  Radial pulse is 1-2+.  Good cap refill.  Neurological: She is alert and oriented to person, place, and time. No cranial nerve deficit or sensory deficit. She exhibits normal muscle tone. Coordination normal.  Skin: Skin is warm.  Nursing note and vitals reviewed.    ED Treatments / Results  Labs (all labs ordered are listed, but only abnormal results are displayed) Labs Reviewed - No data to display  EKG EKG Interpretation  Date/Time:  Saturday October 29 2017 17:50:03 EDT Ventricular Rate:  91 PR Interval:    QRS Duration: 113 QT Interval:  392 QTC Calculation: 483 R Axis:   76 Text Interpretation:  Sinus rhythm Consider left atrial enlargement Borderline intraventricular conduction delay Low voltage, extremity leads Abnormal T, consider ischemia, diffuse leads Confirmed by Fredia Sorrow [phone]) on 10/29/2017 5:55:03 PM   Radiology Dg Shoulder Right  Result Date: 10/29/2017 CLINICAL DATA:  Fall today, RIGHT shoulder pain. EXAM: RIGHT SHOULDER - 2+ VIEW COMPARISON:  None. FINDINGS: Displaced/comminuted fracture of the RIGHT humeral head. Suspect extension to the RIGHT humeral neck. Major component of the humeral head appears to be appropriately positioned relative to the glenoid fossa. No scapular fracture identified. Acromioclavicular joint space appears normally aligned. IMPRESSION: Displaced/comminuted fracture of the RIGHT humeral head. Probable involvement of the RIGHT humeral neck. Electronically Signed   By: Franki Cabot M.D.   On: 10/29/2017 17:30    Procedures Procedures  (including critical care time)  Medications Ordered in ED Medications  ondansetron (ZOFRAN) injection 4 mg (4 mg Intravenous Given 10/29/17 1645)  HYDROmorphone (DILAUDID) injection 1 mg (1 mg Intravenous Given 10/29/17 1644)  promethazine (PHENERGAN) injection 12.5 mg (12.5 mg Intravenous Given 10/29/17  1759)     Initial Impression / Assessment and Plan / ED Course  I have reviewed the triage vital signs and the nursing notes.  Pertinent labs & imaging results that were available during my care of the patient were reviewed by me and considered in my medical decision making (see chart for details).    Patient with fall no loss of consciousness injury seems to be isolated to the right shoulder x-rays show a displaced comminuted fracture proximal humerus.  Also involving the neck of the humerus.  No obvious dislocation at the glenoid fossa.  Patient treated with a sling.  Difficulty controlling pain.  Patient has had a lot of trouble with nausea with pain medicine.  Patient initially treated with fentanyl then received 1 mg of Dilaudid also received Zofran x2 and also received Phenergan.  Patient did have EKG done since she does have a pacemaker on the right side.  Wires appear to be intact pacemaker leads based on EKG without any abnormalities.  Patient has been followed by [facility] in the past.  She will call for follow-up and she will keep the sling in place.  Will require pain control.  Patient initially given hydrocodone and Phenergan and Zofran for the antinausea problem.  However just informed by nursing that patient does not want to hydrocodone even though she was in agreement with that we will go back and rediscussed see what pain medicine she wants.   Final Clinical Impressions(s) / ED Diagnoses   Final diagnoses:  Other closed displaced fracture of proximal end of right humerus, initial encounter    ED Discharge Orders         Ordered    HYDROcodone-acetaminophen  (NORCO/VICODIN) 5-325 MG tablet  Every 6 hours PRN     10/29/17 1831    ondansetron (ZOFRAN ODT) 4 MG disintegrating tablet  Every 8 hours PRN     10/29/17 1831    promethazine (PHENERGAN) 25 MG tablet  Every 8 hours PRN     10/29/17 1831           Fredia Sorrow, MD 10/29/17 1844

## 2017-10-29 NOTE — ED Notes (Signed)
Pt on auto VS  

## 2017-10-29 NOTE — ED Notes (Signed)
Patient transported to X-ray 

## 2017-10-29 NOTE — ED Notes (Signed)
ED Provider at bedside. 

## 2017-10-29 NOTE — Discharge Instructions (Addendum)
X-rays show evidence of a displaced proximal humerus fracture.  Following up with [facility] will be important.  Call them on Monday for appointment time.  Keep the right arm in the sling in the meantime.  Take the hydrocodone as needed for pain take the Phenergan and Zofran for nausea.  Can take both together.  Return for any new or worse symptoms.  Also you will need help at home somebody will need to stay with you for the next several days or maybe even 2 weeks.  Since she will not be able to use your right arm at all.

## 2017-10-29 NOTE — ED Triage Notes (Signed)
Pt tripped and fell down 2 steps while eating out today. Denies neck or back pain. Pt c/o R shoulder pain 10/10 166mcg fentanyl and 4mg  zofran given by EMS PTA.

## 2017-11-02 DIAGNOSIS — S42291A Other displaced fracture of upper end of right humerus, initial encounter for closed fracture: Secondary | ICD-10-CM | POA: Diagnosis not present

## 2017-11-10 ENCOUNTER — Ambulatory Visit: Payer: Self-pay

## 2017-11-10 NOTE — Telephone Encounter (Addendum)
Pt daughter called after her mother C/O burning pain to rt lower leg. She her mother fell on the 12th and FX her humerus and is being treated by an orthopedic. Last night her mother was awaken from sleep with severe burning pain to her Rt thigh. Daughter states that there is a hard lump in the area. No additional swelling to the lower extremity. Per protocol pt will be seen today but in the ED because no appointments are available with PCP. Other offices were also checked for availability that were suggested by daughter but no appointment were available. Appointment scheduled for tomorrow 11/11/17 with office per daughter request. Daughter states she will cancel if not needed. Care advice read to daughter. Daughter verbalized understanding.  Reason for Disposition . [1] Thigh or calf pain AND [2] only 1 side AND [3] present > 1 hour  Answer Assessment - Initial Assessment Questions 1. ONSET: "When did the pain start?"      Today last night pain woke her up 2. LOCATION: "Where is the pain located?"      Rt calf after fall 12th 3. PAIN: "How bad is the pain?"    (Scale 1-10; or mild, moderate, severe)   -  MILD (1-3): doesn't interfere with normal activities    -  MODERATE (4-7): interferes with normal activities (e.g., work or school) or awakens from sleep, limping    -  SEVERE (8-10): excruciating pain, unable to do any normal activities, unable to walk     moderate 4. WORK OR EXERCISE: "Has there been any recent work or exercise that involved this part of the body?"      no 5. CAUSE: "What do you think is causing the leg pain?"     fall 6. OTHER SYMPTOMS: "Do you have any other symptoms?" (e.g., chest pain, back pain, breathing difficulty, swelling, rash, fever, numbness, weakness)     fx humerus rt arm 7. PREGNANCY: "Is there any chance you are pregnant?" "When was your last menstrual period?"     N/A  Protocols used: LEG PAIN-A-AH

## 2017-11-11 ENCOUNTER — Ambulatory Visit (INDEPENDENT_AMBULATORY_CARE_PROVIDER_SITE_OTHER): Payer: Medicare HMO | Admitting: Medical

## 2017-11-11 ENCOUNTER — Encounter: Payer: Self-pay | Admitting: Medical

## 2017-11-11 ENCOUNTER — Ambulatory Visit (HOSPITAL_BASED_OUTPATIENT_CLINIC_OR_DEPARTMENT_OTHER)
Admission: RE | Admit: 2017-11-11 | Discharge: 2017-11-11 | Disposition: A | Discharge status: Home / Self Care | Payer: Medicare HMO | Source: Ambulatory Visit | Attending: Medical | Admitting: Medical

## 2017-11-11 VITALS — HR 79 | Temp 98.3°F | Resp 16 | Ht 65.0 in | Wt 218.0 lb

## 2017-11-11 DIAGNOSIS — M79604 Pain in right leg: Secondary | ICD-10-CM | POA: Insufficient documentation

## 2017-11-11 DIAGNOSIS — R600 Localized edema: Secondary | ICD-10-CM | POA: Diagnosis not present

## 2017-11-11 DIAGNOSIS — S8991XA Unspecified injury of right lower leg, initial encounter: Secondary | ICD-10-CM | POA: Diagnosis not present

## 2017-11-11 NOTE — Progress Notes (Signed)
   Subjective:    Patient ID: MARAH PARK, female    DOB: 10/24/1945, 72 y.o.   MRN: [id number]  HPI  Pt had a fall on 10-29-2017. fx humerus. Pt has a sling on. She slipped/fell at [employer].  Just yesterday she had lateral aspect calf pain. Also some pain in popliteal area. No hx of dvt.  Pt was given tramadol for her rt humerus pain. She is not using it much. No adverse side effects reported.  No bp done today since lymphadema  left arm and rt arm fx. No sob/dypsnea report. No chest pain.    Review of Systems     Objective:   Physical Exam  General- No acute distress. Pleasant patient. Neck- Full range of motion, no jvd Lungs- Clear, even and unlabored. Heart- regular rate and rhythm. Neurologic- CNII- XII grossly intact.   Rt lower ext- knee good rom no pain.faint crepitus. Rt mid fibular/lateral calf area direct tender to palpation.(mid region) Rt ankle- no pain on palpation. Rt popliteal area- mild buldge on palpation. Faint+ homan sign. Skin of lower ext looks normal. No redness or warmth. No breakdown.      Assessment & Plan:  [id number] Daughter Wanda Plump do appear to have mild to moderate tenderness to palpation in the lateral mid calf area overlying the mid fibular region.  States she did have a recent fall I do think it is best to get x-ray of the tibia/fibular area and make sure no fracture.  I think that will likely come back negative and you might have just muscle pain in that region.  You could use Tylenol as we discussed and if no fracture seen using small ace compression bandage may be helpful.  Examine skin daily and notes any changes to skin coloration or temperature change.  Regarding right lower extremity popliteal region bulge and faint pain on Homans testing, I did place ultrasound order of right lower extremity.  Will rule out DVT.  With faint slight bulge in that region might have a Baker's cyst.  We will let you know by calling or  my chart if that is establish.  Follow-up in 7 to 10 days or as needed.

## 2017-11-11 NOTE — Patient Instructions (Signed)
You do appear to have mild to moderate tenderness to palpation in the lateral mid calf area overlying the mid fibular region.  States she did have a recent fall I do think it is best to get x-ray of the tibia/fibular area and make sure no fracture.  I think that will likely come back negative and you might have just muscle pain in that region.  You could use Tylenol as we discussed and if no fracture seen using small ace compression bandage may be helpful.  Examine skin daily and notes any changes to skin coloration or temperature change.  Regarding right lower extremity popliteal region bulge and faint pain on Homans testing, I did place ultrasound order of right lower extremity.  Will rule out DVT.  With faint slight bulge in that region might have a Baker's cyst.  We will let you know by calling or my chart if that is establish.  Follow-up in 7 to 10 days or as needed.

## 2017-11-21 DIAGNOSIS — S42291A Other displaced fracture of upper end of right humerus, initial encounter for closed fracture: Secondary | ICD-10-CM | POA: Diagnosis not present

## 2017-11-30 ENCOUNTER — Ambulatory Visit (INDEPENDENT_AMBULATORY_CARE_PROVIDER_SITE_OTHER): Payer: Medicare HMO | Admitting: *Deleted

## 2017-11-30 DIAGNOSIS — I5022 Chronic systolic (congestive) heart failure: Secondary | ICD-10-CM

## 2017-11-30 DIAGNOSIS — I428 Other cardiomyopathies: Secondary | ICD-10-CM

## 2017-11-30 NOTE — Progress Notes (Signed)
Remote ICD transmission.   

## 2017-12-06 DIAGNOSIS — M25511 Pain in right shoulder: Secondary | ICD-10-CM | POA: Diagnosis not present

## 2017-12-06 DIAGNOSIS — S42294D Other nondisplaced fracture of upper end of right humerus, subsequent encounter for fracture with routine healing: Secondary | ICD-10-CM | POA: Diagnosis not present

## 2017-12-08 DIAGNOSIS — M25511 Pain in right shoulder: Secondary | ICD-10-CM | POA: Diagnosis not present

## 2017-12-08 DIAGNOSIS — S42294D Other nondisplaced fracture of upper end of right humerus, subsequent encounter for fracture with routine healing: Secondary | ICD-10-CM | POA: Diagnosis not present

## 2017-12-12 ENCOUNTER — Telehealth: Payer: Self-pay

## 2017-12-12 NOTE — Telephone Encounter (Signed)
Left message with the patients daughter- she stated she would have her mother call back to schedule Medicare AWV

## 2017-12-13 DIAGNOSIS — S42294D Other nondisplaced fracture of upper end of right humerus, subsequent encounter for fracture with routine healing: Secondary | ICD-10-CM | POA: Diagnosis not present

## 2017-12-13 DIAGNOSIS — M25511 Pain in right shoulder: Secondary | ICD-10-CM | POA: Diagnosis not present

## 2017-12-20 DIAGNOSIS — M25511 Pain in right shoulder: Secondary | ICD-10-CM | POA: Diagnosis not present

## 2017-12-20 DIAGNOSIS — S42294D Other nondisplaced fracture of upper end of right humerus, subsequent encounter for fracture with routine healing: Secondary | ICD-10-CM | POA: Diagnosis not present

## 2017-12-27 DIAGNOSIS — S42294D Other nondisplaced fracture of upper end of right humerus, subsequent encounter for fracture with routine healing: Secondary | ICD-10-CM | POA: Diagnosis not present

## 2017-12-27 DIAGNOSIS — M25511 Pain in right shoulder: Secondary | ICD-10-CM | POA: Diagnosis not present

## 2018-01-06 DIAGNOSIS — S42291A Other displaced fracture of upper end of right humerus, initial encounter for closed fracture: Secondary | ICD-10-CM | POA: Diagnosis not present

## 2018-01-23 DIAGNOSIS — S42294D Other nondisplaced fracture of upper end of right humerus, subsequent encounter for fracture with routine healing: Secondary | ICD-10-CM | POA: Diagnosis not present

## 2018-01-23 DIAGNOSIS — M25511 Pain in right shoulder: Secondary | ICD-10-CM | POA: Diagnosis not present

## 2018-01-29 LAB — CUP PACEART REMOTE DEVICE CHECK
Battery Remaining Longevity: 21 mo
Battery Voltage: 2.91 V
Brady Statistic AP VP Percent: 0.04 %
Brady Statistic AP VS Percent: 0.01 %
Brady Statistic AS VP Percent: 98.62 %
Brady Statistic AS VS Percent: 1.32 %
Brady Statistic RA Percent Paced: 0.06 %
Brady Statistic RV Percent Paced: 78.02 %
Date Time Interrogation Session: 20191113052205
HighPow Impedance: 68 Ohm
Implantable Lead Implant Date: 20151030
Implantable Lead Implant Date: 20151030
Implantable Lead Implant Date: 20151030
Implantable Lead Location: 753858
Implantable Lead Location: 753859
Implantable Lead Location: 753860
Implantable Lead Model: 4598
Implantable Lead Model: 5076
Implantable Lead Model: 6935
Implantable Pulse Generator Implant Date: 20151030
Lead Channel Impedance Value: 1178 Ohm
Lead Channel Impedance Value: 1273 Ohm
Lead Channel Impedance Value: 1349 Ohm
Lead Channel Impedance Value: 380 Ohm
Lead Channel Impedance Value: 437 Ohm
Lead Channel Impedance Value: 437 Ohm
Lead Channel Impedance Value: 456 Ohm
Lead Channel Impedance Value: 589 Ohm
Lead Channel Impedance Value: 646 Ohm
Lead Channel Impedance Value: 779 Ohm
Lead Channel Impedance Value: 836 Ohm
Lead Channel Impedance Value: 912 Ohm
Lead Channel Impedance Value: 969 Ohm
Lead Channel Pacing Threshold Amplitude: 0.5 V
Lead Channel Pacing Threshold Amplitude: 0.875 V
Lead Channel Pacing Threshold Amplitude: 1.875 V
Lead Channel Pacing Threshold Pulse Width: 0.4 ms
Lead Channel Pacing Threshold Pulse Width: 0.4 ms
Lead Channel Pacing Threshold Pulse Width: 1 ms
Lead Channel Sensing Intrinsic Amplitude: 16 mV
Lead Channel Sensing Intrinsic Amplitude: 16 mV
Lead Channel Sensing Intrinsic Amplitude: 4.75 mV
Lead Channel Sensing Intrinsic Amplitude: 4.75 mV
Lead Channel Setting Pacing Amplitude: 2 V
Lead Channel Setting Pacing Amplitude: 2 V
Lead Channel Setting Pacing Amplitude: 2.5 V
Lead Channel Setting Pacing Pulse Width: 0.4 ms
Lead Channel Setting Pacing Pulse Width: 1 ms
Lead Channel Setting Sensing Sensitivity: 0.3 mV

## 2018-01-30 DIAGNOSIS — M25511 Pain in right shoulder: Secondary | ICD-10-CM | POA: Diagnosis not present

## 2018-01-30 DIAGNOSIS — S42294D Other nondisplaced fracture of upper end of right humerus, subsequent encounter for fracture with routine healing: Secondary | ICD-10-CM | POA: Diagnosis not present

## 2018-02-03 ENCOUNTER — Encounter: Payer: Self-pay | Admitting: Family Medicine

## 2018-02-03 ENCOUNTER — Ambulatory Visit (INDEPENDENT_AMBULATORY_CARE_PROVIDER_SITE_OTHER): Payer: Medicare HMO | Admitting: Family Medicine

## 2018-02-03 VITALS — BP 118/64 | HR 80 | Temp 98.0°F | Ht 65.0 in | Wt 217.5 lb

## 2018-02-03 DIAGNOSIS — R2231 Localized swelling, mass and lump, right upper limb: Secondary | ICD-10-CM | POA: Diagnosis not present

## 2018-02-03 NOTE — Patient Instructions (Signed)
Someone should reach out to you in the next couple business days.   Feel free to let Dr. Marin Olp know, but I think waiting on the results may be prudent.   I think this is a skin cyst, which is not sinister.  Let us know if you need anything.

## 2018-02-03 NOTE — Progress Notes (Signed)
Chief Complaint  Patient presents with  . Mass    under right arm    Brenda Marsh is a 73 y.o. female here for a skin complaint.  Duration: 2 days; broke R shoulder in Oct, has not been able to lift her arm much because of it Location: R armpit Pruritic? No Painful? No Drainage? No New soaps/lotions/topicals/detergents? No Other associated symptoms: +hx of L sided breast cancer; no bruising, swelling, redness over area Therapies tried thus far: None  ROS:  Const: No fevers Skin: As noted in HPI  Past Medical History:  Diagnosis Date  . AICD (automatic cardioverter/defibrillator) present   . Allergic state 05/17/2016   Dr Ernst Bowler asthma and allergy specialist.  . Allergy   . Arthritis    left knee pain, MRI in 2008-tricompartmental  degenerate changes, mucoid degeneration of ACL, post horn meniscal tear and ant horn meniscal tear  . Asthma   . Breast cancer in female [facility]) 2000   left; S/P lumpectomy, radiation and chemotherapy  . CHF (congestive heart failure) ([place])   . Colon polyp    unclear pathology  . Hyperlipidemia   . Hypertension   . LBBB (left bundle branch block)   . Low back pain    left L3-4 injected  . Lymphedema of left arm   . Nonischemic cardiomyopathy ([place])   . Presence of permanent cardiac pacemaker   . Preventative health care 05/18/2016  . Travel sickness 05/12/2015  . Unspecified constipation 04/22/2012  . Vertigo     BP 118/64 (BP Location: Right Arm, Patient Position: Sitting, Cuff Size: Large)   Pulse 80   Temp 98 F (36.7 C) (Oral)   Ht 5\' 5"  (1.651 m)   Wt 217 lb 8 oz (98.7 kg)   SpO2 98%   BMI 36.19 kg/m  Gen: awake, alert, appearing stated age Lungs: No accessory muscle use Skin: Examined in presence of female chaperone. There is a small, superficial and elliptically shaped lump in the R axillae measuring approx 0.8 x 0.6 cm in dimension. There is a dark central area of excoriation centrally. No erythema, fluctuance, ttp,  excessive warmth Psych: Age appropriate judgment and insight  Lump in armpit, right - Plan: [place] AXILLA RIGHT  Orders as above. I think this is a skin cyst. Given her hx of breast cancer, will ck [place].   F/u prn. The patient voiced understanding and agreement to the plan.  [place], DO 02/03/18 8:43 AM

## 2018-02-03 NOTE — Progress Notes (Signed)
Pre visit review using our clinic review tool, if applicable. No additional management support is needed unless otherwise documented below in the visit note. 

## 2018-02-08 ENCOUNTER — Other Ambulatory Visit: Payer: Self-pay | Admitting: Family Medicine

## 2018-02-08 DIAGNOSIS — R2231 Localized swelling, mass and lump, right upper limb: Secondary | ICD-10-CM

## 2018-02-08 DIAGNOSIS — M25511 Pain in right shoulder: Secondary | ICD-10-CM | POA: Diagnosis not present

## 2018-02-08 DIAGNOSIS — S42294D Other nondisplaced fracture of upper end of right humerus, subsequent encounter for fracture with routine healing: Secondary | ICD-10-CM | POA: Diagnosis not present

## 2018-02-09 ENCOUNTER — Other Ambulatory Visit: Payer: Self-pay

## 2018-02-09 ENCOUNTER — Other Ambulatory Visit: Payer: Self-pay | Admitting: Family Medicine

## 2018-02-09 DIAGNOSIS — R2231 Localized swelling, mass and lump, right upper limb: Secondary | ICD-10-CM

## 2018-02-13 ENCOUNTER — Ambulatory Visit
Admission: RE | Admit: 2018-02-13 | Discharge: 2018-02-13 | Disposition: A | Discharge status: Home / Self Care | Payer: Medicare HMO | Source: Ambulatory Visit | Attending: Family Medicine | Admitting: Family Medicine

## 2018-02-13 DIAGNOSIS — S42294D Other nondisplaced fracture of upper end of right humerus, subsequent encounter for fracture with routine healing: Secondary | ICD-10-CM | POA: Diagnosis not present

## 2018-02-13 DIAGNOSIS — R928 Other abnormal and inconclusive findings on diagnostic imaging of breast: Secondary | ICD-10-CM | POA: Diagnosis not present

## 2018-02-13 DIAGNOSIS — N6001 Solitary cyst of right breast: Secondary | ICD-10-CM | POA: Diagnosis not present

## 2018-02-13 DIAGNOSIS — R2231 Localized swelling, mass and lump, right upper limb: Secondary | ICD-10-CM

## 2018-02-13 DIAGNOSIS — M25511 Pain in right shoulder: Secondary | ICD-10-CM | POA: Diagnosis not present

## 2018-02-22 DIAGNOSIS — M25511 Pain in right shoulder: Secondary | ICD-10-CM | POA: Diagnosis not present

## 2018-02-22 DIAGNOSIS — S42294D Other nondisplaced fracture of upper end of right humerus, subsequent encounter for fracture with routine healing: Secondary | ICD-10-CM | POA: Diagnosis not present

## 2018-03-01 ENCOUNTER — Ambulatory Visit (INDEPENDENT_AMBULATORY_CARE_PROVIDER_SITE_OTHER): Payer: Medicare HMO

## 2018-03-01 DIAGNOSIS — I5022 Chronic systolic (congestive) heart failure: Secondary | ICD-10-CM

## 2018-03-01 DIAGNOSIS — I428 Other cardiomyopathies: Secondary | ICD-10-CM

## 2018-03-01 LAB — CUP PACEART REMOTE DEVICE CHECK
Brady Statistic AP VP Percent: 0.04 %
Brady Statistic AP VS Percent: 0.02 %
Brady Statistic AS VP Percent: 98.65 %
Brady Statistic RA Percent Paced: 0.06 %
Brady Statistic RV Percent Paced: 76.16 %
Date Time Interrogation Session: 20200212122507
HighPow Impedance: 72 Ohm
Implantable Lead Implant Date: 20151030
Implantable Lead Implant Date: 20151030
Implantable Lead Location: 753860
Implantable Lead Model: 6935
Lead Channel Impedance Value: 1102 Ohm
Lead Channel Impedance Value: 342 Ohm
Lead Channel Impedance Value: 399 Ohm
Lead Channel Impedance Value: 456 Ohm
Lead Channel Impedance Value: 532 Ohm
Lead Channel Impedance Value: 589 Ohm
Lead Channel Impedance Value: 779 Ohm
Lead Channel Impedance Value: 893 Ohm
Lead Channel Pacing Threshold Amplitude: 0.5 V
Lead Channel Pacing Threshold Amplitude: 0.75 V
Lead Channel Pacing Threshold Pulse Width: 0.4 ms
Lead Channel Pacing Threshold Pulse Width: 0.4 ms
Lead Channel Sensing Intrinsic Amplitude: 13 mV
Lead Channel Sensing Intrinsic Amplitude: 4.25 mV
Lead Channel Sensing Intrinsic Amplitude: 4.25 mV
Lead Channel Setting Pacing Amplitude: 2 V
Lead Channel Setting Pacing Amplitude: 2.5 V
Lead Channel Setting Pacing Pulse Width: 0.4 ms
Lead Channel Setting Sensing Sensitivity: 0.3 mV
MDC IDC LEAD IMPLANT DT: 20151030
MDC IDC LEAD LOCATION: 753858
MDC IDC LEAD LOCATION: 753859
MDC IDC MSMT BATTERY REMAINING LONGEVITY: 21 mo
MDC IDC MSMT BATTERY VOLTAGE: 2.89 V
MDC IDC MSMT LEADCHNL LV IMPEDANCE VALUE: 1178 Ohm
MDC IDC MSMT LEADCHNL LV IMPEDANCE VALUE: 1235 Ohm
MDC IDC MSMT LEADCHNL LV IMPEDANCE VALUE: 703 Ohm
MDC IDC MSMT LEADCHNL LV IMPEDANCE VALUE: 817 Ohm
MDC IDC MSMT LEADCHNL LV PACING THRESHOLD AMPLITUDE: 1.625 V
MDC IDC MSMT LEADCHNL LV PACING THRESHOLD PULSEWIDTH: 1 ms
MDC IDC MSMT LEADCHNL RV IMPEDANCE VALUE: 399 Ohm
MDC IDC MSMT LEADCHNL RV SENSING INTR AMPL: 13 mV
MDC IDC PG IMPLANT DT: 20151030
MDC IDC SET LEADCHNL LV PACING PULSEWIDTH: 1 ms
MDC IDC SET LEADCHNL RV PACING AMPLITUDE: 2 V
MDC IDC STAT BRADY AS VS PERCENT: 1.29 %

## 2018-03-06 DIAGNOSIS — M25511 Pain in right shoulder: Secondary | ICD-10-CM | POA: Diagnosis not present

## 2018-03-06 DIAGNOSIS — S42294D Other nondisplaced fracture of upper end of right humerus, subsequent encounter for fracture with routine healing: Secondary | ICD-10-CM | POA: Diagnosis not present

## 2018-03-08 DIAGNOSIS — S42294D Other nondisplaced fracture of upper end of right humerus, subsequent encounter for fracture with routine healing: Secondary | ICD-10-CM | POA: Diagnosis not present

## 2018-03-08 DIAGNOSIS — M25511 Pain in right shoulder: Secondary | ICD-10-CM | POA: Diagnosis not present

## 2018-03-13 NOTE — Progress Notes (Signed)
Remote ICD transmission.   

## 2018-03-16 ENCOUNTER — Ambulatory Visit: Payer: Medicare HMO | Admitting: Internal Medicine

## 2018-03-16 ENCOUNTER — Encounter: Payer: Self-pay | Admitting: Internal Medicine

## 2018-03-16 VITALS — BP 136/72 | HR 85 | Ht 65.0 in | Wt 218.8 lb

## 2018-03-16 DIAGNOSIS — I10 Essential (primary) hypertension: Secondary | ICD-10-CM | POA: Diagnosis not present

## 2018-03-16 DIAGNOSIS — I5022 Chronic systolic (congestive) heart failure: Secondary | ICD-10-CM | POA: Diagnosis not present

## 2018-03-16 DIAGNOSIS — Z95810 Presence of automatic (implantable) cardiac defibrillator: Secondary | ICD-10-CM

## 2018-03-16 NOTE — Patient Instructions (Signed)
Medication Instructions:  Your physician recommends that you continue on your current medications as directed. Please refer to the Current Medication list given to you today.  Labwork: None ordered.  Testing/Procedures: None ordered.  Follow-Up: Your physician wants you to follow-up in: one year with Dr. Lovena Le.   You will receive a reminder letter in the mail two months in advance. If you don't receive a letter, please call our office to schedule the follow-up appointment.  Remote monitoring is used to monitor your ICD from home. This monitoring reduces the number of office visits required to check your device to one time per year. It allows [place] to keep an eye on the functioning of your device to ensure it is working properly. You are scheduled for a device check from home on 05/31/2018. You may send your transmission at any time that day. If you have a wireless device, the transmission will be sent automatically. After your physician reviews your transmission, you will receive a postcard with your next transmission date.  Any Other Special Instructions Will Be Listed Below (If Applicable).  If you need a refill on your cardiac medications before your next appointment, please call your pharmacy.

## 2018-03-16 NOTE — Progress Notes (Signed)
HPI Mrs. Correll returns today ICD followup. She is a pleasant72yo woman with a h/o chemotherapy for breast CA, who was initially discovered to have LV dysfunction by echo in 2010. She was placed on maximal medical therapy but ultimately had severe LV dysfunction and LBBB and underwent BiV ICD implant several years ago. Her EF has normalized. The patient has been stable in the interim. Her lymphedema has improved a bit.  She denies chest pain.  Allergies  Allergen Reactions  . Contrast Media [Iodinated Diagnostic Agents] Nausea And Vomiting    N&V  . Crestor [Rosuvastatin] Other (See Comments)    Myalgia, weakness  . Metrizamide Nausea And Vomiting  . Atorvastatin Other (See Comments)    Myalgia, weakness  . Lisinopril Other (See Comments)    REACTION: cough  . Naproxen Sodium Other (See Comments)    REACTION: Breathing difficulty  . Penicillins Other (See Comments)  . Shellfish Allergy Other (See Comments)    Cant breath   . Simvastatin Other (See Comments)    REACTION: muscle aches     Current Outpatient Medications  Medication Sig Dispense Refill  . albuterol (PROAIR HFA) 108 (90 Base) MCG/ACT inhaler Inhale 2 puffs into the lungs every 4 (four) hours as needed for wheezing or shortness of breath.    . bisoprolol (ZEBETA) 5 MG tablet Take 0.5 tablets (2.5 mg total) by mouth daily. 45 tablet 3  . cetirizine (ZYRTEC) 10 MG tablet Take 10 mg by mouth daily.    . Cholecalciferol (VITAMIN D) 400 UNITS capsule Take 400 Units by mouth daily.    Marland Kitchen EPINEPHrine 0.3 mg/0.3 mL IJ SOAJ injection Use as directed for severe allergic reaction 2 Device 1  . furosemide (LASIX) 20 MG tablet Take 1 tablet by mouth as needed for fluid.    Marland Kitchen meclizine (ANTIVERT) 12.5 MG tablet Take 1 tablet (12.5 mg total) by mouth 3 (three) times daily as needed for dizziness. 90 tablet 3  . Multiple Vitamin (MULTIVITAMIN) capsule Take 1 capsule by mouth at bedtime.     . ondansetron (ZOFRAN ODT) 4 MG  disintegrating tablet Take 1 tablet (4 mg total) by mouth every 8 (eight) hours as needed. 12 tablet 1  . pantoprazole (PROTONIX) 20 MG tablet Take 20 mg by mouth 2 (two) times daily.    . promethazine (PHENERGAN) 25 MG tablet Take 1 tablet (25 mg total) by mouth every 8 (eight) hours as needed for nausea or vomiting. 12 tablet 1  . spironolactone (ALDACTONE) 25 MG tablet TAKE 1/2 TABLET BY MOUTH DAILY 45 tablet 1  . vitamin C (ASCORBIC ACID) 500 MG tablet Take 500 mg by mouth daily.     Current Facility-Administered Medications  Medication Dose Route Frequency Provider Last Rate Last Dose  . 0.9 %  sodium chloride infusion  500 mL Intravenous Continuous Milus Banister, MD      . 0.9 %  sodium chloride infusion  500 mL Intravenous Continuous Milus Banister, MD         Past Medical History:  Diagnosis Date  . AICD (automatic cardioverter/defibrillator) present   . Allergic state 05/17/2016   Dr Ernst Bowler asthma and allergy specialist.  . Allergy   . Arthritis    left knee pain, MRI in 2008-tricompartmental  degenerate changes, mucoid degeneration of ACL, post horn meniscal tear and ant horn meniscal tear  . Asthma   . Breast cancer in female [facility]) 2000   left; S/P lumpectomy, radiation and chemotherapy  .  CHF (congestive heart failure) ([place])   . Colon polyp    unclear pathology  . Hyperlipidemia   . Hypertension   . LBBB (left bundle branch block)   . Low back pain    left L3-4 injected  . Lymphedema of left arm   . Nonischemic cardiomyopathy ([place])   . Presence of permanent cardiac pacemaker   . Preventative health care 05/18/2016  . Travel sickness 05/12/2015  . Unspecified constipation 04/22/2012  . Vertigo     ROS:   All systems reviewed and negative except as noted in the HPI.   Past Surgical History:  Procedure Laterality Date  . APPENDECTOMY  2009   Dr. Lucia Gaskins  . BI-VENTRICULAR IMPLANTABLE CARDIOVERTER DEFIBRILLATOR N/A 11/16/2013   Procedure: BI-VENTRICULAR  IMPLANTABLE CARDIOVERTER DEFIBRILLATOR  (CRT-D);  Surgeon: Evans Lance, MD;  Location: [facility] CATH LAB;  Service: Cardiovascular;  Laterality: N/A;  . BI-VENTRICULAR IMPLANTABLE CARDIOVERTER DEFIBRILLATOR  (CRT-D)  11/16/13   MDT CRTD implanted by Dr Lovena Le for NICM, CHF, and LBBB  . BREAST BIOPSY Left 2000  . BREAST LUMPECTOMY Left 2000  . CARDIAC CATHETERIZATION  ~ 2010   "no blockage"  . CARPAL TUNNEL RELEASE Bilateral 2007   Dr. Daylene Katayama  . COLONOSCOPY    . KNEE ARTHROSCOPY Right   . POLYPECTOMY    . TONSILLECTOMY    . TRIGGER FINGER RELEASE Bilateral 2007  . VAGINAL HYSTERECTOMY     Paritial     Family History  Problem Relation Age of Onset  . Heart attack Mother 67  . Diabetes Sister   . Diabetes Brother   . Coronary artery disease Other        Female first degree relative <60  . Hyperlipidemia Other   . Hypertension Other   . Cancer Other        prostate, 1st degree relative <50  . Colon cancer Maternal Aunt   . Allergic rhinitis Neg Hx   . Angioedema Neg Hx   . Asthma Neg Hx   . Eczema Neg Hx   . Immunodeficiency Neg Hx   . Urticaria Neg Hx   . Stomach cancer Neg Hx   . Rectal cancer Neg Hx   . Esophageal cancer Neg Hx   . Liver cancer Neg Hx   . Colon polyps Neg Hx      Social History   Socioeconomic History  . Marital status: Widowed    Spouse name: Not on file  . Number of children: 2  . Years of education: 32  . Highest education level: Not on file  Occupational History    Employer: DISABLED  Social Needs  . Financial resource strain: Not on file  . Food insecurity:    Worry: Not on file    Inability: Not on file  . Transportation needs:    Medical: Not on file    Non-medical: Not on file  Tobacco Use  . Smoking status: Former Smoker    Packs/day: 0.10    Years: 2.00    Pack years: 0.20    Types: Cigarettes    Last attempt to quit: 01/18/1970    Years since quitting: 48.1  . Smokeless tobacco: Never Used  . Tobacco comment: "smoked maybe  1 pack/month when I did smoke  Substance and Sexual Activity  . Alcohol use: No  . Drug use: No  . Sexual activity: Never    Birth control/protection: Surgical, Post-menopausal  Lifestyle  . Physical activity:    Days per week: Not  on file    Minutes per session: Not on file  . Stress: Not on file  Relationships  . Social connections:    Talks on phone: Not on file    Gets together: Not on file    Attends religious service: Not on file    Active member of club or organization: Not on file    Attends meetings of clubs or organizations: Not on file    Relationship status: Not on file  . Intimate partner violence:    Fear of current or ex partner: Not on file    Emotionally abused: Not on file    Physically abused: Not on file    Forced sexual activity: Not on file  Other Topics Concern  . Not on file  Social History Narrative   Widow - husband died in April 14, 2003   Occupation - retired from working in Radiation protection practitioner   2 daughter - on lives in [place], other in [place]   Remote history of tobacco but none in 40 years   Caffeine- coffee, 1 cup daily; 1 energy drink daily (V8)     BP 136/72   Pulse 85   Ht 5\' 5"  (1.651 m)   Wt 218 lb 12.8 oz (99.2 kg)   SpO2 99%   BMI 36.41 kg/m   Physical Exam:  Well appearing NAD HEENT: Unremarkable Neck:  No JVD, no thyromegally Lymphatics:  No adenopathy Back:  No CVA tenderness Lungs:  Clear with no wheezes HEART:  Regular rate rhythm, no murmurs, no rubs, no clicks Abd:  soft, positive bowel sounds, no organomegally, no rebound, no guarding Ext:  2 plus pulses, no edema, no cyanosis, no clubbing Skin:  No rashes no nodules Neuro:  CN II through XII intact, motor grossly intact   DEVICE  Normal device function.  See PaceArt for details.   Assess/Plan: 1. ICD - her Biv ICD is working normally. She has 18 months on her battery. 2. Chronic systolic heart failure - her symptoms are class 2. She will continue her current meds. 3.  Lymphedema - her arms look good today.   Mikle Bosworth.D.

## 2018-03-17 LAB — CUP PACEART INCLINIC DEVICE CHECK
Battery Remaining Longevity: 18 mo
Battery Voltage: 2.91 V
Brady Statistic RA Percent Paced: 0.06 %
Brady Statistic RV Percent Paced: 76.33 %
HIGH POWER IMPEDANCE MEASURED VALUE: 75 Ohm
Implantable Lead Implant Date: 20151030
Implantable Lead Implant Date: 20151030
Implantable Lead Location: 753858
Implantable Lead Location: 753859
Implantable Lead Location: 753860
Implantable Lead Model: 4598
Implantable Lead Model: 5076
Implantable Lead Model: 6935
Implantable Pulse Generator Implant Date: 20151030
Lead Channel Impedance Value: 1178 Ohm
Lead Channel Impedance Value: 1311 Ohm
Lead Channel Impedance Value: 399 Ohm
Lead Channel Impedance Value: 456 Ohm
Lead Channel Impedance Value: 494 Ohm
Lead Channel Impedance Value: 760 Ohm
Lead Channel Impedance Value: 855 Ohm
Lead Channel Impedance Value: 950 Ohm
Lead Channel Pacing Threshold Amplitude: 0.75 V
Lead Channel Pacing Threshold Pulse Width: 0.4 ms
Lead Channel Pacing Threshold Pulse Width: 0.4 ms
Lead Channel Pacing Threshold Pulse Width: 1 ms
Lead Channel Sensing Intrinsic Amplitude: 19.25 mV
Lead Channel Sensing Intrinsic Amplitude: 4.375 mV
Lead Channel Setting Pacing Amplitude: 2 V
Lead Channel Setting Pacing Amplitude: 2.5 V
Lead Channel Setting Sensing Sensitivity: 0.3 mV
MDC IDC LEAD IMPLANT DT: 20151030
MDC IDC MSMT LEADCHNL LV IMPEDANCE VALUE: 1273 Ohm
MDC IDC MSMT LEADCHNL LV IMPEDANCE VALUE: 437 Ohm
MDC IDC MSMT LEADCHNL LV IMPEDANCE VALUE: 589 Ohm
MDC IDC MSMT LEADCHNL LV IMPEDANCE VALUE: 627 Ohm
MDC IDC MSMT LEADCHNL LV IMPEDANCE VALUE: 836 Ohm
MDC IDC MSMT LEADCHNL LV PACING THRESHOLD AMPLITUDE: 1.625 V
MDC IDC MSMT LEADCHNL RA SENSING INTR AMPL: 5.25 mV
MDC IDC MSMT LEADCHNL RV PACING THRESHOLD AMPLITUDE: 0.5 V
MDC IDC MSMT LEADCHNL RV SENSING INTR AMPL: 11.375 mV
MDC IDC SESS DTM: 20200227181552
MDC IDC SET LEADCHNL LV PACING PULSEWIDTH: 1 ms
MDC IDC SET LEADCHNL RA PACING AMPLITUDE: 2 V
MDC IDC SET LEADCHNL RV PACING PULSEWIDTH: 0.4 ms
MDC IDC STAT BRADY AP VP PERCENT: 0.04 %
MDC IDC STAT BRADY AP VS PERCENT: 0.02 %
MDC IDC STAT BRADY AS VP PERCENT: 98.56 %
MDC IDC STAT BRADY AS VS PERCENT: 1.39 %

## 2018-04-10 ENCOUNTER — Other Ambulatory Visit: Payer: Self-pay | Admitting: Cardiology

## 2018-04-10 DIAGNOSIS — I5022 Chronic systolic (congestive) heart failure: Secondary | ICD-10-CM

## 2018-04-29 ENCOUNTER — Other Ambulatory Visit: Payer: Self-pay | Admitting: Cardiology

## 2018-05-19 ENCOUNTER — Other Ambulatory Visit: Payer: Self-pay

## 2018-05-19 MED ORDER — FUROSEMIDE 20 MG PO TABS: 20.0000 mg | ORAL_TABLET | ORAL | 3 refills | Status: DC | PRN

## 2018-05-24 ENCOUNTER — Other Ambulatory Visit: Payer: Self-pay | Admitting: Gastroenterology

## 2018-05-31 ENCOUNTER — Other Ambulatory Visit: Payer: Self-pay

## 2018-05-31 ENCOUNTER — Ambulatory Visit (INDEPENDENT_AMBULATORY_CARE_PROVIDER_SITE_OTHER): Payer: Medicare HMO | Admitting: *Deleted

## 2018-05-31 DIAGNOSIS — I428 Other cardiomyopathies: Secondary | ICD-10-CM

## 2018-05-31 DIAGNOSIS — I5022 Chronic systolic (congestive) heart failure: Secondary | ICD-10-CM

## 2018-05-31 LAB — CUP PACEART REMOTE DEVICE CHECK
Battery Remaining Longevity: 21 mo
Battery Voltage: 2.89 V
Brady Statistic AP VP Percent: 0.05 %
Brady Statistic AP VS Percent: 0.02 %
Brady Statistic AS VP Percent: 98.52 %
Brady Statistic AS VS Percent: 1.42 %
Brady Statistic RA Percent Paced: 0.06 %
Brady Statistic RV Percent Paced: 81.88 %
Date Time Interrogation Session: 20200513083723
HighPow Impedance: 74 Ohm
Implantable Lead Implant Date: 20151030
Implantable Lead Implant Date: 20151030
Implantable Lead Implant Date: 20151030
Implantable Lead Location: 753858
Implantable Lead Location: 753859
Implantable Lead Location: 753860
Implantable Lead Model: 4598
Implantable Lead Model: 5076
Implantable Lead Model: 6935
Implantable Pulse Generator Implant Date: 20151030
Lead Channel Impedance Value: 1140 Ohm
Lead Channel Impedance Value: 1235 Ohm
Lead Channel Impedance Value: 1292 Ohm
Lead Channel Impedance Value: 380 Ohm
Lead Channel Impedance Value: 399 Ohm
Lead Channel Impedance Value: 437 Ohm
Lead Channel Impedance Value: 456 Ohm
Lead Channel Impedance Value: 570 Ohm
Lead Channel Impedance Value: 646 Ohm
Lead Channel Impedance Value: 760 Ohm
Lead Channel Impedance Value: 817 Ohm
Lead Channel Impedance Value: 855 Ohm
Lead Channel Impedance Value: 912 Ohm
Lead Channel Pacing Threshold Amplitude: 0.5 V
Lead Channel Pacing Threshold Amplitude: 0.75 V
Lead Channel Pacing Threshold Amplitude: 2.125 V
Lead Channel Pacing Threshold Pulse Width: 0.4 ms
Lead Channel Pacing Threshold Pulse Width: 0.4 ms
Lead Channel Pacing Threshold Pulse Width: 1 ms
Lead Channel Sensing Intrinsic Amplitude: 13.5 mV
Lead Channel Sensing Intrinsic Amplitude: 13.5 mV
Lead Channel Sensing Intrinsic Amplitude: 4 mV
Lead Channel Sensing Intrinsic Amplitude: 4 mV
Lead Channel Setting Pacing Amplitude: 2 V
Lead Channel Setting Pacing Amplitude: 2 V
Lead Channel Setting Pacing Amplitude: 2.5 V
Lead Channel Setting Pacing Pulse Width: 0.4 ms
Lead Channel Setting Pacing Pulse Width: 1 ms
Lead Channel Setting Sensing Sensitivity: 0.3 mV

## 2018-06-05 NOTE — Progress Notes (Signed)
Virtual Visit via Video Note changed to phone visit at patient request due to technical difficulties.   This visit type was conducted due to national recommendations for restrictions regarding the COVID-19 Pandemic (e.g. social distancing) in an effort to limit this patient's exposure and mitigate transmission in our community.  Due to her co-morbid illnesses, this patient is at least at moderate risk for complications without adequate follow up.  This format is felt to be most appropriate for this patient at this time.  All issues noted in this document were discussed and addressed.  A limited physical exam was performed with this format.  Please refer to the patient's chart for her consent to telehealth for [facility].   Date:  06/07/2018   ID:  Brenda Marsh, DOB 09/17/1945, MRN [id number]  Patient Location: Home Provider Location: Home  PCP:  Mosie Lukes, MD  Cardiologist:  Dr Stanford Breed  Evaluation Performed:  Follow-Up Visit  Chief Complaint:  FU cardiomyopathy  History of Present Illness:    FU dilated cardiomyopathy. Patient has a history of left bundle branch block. She underwent cardiac catheter[facility] in March of 2010 for reduced LV function. Ejection fraction was 35%. The left main was normal. The LAD had a proximal 25-30% lesion. The circumflex was normal. The right coronary artery had a 15% lesion. Patient was treated medically. Echocardiogram in August 2015 showed an ejection fraction of 25-30% and restrictive filling. Had biventricular ICD October 2015.Echo 11/16 showed EF 40, grade 1 DD. Allergic to ASA and declined plavix in past. Echo 1/18 showed normal LV function.  Carotid Dopplers August 2019 showed less than 50% bilaterally.  Since she was last seen,the patient denies any dyspnea on exertion, orthopnea, PND, pedal edema, palpitations, syncope or chest pain.  The patient does not have symptoms concerning for COVID-19 infection (fever, chills,  cough, or new shortness of breath).    Past Medical History:  Diagnosis Date  . AICD (automatic cardioverter/defibrillator) present   . Allergic state 05/17/2016   Dr Ernst Bowler asthma and allergy specialist.  . Allergy   . Arthritis    left knee pain, MRI in 2008-tricompartmental  degenerate changes, mucoid degeneration of ACL, post horn meniscal tear and ant horn meniscal tear  . Asthma   . Breast cancer in female [facility]) 2000   left; S/P lumpectomy, radiation and chemotherapy  . CHF (congestive heart failure) ([place])   . Colon polyp    unclear pathology  . Hyperlipidemia   . Hypertension   . LBBB (left bundle branch block)   . Low back pain    left L3-4 injected  . Lymphedema of left arm   . Nonischemic cardiomyopathy ([place])   . Presence of permanent cardiac pacemaker   . Preventative health care 05/18/2016  . Travel sickness 05/12/2015  . Unspecified constipation 04/22/2012  . Vertigo    Past Surgical History:  Procedure Laterality Date  . APPENDECTOMY  2009   Dr. Lucia Gaskins  . BI-VENTRICULAR IMPLANTABLE CARDIOVERTER DEFIBRILLATOR N/A 11/16/2013   Procedure: BI-VENTRICULAR IMPLANTABLE CARDIOVERTER DEFIBRILLATOR  (CRT-D);  Surgeon: Evans Lance, MD;  Location: [facility] CATH LAB;  Service: Cardiovascular;  Laterality: N/A;  . BI-VENTRICULAR IMPLANTABLE CARDIOVERTER DEFIBRILLATOR  (CRT-D)  11/16/13   MDT CRTD implanted by Dr Lovena Le for NICM, CHF, and LBBB  . BREAST BIOPSY Left 2000  . BREAST LUMPECTOMY Left 2000  . CARDIAC CATHETERIZATION  ~ 2010   "no blockage"  . CARPAL TUNNEL RELEASE Bilateral 2007   Dr.  Sypher  . COLONOSCOPY    . KNEE ARTHROSCOPY Right   . POLYPECTOMY    . TONSILLECTOMY    . TRIGGER FINGER RELEASE Bilateral 2007  . VAGINAL HYSTERECTOMY     Paritial     Current Meds  Medication Sig  . albuterol (PROAIR HFA) 108 (90 Base) MCG/ACT inhaler Inhale 2 puffs into the lungs every 4 (four) hours as needed for wheezing or shortness of breath.  . B Complex Vitamins  (VITAMIN B COMPLEX PO) Take 1 tablet by mouth daily.  . bisoprolol (ZEBETA) 5 MG tablet TAKE 1/2 TABLET (2.5 MG TOTAL) BY MOUTH DAILY.  Marland Kitchen Cholecalciferol (VITAMIN D) 400 UNITS capsule Take 400 Units by mouth daily.  Marland Kitchen EPINEPHrine 0.3 mg/0.3 mL IJ SOAJ injection Use as directed for severe allergic reaction  . furosemide (LASIX) 20 MG tablet Take 1 tablet (20 mg total) by mouth as needed for fluid.  Marland Kitchen meclizine (ANTIVERT) 12.5 MG tablet Take 1 tablet (12.5 mg total) by mouth 3 (three) times daily as needed for dizziness.  . Multiple Vitamin (MULTIVITAMIN) capsule Take 1 capsule by mouth at bedtime.   . pantoprazole (PROTONIX) 20 MG tablet Take 20 mg by mouth 2 (two) times daily.  Marland Kitchen spironolactone (ALDACTONE) 25 MG tablet TAKE 1/2 TABLET BY MOUTH DAILY  . vitamin C (ASCORBIC ACID) 500 MG tablet Take 500 mg by mouth daily.  Marland Kitchen VITAMIN E PO Take by mouth as directed.   Current Facility-Administered Medications for the 06/07/18 encounter (Telemedicine) with Lelon Perla, MD  Medication  . 0.9 %  sodium chloride infusion  . 0.9 %  sodium chloride infusion     Allergies:   Contrast media [iodinated diagnostic agents]; Crestor [rosuvastatin]; Metrizamide; Atorvastatin; Lisinopril; Naproxen sodium; Penicillins; Shellfish allergy; and Simvastatin   Social History   Tobacco Use  . Smoking status: Former Smoker    Packs/day: 0.10    Years: 2.00    Pack years: 0.20    Types: Cigarettes    Last attempt to quit: 01/18/1970    Years since quitting: 48.4  . Smokeless tobacco: Never Used  . Tobacco comment: "smoked maybe 1 pack/month when I did smoke  Substance Use Topics  . Alcohol use: No  . Drug use: No     Family Hx: The patient's family history includes Cancer in an other family member; Colon cancer in her maternal aunt; Coronary artery disease in an other family member; Diabetes in her brother and sister; Heart attack (age of onset: 70) in her mother; Hyperlipidemia in an other family  member; Hypertension in an other family member. There is no history of Allergic rhinitis, Angioedema, Asthma, Eczema, Immunodeficiency, Urticaria, Stomach cancer, Rectal cancer, Esophageal cancer, Liver cancer, or Colon polyps.  ROS:   No fevers, chills or productive cough. All other systems reviewed and are negative.  Recent Labs: 10/10/2017: ALT 19; BUN 9; Creatinine 1.20; Hemoglobin 12.0; Platelet Count 227; Potassium 3.7; Sodium 139   Recent Lipid Panel Lab Results  Component Value Date/Time   CHOL 301 (H) 05/10/2016 09:01 AM   TRIG 153.0 (H) 05/10/2016 09:01 AM   HDL 51.60 05/10/2016 09:01 AM   CHOLHDL 6 05/10/2016 09:01 AM   LDLCALC 219 (H) 05/10/2016 09:01 AM    Wt Readings from Last 3 Encounters:  06/07/18 215 lb (97.5 kg)  03/16/18 218 lb 12.8 oz (99.2 kg)  02/03/18 217 lb 8 oz (98.7 kg)     Objective:    Vital Signs:  BP (!) 150/78  Pulse 71   Ht 5\' 5"  (1.651 m)   Wt 215 lb (97.5 kg)   BMI 35.78 kg/m    VITAL SIGNS:  reviewed  No acute distress Answers questions appropriately Normal affect Remainder physical examination not performed (telehealth visit; coronavirus pandemic)  ASSESSMENT & PLAN:    1. Cardiomyopathy-LV function has improved on most recent study.  Patient is hesitant to take medications.  She did not tolerate ARB or ACEI in the past. 2. Chronic diastolic congestive heart failure-continue fluid restriction and low-sodium diet.  Continue Lasix and spironolactone at present dose.  Check potassium and renal function. 3. Prior ICD-followed by Dr. Lovena Le. 4. Hypertension-patient's blood pressure is elevated.  I recommended additional medications for blood pressure control but she declined.  She would prefer to follow her blood pressure and add additional medications in the future if needed.  I recommended medications to keep systolic blood pressure less than 122 and diastolic less than 85. 5. Hyperlipidemia-intolerant to statins and not interested in  other lipid lowering agents.  Continue diet. 6. Carotid artery disease-mild on most recent Dopplers.  COVID-19 Education: The importance of social distancing was discussed today.  Time:   Today, I have spent 10 minutes with the patient with telehealth technology discussing the above problems.     Medication Adjustments/Labs and Tests Ordered: Current medicines are reviewed at length with the patient today.  Concerns regarding medicines are outlined above.   Tests Ordered: No orders of the defined types were placed in this encounter.   Medication Changes: No orders of the defined types were placed in this encounter.   Disposition:  Follow up in 6 month(s)  Signed, Kirk Ruths, MD  06/07/2018 10:17 AM    [facility]

## 2018-06-07 ENCOUNTER — Encounter: Payer: Self-pay | Admitting: Cardiology

## 2018-06-07 ENCOUNTER — Telehealth (INDEPENDENT_AMBULATORY_CARE_PROVIDER_SITE_OTHER): Payer: Medicare HMO | Admitting: Cardiology

## 2018-06-07 ENCOUNTER — Other Ambulatory Visit: Payer: Self-pay

## 2018-06-07 ENCOUNTER — Telehealth: Payer: Self-pay

## 2018-06-07 VITALS — BP 150/78 | HR 71 | Ht 65.0 in | Wt 215.0 lb

## 2018-06-07 DIAGNOSIS — E7800 Pure hypercholesterolemia, unspecified: Secondary | ICD-10-CM

## 2018-06-07 DIAGNOSIS — I10 Essential (primary) hypertension: Secondary | ICD-10-CM

## 2018-06-07 DIAGNOSIS — I5022 Chronic systolic (congestive) heart failure: Secondary | ICD-10-CM

## 2018-06-07 DIAGNOSIS — I2510 Atherosclerotic heart disease of native coronary artery without angina pectoris: Secondary | ICD-10-CM

## 2018-06-07 DIAGNOSIS — I428 Other cardiomyopathies: Secondary | ICD-10-CM

## 2018-06-07 DIAGNOSIS — I5042 Chronic combined systolic (congestive) and diastolic (congestive) heart failure: Secondary | ICD-10-CM

## 2018-06-07 MED ORDER — BISOPROLOL FUMARATE 5 MG PO TABS: ORAL_TABLET | ORAL | 3 refills | Status: DC

## 2018-06-07 MED ORDER — SPIRONOLACTONE 25 MG PO TABS: 12.5000 mg | ORAL_TABLET | Freq: Every day | ORAL | 3 refills | Status: DC

## 2018-06-07 NOTE — Telephone Encounter (Signed)

## 2018-06-07 NOTE — Patient Instructions (Signed)
Medication Instructions:  Refill sent to the pharmacy electronically.  If you need a refill on your cardiac medications before your next appointment, please call your pharmacy.   Lab work: Your physician recommends that you return for lab work in: [place] If you have labs (blood work) drawn today and your tests are completely normal, you will receive your results only by: Marland Kitchen MyChart Message (if you have MyChart) OR . A paper copy in the mail If you have any lab test that is abnormal or we need to change your treatment, we will call you to review the results.  Follow-Up: Your physician wants you to follow-up in: [place] will receive a reminder letter in the mail two months in advance. If you don't receive a letter, please call our office to schedule the follow-up appointment.

## 2018-06-18 NOTE — Progress Notes (Signed)
Remote ICD transmission.   

## 2018-08-18 ENCOUNTER — Other Ambulatory Visit: Payer: Self-pay | Admitting: Gastroenterology

## 2018-08-18 ENCOUNTER — Telehealth: Payer: Self-pay | Admitting: Gastroenterology

## 2018-08-18 MED ORDER — PANTOPRAZOLE SODIUM 20 MG PO TBEC: 20.0000 mg | DELAYED_RELEASE_TABLET | Freq: Two times a day (BID) | ORAL | 1 refills | Status: DC

## 2018-08-18 NOTE — Telephone Encounter (Signed)
Sent medication with 1refill. Patient has follow up appointment in September .Pharmacy will contact when ready.

## 2018-08-18 NOTE — Telephone Encounter (Signed)
Pantoprazole sent to pharmacy with one refill to get by until follow up appointment. Pharmacy will contact patient when ready.

## 2018-08-18 NOTE — Telephone Encounter (Signed)
Pt is requesting rf for pantoprazole sent to [facility] on [address].

## 2018-08-21 ENCOUNTER — Other Ambulatory Visit (HOSPITAL_BASED_OUTPATIENT_CLINIC_OR_DEPARTMENT_OTHER): Payer: Self-pay | Admitting: Hematology & Oncology

## 2018-08-21 ENCOUNTER — Other Ambulatory Visit (HOSPITAL_BASED_OUTPATIENT_CLINIC_OR_DEPARTMENT_OTHER): Payer: Self-pay | Admitting: Family Medicine

## 2018-08-21 DIAGNOSIS — Z1231 Encounter for screening mammogram for malignant neoplasm of breast: Secondary | ICD-10-CM

## 2018-08-21 DIAGNOSIS — I5022 Chronic systolic (congestive) heart failure: Secondary | ICD-10-CM | POA: Diagnosis not present

## 2018-08-22 LAB — BASIC METABOLIC PANEL
BUN/Creatinine Ratio: 11 — ABNORMAL LOW (ref 12–28)
BUN: 9 mg/dL (ref 8–27)
CO2: 22 mmol/L (ref 20–29)
Calcium: 10 mg/dL (ref 8.7–10.3)
Chloride: 102 mmol/L (ref 96–106)
Creatinine, Ser: 0.85 mg/dL (ref 0.57–1.00)
GFR calc Af Amer: 79 mL/min/{1.73_m2} (ref >59)
GFR calc non Af Amer: 68 mL/min/{1.73_m2} (ref >59)
Glucose: 75 mg/dL (ref 65–99)
Potassium: 4.1 mmol/L (ref 3.5–5.2)
Sodium: 140 mmol/L (ref 134–144)

## 2018-08-30 ENCOUNTER — Ambulatory Visit (INDEPENDENT_AMBULATORY_CARE_PROVIDER_SITE_OTHER): Payer: Medicare HMO | Admitting: *Deleted

## 2018-08-30 DIAGNOSIS — I428 Other cardiomyopathies: Secondary | ICD-10-CM

## 2018-08-30 LAB — CUP PACEART REMOTE DEVICE CHECK
Battery Remaining Longevity: 20 mo
Battery Voltage: 2.87 V
Brady Statistic AP VP Percent: 0.06 %
Brady Statistic AP VS Percent: 0.02 %
Brady Statistic AS VP Percent: 98.53 %
Brady Statistic AS VS Percent: 1.4 %
Brady Statistic RA Percent Paced: 0.08 %
Brady Statistic RV Percent Paced: 80.78 %
Date Time Interrogation Session: 20200812062705
HighPow Impedance: 77 Ohm
Implantable Lead Implant Date: 20151030
Implantable Lead Implant Date: 20151030
Implantable Lead Implant Date: 20151030
Implantable Lead Location: 753858
Implantable Lead Location: 753859
Implantable Lead Location: 753860
Implantable Lead Model: 4598
Implantable Lead Model: 5076
Implantable Lead Model: 6935
Implantable Pulse Generator Implant Date: 20151030
Lead Channel Impedance Value: 1045 Ohm
Lead Channel Impedance Value: 1216 Ohm
Lead Channel Impedance Value: 1349 Ohm
Lead Channel Impedance Value: 1425 Ohm
Lead Channel Impedance Value: 399 Ohm
Lead Channel Impedance Value: 437 Ohm
Lead Channel Impedance Value: 456 Ohm
Lead Channel Impedance Value: 494 Ohm
Lead Channel Impedance Value: 665 Ohm
Lead Channel Impedance Value: 703 Ohm
Lead Channel Impedance Value: 855 Ohm
Lead Channel Impedance Value: 893 Ohm
Lead Channel Impedance Value: 969 Ohm
Lead Channel Pacing Threshold Amplitude: 0.5 V
Lead Channel Pacing Threshold Amplitude: 0.875 V
Lead Channel Pacing Threshold Amplitude: 2.375 V
Lead Channel Pacing Threshold Pulse Width: 0.4 ms
Lead Channel Pacing Threshold Pulse Width: 0.4 ms
Lead Channel Pacing Threshold Pulse Width: 1 ms
Lead Channel Sensing Intrinsic Amplitude: 15.875 mV
Lead Channel Sensing Intrinsic Amplitude: 15.875 mV
Lead Channel Sensing Intrinsic Amplitude: 4 mV
Lead Channel Sensing Intrinsic Amplitude: 4 mV
Lead Channel Setting Pacing Amplitude: 2 V
Lead Channel Setting Pacing Amplitude: 2 V
Lead Channel Setting Pacing Amplitude: 2.5 V
Lead Channel Setting Pacing Pulse Width: 0.4 ms
Lead Channel Setting Pacing Pulse Width: 1 ms
Lead Channel Setting Sensing Sensitivity: 0.3 mV

## 2018-09-07 ENCOUNTER — Encounter: Payer: Self-pay | Admitting: Cardiology

## 2018-09-07 NOTE — Progress Notes (Signed)
Remote ICD transmission.   

## 2018-09-19 ENCOUNTER — Ambulatory Visit: Payer: Medicare HMO | Admitting: Gastroenterology

## 2018-09-19 ENCOUNTER — Encounter: Payer: Self-pay | Admitting: Gastroenterology

## 2018-09-19 VITALS — BP 150/72 | HR 76 | Temp 98.4°F | Ht 65.0 in | Wt 221.0 lb

## 2018-09-19 DIAGNOSIS — K219 Gastro-esophageal reflux disease without esophagitis: Secondary | ICD-10-CM | POA: Diagnosis not present

## 2018-09-19 NOTE — Progress Notes (Signed)
Review of pertinent gastrointestinal problems: 1.  Dysphasia led to upper endoscopy June 2018.  The examination was completely normal.  She was to continue her antiacid medicines. 2.  Adenomatous colon polyps.  Colonoscopy 2013 in [facility], a single subcentimeter polyp was removed with a "small focus of adenomatous change within" repeat colonoscopy Dr. Ardis Hughs December 2018 found 3 subcentimeter polyps, diverticulosis.  2 of the polyps were adenomatous.  She was recommended at that time to have repeat colonoscopy at 5-year interval.   HPI: This is a very pleasant 73 year old woman whom I last saw at the time of colonoscopy almost 2 years ago.  She is here today to discuss GERD.  She has been taking proton pump inhibitor Protonix 20 mg twice daily for years.  She ran out of her prescription a month or 2 ago and has not taken any antiacid medicine since then.  She has had no trouble with dysphagia, she had only one episode of nocturnal recumbent pyrosis in those 2 months.  She believes she has gained 10 pounds in the past 6 months.  Chief complaint is GERD  ROS: complete GI ROS as described in HPI, all other review negative.  Constitutional:  No unintentional weight loss   Past Medical History:  Diagnosis Date  . AICD (automatic cardioverter/defibrillator) present   . Allergic state 05/17/2016   Dr Ernst Bowler asthma and allergy specialist.  . Allergy   . Arthritis    left knee pain, MRI in 2008-tricompartmental  degenerate changes, mucoid degeneration of ACL, post horn meniscal tear and ant horn meniscal tear  . Asthma   . Breast cancer in female [facility]) 2000   left; S/P lumpectomy, radiation and chemotherapy  . CHF (congestive heart failure) ([place])   . Colon polyp    unclear pathology  . Hyperlipidemia   . Hypertension   . LBBB (left bundle branch block)   . Low back pain    left L3-4 injected  . Lymphedema of left arm   . Nonischemic cardiomyopathy ([place])   . Presence of permanent  cardiac pacemaker   . Preventative health care 05/18/2016  . Travel sickness 05/12/2015  . Unspecified constipation 04/22/2012  . Vertigo     Past Surgical History:  Procedure Laterality Date  . APPENDECTOMY  2009   Dr. Lucia Gaskins  . BI-VENTRICULAR IMPLANTABLE CARDIOVERTER DEFIBRILLATOR N/A 11/16/2013   Procedure: BI-VENTRICULAR IMPLANTABLE CARDIOVERTER DEFIBRILLATOR  (CRT-D);  Surgeon: Evans Lance, MD;  Location: [facility] CATH LAB;  Service: Cardiovascular;  Laterality: N/A;  . BI-VENTRICULAR IMPLANTABLE CARDIOVERTER DEFIBRILLATOR  (CRT-D)  11/16/13   MDT CRTD implanted by Dr Lovena Le for NICM, CHF, and LBBB  . BREAST BIOPSY Left 2000  . BREAST LUMPECTOMY Left 2000  . CARDIAC CATHETERIZATION  ~ 2010   "no blockage"  . CARPAL TUNNEL RELEASE Bilateral 2007   Dr. Daylene Katayama  . COLONOSCOPY    . KNEE ARTHROSCOPY Right   . POLYPECTOMY    . TONSILLECTOMY    . TRIGGER FINGER RELEASE Bilateral 2007  . VAGINAL HYSTERECTOMY     Paritial    Current Outpatient Medications  Medication Sig Dispense Refill  . B Complex Vitamins (VITAMIN B COMPLEX PO) Take 1 tablet by mouth daily.    . bisoprolol (ZEBETA) 5 MG tablet TAKE 1/2 TABLET (2.5 MG TOTAL) BY MOUTH DAILY. 45 tablet 3  . Cholecalciferol (VITAMIN D) 400 UNITS capsule Take 400 Units by mouth daily.    . meclizine (ANTIVERT) 12.5 MG tablet Take 1 tablet (12.5 mg total)  by mouth 3 (three) times daily as needed for dizziness. 90 tablet 3  . Multiple Vitamin (MULTIVITAMIN) capsule Take 1 capsule by mouth at bedtime.     Marland Kitchen spironolactone (ALDACTONE) 25 MG tablet Take 0.5 tablets (12.5 mg total) by mouth daily. 45 tablet 3  . vitamin C (ASCORBIC ACID) 500 MG tablet Take 500 mg by mouth daily.    Marland Kitchen VITAMIN E PO Take by mouth as directed.    Marland Kitchen albuterol (PROAIR HFA) 108 (90 Base) MCG/ACT inhaler Inhale 2 puffs into the lungs every 4 (four) hours as needed for wheezing or shortness of breath.    . EPINEPHrine 0.3 mg/0.3 mL IJ SOAJ injection Use as directed for  severe allergic reaction 2 Device 1  . furosemide (LASIX) 20 MG tablet Take 1 tablet (20 mg total) by mouth as needed for fluid. 90 tablet 3  . pantoprazole (PROTONIX) 20 MG tablet Take 1 tablet (20 mg total) by mouth 2 (two) times daily. (Patient not taking: Reported on 09/19/2018) 60 tablet 1   Current Facility-Administered Medications  Medication Dose Route Frequency Provider Last Rate Last Dose  . 0.9 %  sodium chloride infusion  500 mL Intravenous Continuous Milus Banister, MD      . 0.9 %  sodium chloride infusion  500 mL Intravenous Continuous Milus Banister, MD        Allergies as of 09/19/2018 - Review Complete 09/19/2018  Allergen Reaction Noted  . Contrast media [iodinated diagnostic agents] Nausea And Vomiting 05/26/2011  . Crestor [rosuvastatin] Other (See Comments) 05/12/2015  . Metrizamide Nausea And Vomiting 05/26/2011  . Atorvastatin Other (See Comments) 05/12/2015  . Lisinopril Other (See Comments) 03/19/2009  . Naproxen sodium Other (See Comments)   . Penicillins Other (See Comments)   . Shellfish allergy Other (See Comments) 10/08/2013  . Simvastatin Other (See Comments) 03/19/2009    Family History  Problem Relation Age of Onset  . Heart attack Mother 37  . Diabetes Sister   . Diabetes Brother   . Coronary artery disease Other        Female first degree relative <60  . Hyperlipidemia Other   . Hypertension Other   . Cancer Other        prostate, 1st degree relative <50  . Colon cancer Maternal Aunt   . Allergic rhinitis Neg Hx   . Angioedema Neg Hx   . Asthma Neg Hx   . Eczema Neg Hx   . Immunodeficiency Neg Hx   . Urticaria Neg Hx   . Stomach cancer Neg Hx   . Rectal cancer Neg Hx   . Esophageal cancer Neg Hx   . Liver cancer Neg Hx   . Colon polyps Neg Hx     Social History   Socioeconomic History  . Marital status: Widowed    Spouse name: Not on file  . Number of children: 2  . Years of education: 74  . Highest education level: Not on  file  Occupational History    Employer: DISABLED  Social Needs  . Financial resource strain: Not on file  . Food insecurity    Worry: Not on file    Inability: Not on file  . Transportation needs    Medical: Not on file    Non-medical: Not on file  Tobacco Use  . Smoking status: Former Smoker    Packs/day: 0.10    Years: 2.00    Pack years: 0.20    Types: Cigarettes  Quit date: 01/18/1970    Years since quitting: 48.7  . Smokeless tobacco: Never Used  . Tobacco comment: "smoked maybe 1 pack/month when I did smoke  Substance and Sexual Activity  . Alcohol use: No  . Drug use: No  . Sexual activity: Never    Birth control/protection: Surgical, Post-menopausal  Lifestyle  . Physical activity    Days per week: Not on file    Minutes per session: Not on file  . Stress: Not on file  Relationships  . Social Herbalist on phone: Not on file    Gets together: Not on file    Attends religious service: Not on file    Active member of club or organization: Not on file    Attends meetings of clubs or organizations: Not on file    Relationship status: Not on file  . Intimate partner violence    Fear of current or ex partner: Not on file    Emotionally abused: Not on file    Physically abused: Not on file    Forced sexual activity: Not on file  Other Topics Concern  . Not on file  Social History Narrative   Widow - husband died in 04-26-03   Occupation - retired from working in Radiation protection practitioner   2 daughter - on lives in [place], other in [place]   Remote history of tobacco but none in 40 years   Caffeine- coffee, 1 cup daily; 1 energy drink daily (V8)     Physical Exam: BP (!) 150/72   Pulse 76   Temp 98.4 F (36.9 C)   Ht 5\' 5"  (1.651 m)   Wt 221 lb (100.2 kg)   BMI 36.78 kg/m  Constitutional: generally well-appearing Psychiatric: alert and oriented x3 Abdomen: soft, nontender, nondistended, no obvious ascites, no peritoneal signs, normal bowel sounds No  peripheral edema noted in lower extremities  Assessment and plan: 74 y.o. female with GERD  She stopped taking proton pump inhibitor and really feels no different.  She has very mild intermittent pyrosis.  She has had no dysphasia it bothered her once in the 2 months that she has stopped taking her proton pump inhibitor and that was while laying down at night.  I recommended that she not resume her proton pump inhibitor and instead that she get herself some Pepcid, famotidine 20 mg pills and that she take these as needed for intermittent rare GERD symptoms.  She will follow-up as needed  Please see the "Patient Instructions" section for addition details about the plan.  Brenda Loffler, MD [facility] 09/19/2018, 10:31 AM

## 2018-09-19 NOTE — Patient Instructions (Signed)
Please purchase the following medications over the counter and take as directed: Famotidine 20 mg as needed  Also known as Pepcid  Call for an appointment as needed  Thank you for entrusting me with your care and choosing [facility].  Dr Ardis Hughs

## 2018-09-20 ENCOUNTER — Ambulatory Visit (HOSPITAL_BASED_OUTPATIENT_CLINIC_OR_DEPARTMENT_OTHER)
Admission: RE | Admit: 2018-09-20 | Discharge: 2018-09-20 | Disposition: A | Discharge status: Home / Self Care | Payer: Medicare HMO | Source: Ambulatory Visit | Attending: Hematology & Oncology | Admitting: Hematology & Oncology

## 2018-09-20 ENCOUNTER — Other Ambulatory Visit: Payer: Self-pay

## 2018-09-20 DIAGNOSIS — H25013 Cortical age-related cataract, bilateral: Secondary | ICD-10-CM | POA: Diagnosis not present

## 2018-09-20 DIAGNOSIS — H524 Presbyopia: Secondary | ICD-10-CM | POA: Diagnosis not present

## 2018-09-20 DIAGNOSIS — Z1231 Encounter for screening mammogram for malignant neoplasm of breast: Secondary | ICD-10-CM | POA: Diagnosis not present

## 2018-09-20 DIAGNOSIS — H5203 Hypermetropia, bilateral: Secondary | ICD-10-CM | POA: Diagnosis not present

## 2018-09-20 DIAGNOSIS — H18413 Arcus senilis, bilateral: Secondary | ICD-10-CM | POA: Diagnosis not present

## 2018-09-20 DIAGNOSIS — E119 Type 2 diabetes mellitus without complications: Secondary | ICD-10-CM | POA: Diagnosis not present

## 2018-09-20 DIAGNOSIS — H2513 Age-related nuclear cataract, bilateral: Secondary | ICD-10-CM | POA: Diagnosis not present

## 2018-09-20 DIAGNOSIS — H52202 Unspecified astigmatism, left eye: Secondary | ICD-10-CM | POA: Diagnosis not present

## 2018-09-20 DIAGNOSIS — H43813 Vitreous degeneration, bilateral: Secondary | ICD-10-CM | POA: Diagnosis not present

## 2018-09-20 DIAGNOSIS — H43393 Other vitreous opacities, bilateral: Secondary | ICD-10-CM | POA: Diagnosis not present

## 2018-09-20 LAB — HM DIABETES EYE EXAM

## 2018-09-26 ENCOUNTER — Encounter: Payer: Self-pay | Admitting: Family Medicine

## 2018-10-10 ENCOUNTER — Encounter: Payer: Self-pay | Admitting: Hematology & Oncology

## 2018-10-10 ENCOUNTER — Inpatient Hospital Stay (HOSPITAL_BASED_OUTPATIENT_CLINIC_OR_DEPARTMENT_OTHER): Payer: Medicare HMO | Admitting: Hematology & Oncology

## 2018-10-10 ENCOUNTER — Telehealth: Payer: Self-pay | Admitting: Hematology & Oncology

## 2018-10-10 ENCOUNTER — Inpatient Hospital Stay: Payer: Medicare HMO | Attending: Hematology & Oncology

## 2018-10-10 ENCOUNTER — Other Ambulatory Visit: Payer: Self-pay

## 2018-10-10 VITALS — BP 119/52 | HR 75 | Temp 97.0°F | Wt 223.4 lb

## 2018-10-10 DIAGNOSIS — Z853 Personal history of malignant neoplasm of breast: Secondary | ICD-10-CM | POA: Diagnosis not present

## 2018-10-10 DIAGNOSIS — Z950 Presence of cardiac pacemaker: Secondary | ICD-10-CM | POA: Diagnosis not present

## 2018-10-10 DIAGNOSIS — I890 Lymphedema, not elsewhere classified: Secondary | ICD-10-CM | POA: Insufficient documentation

## 2018-10-10 DIAGNOSIS — E782 Mixed hyperlipidemia: Secondary | ICD-10-CM

## 2018-10-10 DIAGNOSIS — Z170 Estrogen receptor positive status [ER+]: Secondary | ICD-10-CM | POA: Insufficient documentation

## 2018-10-10 DIAGNOSIS — M810 Age-related osteoporosis without current pathological fracture: Secondary | ICD-10-CM

## 2018-10-10 LAB — CBC WITH DIFFERENTIAL (CANCER CENTER ONLY)
Abs Immature Granulocytes: 0.02 10*3/uL (ref 0.00–0.07)
Basophils Absolute: 0.1 10*3/uL (ref 0.0–0.1)
Basophils Relative: 1 %
Eosinophils Absolute: 0.4 10*3/uL (ref 0.0–0.5)
Eosinophils Relative: 5 %
HCT: 37.2 % (ref 36.0–46.0)
Hemoglobin: 12.2 g/dL (ref 12.0–15.0)
Immature Granulocytes: 0 %
Lymphocytes Relative: 39 %
Lymphs Abs: 2.7 10*3/uL (ref 0.7–4.0)
MCH: 31 pg (ref 26.0–34.0)
MCHC: 32.8 g/dL (ref 30.0–36.0)
MCV: 94.7 fL (ref 80.0–100.0)
Monocytes Absolute: 0.6 10*3/uL (ref 0.1–1.0)
Monocytes Relative: 8 %
Neutro Abs: 3.3 10*3/uL (ref 1.7–7.7)
Neutrophils Relative %: 47 %
Platelet Count: 242 10*3/uL (ref 150–400)
RBC: 3.93 MIL/uL (ref 3.87–5.11)
RDW: 14.1 % (ref 11.5–15.5)
WBC Count: 6.9 10*3/uL (ref 4.0–10.5)
nRBC: 0 % (ref 0.0–0.2)

## 2018-10-10 LAB — CMP (CANCER CENTER ONLY)
ALT: 15 U/L (ref 0–44)
AST: 19 U/L (ref 15–41)
Albumin: 4.2 g/dL (ref 3.5–5.0)
Alkaline Phosphatase: 75 U/L (ref 38–126)
Anion gap: 8 (ref 5–15)
BUN: 11 mg/dL (ref 8–23)
CO2: 27 mmol/L (ref 22–32)
Calcium: 10.2 mg/dL (ref 8.9–10.3)
Chloride: 104 mmol/L (ref 98–111)
Creatinine: 0.91 mg/dL (ref 0.44–1.00)
GFR, Est AFR Am: >60 mL/min (ref >60)
GFR, Estimated: >60 mL/min (ref >60)
Glucose, Bld: 82 mg/dL (ref 70–99)
Potassium: 3.8 mmol/L (ref 3.5–5.1)
Sodium: 139 mmol/L (ref 135–145)
Total Bilirubin: 0.3 mg/dL (ref 0.3–1.2)
Total Protein: 8 g/dL (ref 6.5–8.1)

## 2018-10-10 NOTE — Progress Notes (Signed)
Hematology and Oncology Follow Up Visit  Brenda Marsh [id number] 1945-08-09 73 y.o. 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 She comes back for her yearly visit. She is doing quite well.  Her main problem has been the right shoulder.  She has rotator cuff issues.  She will see her orthopedist regarding this.  Her pacemaker is doing quite well.  She has not had any issues with the pacemaker.  She might need a new battery for the pacemaker however.   She has lymphedema in her left arm. She is doing okay with this. I am not sure if she wears any type of compression sleeve for her left arm.   She's had no problems with nausea or vomiting. She's had no cough. His been no shortness of breath.   She's had no change in bowel or bladder habits.   Her last mammogram was 3 weeks ago.  Everything looks fine on the mammogram.   Overall, her performance status is ECOG 0.   Medications:  Allergies as of 10/10/2018      Reactions   Contrast Media [iodinated Diagnostic Agents] Nausea And Vomiting   N&V   Crestor [rosuvastatin] Other (See Comments)   Myalgia, weakness   Metrizamide Nausea And Vomiting   Atorvastatin Other (See Comments)   Myalgia, weakness   Lisinopril Other (See Comments)   REACTION: cough   Naproxen Sodium Other (See Comments)   REACTION: Breathing difficulty   Penicillins Other (See Comments)   Shellfish Allergy Other (See Comments)   Cant breath   Simvastatin Other (See Comments)   REACTION: muscle aches      Medication List       Accurate as of October 10, 2018 11:20 AM. If you have any questions, ask your nurse or doctor.        bisoprolol 5 MG tablet Commonly known as: ZEBETA TAKE 1/2 TABLET (2.5 MG TOTAL) BY MOUTH DAILY.   EPINEPHrine 0.3 mg/0.3 mL Soaj injection Commonly known as: EPI-PEN  Use as directed for severe allergic reaction   furosemide 20 MG tablet Commonly known as: LASIX Take 1 tablet (20 mg total) by mouth as needed for fluid.   meclizine 12.5 MG tablet Commonly known as: ANTIVERT Take 1 tablet (12.5 mg total) by mouth 3 (three) times daily as needed for dizziness.   multivitamin capsule Take 1 capsule by mouth at bedtime.   ProAir HFA 108 (90 Base) MCG/ACT inhaler Generic drug: albuterol Inhale 2 puffs into the lungs every 4 (four) hours as needed for wheezing or shortness of breath.   spironolactone 25 MG tablet Commonly known as: ALDACTONE Take 0.5 tablets (12.5 mg total) by mouth daily.   VITAMIN B COMPLEX PO Take 1 tablet by mouth daily.   vitamin C 500 MG tablet Commonly known as: ASCORBIC ACID Take 500 mg by mouth daily.   Vitamin D 400 units capsule Take 400 Units by mouth daily.   VITAMIN E PO Take by mouth as directed.       Allergies:   Past Medical History, Surgical history, Social history, and Family History were reviewed and updated.  Review of Systems: Review of Systems  Constitutional: Negative.   HENT: Negative.   Eyes: Negative.   Respiratory: Negative.   Cardiovascular: Negative.   Gastrointestinal: Negative.   Genitourinary: Negative.  Musculoskeletal: Negative.   Skin: Negative.   Neurological: Negative.   Endo/Heme/Allergies: Negative.   Psychiatric/Behavioral: Negative.      Physical Exam:  weight is 223 lb 6.4 oz (101.3 kg). Her oral temperature is 97 F (36.1 C) (abnormal). Her blood pressure is 119/52 (abnormal) and her pulse is 75. Her oxygen saturation is 100%.   Wt Readings from Last 3 Encounters:  10/10/18 223 lb 6.4 oz (101.3 kg)  09/19/18 221 lb (100.2 kg)  06/07/18 215 lb (97.5 kg)    Physical Exam Constitutional:      Comments: Breast exam shows her right breast with no masses, edema or erythema.  There is no right axillary adenopathy.  Left breast is slightly contracted from surgery  and radiation.  She has a well-healed lumpectomy scar at the 2 o'clock position.  There is some firmness at the lumpectomy site.  She has no distinct mass in the left breast.  There is no left axillary adenopathy.  Musculoskeletal:     Comments: There is lymphedema in the left arm.  This is moderate.  There is no erythema or warmth.  She has good range of motion of the left arm.     Lab Results  Component Value Date   WBC 6.9 10/10/2018   HGB 12.2 10/10/2018   HCT 37.2 10/10/2018   MCV 94.7 10/10/2018   PLT 242 10/10/2018   Lab Results  Component Value Date   IRON 56 12/21/2012   TIBC 429 12/21/2012   UIBC 373 12/21/2012   IRONPCTSAT 13 (L) 12/21/2012   Lab Results  Component Value Date   RBC 3.93 10/10/2018   No results found for: KPAFRELGTCHN, LAMBDASER, KAPLAMBRATIO No results found for: IGGSERUM, IGA, IGMSERUM No results found for: Odetta Pink, SPEI   Chemistry      Component Value Date/Time   NA 139 10/10/2018 1035   NA 140 08/21/2018 1129   NA 143 09/29/2016 1500   K 3.8 10/10/2018 1035   K 3.8 09/29/2016 1500   CL 104 10/10/2018 1035   CL 105 09/29/2016 1500   CO2 27 10/10/2018 1035   CO2 28 09/29/2016 1500   BUN 11 10/10/2018 1035   BUN 9 08/21/2018 1129   BUN 12 09/29/2016 1500   CREATININE 0.91 10/10/2018 1035   CREATININE 0.9 09/29/2016 1500      Component Value Date/Time   CALCIUM 10.2 10/10/2018 1035   CALCIUM 9.7 09/29/2016 1500   ALKPHOS 75 10/10/2018 1035   ALKPHOS 81 09/29/2016 1500   AST 19 10/10/2018 1035   ALT 15 10/10/2018 1035   ALT 23 09/29/2016 1500   BILITOT 0.3 10/10/2018 1035     Impression and Plan: Brenda Marsh is a 73 year old African-American female with stage IIA ductal carcinoma the left breast. She had 1 lymph node positive. Her tumor was ER positive. She was on tamoxifen.  She is approximate 18 years out from treatment.  We will continue to follow her along yearly.   I am so glad that her heart is doing okay.  I am does have a that there was no problems with her heart since we last saw her.  She does need surgery for the right shoulder, I do not see any problems from a oncologic point of view.   Brenda Napoleon, MD 9/22/202011:20 AM

## 2018-10-10 NOTE — Telephone Encounter (Signed)
Mom to inform patient of 10/10/19 appt at 1130 am per 9/22 LOS

## 2018-10-11 ENCOUNTER — Other Ambulatory Visit: Payer: Medicare HMO

## 2018-10-11 ENCOUNTER — Ambulatory Visit: Payer: Medicare HMO | Admitting: Hematology & Oncology

## 2018-10-11 DIAGNOSIS — M25511 Pain in right shoulder: Secondary | ICD-10-CM | POA: Diagnosis not present

## 2018-10-11 LAB — VITAMIN D 25 HYDROXY (VIT D DEFICIENCY, FRACTURES): Vit D, 25-Hydroxy: 51.2 ng/mL (ref 30.0–100.0)

## 2018-10-23 DIAGNOSIS — Z0100 Encounter for examination of eyes and vision without abnormal findings: Secondary | ICD-10-CM | POA: Diagnosis not present

## 2018-11-24 ENCOUNTER — Ambulatory Visit (INDEPENDENT_AMBULATORY_CARE_PROVIDER_SITE_OTHER): Payer: Medicare HMO | Admitting: Family Medicine

## 2018-11-24 ENCOUNTER — Other Ambulatory Visit: Payer: Self-pay

## 2018-11-24 DIAGNOSIS — M722 Plantar fascial fibromatosis: Secondary | ICD-10-CM

## 2018-11-24 DIAGNOSIS — I10 Essential (primary) hypertension: Secondary | ICD-10-CM | POA: Diagnosis not present

## 2018-11-24 DIAGNOSIS — E119 Type 2 diabetes mellitus without complications: Secondary | ICD-10-CM | POA: Diagnosis not present

## 2018-11-24 DIAGNOSIS — E782 Mixed hyperlipidemia: Secondary | ICD-10-CM | POA: Diagnosis not present

## 2018-11-27 DIAGNOSIS — M722 Plantar fascial fibromatosis: Secondary | ICD-10-CM | POA: Insufficient documentation

## 2018-11-27 NOTE — Assessment & Plan Note (Signed)
Monitor BP and report any acute concerns. no changes to meds. Encouraged heart healthy diet such as the DASH diet and exercise as tolerated.

## 2018-11-27 NOTE — Progress Notes (Signed)
Virtual Visit via phone Note  I connected with [name] on 11/24/18 at  8:20 AM EST by a phone enabled telemedicine application and verified that I am speaking with the correct person using two identifiers.  Location: Patient: home Provider: home   I discussed the limitations of evaluation and management by telemedicine and the availability of in person appointments. The patient expressed understanding and agreed to proceed. Princess Eulas Post was able to get the patient setup on a phone visit after being unable to set up a video visit.    Subjective:    Patient ID: Brenda Marsh, female    DOB: 03-31-1945, 73 y.o.   MRN: [id number]  No chief complaint on file.   HPI Patient is in today for evaluation of left heel pain and follow up on chronic medical concerns including diabetes, hypertension and more. No recent febrile illness or hospitalizations. She has been having trouble with pain in her left heel for over a month. She notes her pain is worse when she has sat for a period of time and then she tries to stand on it. No fall or trauma. No redness or warmth. Denies CP/palp/SOB/HA/congestion/fevers/GI or GU c/o. Taking meds as prescribed  Past Medical History:  Diagnosis Date  . AICD (automatic cardioverter/defibrillator) present   . Allergic state 05/17/2016   Dr Ernst Bowler asthma and allergy specialist.  . Allergy   . Arthritis    left knee pain, MRI in 2008-tricompartmental  degenerate changes, mucoid degeneration of ACL, post horn meniscal tear and ant horn meniscal tear  . Asthma   . Breast cancer in female [facility]) 2000   left; S/P lumpectomy, radiation and chemotherapy  . CHF (congestive heart failure) ([place])   . Colon polyp    unclear pathology  . Hyperlipidemia   . Hypertension   . LBBB (left bundle branch block)   . Low back pain    left L3-4 injected  . Lymphedema of left arm   . Nonischemic cardiomyopathy ([place])   . Presence of permanent cardiac pacemaker   .  Preventative health care 05/18/2016  . Travel sickness 05/12/2015  . Unspecified constipation 04/22/2012  . Vertigo     Past Surgical History:  Procedure Laterality Date  . APPENDECTOMY  2009   Dr. Lucia Gaskins  . BI-VENTRICULAR IMPLANTABLE CARDIOVERTER DEFIBRILLATOR N/A 11/16/2013   Procedure: BI-VENTRICULAR IMPLANTABLE CARDIOVERTER DEFIBRILLATOR  (CRT-D);  Surgeon: Evans Lance, MD;  Location: [facility] CATH LAB;  Service: Cardiovascular;  Laterality: N/A;  . BI-VENTRICULAR IMPLANTABLE CARDIOVERTER DEFIBRILLATOR  (CRT-D)  11/16/13   MDT CRTD implanted by Dr Lovena Le for NICM, CHF, and LBBB  . BREAST BIOPSY Left 2000  . BREAST LUMPECTOMY Left 2000  . CARDIAC CATHETERIZATION  ~ 2010   "no blockage"  . CARPAL TUNNEL RELEASE Bilateral 2007   Dr. Daylene Katayama  . COLONOSCOPY    . KNEE ARTHROSCOPY Right   . POLYPECTOMY    . TONSILLECTOMY    . TRIGGER FINGER RELEASE Bilateral 2007  . VAGINAL HYSTERECTOMY     Paritial    Family History  Problem Relation Age of Onset  . Heart attack Mother 41  . Diabetes Sister   . Diabetes Brother   . Coronary artery disease Other        Female first degree relative <60  . Hyperlipidemia Other   . Hypertension Other   . Cancer Other        prostate, 1st degree relative <50  . Colon cancer Maternal Aunt   .  Allergic rhinitis Neg Hx   . Angioedema Neg Hx   . Asthma Neg Hx   . Eczema Neg Hx   . Immunodeficiency Neg Hx   . Urticaria Neg Hx   . Stomach cancer Neg Hx   . Rectal cancer Neg Hx   . Esophageal cancer Neg Hx   . Liver cancer Neg Hx   . Colon polyps Neg Hx     Social History   Socioeconomic History  . Marital status: Widowed    Spouse name: Not on file  . Number of children: 2  . Years of education: 10  . Highest education level: Not on file  Occupational History    Employer: DISABLED  Social Needs  . Financial resource strain: Not on file  . Food insecurity    Worry: Not on file    Inability: Not on file  . Transportation needs     Medical: Not on file    Non-medical: Not on file  Tobacco Use  . Smoking status: Former Smoker    Packs/day: 0.10    Years: 2.00    Pack years: 0.20    Types: Cigarettes    Quit date: 01/18/1970    Years since quitting: 48.8  . Smokeless tobacco: Never Used  . Tobacco comment: "smoked maybe 1 pack/month when I did smoke  Substance and Sexual Activity  . Alcohol use: No  . Drug use: No  . Sexual activity: Never    Birth control/protection: Surgical, Post-menopausal  Lifestyle  . Physical activity    Days per week: Not on file    Minutes per session: Not on file  . Stress: Not on file  Relationships  . Social Herbalist on phone: Not on file    Gets together: Not on file    Attends religious service: Not on file    Active member of club or organization: Not on file    Attends meetings of clubs or organizations: Not on file    Relationship status: Not on file  . Intimate partner violence    Fear of current or ex partner: Not on file    Emotionally abused: Not on file    Physically abused: Not on file    Forced sexual activity: Not on file  Other Topics Concern  . Not on file  Social History Narrative   Widow - husband died in 04/21/03   Occupation - retired from working in Radiation protection practitioner   2 daughter - on lives in [place], other in [place]   Remote history of tobacco but none in 40 years   Caffeine- coffee, 1 cup daily; 1 energy drink daily (V8)    Outpatient Medications Prior to Visit  Medication Sig Dispense Refill  . albuterol (PROAIR HFA) 108 (90 Base) MCG/ACT inhaler Inhale 2 puffs into the lungs every 4 (four) hours as needed for wheezing or shortness of breath.    . B Complex Vitamins (VITAMIN B COMPLEX PO) Take 1 tablet by mouth daily.    . bisoprolol (ZEBETA) 5 MG tablet TAKE 1/2 TABLET (2.5 MG TOTAL) BY MOUTH DAILY. 45 tablet 3  . Cholecalciferol (VITAMIN D) 400 UNITS capsule Take 400 Units by mouth daily.    Marland Kitchen EPINEPHrine 0.3 mg/0.3 mL IJ SOAJ injection  Use as directed for severe allergic reaction 2 Device 1  . furosemide (LASIX) 20 MG tablet Take 1 tablet (20 mg total) by mouth as needed for fluid. 90 tablet 3  . meclizine (ANTIVERT) 12.5  MG tablet Take 1 tablet (12.5 mg total) by mouth 3 (three) times daily as needed for dizziness. 90 tablet 3  . Multiple Vitamin (MULTIVITAMIN) capsule Take 1 capsule by mouth at bedtime.     Marland Kitchen spironolactone (ALDACTONE) 25 MG tablet Take 0.5 tablets (12.5 mg total) by mouth daily. 45 tablet 3  . vitamin C (ASCORBIC ACID) 500 MG tablet Take 500 mg by mouth daily.    Marland Kitchen VITAMIN E PO Take by mouth as directed.    Marland Kitchen 0.9 %  sodium chloride infusion     . 0.9 %  sodium chloride infusion      No facility-administered medications prior to visit.     Allergies  Allergen Reactions  . Contrast Media [Iodinated Diagnostic Agents] Nausea And Vomiting    N&V  . Crestor [Rosuvastatin] Other (See Comments)    Myalgia, weakness  . Metrizamide Nausea And Vomiting  . Atorvastatin Other (See Comments)    Myalgia, weakness  . Lisinopril Other (See Comments)    REACTION: cough  . Naproxen Sodium Other (See Comments)    REACTION: Breathing difficulty  . Penicillins Other (See Comments)  . Shellfish Allergy Other (See Comments)    Cant breath   . Simvastatin Other (See Comments)    REACTION: muscle aches    Review of Systems  Constitutional: Negative for fever and malaise/fatigue.  HENT: Negative for congestion.   Eyes: Negative for blurred vision.  Respiratory: Negative for shortness of breath.   Cardiovascular: Negative for chest pain, palpitations and leg swelling.  Gastrointestinal: Negative for abdominal pain, blood in stool and nausea.  Genitourinary: Negative for dysuria and frequency.  Musculoskeletal: Positive for joint pain. Negative for falls.  Skin: Negative for rash.  Neurological: Negative for dizziness, loss of consciousness and headaches.  Endo/Heme/Allergies: Negative for environmental  allergies.  Psychiatric/Behavioral: Negative for depression. The patient is not nervous/anxious.        Objective:    Physical Exam Unable to obtain via  BP (!) 161/70 (BP Location: Left Arm, Patient Position: Sitting, Cuff Size: Normal)   Wt 220 lb (99.8 kg)   BMI 36.61 kg/m  Wt Readings from Last 3 Encounters:  11/24/18 220 lb (99.8 kg)  10/10/18 223 lb 6.4 oz (101.3 kg)  09/19/18 221 lb (100.2 kg)    Diabetic Foot Exam - Simple   No data filed     Lab Results  Component Value Date   WBC 6.9 10/10/2018   HGB 12.2 10/10/2018   HCT 37.2 10/10/2018   PLT 242 10/10/2018   GLUCOSE 82 10/10/2018   CHOL 301 (H) 05/10/2016   TRIG 153.0 (H) 05/10/2016   HDL 51.60 05/10/2016   LDLCALC 219 (H) 05/10/2016   ALT 15 10/10/2018   AST 19 10/10/2018   NA 139 10/10/2018   K 3.8 10/10/2018   CL 104 10/10/2018   CREATININE 0.91 10/10/2018   BUN 11 10/10/2018   CO2 27 10/10/2018   TSH 0.66 05/10/2016   HGBA1C 6.1 05/10/2016   MICROALBUR <0.7 05/10/2016    Lab Results  Component Value Date   TSH 0.66 05/10/2016   Lab Results  Component Value Date   WBC 6.9 10/10/2018   HGB 12.2 10/10/2018   HCT 37.2 10/10/2018   MCV 94.7 10/10/2018   PLT 242 10/10/2018   Lab Results  Component Value Date   NA 139 10/10/2018   K 3.8 10/10/2018   CO2 27 10/10/2018   GLUCOSE 82 10/10/2018   BUN 11 10/10/2018  CREATININE 0.91 10/10/2018   BILITOT 0.3 10/10/2018   ALKPHOS 75 10/10/2018   AST 19 10/10/2018   ALT 15 10/10/2018   PROT 8.0 10/10/2018   ALBUMIN 4.2 10/10/2018   CALCIUM 10.2 10/10/2018   ANIONGAP 8 10/10/2018   GFR 82.58 05/10/2016   Lab Results  Component Value Date   CHOL 301 (H) 05/10/2016   Lab Results  Component Value Date   HDL 51.60 05/10/2016   Lab Results  Component Value Date   LDLCALC 219 (H) 05/10/2016   Lab Results  Component Value Date   TRIG 153.0 (H) 05/10/2016   Lab Results  Component Value Date   CHOLHDL 6 05/10/2016   Lab Results   Component Value Date   HGBA1C 6.1 05/10/2016       Assessment & Plan:   Problem List Items Addressed This Visit    Hyperlipidemia, mixed (Chronic)    Encouraged heart healthy diet, increase exercise, avoid trans fats, consider a krill oil cap daily. Statin intoleratnt      Essential hypertension (Chronic)    Monitor BP and report any acute concerns. no changes to meds. Encouraged heart healthy diet such as the DASH diet and exercise as tolerated.       Diabetes mellitus type 2, diet-controlled (HCC)    hgba1c acceptable, minimize simple carbs. Increase exercise as tolerated. Continue current meds      Plantar fasciitis of left foot    Encouraged stretching, ice, topical lidocaine, orthotics and if no improvement let us know so we can refer for further treatment. Tylenol bid         I am having [name] maintain her multivitamin, Vitamin D, albuterol, EPINEPHrine, vitamin C, meclizine, furosemide, VITAMIN E PO, B Complex Vitamins (VITAMIN B COMPLEX PO), bisoprolol, and spironolactone. We will stop administering sodium chloride and sodium chloride.  No orders of the defined types were placed in this encounter.    I discussed the assessment and treatment plan with the patient. The patient was provided an opportunity to ask questions and all were answered. The patient agreed with the plan and demonstrated an understanding of the instructions.   The patient was advised to call back or seek an in-person evaluation if the symptoms worsen or if the condition fails to improve as anticipated.  I provided 25  minutes of non-face-to-face time during this encounter.   [name], MD

## 2018-11-27 NOTE — Assessment & Plan Note (Signed)
hgba1c acceptable, minimize simple carbs. Increase exercise as tolerated. Continue current meds 

## 2018-11-27 NOTE — Assessment & Plan Note (Signed)
Encouraged heart healthy diet, increase exercise, avoid trans fats, consider a krill oil cap daily. Statin intoleratnt

## 2018-11-27 NOTE — Assessment & Plan Note (Signed)
Encouraged stretching, ice, topical lidocaine, orthotics and if no improvement let us know so we can refer for further treatment. Tylenol bid

## 2018-11-29 ENCOUNTER — Ambulatory Visit (INDEPENDENT_AMBULATORY_CARE_PROVIDER_SITE_OTHER): Payer: Medicare HMO | Admitting: *Deleted

## 2018-11-29 DIAGNOSIS — I428 Other cardiomyopathies: Secondary | ICD-10-CM | POA: Diagnosis not present

## 2018-11-29 DIAGNOSIS — I5042 Chronic combined systolic (congestive) and diastolic (congestive) heart failure: Secondary | ICD-10-CM

## 2018-11-29 LAB — CUP PACEART REMOTE DEVICE CHECK
Battery Remaining Longevity: 19 mo
Battery Voltage: 2.86 V
Brady Statistic AP VP Percent: 0.13 %
Brady Statistic AP VS Percent: 0.02 %
Brady Statistic AS VP Percent: 98.43 %
Brady Statistic AS VS Percent: 1.43 %
Brady Statistic RA Percent Paced: 0.14 %
Brady Statistic RV Percent Paced: 84.13 %
Date Time Interrogation Session: 20201111051248
HighPow Impedance: 77 Ohm
Implantable Lead Implant Date: 20151030
Implantable Lead Implant Date: 20151030
Implantable Lead Implant Date: 20151030
Implantable Lead Location: 753858
Implantable Lead Location: 753859
Implantable Lead Location: 753860
Implantable Lead Model: 4598
Implantable Lead Model: 5076
Implantable Lead Model: 6935
Implantable Pulse Generator Implant Date: 20151030
Lead Channel Impedance Value: 1102 Ohm
Lead Channel Impedance Value: 1178 Ohm
Lead Channel Impedance Value: 1235 Ohm
Lead Channel Impedance Value: 342 Ohm
Lead Channel Impedance Value: 399 Ohm
Lead Channel Impedance Value: 399 Ohm
Lead Channel Impedance Value: 437 Ohm
Lead Channel Impedance Value: 532 Ohm
Lead Channel Impedance Value: 589 Ohm
Lead Channel Impedance Value: 760 Ohm
Lead Channel Impedance Value: 779 Ohm
Lead Channel Impedance Value: 817 Ohm
Lead Channel Impedance Value: 893 Ohm
Lead Channel Pacing Threshold Amplitude: 0.625 V
Lead Channel Pacing Threshold Amplitude: 0.875 V
Lead Channel Pacing Threshold Amplitude: 2.5 V
Lead Channel Pacing Threshold Pulse Width: 0.4 ms
Lead Channel Pacing Threshold Pulse Width: 0.4 ms
Lead Channel Pacing Threshold Pulse Width: 1 ms
Lead Channel Sensing Intrinsic Amplitude: 11.875 mV
Lead Channel Sensing Intrinsic Amplitude: 11.875 mV
Lead Channel Sensing Intrinsic Amplitude: 4.5 mV
Lead Channel Sensing Intrinsic Amplitude: 4.5 mV
Lead Channel Setting Pacing Amplitude: 2 V
Lead Channel Setting Pacing Amplitude: 2 V
Lead Channel Setting Pacing Amplitude: 2.5 V
Lead Channel Setting Pacing Pulse Width: 0.4 ms
Lead Channel Setting Pacing Pulse Width: 1 ms
Lead Channel Setting Sensing Sensitivity: 0.3 mV

## 2018-12-09 ENCOUNTER — Telehealth: Payer: Self-pay | Admitting: *Deleted

## 2018-12-09 DIAGNOSIS — M79673 Pain in unspecified foot: Secondary | ICD-10-CM

## 2018-12-09 NOTE — Telephone Encounter (Signed)
Copied from [place] [phone]. Topic: Referral - Request for Referral >> Dec 08, 2018  4:23 PM Virl Axe D wrote: Has patient seen PCP for this complaint? Yes *If NO, is insurance requiring patient see PCP for this issue before PCP can refer them? Referral for which specialty: Orthopedics Preferred provider/office: Dr. Christena Flake Reason for referral: Pain on left heel/bone spurs

## 2018-12-10 ENCOUNTER — Other Ambulatory Visit: Payer: Self-pay | Admitting: Family Medicine

## 2018-12-10 DIAGNOSIS — M79673 Pain in unspecified foot: Secondary | ICD-10-CM

## 2018-12-12 NOTE — Telephone Encounter (Signed)
Referral placed.

## 2018-12-18 DIAGNOSIS — M79672 Pain in left foot: Secondary | ICD-10-CM | POA: Diagnosis not present

## 2018-12-18 DIAGNOSIS — M722 Plantar fascial fibromatosis: Secondary | ICD-10-CM | POA: Diagnosis not present

## 2018-12-19 DIAGNOSIS — M722 Plantar fascial fibromatosis: Secondary | ICD-10-CM | POA: Diagnosis not present

## 2018-12-21 NOTE — Progress Notes (Signed)
Remote ICD transmission.   

## 2018-12-27 DIAGNOSIS — M722 Plantar fascial fibromatosis: Secondary | ICD-10-CM | POA: Diagnosis not present

## 2019-01-10 DIAGNOSIS — M722 Plantar fascial fibromatosis: Secondary | ICD-10-CM | POA: Diagnosis not present

## 2019-01-17 DIAGNOSIS — M722 Plantar fascial fibromatosis: Secondary | ICD-10-CM | POA: Diagnosis not present

## 2019-01-24 DIAGNOSIS — M722 Plantar fascial fibromatosis: Secondary | ICD-10-CM | POA: Diagnosis not present

## 2019-01-26 NOTE — Progress Notes (Signed)
HPI: FU dilated cardiomyopathy. Patient has a history of left bundle branch block. She underwent cardiac catheterization in [facility] in March of 2010 for reduced LV function. Ejection fraction was 35%. The left main was normal. The LAD had a proximal 25-30% lesion. The circumflex was normal. The right coronary artery had a 15% lesion. Patient was treated medically. Echocardiogram in August 2015 showed an ejection fraction of 25-30% and restrictive filling. Had biventricular ICD October 2015.Echo 11/16 showed EF 40, grade 1 DD.Allergic to ASA and declined plavixin past. Echo 1/18 showed normal LV function.  Carotid Dopplers August 2019 showed less than 50% bilaterally.  Since she was last seen,patient denies dyspnea, chest pain, palpitations or syncope.  Current Outpatient Medications  Medication Sig Dispense Refill  . albuterol (PROAIR HFA) 108 (90 Base) MCG/ACT inhaler Inhale 2 puffs into the lungs every 4 (four) hours as needed for wheezing or shortness of breath.    . B Complex Vitamins (VITAMIN B COMPLEX PO) Take 1 tablet by mouth daily.    . bisoprolol (ZEBETA) 5 MG tablet TAKE 1/2 TABLET (2.5 MG TOTAL) BY MOUTH DAILY. 45 tablet 3  . Cholecalciferol (VITAMIN D) 400 UNITS capsule Take 400 Units by mouth daily.    . furosemide (LASIX) 20 MG tablet Take 1 tablet (20 mg total) by mouth as needed for fluid. 90 tablet 3  . meclizine (ANTIVERT) 12.5 MG tablet Take 1 tablet (12.5 mg total) by mouth 3 (three) times daily as needed for dizziness. 90 tablet 3  . Multiple Vitamin (MULTIVITAMIN) capsule Take 1 capsule by mouth at bedtime.     Marland Kitchen spironolactone (ALDACTONE) 25 MG tablet Take 0.5 tablets (12.5 mg total) by mouth daily. 45 tablet 3  . vitamin C (ASCORBIC ACID) 500 MG tablet Take 500 mg by mouth daily.    Marland Kitchen VITAMIN E PO Take by mouth as directed.    Marland Kitchen EPINEPHrine 0.3 mg/0.3 mL IJ SOAJ injection Use as directed for severe allergic reaction (Patient not taking: Reported on 01/31/2019) 2  Device 1   No current facility-administered medications for this visit.     Past Medical History:  Diagnosis Date  . AICD (automatic cardioverter/defibrillator) present   . Allergic state 05/17/2016   Dr Ernst Bowler asthma and allergy specialist.  . Allergy   . Arthritis    left knee pain, MRI in 2008-tricompartmental  degenerate changes, mucoid degeneration of ACL, post horn meniscal tear and ant horn meniscal tear  . Asthma   . Breast cancer in female [facility]) 2000   left; S/P lumpectomy, radiation and chemotherapy  . CHF (congestive heart failure) ([place])   . Colon polyp    unclear pathology  . Hyperlipidemia   . Hypertension   . LBBB (left bundle branch block)   . Low back pain    left L3-4 injected  . Lymphedema of left arm   . Nonischemic cardiomyopathy ([place])   . Presence of permanent cardiac pacemaker   . Preventative health care 05/18/2016  . Travel sickness 05/12/2015  . Unspecified constipation 04/22/2012  . Vertigo     Past Surgical History:  Procedure Laterality Date  . APPENDECTOMY  2009   Dr. Lucia Gaskins  . BI-VENTRICULAR IMPLANTABLE CARDIOVERTER DEFIBRILLATOR N/A 11/16/2013   Procedure: BI-VENTRICULAR IMPLANTABLE CARDIOVERTER DEFIBRILLATOR  (CRT-D);  Surgeon: Evans Lance, MD;  Location: [facility] CATH LAB;  Service: Cardiovascular;  Laterality: N/A;  . BI-VENTRICULAR IMPLANTABLE CARDIOVERTER DEFIBRILLATOR  (CRT-D)  11/16/13   MDT CRTD implanted by Dr Lovena Le  for NICM, CHF, and LBBB  . BREAST BIOPSY Left 05-May-1998  . BREAST LUMPECTOMY Left 05/05/1998  . CARDIAC CATHETERIZATION  ~ 05/04/2008   "no blockage"  . CARPAL TUNNEL RELEASE Bilateral May 04, 2005   Dr. Daylene Katayama  . COLONOSCOPY    . KNEE ARTHROSCOPY Right   . POLYPECTOMY    . TONSILLECTOMY    . TRIGGER FINGER RELEASE Bilateral 05/04/2005  . VAGINAL HYSTERECTOMY     Paritial    Social History   Socioeconomic History  . Marital status: Widowed    Spouse name: Not on file  . Number of children: 2  . Years of education: 3  . Highest education  level: Not on file  Occupational History    Employer: DISABLED  Tobacco Use  . Smoking status: Former Smoker    Packs/day: 0.10    Years: 2.00    Pack years: 0.20    Types: Cigarettes    Quit date: 01/18/1970    Years since quitting: 49.0  . Smokeless tobacco: Never Used  . Tobacco comment: "smoked maybe 1 pack/month when I did smoke  Substance and Sexual Activity  . Alcohol use: No  . Drug use: No  . Sexual activity: Never    Birth control/protection: Surgical, Post-menopausal  Other Topics Concern  . Not on file  Social History Narrative   Widow - husband died in 05-05-03   Occupation - retired from working in Radiation protection practitioner   2 daughter - on lives in [place], other in [place]   Remote history of tobacco but none in 40 years   Caffeine- coffee, 1 cup daily; 1 energy drink daily (V8)   Social Determinants of Health   Financial Resource Strain:   . Difficulty of Paying Living Expenses: Not on file  Food Insecurity:   . Worried About Charity fundraiser in the Last Year: Not on file  . Ran Out of Food in the Last Year: Not on file  Transportation Needs:   . Lack of Transportation (Medical): Not on file  . Lack of Transportation (Non-Medical): Not on file  Physical Activity:   . Days of Exercise per Week: Not on file  . Minutes of Exercise per Session: Not on file  Stress:   . Feeling of Stress : Not on file  Social Connections:   . Frequency of Communication with Friends and Family: Not on file  . Frequency of Social Gatherings with Friends and Family: Not on file  . Attends Religious Services: Not on file  . Active Member of Clubs or Organizations: Not on file  . Attends Archivist Meetings: Not on file  . Marital Status: Not on file  Intimate Partner Violence:   . Fear of Current or Ex-Partner: Not on file  . Emotionally Abused: Not on file  . Physically Abused: Not on file  . Sexually Abused: Not on file    Family History  Problem Relation Age of Onset  .  Heart attack Mother 62  . Diabetes Sister   . Diabetes Brother   . Coronary artery disease Other        Female first degree relative <60  . Hyperlipidemia Other   . Hypertension Other   . Cancer Other        prostate, 1st degree relative <50  . Colon cancer Maternal Aunt   . Allergic rhinitis Neg Hx   . Angioedema Neg Hx   . Asthma Neg Hx   . Eczema Neg Hx   .  Immunodeficiency Neg Hx   . Urticaria Neg Hx   . Stomach cancer Neg Hx   . Rectal cancer Neg Hx   . Esophageal cancer Neg Hx   . Liver cancer Neg Hx   . Colon polyps Neg Hx     ROS: no fevers or chills, productive cough, hemoptysis, dysphasia, odynophagia, melena, hematochezia, dysuria, hematuria, rash, seizure activity, orthopnea, PND, pedal edema, claudication. Remaining systems are negative.  Physical Exam: Well-developed well-nourished in no acute distress.  Skin is warm and dry.  HEENT is normal.  Neck is supple.  Chest is clear to auscultation with normal expansion.  Cardiovascular exam is regular rate and rhythm.  Abdominal exam nontender or distended. No masses palpated. Extremities show no edema. neuro grossly intact  ECG-sinus rhythm at a rate of 80, ventricular pacing.  Personally reviewed  A/P  1 cardiomyopathy-LV function improved on most recent echocardiogram.  I will repeat study.  Patient remains hesitant to take medications.  She did not tolerate either ARB or ACE inhibitor in the past.  Continue beta-blocker.  She is not interested in additional medication such as hydralazine/nitrates.  2 prior ICD-managed by electrophysiology.  3 chronic diastolic congestive heart failure-we will continue with present diuretic regimen.    4 hypertension-patient's blood pressure is borderline.  She declines other medications.  5 hyperlipidemia-patient is intolerant to statins and has declined all other lipid-lowering medications.  Continue diet.  6 carotid artery disease-mild on most recent Dopplers.  Kirk Ruths, MD

## 2019-01-31 ENCOUNTER — Ambulatory Visit (INDEPENDENT_AMBULATORY_CARE_PROVIDER_SITE_OTHER): Payer: Medicare HMO | Admitting: Cardiology

## 2019-01-31 ENCOUNTER — Encounter: Payer: Self-pay | Admitting: Cardiology

## 2019-01-31 ENCOUNTER — Other Ambulatory Visit: Payer: Self-pay

## 2019-01-31 VITALS — BP 144/68 | HR 80 | Ht 65.0 in | Wt 223.1 lb

## 2019-01-31 DIAGNOSIS — I10 Essential (primary) hypertension: Secondary | ICD-10-CM

## 2019-01-31 DIAGNOSIS — E7800 Pure hypercholesterolemia, unspecified: Secondary | ICD-10-CM | POA: Diagnosis not present

## 2019-01-31 DIAGNOSIS — I5042 Chronic combined systolic (congestive) and diastolic (congestive) heart failure: Secondary | ICD-10-CM | POA: Diagnosis not present

## 2019-01-31 DIAGNOSIS — I428 Other cardiomyopathies: Secondary | ICD-10-CM | POA: Diagnosis not present

## 2019-01-31 DIAGNOSIS — M722 Plantar fascial fibromatosis: Secondary | ICD-10-CM | POA: Diagnosis not present

## 2019-01-31 NOTE — Patient Instructions (Signed)
Medication Instructions:  NO CHANGE *If you need a refill on your cardiac medications before your next appointment, please call your pharmacy*  Lab Work: If you have labs (blood work) drawn today and your tests are completely normal, you will receive your results only by: Marland Kitchen MyChart Message (if you have MyChart) OR . A paper copy in the mail If you have any lab test that is abnormal or we need to change your treatment, we will call you to review the results.  Testing/Procedures: Your physician has requested that you have an echocardiogram. Echocardiography is a painless test that uses sound waves to create images of your heart. It provides your doctor with information about the size and shape of your heart and how well your heart's chambers and valves are working. This procedure takes approximately one hour. There are no restrictions for this procedure.[facility] [facility]    Follow-Up: At [facility], you and your health needs are our priority.  As part of our continuing mission to provide you with exceptional heart care, we have created designated Provider Care Teams.  These Care Teams include your primary Cardiologist (physician) and Advanced Practice Providers (APPs -  Physician Assistants and Nurse Practitioners) who all work together to provide you with the care you need, when you need it.  Your next appointment:   12 month(s)  The format for your next appointment:   Either In Person or Virtual  Provider:   Kirk Ruths, MD

## 2019-02-07 ENCOUNTER — Other Ambulatory Visit: Payer: Self-pay

## 2019-02-07 ENCOUNTER — Ambulatory Visit (HOSPITAL_BASED_OUTPATIENT_CLINIC_OR_DEPARTMENT_OTHER)
Admission: RE | Admit: 2019-02-07 | Discharge: 2019-02-07 | Disposition: A | Discharge status: Home / Self Care | Payer: Medicare HMO | Source: Ambulatory Visit | Attending: Cardiology | Admitting: Cardiology

## 2019-02-07 DIAGNOSIS — I5042 Chronic combined systolic (congestive) and diastolic (congestive) heart failure: Secondary | ICD-10-CM

## 2019-02-07 DIAGNOSIS — I428 Other cardiomyopathies: Secondary | ICD-10-CM | POA: Diagnosis not present

## 2019-02-07 DIAGNOSIS — M722 Plantar fascial fibromatosis: Secondary | ICD-10-CM | POA: Diagnosis not present

## 2019-02-07 NOTE — Progress Notes (Signed)
  Echocardiogram 2D Echocardiogram has been performed.  Cardell Peach 02/07/2019, 12:11 PM

## 2019-02-14 DIAGNOSIS — M722 Plantar fascial fibromatosis: Secondary | ICD-10-CM | POA: Diagnosis not present

## 2019-02-28 ENCOUNTER — Ambulatory Visit (INDEPENDENT_AMBULATORY_CARE_PROVIDER_SITE_OTHER): Payer: Medicare HMO | Admitting: *Deleted

## 2019-02-28 DIAGNOSIS — I428 Other cardiomyopathies: Secondary | ICD-10-CM

## 2019-02-28 LAB — CUP PACEART REMOTE DEVICE CHECK
Battery Remaining Longevity: 16 mo
Battery Voltage: 2.85 V
Brady Statistic AP VP Percent: 0.19 %
Brady Statistic AP VS Percent: 0.02 %
Brady Statistic AS VP Percent: 98.4 %
Brady Statistic AS VS Percent: 1.4 %
Brady Statistic RA Percent Paced: 0.21 %
Brady Statistic RV Percent Paced: 87.19 %
Date Time Interrogation Session: 20210210001803
HighPow Impedance: 73 Ohm
Implantable Lead Implant Date: 20151030
Implantable Lead Implant Date: 20151030
Implantable Lead Implant Date: 20151030
Implantable Lead Location: 753858
Implantable Lead Location: 753859
Implantable Lead Location: 753860
Implantable Lead Model: 4598
Implantable Lead Model: 5076
Implantable Lead Model: 6935
Implantable Pulse Generator Implant Date: 20151030
Lead Channel Impedance Value: 1216 Ohm
Lead Channel Impedance Value: 1349 Ohm
Lead Channel Impedance Value: 1406 Ohm
Lead Channel Impedance Value: 342 Ohm
Lead Channel Impedance Value: 437 Ohm
Lead Channel Impedance Value: 437 Ohm
Lead Channel Impedance Value: 494 Ohm
Lead Channel Impedance Value: 646 Ohm
Lead Channel Impedance Value: 665 Ohm
Lead Channel Impedance Value: 836 Ohm
Lead Channel Impedance Value: 893 Ohm
Lead Channel Impedance Value: 950 Ohm
Lead Channel Impedance Value: 988 Ohm
Lead Channel Pacing Threshold Amplitude: 0.625 V
Lead Channel Pacing Threshold Amplitude: 0.75 V
Lead Channel Pacing Threshold Amplitude: 3 V
Lead Channel Pacing Threshold Pulse Width: 0.4 ms
Lead Channel Pacing Threshold Pulse Width: 0.4 ms
Lead Channel Pacing Threshold Pulse Width: 1 ms
Lead Channel Sensing Intrinsic Amplitude: 12.625 mV
Lead Channel Sensing Intrinsic Amplitude: 12.625 mV
Lead Channel Sensing Intrinsic Amplitude: 4.5 mV
Lead Channel Sensing Intrinsic Amplitude: 4.5 mV
Lead Channel Setting Pacing Amplitude: 2 V
Lead Channel Setting Pacing Amplitude: 2 V
Lead Channel Setting Pacing Amplitude: 2.5 V
Lead Channel Setting Pacing Pulse Width: 0.4 ms
Lead Channel Setting Pacing Pulse Width: 1 ms
Lead Channel Setting Sensing Sensitivity: 0.3 mV

## 2019-02-28 NOTE — Progress Notes (Signed)
ICD Remote  

## 2019-03-09 DIAGNOSIS — Z03818 Encounter for observation for suspected exposure to other biological agents ruled out: Secondary | ICD-10-CM | POA: Diagnosis not present

## 2019-03-09 DIAGNOSIS — Z20828 Contact with and (suspected) exposure to other viral communicable diseases: Secondary | ICD-10-CM | POA: Diagnosis not present

## 2019-03-23 ENCOUNTER — Other Ambulatory Visit: Payer: Self-pay

## 2019-03-23 ENCOUNTER — Ambulatory Visit: Payer: Medicare HMO | Admitting: Internal Medicine

## 2019-03-23 ENCOUNTER — Encounter: Payer: Self-pay | Admitting: Internal Medicine

## 2019-03-23 VITALS — BP 142/70 | HR 82 | Ht 65.0 in | Wt 226.0 lb

## 2019-03-23 DIAGNOSIS — I10 Essential (primary) hypertension: Secondary | ICD-10-CM

## 2019-03-23 DIAGNOSIS — I5042 Chronic combined systolic (congestive) and diastolic (congestive) heart failure: Secondary | ICD-10-CM

## 2019-03-23 DIAGNOSIS — Z95810 Presence of automatic (implantable) cardiac defibrillator: Secondary | ICD-10-CM | POA: Diagnosis not present

## 2019-03-23 NOTE — Patient Instructions (Signed)
Medication Instructions:  Your physician recommends that you continue on your current medications as directed. Please refer to the Current Medication list given to you today.  Labwork: None ordered.  Testing/Procedures: None ordered.  Follow-Up: Your physician wants you to follow-up in: one year with Dr. Lovena Le.   You will receive a reminder letter in the mail two months in advance. If you don't receive a letter, please call our office to schedule the follow-up appointment.  Remote monitoring is used to monitor your ICD from home. This monitoring reduces the number of office visits required to check your device to one time per year. It allows [place] to keep an eye on the functioning of your device to ensure it is working properly. You are scheduled for a device check from home on 05/30/2019. You may send your transmission at any time that day. If you have a wireless device, the transmission will be sent automatically. After your physician reviews your transmission, you will receive a postcard with your next transmission date.  Any Other Special Instructions Will Be Listed Below (If Applicable).  If you need a refill on your cardiac medications before your next appointment, please call your pharmacy.

## 2019-03-23 NOTE — Progress Notes (Signed)
HPI Brenda Marsh returns today for followup. She is a pleasant 74 yo woman with breast CA,s/p chemotherapy, LV dysfunction on maximal medical therapy, s/p biv ICD insertion. She is just over a year from reaching ERI. She denies chest pain or sob. Her lymphedema is better.  Allergies  Allergen Reactions  . Contrast Media [Iodinated Diagnostic Agents] Nausea And Vomiting    N&V  . Crestor [Rosuvastatin] Other (See Comments)    Myalgia, weakness  . Metrizamide Nausea And Vomiting  . Atorvastatin Other (See Comments)    Myalgia, weakness  . Lisinopril Other (See Comments)    REACTION: cough  . Naproxen Sodium Other (See Comments)    REACTION: Breathing difficulty  . Penicillins Other (See Comments)  . Shellfish Allergy Other (See Comments)    Cant breath   . Simvastatin Other (See Comments)    REACTION: muscle aches     Current Outpatient Medications  Medication Sig Dispense Refill  . albuterol (PROAIR HFA) 108 (90 Base) MCG/ACT inhaler Inhale 2 puffs into the lungs every 4 (four) hours as needed for wheezing or shortness of breath.    . B Complex Vitamins (VITAMIN B COMPLEX PO) Take 1 tablet by mouth daily.    . bisoprolol (ZEBETA) 5 MG tablet TAKE 1/2 TABLET (2.5 MG TOTAL) BY MOUTH DAILY. 45 tablet 3  . Cholecalciferol (VITAMIN D) 400 UNITS capsule Take 400 Units by mouth daily.    Marland Kitchen EPINEPHrine 0.3 mg/0.3 mL IJ SOAJ injection Use as directed for severe allergic reaction 2 Device 1  . furosemide (LASIX) 20 MG tablet Take 1 tablet (20 mg total) by mouth as needed for fluid. 90 tablet 3  . meclizine (ANTIVERT) 12.5 MG tablet Take 1 tablet (12.5 mg total) by mouth 3 (three) times daily as needed for dizziness. 90 tablet 3  . Multiple Vitamin (MULTIVITAMIN) capsule Take 1 capsule by mouth at bedtime.     Marland Kitchen spironolactone (ALDACTONE) 25 MG tablet Take 0.5 tablets (12.5 mg total) by mouth daily. 45 tablet 3  . vitamin C (ASCORBIC ACID) 500 MG tablet Take 500 mg by mouth daily.      Marland Kitchen VITAMIN E PO Take by mouth as directed.     No current facility-administered medications for this visit.     Past Medical History:  Diagnosis Date  . AICD (automatic cardioverter/defibrillator) present   . Allergic state 05/17/2016   Dr Ernst Bowler asthma and allergy specialist.  . Allergy   . Arthritis    left knee pain, MRI in 2008-tricompartmental  degenerate changes, mucoid degeneration of ACL, post horn meniscal tear and ant horn meniscal tear  . Asthma   . Breast cancer in female [facility]) 2000   left; S/P lumpectomy, radiation and chemotherapy  . CHF (congestive heart failure) ([place])   . Colon polyp    unclear pathology  . Hyperlipidemia   . Hypertension   . LBBB (left bundle branch block)   . Low back pain    left L3-4 injected  . Lymphedema of left arm   . Nonischemic cardiomyopathy ([place])   . Presence of permanent cardiac pacemaker   . Preventative health care 05/18/2016  . Travel sickness 05/12/2015  . Unspecified constipation 04/22/2012  . Vertigo     ROS:   All systems reviewed and negative except as noted in the HPI.   Past Surgical History:  Procedure Laterality Date  . APPENDECTOMY  2009   Dr. Lucia Gaskins  . BI-VENTRICULAR IMPLANTABLE CARDIOVERTER DEFIBRILLATOR N/A 11/16/2013  Procedure: BI-VENTRICULAR IMPLANTABLE CARDIOVERTER DEFIBRILLATOR  (CRT-D);  Surgeon: Evans Lance, MD;  Location: [facility] CATH LAB;  Service: Cardiovascular;  Laterality: N/A;  . BI-VENTRICULAR IMPLANTABLE CARDIOVERTER DEFIBRILLATOR  (CRT-D)  11/16/13   MDT CRTD implanted by Dr Lovena Le for NICM, CHF, and LBBB  . BREAST BIOPSY Left 04-17-98  . BREAST LUMPECTOMY Left 1998/04/17  . CARDIAC CATHETERIZATION  ~ 04-16-2008   "no blockage"  . CARPAL TUNNEL RELEASE Bilateral 16-Apr-2005   Dr. Daylene Katayama  . COLONOSCOPY    . KNEE ARTHROSCOPY Right   . POLYPECTOMY    . TONSILLECTOMY    . TRIGGER FINGER RELEASE Bilateral Apr 16, 2005  . VAGINAL HYSTERECTOMY     Paritial     Family History  Problem Relation Age of Onset  . Heart  attack Mother 55  . Diabetes Sister   . Diabetes Brother   . Coronary artery disease Other        Female first degree relative <60  . Hyperlipidemia Other   . Hypertension Other   . Cancer Other        prostate, 1st degree relative <50  . Colon cancer Maternal Aunt   . Allergic rhinitis Neg Hx   . Angioedema Neg Hx   . Asthma Neg Hx   . Eczema Neg Hx   . Immunodeficiency Neg Hx   . Urticaria Neg Hx   . Stomach cancer Neg Hx   . Rectal cancer Neg Hx   . Esophageal cancer Neg Hx   . Liver cancer Neg Hx   . Colon polyps Neg Hx      Social History   Socioeconomic History  . Marital status: Widowed    Spouse name: Not on file  . Number of children: 2  . Years of education: 53  . Highest education level: Not on file  Occupational History    Employer: DISABLED  Tobacco Use  . Smoking status: Former Smoker    Packs/day: 0.10    Years: 2.00    Pack years: 0.20    Types: Cigarettes    Quit date: 01/18/1970    Years since quitting: 49.2  . Smokeless tobacco: Never Used  . Tobacco comment: "smoked maybe 1 pack/month when I did smoke  Substance and Sexual Activity  . Alcohol use: No  . Drug use: No  . Sexual activity: Never    Birth control/protection: Surgical, Post-menopausal  Other Topics Concern  . Not on file  Social History Narrative   Widow - husband died in 2003/04/17   Occupation - retired from working in Radiation protection practitioner   2 daughter - on lives in [place], other in [place]   Remote history of tobacco but none in 40 years   Caffeine- coffee, 1 cup daily; 1 energy drink daily (V8)   Social Determinants of Health   Financial Resource Strain:   . Difficulty of Paying Living Expenses: Not on file  Food Insecurity:   . Worried About Charity fundraiser in the Last Year: Not on file  . Ran Out of Food in the Last Year: Not on file  Transportation Needs:   . Lack of Transportation (Medical): Not on file  . Lack of Transportation (Non-Medical): Not on file  Physical  Activity:   . Days of Exercise per Week: Not on file  . Minutes of Exercise per Session: Not on file  Stress:   . Feeling of Stress : Not on file  Social Connections:   . Frequency of Communication with Friends and  Family: Not on file  . Frequency of Social Gatherings with Friends and Family: Not on file  . Attends Religious Services: Not on file  . Active Member of Clubs or Organizations: Not on file  . Attends Archivist Meetings: Not on file  . Marital Status: Not on file  Intimate Partner Violence:   . Fear of Current or Ex-Partner: Not on file  . Emotionally Abused: Not on file  . Physically Abused: Not on file  . Sexually Abused: Not on file     BP (!) 142/70   Pulse 82   Ht 5\' 5"  (1.651 m)   Wt 226 lb (102.5 kg)   SpO2 92%   BMI 37.61 kg/m   Physical Exam:  Well appearing NAD HEENT: Unremarkable Neck:  No JVD, no thyromegally Lymphatics:  No adenopathy Back:  No CVA tenderness Lungs:  Clear with no wheezes HEART:  Regular rate rhythm, no murmurs, no rubs, no clicks Abd:  soft, positive bowel sounds, no organomegally, no rebound, no guarding Ext:  2 plus pulses, no edema, no cyanosis, no clubbing Skin:  No rashes no nodules Neuro:  CN II through XII intact, motor grossly intact  DEVICE  Normal device function.  See PaceArt for details.   Assess/Plan: 1. Chronic systolic heart failure - she is class 2. She will continue her current meds.  2. Obesity - I encouraged the patient to increase her physical activity and eat less. 3. ICD - she is 13 months from ERI. Her LV lead threshold is elevated but stable.  4. HTN - her bp is elevated a bit but is better at home. She is encouraged to reduce her salt intake.  [email or web address] [email or web address].D.

## 2019-05-21 ENCOUNTER — Other Ambulatory Visit: Payer: Self-pay

## 2019-05-22 ENCOUNTER — Encounter: Payer: Self-pay | Admitting: Family Medicine

## 2019-05-22 ENCOUNTER — Ambulatory Visit (INDEPENDENT_AMBULATORY_CARE_PROVIDER_SITE_OTHER): Payer: Medicare HMO | Admitting: Family Medicine

## 2019-05-22 VITALS — BP 116/68 | HR 72 | Temp 97.4°F | Resp 12 | Ht 65.0 in | Wt 221.0 lb

## 2019-05-22 DIAGNOSIS — E782 Mixed hyperlipidemia: Secondary | ICD-10-CM

## 2019-05-22 DIAGNOSIS — Z0000 Encounter for general adult medical examination without abnormal findings: Secondary | ICD-10-CM | POA: Diagnosis not present

## 2019-05-22 DIAGNOSIS — E119 Type 2 diabetes mellitus without complications: Secondary | ICD-10-CM

## 2019-05-22 DIAGNOSIS — Z6836 Body mass index (BMI) 36.0-36.9, adult: Secondary | ICD-10-CM | POA: Diagnosis not present

## 2019-05-22 DIAGNOSIS — I10 Essential (primary) hypertension: Secondary | ICD-10-CM | POA: Diagnosis not present

## 2019-05-22 DIAGNOSIS — R739 Hyperglycemia, unspecified: Secondary | ICD-10-CM | POA: Diagnosis not present

## 2019-05-22 DIAGNOSIS — Z23 Encounter for immunization: Secondary | ICD-10-CM | POA: Diagnosis not present

## 2019-05-22 LAB — CBC
HCT: 40.7 % (ref 36.0–46.0)
Hemoglobin: 13.7 g/dL (ref 12.0–15.0)
MCHC: 33.6 g/dL (ref 30.0–36.0)
MCV: 95.2 fl (ref 78.0–100.0)
Platelets: 176 10*3/uL (ref 150.0–400.0)
RBC: 4.28 Mil/uL (ref 3.87–5.11)
RDW: 14.3 % (ref 11.5–15.5)
WBC: 8.4 10*3/uL (ref 4.0–10.5)

## 2019-05-22 LAB — COMPREHENSIVE METABOLIC PANEL WITH GFR
ALT: 15 U/L (ref 0–35)
AST: 20 U/L (ref 0–37)
Albumin: 4.4 g/dL (ref 3.5–5.2)
Alkaline Phosphatase: 65 U/L (ref 39–117)
BUN: 11 mg/dL (ref 6–23)
CO2: 24 meq/L (ref 19–32)
Calcium: 9.9 mg/dL (ref 8.4–10.5)
Chloride: 104 meq/L (ref 96–112)
Creatinine, Ser: 0.83 mg/dL (ref 0.40–1.20)
GFR: 81.34 mL/min
Glucose, Bld: 74 mg/dL (ref 70–99)
Potassium: 4 meq/L (ref 3.5–5.1)
Sodium: 138 meq/L (ref 135–145)
Total Bilirubin: 0.4 mg/dL (ref 0.2–1.2)
Total Protein: 7.6 g/dL (ref 6.0–8.3)

## 2019-05-22 LAB — LIPID PANEL
Cholesterol: 281 mg/dL — ABNORMAL HIGH (ref 0–200)
HDL: 43.4 mg/dL (ref >39.00)
LDL Cholesterol: 216 mg/dL — ABNORMAL HIGH (ref 0–99)
NonHDL: 238.02
Total CHOL/HDL Ratio: 6
Triglycerides: 109 mg/dL (ref 0.0–149.0)
VLDL: 21.8 mg/dL (ref 0.0–40.0)

## 2019-05-22 LAB — TSH: TSH: 0.7 u[IU]/mL (ref 0.35–4.50)

## 2019-05-22 LAB — VITAMIN D 25 HYDROXY (VIT D DEFICIENCY, FRACTURES): VITD: 65.17 ng/mL (ref 30.00–100.00)

## 2019-05-22 LAB — HEMOGLOBIN A1C: Hgb A1c MFr Bld: 5.8 % (ref 4.6–6.5)

## 2019-05-22 NOTE — Patient Instructions (Addendum)
Tetanus and Shingrix are more cost effective at [place] probiotic 10 strains in 1 caps daily [email or web address].[email or web address]  Benefiber daily or twice daily 60-80 ounces of fluids daily  Recommend calcium intake of 1200 to 1500 mg daily, divided into roughly 3 doses. Best source is the diet and a single dairy serving is about 500 mg, a supplement of calcium citrate once or twice daily to balance diet is fine if not getting enough in diet. Also need Vitamin D 2000 IU caps, 1 cap daily if not already taking vitamin D. Also recommend weight baring exercise on hips and upper body to keep bones strong  Our Malady by Len Childs  MIND diet  Preventive Care 65 Years and Older, Female Preventive care refers to lifestyle choices and visits with your health care provider that can promote health and wellness. This includes:  A yearly physical exam. This is also called an annual well check.  Regular dental and eye exams.  Immunizations.  Screening for certain conditions.  Healthy lifestyle choices, such as diet and exercise. What can I expect for my preventive care visit? Physical exam Your health care provider will check:  Height and weight. These may be used to calculate body mass index (BMI), which is a measurement that tells if you are at a healthy weight.  Heart rate and blood pressure.  Your skin for abnormal spots. Counseling Your health care provider may ask you questions about:  Alcohol, tobacco, and drug use.  Emotional well-being.  Home and relationship well-being.  Sexual activity.  Eating habits.  History of falls.  Memory and ability to understand (cognition).  Work and work Statistician.  Pregnancy and menstrual history. What immunizations do I need?  Influenza (flu) vaccine  This is recommended every year. Tetanus, diphtheria, and pertussis (Tdap) vaccine  You may need a Td booster every 10 years. Varicella (chickenpox) vaccine  You may need this  vaccine if you have not already been vaccinated. Zoster (shingles) vaccine  You may need this after age 34. Pneumococcal conjugate (PCV13) vaccine  One dose is recommended after age 55. Pneumococcal polysaccharide (PPSV23) vaccine  One dose is recommended after age 3. Measles, mumps, and rubella (MMR) vaccine  You may need at least one dose of MMR if you were born in 1957 or later. You may also need a second dose. Meningococcal conjugate (MenACWY) vaccine  You may need this if you have certain conditions. Hepatitis A vaccine  You may need this if you have certain conditions or if you travel or work in places where you may be exposed to hepatitis A. Hepatitis B vaccine  You may need this if you have certain conditions or if you travel or work in places where you may be exposed to hepatitis B. Haemophilus influenzae type b (Hib) vaccine  You may need this if you have certain conditions. You may receive vaccines as individual doses or as more than one vaccine together in one shot (combination vaccines). Talk with your health care provider about the risks and benefits of combination vaccines. What tests do I need? Blood tests  Lipid and cholesterol levels. These may be checked every 5 years, or more frequently depending on your overall health.  Hepatitis C test.  Hepatitis B test. Screening  Lung cancer screening. You may have this screening every year starting at age 75 if you have a 30-pack-year history of smoking and currently smoke or have quit within the past 15 years.  Colorectal cancer screening. All  adults should have this screening starting at age 76 and continuing until age 74. Your health care provider may recommend screening at age 18 if you are at increased risk. You will have tests every 1-10 years, depending on your results and the type of screening test.  Diabetes screening. This is done by checking your blood sugar (glucose) after you have not eaten for a while  (fasting). You may have this done every 1-3 years.  Mammogram. This may be done every 1-2 years. Talk with your health care provider about how often you should have regular mammograms.  BRCA-related cancer screening. This may be done if you have a family history of breast, ovarian, tubal, or peritoneal cancers. Other tests  Sexually transmitted disease (STD) testing.  Bone density scan. This is done to screen for osteoporosis. You may have this done starting at age 52. Follow these instructions at home: Eating and drinking  Eat a diet that includes fresh fruits and vegetables, whole grains, lean protein, and low-fat dairy products. Limit your intake of foods with high amounts of sugar, saturated fats, and salt.  Take vitamin and mineral supplements as recommended by your health care provider.  Do not drink alcohol if your health care provider tells you not to drink.  If you drink alcohol: ? Limit how much you have to 0-1 drink a day. ? Be aware of how much alcohol is in your drink. In the [place] one drink equals one 12 oz bottle of beer (355 mL), one 5 oz glass of wine (148 mL), or one 1 oz glass of hard liquor (44 mL). Lifestyle  Take daily care of your teeth and gums.  Stay active. Exercise for at least 30 minutes on 5 or more days each week.  Do not use any products that contain nicotine or tobacco, such as cigarettes, e-cigarettes, and chewing tobacco. If you need help quitting, ask your health care provider.  If you are sexually active, practice safe sex. Use a condom or other form of protection in order to prevent STIs (sexually transmitted infections).  Talk with your health care provider about taking a low-dose aspirin or statin. What's next?  Go to your health care provider once a year for a well check visit.  Ask your health care provider how often you should have your eyes and teeth checked.  Stay up to date on all vaccines. This information is not intended to  replace advice given to you by your health care provider. Make sure you discuss any questions you have with your health care provider. Document Revised: 12/29/2017 Document Reviewed: 12/29/2017 Elsevier Patient Education  2020 [employer].

## 2019-05-23 LAB — VARICELLA ZOSTER ANTIBODY, IGG: Varicella IgG: 2062 index

## 2019-05-23 NOTE — Assessment & Plan Note (Signed)
hgba1c acceptable, minimize simple carbs. Increase exercise as tolerated.  

## 2019-05-23 NOTE — Assessment & Plan Note (Signed)
Well controlled, no changes to meds. Encouraged heart healthy diet such as the DASH diet and exercise as tolerated.  °

## 2019-05-23 NOTE — Assessment & Plan Note (Signed)
Encouraged heart healthy diet, increase exercise, avoid trans fats, consider a krill oil cap daily 

## 2019-05-23 NOTE — Progress Notes (Signed)
Subjective:    Patient ID: Brenda Marsh, female    DOB: [birth year], 74 y.o.   MRN: [id number]  Chief Complaint  Patient presents with  . Annual Exam    HPI Patient is in today for annual preventative exam and follow up on chronic medical concerns. She feels well today. No recent febrile illness or hospitalizations. No polyuria or polydipsia. Is trying to stay busy and eat a heart healthy diet. She has had both of her Trent shots and tolerated them well. She hs been maintaining quarantine. Denies CP/palp/SOB/HA/congestion/fevers/GI or GU c/o. Taking meds as prescribed  Past Medical History:  Diagnosis Date  . AICD (automatic cardioverter/defibrillator) present   . Allergic state 05/17/2016   Dr Ernst Bowler asthma and allergy specialist.  . Allergy   . Arthritis    left knee pain, MRI in 2008-tricompartmental  degenerate changes, mucoid degeneration of ACL, post horn meniscal tear and ant horn meniscal tear  . Asthma   . Breast cancer in female [facility]) 2000   left; S/P lumpectomy, radiation and chemotherapy  . CHF (congestive heart failure) ([place])   . Colon polyp    unclear pathology  . Hyperlipidemia   . Hypertension   . LBBB (left bundle branch block)   . Low back pain    left L3-4 injected  . Lymphedema of left arm   . Nonischemic cardiomyopathy ([place])   . Presence of permanent cardiac pacemaker   . Preventative health care 05/18/2016  . Travel sickness 05/12/2015  . Unspecified constipation 04/22/2012  . Vertigo     Past Surgical History:  Procedure Laterality Date  . APPENDECTOMY  2009   Dr. Lucia Gaskins  . BI-VENTRICULAR IMPLANTABLE CARDIOVERTER DEFIBRILLATOR N/A 11/16/2013   Procedure: BI-VENTRICULAR IMPLANTABLE CARDIOVERTER DEFIBRILLATOR  (CRT-D);  Surgeon: Evans Lance, MD;  Location: [facility] CATH LAB;  Service: Cardiovascular;  Laterality: N/A;  . BI-VENTRICULAR IMPLANTABLE CARDIOVERTER DEFIBRILLATOR  (CRT-D)  11/16/13   MDT CRTD implanted by Dr Lovena Le for NICM, CHF,  and LBBB  . BREAST BIOPSY Left 2000  . BREAST LUMPECTOMY Left 2000  . CARDIAC CATHETERIZATION  ~ 2010   "no blockage"  . CARPAL TUNNEL RELEASE Bilateral 2007   Dr. Daylene Katayama  . COLONOSCOPY    . KNEE ARTHROSCOPY Right   . POLYPECTOMY    . TONSILLECTOMY    . TRIGGER FINGER RELEASE Bilateral 2007  . VAGINAL HYSTERECTOMY     Paritial    Family History  Problem Relation Age of Onset  . Heart attack Mother 71  . Diabetes Sister   . Diabetes Brother   . Coronary artery disease Other        Female first degree relative <60  . Hyperlipidemia Other   . Hypertension Other   . Cancer Other        prostate, 1st degree relative <50  . Colon cancer Maternal Aunt   . Allergic rhinitis Neg Hx   . Angioedema Neg Hx   . Asthma Neg Hx   . Eczema Neg Hx   . Immunodeficiency Neg Hx   . Urticaria Neg Hx   . Stomach cancer Neg Hx   . Rectal cancer Neg Hx   . Esophageal cancer Neg Hx   . Liver cancer Neg Hx   . Colon polyps Neg Hx     Social History   Socioeconomic History  . Marital status: Widowed    Spouse name: Not on file  . Number of children: 2  . Years of education: 42  .  Highest education level: Not on file  Occupational History    Employer: DISABLED  Tobacco Use  . Smoking status: Former Smoker    Packs/day: 0.10    Years: 2.00    Pack years: 0.20    Types: Cigarettes    Quit date: 01/18/1970    Years since quitting: 49.3  . Smokeless tobacco: Never Used  . Tobacco comment: "smoked maybe 1 pack/month when I did smoke  Substance and Sexual Activity  . Alcohol use: No  . Drug use: No  . Sexual activity: Never    Birth control/protection: Surgical, Post-menopausal  Other Topics Concern  . Not on file  Social History Narrative   Widow - husband died in 04/12/03   Occupation - retired from working in Radiation protection practitioner   2 daughter - on lives in [place], other in [place]   Remote history of tobacco but none in 40 years   Caffeine- coffee, 1 cup daily; 1 energy drink daily (V8)     Social Determinants of Health   Financial Resource Strain:   . Difficulty of Paying Living Expenses:   Food Insecurity:   . Worried About Charity fundraiser in the Last Year:   . Arboriculturist in the Last Year:   Transportation Needs:   . Film/video editor (Medical):   Marland Kitchen Lack of Transportation (Non-Medical):   Physical Activity:   . Days of Exercise per Week:   . Minutes of Exercise per Session:   Stress:   . Feeling of Stress :   Social Connections:   . Frequency of Communication with Friends and Family:   . Frequency of Social Gatherings with Friends and Family:   . Attends Religious Services:   . Active Member of Clubs or Organizations:   . Attends Archivist Meetings:   Marland Kitchen Marital Status:   Intimate Partner Violence:   . Fear of Current or Ex-Partner:   . Emotionally Abused:   Marland Kitchen Physically Abused:   . Sexually Abused:     Outpatient Medications Prior to Visit  Medication Sig Dispense Refill  . albuterol (PROAIR HFA) 108 (90 Base) MCG/ACT inhaler Inhale 2 puffs into the lungs every 4 (four) hours as needed for wheezing or shortness of breath.    . B Complex Vitamins (VITAMIN B COMPLEX PO) Take 1 tablet by mouth daily.    . bisoprolol (ZEBETA) 5 MG tablet TAKE 1/2 TABLET (2.5 MG TOTAL) BY MOUTH DAILY. 45 tablet 3  . Cholecalciferol (VITAMIN D) 400 UNITS capsule Take 400 Units by mouth daily.    . meclizine (ANTIVERT) 12.5 MG tablet Take 1 tablet (12.5 mg total) by mouth 3 (three) times daily as needed for dizziness. 90 tablet 3  . Multiple Vitamin (MULTIVITAMIN) capsule Take 1 capsule by mouth at bedtime.     Marland Kitchen spironolactone (ALDACTONE) 25 MG tablet Take 0.5 tablets (12.5 mg total) by mouth daily. 45 tablet 3  . vitamin C (ASCORBIC ACID) 500 MG tablet Take 500 mg by mouth daily.    Marland Kitchen VITAMIN E PO Take by mouth as directed.    Marland Kitchen EPINEPHrine 0.3 mg/0.3 mL IJ SOAJ injection Use as directed for severe allergic reaction 2 Device 1  . furosemide (LASIX) 20  MG tablet Take 1 tablet (20 mg total) by mouth as needed for fluid. 90 tablet 3   No facility-administered medications prior to visit.    Allergies  Allergen Reactions  . Contrast Media [Iodinated Diagnostic Agents] Nausea And Vomiting  N&V  . Crestor [Rosuvastatin] Other (See Comments)    Myalgia, weakness  . Metrizamide Nausea And Vomiting  . Atorvastatin Other (See Comments)    Myalgia, weakness  . Lisinopril Other (See Comments)    REACTION: cough  . Naproxen Sodium Other (See Comments)    REACTION: Breathing difficulty  . Penicillins Other (See Comments)  . Shellfish Allergy Other (See Comments)    Cant breath   . Simvastatin Other (See Comments)    REACTION: muscle aches    Review of Systems  Constitutional: Negative for chills, fever and malaise/fatigue.  HENT: Negative for congestion and hearing loss.   Eyes: Negative for discharge.  Respiratory: Negative for cough, sputum production and shortness of breath.   Cardiovascular: Negative for chest pain, palpitations and leg swelling.  Gastrointestinal: Negative for abdominal pain, blood in stool, constipation, diarrhea, heartburn, nausea and vomiting.  Genitourinary: Negative for dysuria, frequency, hematuria and urgency.  Musculoskeletal: Negative for back pain, falls and myalgias.  Skin: Negative for rash.  Neurological: Negative for dizziness, sensory change, loss of consciousness, weakness and headaches.  Endo/Heme/Allergies: Negative for environmental allergies. Does not bruise/bleed easily.  Psychiatric/Behavioral: Negative for depression and suicidal ideas. The patient is not nervous/anxious and does not have insomnia.        Objective:    Physical Exam Constitutional:      General: She is not in acute distress.    Appearance: She is well-developed.  HENT:     Head: Normocephalic and atraumatic.  Eyes:     Conjunctiva/sclera: Conjunctivae normal.  Neck:     Thyroid: No thyromegaly.  Cardiovascular:      Rate and Rhythm: Normal rate and regular rhythm.     Heart sounds: Normal heart sounds. No murmur.  Pulmonary:     Effort: Pulmonary effort is normal. No respiratory distress.     Breath sounds: Normal breath sounds.  Abdominal:     General: Bowel sounds are normal. There is no distension.     Palpations: Abdomen is soft. There is no mass.     Tenderness: There is no abdominal tenderness.  Musculoskeletal:     Cervical back: Neck supple.  Lymphadenopathy:     Cervical: No cervical adenopathy.  Skin:    General: Skin is warm and dry.  Neurological:     Mental Status: She is alert and oriented to person, place, and time.  Psychiatric:        Behavior: Behavior normal.     BP 116/68 (BP Location: Right Arm, Cuff Size: Large)   Pulse 72   Temp (!) 97.4 F (36.3 C) (Temporal)   Resp 12   Ht 5\' 5"  (1.651 m)   Wt 221 lb (100.2 kg)   SpO2 98%   BMI 36.78 kg/m  Wt Readings from Last 3 Encounters:  05/22/19 221 lb (100.2 kg)  03/23/19 226 lb (102.5 kg)  01/31/19 223 lb 1.9 oz (101.2 kg)    Diabetic Foot Exam - Simple   No data filed     Lab Results  Component Value Date   WBC 8.4 05/22/2019   HGB 13.7 05/22/2019   HCT 40.7 05/22/2019   PLT 176.0 Repeated and verified X2. 05/22/2019   GLUCOSE 74 05/22/2019   CHOL 281 (H) 05/22/2019   TRIG 109.0 05/22/2019   HDL 43.40 05/22/2019   LDLCALC 216 (H) 05/22/2019   ALT 15 05/22/2019   AST 20 05/22/2019   NA 138 05/22/2019   K 4.0 05/22/2019   CL 104  05/22/2019   CREATININE 0.83 05/22/2019   BUN 11 05/22/2019   CO2 24 05/22/2019   TSH 0.70 05/22/2019   HGBA1C 5.8 05/22/2019   MICROALBUR <0.7 05/10/2016    Lab Results  Component Value Date   TSH 0.70 05/22/2019   Lab Results  Component Value Date   WBC 8.4 05/22/2019   HGB 13.7 05/22/2019   HCT 40.7 05/22/2019   MCV 95.2 05/22/2019   PLT 176.0 Repeated and verified X2. 05/22/2019   Lab Results  Component Value Date   NA 138 05/22/2019   K 4.0  05/22/2019   CO2 24 05/22/2019   GLUCOSE 74 05/22/2019   BUN 11 05/22/2019   CREATININE 0.83 05/22/2019   BILITOT 0.4 05/22/2019   ALKPHOS 65 05/22/2019   AST 20 05/22/2019   ALT 15 05/22/2019   PROT 7.6 05/22/2019   ALBUMIN 4.4 05/22/2019   CALCIUM 9.9 05/22/2019   ANIONGAP 8 10/10/2018   GFR 81.34 05/22/2019   Lab Results  Component Value Date   CHOL 281 (H) 05/22/2019   Lab Results  Component Value Date   HDL 43.40 05/22/2019   Lab Results  Component Value Date   LDLCALC 216 (H) 05/22/2019   Lab Results  Component Value Date   TRIG 109.0 05/22/2019   Lab Results  Component Value Date   CHOLHDL 6 05/22/2019   Lab Results  Component Value Date   HGBA1C 5.8 05/22/2019       Assessment & Plan:   Problem List Items Addressed This Visit    Hyperlipidemia, mixed (Chronic)    Encouraged heart healthy diet, increase exercise, avoid trans fats, consider a krill oil cap daily      Relevant Orders   Lipid panel (Completed)   Essential hypertension (Chronic)    Well controlled, no changes to meds. Encouraged heart healthy diet such as the DASH diet and exercise as tolerated.       Relevant Orders   CBC (Completed)   Comprehensive metabolic panel (Completed)   TSH (Completed)   Diabetes mellitus type 2, diet-controlled (HCC)    hgba1c acceptable, minimize simple carbs. Increase exercise as tolerated.       Relevant Orders   Hemoglobin A1c (Completed)   Morbid obesity ([place])   Preventative health care    Patient encouraged to maintain heart healthy diet, regular exercise, adequate sleep. Consider daily probiotics. Take medications as prescribed. Labs ordered and reviewed.        Other Visit Diagnoses    Hyperglycemia    -  Primary   Hypercalcemia       Relevant Orders   VITAMIN D 25 Hydroxy (Vit-D Deficiency, Fractures) (Completed)   Need for viral immunization       Relevant Orders   Varicella zoster antibody, IgG (Completed)      I have  discontinued Ladell Pier. Rabinovich's EPINEPHrine and furosemide. I am also having her maintain her multivitamin, Vitamin D, albuterol, vitamin C, meclizine, VITAMIN E PO, B Complex Vitamins (VITAMIN B COMPLEX PO), bisoprolol, and spironolactone.  No orders of the defined types were placed in this encounter.    [name], MD

## 2019-05-23 NOTE — Assessment & Plan Note (Signed)
Patient encouraged to maintain heart healthy diet, regular exercise, adequate sleep. Consider daily probiotics. Take medications as prescribed. Labs ordered and reviewed 

## 2019-05-30 ENCOUNTER — Ambulatory Visit (INDEPENDENT_AMBULATORY_CARE_PROVIDER_SITE_OTHER): Payer: Medicare HMO | Admitting: *Deleted

## 2019-05-30 DIAGNOSIS — I428 Other cardiomyopathies: Secondary | ICD-10-CM | POA: Diagnosis not present

## 2019-05-30 DIAGNOSIS — I5042 Chronic combined systolic (congestive) and diastolic (congestive) heart failure: Secondary | ICD-10-CM

## 2019-05-30 LAB — CUP PACEART REMOTE DEVICE CHECK
Battery Remaining Longevity: 13 mo
Battery Voltage: 2.83 V
Brady Statistic AP VP Percent: 0.04 %
Brady Statistic AP VS Percent: 0.02 %
Brady Statistic AS VP Percent: 98.6 %
Brady Statistic AS VS Percent: 1.34 %
Brady Statistic RA Percent Paced: 0.05 %
Brady Statistic RV Percent Paced: 80.28 %
Date Time Interrogation Session: 20210512033524
HighPow Impedance: 81 Ohm
Implantable Lead Implant Date: 20151030
Implantable Lead Implant Date: 20151030
Implantable Lead Implant Date: 20151030
Implantable Lead Location: 753858
Implantable Lead Location: 753859
Implantable Lead Location: 753860
Implantable Lead Model: 4598
Implantable Lead Model: 5076
Implantable Lead Model: 6935
Implantable Pulse Generator Implant Date: 20151030
Lead Channel Impedance Value: 1026 Ohm
Lead Channel Impedance Value: 1216 Ohm
Lead Channel Impedance Value: 1273 Ohm
Lead Channel Impedance Value: 1368 Ohm
Lead Channel Impedance Value: 323 Ohm
Lead Channel Impedance Value: 399 Ohm
Lead Channel Impedance Value: 456 Ohm
Lead Channel Impedance Value: 456 Ohm
Lead Channel Impedance Value: 570 Ohm
Lead Channel Impedance Value: 665 Ohm
Lead Channel Impedance Value: 817 Ohm
Lead Channel Impedance Value: 836 Ohm
Lead Channel Impedance Value: 855 Ohm
Lead Channel Pacing Threshold Amplitude: 0.625 V
Lead Channel Pacing Threshold Amplitude: 0.75 V
Lead Channel Pacing Threshold Amplitude: 2.375 V
Lead Channel Pacing Threshold Pulse Width: 0.4 ms
Lead Channel Pacing Threshold Pulse Width: 0.4 ms
Lead Channel Pacing Threshold Pulse Width: 1 ms
Lead Channel Sensing Intrinsic Amplitude: 14.75 mV
Lead Channel Sensing Intrinsic Amplitude: 14.75 mV
Lead Channel Sensing Intrinsic Amplitude: 3.875 mV
Lead Channel Sensing Intrinsic Amplitude: 3.875 mV
Lead Channel Setting Pacing Amplitude: 2 V
Lead Channel Setting Pacing Amplitude: 2 V
Lead Channel Setting Pacing Amplitude: 2.5 V
Lead Channel Setting Pacing Pulse Width: 0.4 ms
Lead Channel Setting Pacing Pulse Width: 1 ms
Lead Channel Setting Sensing Sensitivity: 0.3 mV

## 2019-06-01 NOTE — Progress Notes (Signed)
Remote ICD transmission.   

## 2019-07-06 ENCOUNTER — Other Ambulatory Visit: Payer: Self-pay | Admitting: Cardiology

## 2019-07-06 DIAGNOSIS — I5022 Chronic systolic (congestive) heart failure: Secondary | ICD-10-CM

## 2019-07-10 ENCOUNTER — Other Ambulatory Visit: Payer: Self-pay | Admitting: Cardiology

## 2019-07-10 DIAGNOSIS — I5022 Chronic systolic (congestive) heart failure: Secondary | ICD-10-CM

## 2019-07-10 MED ORDER — SPIRONOLACTONE 25 MG PO TABS: 12.5000 mg | ORAL_TABLET | Freq: Every day | ORAL | 3 refills | Status: DC

## 2019-07-10 NOTE — Telephone Encounter (Signed)
*  STAT* If patient is at the pharmacy, call can be transferred to refill team.   1. Which medications need to be refilled? (please list name of each medication and dose if known)Bisoprolol and Spironolactone- please call today- out of Spironolactone  2. Which pharmacy/location (including street and city if local pharmacy) is medication to be sent [facility] [address] [place]  3. Do they need a 30 day or 90 day supply? 90 and refills

## 2019-08-08 ENCOUNTER — Other Ambulatory Visit (HOSPITAL_BASED_OUTPATIENT_CLINIC_OR_DEPARTMENT_OTHER): Payer: Self-pay | Admitting: Family Medicine

## 2019-08-08 DIAGNOSIS — Z1231 Encounter for screening mammogram for malignant neoplasm of breast: Secondary | ICD-10-CM

## 2019-08-10 NOTE — Progress Notes (Signed)
I connected with Mahsa today by telephone and verified that I am speaking with the correct person using two identifiers. Location patient: home Location provider: work Persons participating in the virtual visit: patient, Marine scientist.    I discussed the limitations, risks, security and privacy concerns of performing an evaluation and management service by telephone and the availability of in person appointments. I also discussed with the patient that there may be a patient responsible charge related to this service. The patient expressed understanding and verbally consented to this telephonic visit.    Interactive audio and video telecommunications were attempted between this provider and patient, however failed, due to patient having technical difficulties OR patient did not have access to video capability.  We continued and completed visit with audio only.  Some vital signs may be absent or patient reported.     Subjective:   Brenda Marsh is a 74 y.o. female who presents for Medicare Annual (Subsequent) preventive examination.  Review of Systems    Cardiac Risk Factors include: advanced age (>39men, >36 women);diabetes mellitus;sedentary lifestyle;obesity (BMI >30kg/m2)     Objective:     Advanced Directives 08/13/2019 10/10/2018 10/29/2017 10/10/2017 09/29/2016 06/11/2016 05/17/2016  Does Patient Have a Medical Advance Directive? Yes Yes Yes Yes Yes Yes Yes  Type of Paramedic of [place];Living will [place];Living will - - [place];Living will [place];Living will Living will;Healthcare Power of Attorney  Does patient want to make changes to medical advance directive? No - Patient declined No - Patient declined - No - Patient declined - - -  Copy of [place] in Chart? No - copy requested No - copy requested - - No - copy requested - No - copy requested  Would patient like  information on creating a medical advance directive? - - - - - - -  Pre-existing out of facility DNR order (yellow form or pink MOST form) - - - - - - -    Current Medications (verified) Outpatient Encounter Medications as of 08/13/2019  Medication Sig  . bisoprolol (ZEBETA) 5 MG tablet TAKE 1/2 TABLET (2.5 MG TOTAL) BY MOUTH DAILY.  Marland Kitchen Cholecalciferol (VITAMIN D) 400 UNITS capsule Take 400 Units by mouth daily.  . Multiple Vitamin (MULTIVITAMIN) capsule Take 1 capsule by mouth at bedtime.   Marland Kitchen spironolactone (ALDACTONE) 25 MG tablet Take 0.5 tablets (12.5 mg total) by mouth daily.  . vitamin C (ASCORBIC ACID) 500 MG tablet Take 500 mg by mouth daily.  Marland Kitchen VITAMIN E PO Take by mouth as directed.  Marland Kitchen albuterol (PROAIR HFA) 108 (90 Base) MCG/ACT inhaler Inhale 2 puffs into the lungs every 4 (four) hours as needed for wheezing or shortness of breath. (Patient not taking: Reported on 08/13/2019)  . B Complex Vitamins (VITAMIN B COMPLEX PO) Take 1 tablet by mouth daily. (Patient not taking: Reported on 08/13/2019)  . meclizine (ANTIVERT) 12.5 MG tablet Take 1 tablet (12.5 mg total) by mouth 3 (three) times daily as needed for dizziness. (Patient not taking: Reported on 08/13/2019)   No facility-administered encounter medications on file as of 08/13/2019.    Allergies (verified) Contrast media [iodinated diagnostic agents], Crestor [rosuvastatin], Metrizamide, Atorvastatin, Lisinopril, Naproxen sodium, Penicillins, Shellfish allergy, and Simvastatin   History: Past Medical History:  Diagnosis Date  . AICD (automatic cardioverter/defibrillator) present   . Allergic state 05/17/2016   Dr Ernst Bowler asthma and allergy specialist.  . Allergy   . Arthritis  left knee pain, MRI in 2008-tricompartmental  degenerate changes, mucoid degeneration of ACL, post horn meniscal tear and ant horn meniscal tear  . Asthma   . Breast cancer in female [facility]) 1998/04/12   left; S/P lumpectomy, radiation and chemotherapy  .  CHF (congestive heart failure) ([place])   . Colon polyp    unclear pathology  . Hyperlipidemia   . Hypertension   . LBBB (left bundle branch block)   . Low back pain    left L3-4 injected  . Lymphedema of left arm   . Nonischemic cardiomyopathy ([place])   . Presence of permanent cardiac pacemaker   . Preventative health care 05/18/2016  . Travel sickness 05/12/2015  . Unspecified constipation 04/22/2012  . Vertigo    Past Surgical History:  Procedure Laterality Date  . APPENDECTOMY  April 12, 2007   Dr. Lucia Gaskins  . BI-VENTRICULAR IMPLANTABLE CARDIOVERTER DEFIBRILLATOR N/A 11/16/2013   Procedure: BI-VENTRICULAR IMPLANTABLE CARDIOVERTER DEFIBRILLATOR  (CRT-D);  Surgeon: Evans Lance, MD;  Location: [facility] CATH LAB;  Service: Cardiovascular;  Laterality: N/A;  . BI-VENTRICULAR IMPLANTABLE CARDIOVERTER DEFIBRILLATOR  (CRT-D)  11/16/13   MDT CRTD implanted by Dr Lovena Le for NICM, CHF, and LBBB  . BREAST BIOPSY Left 12-Apr-1998  . BREAST LUMPECTOMY Left 04/12/98  . CARDIAC CATHETERIZATION  ~ 04/11/08   "no blockage"  . CARPAL TUNNEL RELEASE Bilateral 04/11/2005   Dr. Daylene Katayama  . COLONOSCOPY    . KNEE ARTHROSCOPY Right   . POLYPECTOMY    . TONSILLECTOMY    . TRIGGER FINGER RELEASE Bilateral April 11, 2005  . VAGINAL HYSTERECTOMY     Paritial   Family History  Problem Relation Age of Onset  . Heart attack Mother 38  . Diabetes Sister   . Diabetes Brother   . Coronary artery disease Other        Female first degree relative <60  . Hyperlipidemia Other   . Hypertension Other   . Cancer Other        prostate, 1st degree relative <50  . Colon cancer Maternal Aunt   . Allergic rhinitis Neg Hx   . Angioedema Neg Hx   . Asthma Neg Hx   . Eczema Neg Hx   . Immunodeficiency Neg Hx   . Urticaria Neg Hx   . Stomach cancer Neg Hx   . Rectal cancer Neg Hx   . Esophageal cancer Neg Hx   . Liver cancer Neg Hx   . Colon polyps Neg Hx    Social History   Socioeconomic History  . Marital status: Widowed    Spouse name: Not on file  .  Number of children: 2  . Years of education: 50  . Highest education level: Not on file  Occupational History    Employer: DISABLED  Tobacco Use  . Smoking status: Former Smoker    Packs/day: 0.10    Years: 2.00    Pack years: 0.20    Types: Cigarettes    Quit date: 01/18/1970    Years since quitting: 49.6  . Smokeless tobacco: Never Used  . Tobacco comment: "smoked maybe 1 pack/month when I did smoke  Vaping Use  . Vaping Use: Never used  Substance and Sexual Activity  . Alcohol use: No  . Drug use: No  . Sexual activity: Never    Birth control/protection: Surgical, Post-menopausal  Other Topics Concern  . 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 Difficulty of Paying Living Expenses: Not hard at all  Food Insecurity: No Food Insecurity  . Worried About Charity fundraiser in the Last Year: Never true  . Ran Out of Food in the Last Year: Never true  Transportation Needs: No Transportation Needs  . Lack of Transportation (Medical): No  . Lack of Transportation (Non-Medical): No  Physical Activity:   . Days of Exercise per Week:   . Minutes of Exercise per Session:   Stress:   . Feeling of Stress :   Social Connections:   . Frequency of Communication with Friends and Family:   . Frequency of Social Gatherings with Friends and Family:   . Attends Religious Services:   . Active Member of Clubs or Organizations:   . Attends Archivist Meetings:   Marland Kitchen Marital Status:     Tobacco Counseling Counseling given: Not Answered Comment: "smoked maybe 1 pack/month when I did smoke   Clinical Intake: Pain : No/denies pain    Activities of Daily  Living In your present state of health, do you have any difficulty performing the following activities: 08/13/2019  Hearing? N  Vision? N  Difficulty concentrating or making decisions? N  Walking or climbing stairs? N  Dressing or bathing? N  Doing errands, shopping? N  Preparing Food and eating ? N  Using the Toilet? N  In the past six months, have you accidently leaked urine? N  Do you have problems with loss of bowel control? N  Managing your Medications? N  Managing your Finances? N  Some recent data might be hidden    Patient Care Team: Mosie Lukes, MD as PCP - General (Family Medicine) Evans Lance, MD as Consulting Physician (Cardiology) Milus Banister, MD as Consulting Physician (Gastroenterology) Ernst Bowler Gwenith Daily, MD as Consulting Physician (Allergy and Immunology) [name], MD as Consulting Physician (Oncology) Lelon Perla, MD as Consulting Physician (Cardiology) Melissa Montane, MD as Consulting Physician (Otolaryngology)  Indicate any recent Medical Services you may have received from other than [facility] providers in the past year (date may be approximate).     Assessment:   This is a routine wellness examination for Molli.  Dietary issues and exercise activities discussed: Current Exercise Habits: The patient does not participate in regular exercise at present, Exercise limited by: None identified Diet (meal preparation, eat out, water intake, caffeinated beverages, dairy products, fruits and vegetables): well balanced  Goals    . Increase physical activity    . Weight (lb) < 200 lb (90.7 kg)      Depression Screen PHQ 2/9 Scores 05/22/2019 05/17/2016 05/12/2015  PHQ - 2 Score 0 0 0  PHQ- 9 Score 2 - -    Fall Risk Fall Risk  08/13/2019 05/17/2016 09/29/2015 05/12/2015 10/07/2014  Falls in the past year? 0 No No No No  Number falls in past yr: 0 - - - -  Injury with Fall? 0 - - - -  Follow up Education provided;Falls prevention discussed -  - - -   Lives alone in 2 story home. Any stairs in or around the home? Yes  If so, are there any without handrails? No  Home free of loose throw rugs in walkways, pet beds, electrical cords, etc? Yes  Adequate lighting in your home to reduce risk of falls? Yes   ASSISTIVE DEVICES UTILIZED TO PREVENT FALLS:   none  Cognitive Function: Ad8 score reviewed for issues:  Issues making decisions:no  Less interest in hobbies / activities:no  Repeats questions, stories (family complaining):no  Trouble using ordinary gadgets (microwave, computer, phone):no  Forgets the month or year: no  Mismanaging finances: no  Remembering appts:no  Daily problems with thinking and/or memory:no Ad8 score is=0     MMSE - Mini Mental State Exam 05/17/2016  Orientation to time 5  Orientation to Place 5  Registration 3  Attention/ Calculation 5  Recall 2  Language- name 2 objects 2  Language- repeat 1  Language- follow 3 step command 3  Language- read & follow direction 1  Write a sentence 1  Copy design 1  Total score 29        Immunizations Immunization History  Administered Date(s) Administered  . PFIZER SARS-COV-2 Vaccination 02/24/2019, 03/17/2019  . Td 09/06/2006    TDAP status: Due, Education has been provided regarding the importance of this vaccine. Advised may receive this vaccine at local pharmacy or Health Dept. Aware to provide a copy of the vaccination record if obtained from local pharmacy or Health Dept. Verbalized acceptance and understanding. Covid-19 vaccine status: Completed vaccines  Screening Tests Health Maintenance  Topic Date Due  . FOOT EXAM  Never done  . PNA vac Low Risk Adult (1 of 2 - PCV13) Never done  . TETANUS/TDAP  09/05/2016  . URINE MICROALBUMIN  05/10/2017  . INFLUENZA VACCINE  08/19/2019  . OPHTHALMOLOGY EXAM  09/20/2019  . HEMOGLOBIN A1C  11/22/2019  . MAMMOGRAM  09/19/2020  . COLONOSCOPY  01/14/2022  . DEXA SCAN  Completed  . COVID-19  Vaccine  Completed  . Hepatitis C Screening  Completed    Health Maintenance  Health Maintenance Due  Topic Date Due  . FOOT EXAM  Never done  . PNA vac Low Risk Adult (1 of 2 - PCV13) Never done  . TETANUS/TDAP  09/05/2016  . URINE MICROALBUMIN  05/10/2017    Colorectal cancer screening: Completed 01/14/17. Repeat every 5 years Mammogram status: Completed 09/20/18. Repeat every year Bone Density status: Ordered today. Pt provided with contact info and advised to call to schedule appt.  Lung Cancer Screening: (Low Dose CT Chest recommended if Age 43-80 years, 30 pack-year currently smoking OR have quit w/in 15years.) does not qualify.    Additional Screening:   Vision Screening: Recommended annual ophthalmology exams for early detection of glaucoma and other disorders of the eye. Is the patient up to date with their annual eye exam?  Yes     Dental Screening: Recommended annual dental exams for proper oral hygiene  Community Resource Referral / Chronic Care Management: CRR required this visit?  No   CCM required this visit?  No      Plan:    Please schedule your next medicare wellness visit with me in 1 yr.  Continue to eat heart healthy diet (full of fruits, vegetables, whole grains, lean protein, water--limit salt, fat, and sugar intake) and increase physical activity as tolerated.  Continue doing brain stimulating activities (puzzles, reading, adult coloring books, staying active) to keep memory sharp.   Bring a copy of your living will and/or healthcare power of attorney to your next office visit.  I have ordered your bone density. Please  schedule.   I have personally reviewed and noted the following in the patient's chart:   . Medical and social history . Use of alcohol, tobacco or illicit drugs  . Current medications and supplements . Functional ability and status . Nutritional status . Physical activity . Advanced directives . List of other  physicians . Hospitalizations, surgeries, and ER visits in previous 12 months . Vitals . Screenings to include cognitive, depression, and falls . Referrals and appointments  In addition, I have reviewed and discussed with patient certain preventive protocols, quality metrics, and best practice recommendations. A written personalized care plan for preventive services as well as general preventive health recommendations were provided to patient.   Due to this being a telephonic visit, the after visit summary with patients personalized plan was offered to patient via mail or my-chart.  Patient would like to access on my-chart   Shela Nevin, [place]   08/13/2019

## 2019-08-13 ENCOUNTER — Other Ambulatory Visit: Payer: Self-pay

## 2019-08-13 ENCOUNTER — Ambulatory Visit (INDEPENDENT_AMBULATORY_CARE_PROVIDER_SITE_OTHER): Payer: Medicare HMO | Admitting: *Deleted

## 2019-08-13 ENCOUNTER — Encounter: Payer: Self-pay | Admitting: *Deleted

## 2019-08-13 DIAGNOSIS — Z780 Asymptomatic menopausal state: Secondary | ICD-10-CM

## 2019-08-13 DIAGNOSIS — Z0000 Encounter for general adult medical examination without abnormal findings: Secondary | ICD-10-CM

## 2019-08-13 NOTE — Patient Instructions (Signed)
Please schedule your next medicare wellness visit with me in 1 yr.  Continue to eat heart healthy diet (full of fruits, vegetables, whole grains, lean protein, water--limit salt, fat, and sugar intake) and increase physical activity as tolerated.  Continue doing brain stimulating activities (puzzles, reading, adult coloring books, staying active) to keep memory sharp.   Bring a copy of your living will and/or healthcare power of attorney to your next office visit.  I have ordered your bone density. Please schedule.    Ms. Brenda Marsh , Thank you for taking time to come for your Medicare Wellness Visit. I appreciate your ongoing commitment to your health goals. Please review the following plan we discussed and let me know if I can assist you in the future.   These are the goals we discussed: Goals    . Increase physical activity    . Weight (lb) < 200 lb (90.7 kg)       This is a list of the screening recommended for you and due dates:  Health Maintenance  Topic Date Due  . Complete foot exam   Never done  . Pneumonia vaccines (1 of 2 - PCV13) Never done  . Tetanus Vaccine  09/05/2016  . Urine Protein Check  05/10/2017  . Flu Shot  08/19/2019  . Eye exam for diabetics  09/20/2019  . Hemoglobin A1C  11/22/2019  . Mammogram  09/19/2020  . Colon Cancer Screening  01/14/2022  . DEXA scan (bone density measurement)  Completed  . COVID-19 Vaccine  Completed  .  Hepatitis C: One time screening is recommended by Center for Disease Control  (CDC) for  adults born from [birth year] through 1965.   Completed    Preventive Care 42 Years and Older, Female Preventive care refers to lifestyle choices and visits with your health care provider that can promote health and wellness. This includes:  A yearly physical exam. This is also called an annual well check.  Regular dental and eye exams.  Immunizations.  Screening for certain conditions.  Healthy lifestyle choices, such as diet and  exercise. What can I expect for my preventive care visit? Physical exam Your health care provider will check:  Height and weight. These may be used to calculate body mass index (BMI), which is a measurement that tells if you are at a healthy weight.  Heart rate and blood pressure.  Your skin for abnormal spots. Counseling Your health care provider may ask you questions about:  Alcohol, tobacco, and drug use.  Emotional well-being.  Home and relationship well-being.  Sexual activity.  Eating habits.  History of falls.  Memory and ability to understand (cognition).  Work and work Statistician.  Pregnancy and menstrual history. What immunizations do I need?  Influenza (flu) vaccine  This is recommended every year. Tetanus, diphtheria, and pertussis (Tdap) vaccine  You may need a Td booster every 10 years. Varicella (chickenpox) vaccine  You may need this vaccine if you have not already been vaccinated. Zoster (shingles) vaccine  You may need this after age 74. Pneumococcal conjugate (PCV13) vaccine  One dose is recommended after age 32. Pneumococcal polysaccharide (PPSV23) vaccine  One dose is recommended after age 16. Measles, mumps, and rubella (MMR) vaccine  You may need at least one dose of MMR if you were born in 1957 or later. You may also need a second dose. Meningococcal conjugate (MenACWY) vaccine  You may need this if you have certain conditions. Hepatitis A vaccine  You may need this  if you have certain conditions or if you travel or work in places where you may be exposed to hepatitis A. Hepatitis B vaccine  You may need this if you have certain conditions or if you travel or work in places where you may be exposed to hepatitis B. Haemophilus influenzae type b (Hib) vaccine  You may need this if you have certain conditions. You may receive vaccines as individual doses or as more than one vaccine together in one shot (combination vaccines). Talk  with your health care provider about the risks and benefits of combination vaccines. What tests do I need? Blood tests  Lipid and cholesterol levels. These may be checked every 5 years, or more frequently depending on your overall health.  Hepatitis C test.  Hepatitis B test. Screening  Lung cancer screening. You may have this screening every year starting at age 25 if you have a 30-pack-year history of smoking and currently smoke or have quit within the past 15 years.  Colorectal cancer screening. All adults should have this screening starting at age 51 and continuing until age 94. Your health care provider may recommend screening at age 85 if you are at increased risk. You will have tests every 1-10 years, depending on your results and the type of screening test.  Diabetes screening. This is done by checking your blood sugar (glucose) after you have not eaten for a while (fasting). You may have this done every 1-3 years.  Mammogram. This may be done every 1-2 years. Talk with your health care provider about how often you should have regular mammograms.  BRCA-related cancer screening. This may be done if you have a family history of breast, ovarian, tubal, or peritoneal cancers. Other tests  Sexually transmitted disease (STD) testing.  Bone density scan. This is done to screen for osteoporosis. You may have this done starting at age 45. Follow these instructions at home: Eating and drinking  Eat a diet that includes fresh fruits and vegetables, whole grains, lean protein, and low-fat dairy products. Limit your intake of foods with high amounts of sugar, saturated fats, and salt.  Take vitamin and mineral supplements as recommended by your health care provider.  Do not drink alcohol if your health care provider tells you not to drink.  If you drink alcohol: ? Limit how much you have to 0-1 drink a day. ? Be aware of how much alcohol is in your drink. In the [place] one drink equals  one 12 oz bottle of beer (355 mL), one 5 oz glass of wine (148 mL), or one 1 oz glass of hard liquor (44 mL). Lifestyle  Take daily care of your teeth and gums.  Stay active. Exercise for at least 30 minutes on 5 or more days each week.  Do not use any products that contain nicotine or tobacco, such as cigarettes, e-cigarettes, and chewing tobacco. If you need help quitting, ask your health care provider.  If you are sexually active, practice safe sex. Use a condom or other form of protection in order to prevent STIs (sexually transmitted infections).  Talk with your health care provider about taking a low-dose aspirin or statin. What's next?  Go to your health care provider once a year for a well check visit.  Ask your health care provider how often you should have your eyes and teeth checked.  Stay up to date on all vaccines. This information is not intended to replace advice given to you by your health care  provider. Make sure you discuss any questions you have with your health care provider. Document Revised: 12/29/2017 Document Reviewed: 12/29/2017 Elsevier Patient Education  2020 [employer].

## 2019-08-22 ENCOUNTER — Other Ambulatory Visit: Payer: Self-pay | Admitting: Cardiology

## 2019-08-22 NOTE — Telephone Encounter (Signed)
*  STAT* If patient is at the pharmacy, call can be transferred to refill team.   1. Which medications need to be refilled? (please list name of each medication and dose if known)  bisoprolol (ZEBETA) 5 MG tablet  2. Which pharmacy/location (including street and city if local pharmacy) is medication to be sent to?  [id number] - [place], [place] - [address] AT [address]OSS [address]  3. Do they need a 30 day or 90 day supply? 90   Patient states she is having a hard time getting [facility]. She only has two days of pills left and does not want to run out

## 2019-08-22 NOTE — Telephone Encounter (Signed)
Tried to reach patient about refill. Left patient a message to call back.

## 2019-08-29 ENCOUNTER — Ambulatory Visit (INDEPENDENT_AMBULATORY_CARE_PROVIDER_SITE_OTHER): Payer: Medicare HMO | Admitting: *Deleted

## 2019-08-29 DIAGNOSIS — I428 Other cardiomyopathies: Secondary | ICD-10-CM | POA: Diagnosis not present

## 2019-08-29 LAB — CUP PACEART REMOTE DEVICE CHECK
Battery Remaining Longevity: 10 mo
Battery Voltage: 2.81 V
Brady Statistic AP VP Percent: 0.05 %
Brady Statistic AP VS Percent: 0.02 %
Brady Statistic AS VP Percent: 98.58 %
Brady Statistic AS VS Percent: 1.36 %
Brady Statistic RA Percent Paced: 0.06 %
Brady Statistic RV Percent Paced: 86.24 %
Date Time Interrogation Session: 20210811033624
HighPow Impedance: 75 Ohm
Implantable Lead Implant Date: 20151030
Implantable Lead Implant Date: 20151030
Implantable Lead Implant Date: 20151030
Implantable Lead Location: 753858
Implantable Lead Location: 753859
Implantable Lead Location: 753860
Implantable Lead Model: 4598
Implantable Lead Model: 5076
Implantable Lead Model: 6935
Implantable Pulse Generator Implant Date: 20151030
Lead Channel Impedance Value: 1178 Ohm
Lead Channel Impedance Value: 1311 Ohm
Lead Channel Impedance Value: 1406 Ohm
Lead Channel Impedance Value: 323 Ohm
Lead Channel Impedance Value: 437 Ohm
Lead Channel Impedance Value: 437 Ohm
Lead Channel Impedance Value: 456 Ohm
Lead Channel Impedance Value: 627 Ohm
Lead Channel Impedance Value: 665 Ohm
Lead Channel Impedance Value: 836 Ohm
Lead Channel Impedance Value: 855 Ohm
Lead Channel Impedance Value: 950 Ohm
Lead Channel Impedance Value: 988 Ohm
Lead Channel Pacing Threshold Amplitude: 0.625 V
Lead Channel Pacing Threshold Amplitude: 0.875 V
Lead Channel Pacing Threshold Amplitude: 2.75 V
Lead Channel Pacing Threshold Pulse Width: 0.4 ms
Lead Channel Pacing Threshold Pulse Width: 0.4 ms
Lead Channel Pacing Threshold Pulse Width: 1 ms
Lead Channel Sensing Intrinsic Amplitude: 12.625 mV
Lead Channel Sensing Intrinsic Amplitude: 12.625 mV
Lead Channel Sensing Intrinsic Amplitude: 3.875 mV
Lead Channel Sensing Intrinsic Amplitude: 3.875 mV
Lead Channel Setting Pacing Amplitude: 2 V
Lead Channel Setting Pacing Amplitude: 2 V
Lead Channel Setting Pacing Amplitude: 2.5 V
Lead Channel Setting Pacing Pulse Width: 0.4 ms
Lead Channel Setting Pacing Pulse Width: 1 ms
Lead Channel Setting Sensing Sensitivity: 0.3 mV

## 2019-08-30 ENCOUNTER — Telehealth: Payer: Self-pay | Admitting: *Deleted

## 2019-08-30 NOTE — Telephone Encounter (Signed)
LMOM (DPR) requesting call back to DC to discuss ICD battery status. Per 08/29/19, remaining battery longevity 10 months. Will enroll in monthly battery checks. Also noted LV threshold measuring higher than programmed output. Will offer DC appointment for lead testing and possible reprogramming.

## 2019-08-31 NOTE — Telephone Encounter (Signed)
Pt returned phone call.  Advised of ICD battery status and need for monthly battery checks.    Also discussed LV lead threshold and need for in person testing.  Pt scheduled for device clinic appt on 8/19 9:30am

## 2019-09-03 NOTE — Progress Notes (Signed)
Remote ICD transmission.   

## 2019-09-06 ENCOUNTER — Other Ambulatory Visit: Payer: Self-pay

## 2019-09-06 ENCOUNTER — Ambulatory Visit (INDEPENDENT_AMBULATORY_CARE_PROVIDER_SITE_OTHER): Payer: Medicare HMO | Admitting: Emergency Medicine

## 2019-09-06 DIAGNOSIS — I428 Other cardiomyopathies: Secondary | ICD-10-CM

## 2019-09-06 DIAGNOSIS — R55 Syncope and collapse: Secondary | ICD-10-CM | POA: Diagnosis not present

## 2019-09-06 DIAGNOSIS — I5042 Chronic combined systolic (congestive) and diastolic (congestive) heart failure: Secondary | ICD-10-CM

## 2019-09-06 DIAGNOSIS — Z95810 Presence of automatic (implantable) cardiac defibrillator: Secondary | ICD-10-CM

## 2019-09-06 LAB — CUP PACEART INCLINIC DEVICE CHECK
Battery Remaining Longevity: 9 mo
Battery Voltage: 2.83 V
Brady Statistic AP VP Percent: 0.04 %
Brady Statistic AP VS Percent: 0.02 %
Brady Statistic AS VP Percent: 98.59 %
Brady Statistic AS VS Percent: 1.35 %
Brady Statistic RA Percent Paced: 0.06 %
Brady Statistic RV Percent Paced: 84.27 %
Date Time Interrogation Session: 20210819100000
HighPow Impedance: 72 Ohm
Implantable Lead Implant Date: 20151030
Implantable Lead Implant Date: 20151030
Implantable Lead Implant Date: 20151030
Implantable Lead Location: 753858
Implantable Lead Location: 753859
Implantable Lead Location: 753860
Implantable Lead Model: 4598
Implantable Lead Model: 5076
Implantable Lead Model: 6935
Implantable Pulse Generator Implant Date: 20151030
Lead Channel Impedance Value: 1045 Ohm
Lead Channel Impedance Value: 1235 Ohm
Lead Channel Impedance Value: 1368 Ohm
Lead Channel Impedance Value: 1406 Ohm
Lead Channel Impedance Value: 323 Ohm
Lead Channel Impedance Value: 456 Ohm
Lead Channel Impedance Value: 456 Ohm
Lead Channel Impedance Value: 456 Ohm
Lead Channel Impedance Value: 646 Ohm
Lead Channel Impedance Value: 703 Ohm
Lead Channel Impedance Value: 893 Ohm
Lead Channel Impedance Value: 893 Ohm
Lead Channel Impedance Value: 988 Ohm
Lead Channel Pacing Threshold Amplitude: 0.75 V
Lead Channel Pacing Threshold Amplitude: 0.75 V
Lead Channel Pacing Threshold Amplitude: 1.25 V
Lead Channel Pacing Threshold Pulse Width: 0.4 ms
Lead Channel Pacing Threshold Pulse Width: 0.4 ms
Lead Channel Pacing Threshold Pulse Width: 1 ms
Lead Channel Sensing Intrinsic Amplitude: 16.25 mV
Lead Channel Sensing Intrinsic Amplitude: 5.25 mV
Lead Channel Setting Pacing Amplitude: 2 V
Lead Channel Setting Pacing Amplitude: 2 V
Lead Channel Setting Pacing Amplitude: 2.5 V
Lead Channel Setting Pacing Pulse Width: 0.4 ms
Lead Channel Setting Pacing Pulse Width: 1 ms
Lead Channel Setting Sensing Sensitivity: 0.3 mV

## 2019-09-06 NOTE — Progress Notes (Signed)
CRT-D device check in office. RA/ RV  thresholds and sensing consistent with previous device measurements. Lead impedance trends stable over time. LV threshold LV2 to LV3 elevated over time , threshold today  2.25 V at 1.00 ms. LV threshold tested and programmed LV2 to RV coil with threshold 1.25 V @ 1.00 ms. No mode switch episodes recorded. No ventricular arrhythmia episodes recorded. Patient bi-ventricularly pacing 92.94% of the time. Device programmed with appropriate safety margins. Heart failure diagnostics reviewed and trends are stable for patient.  Estimated longevity 9 months.  Patient enrolled in remote follow up on monthly remotes due to 9 month battery life.. Plan to check device remotely 10/01/19 Patient education completed including shock plan.

## 2019-09-12 DIAGNOSIS — E119 Type 2 diabetes mellitus without complications: Secondary | ICD-10-CM | POA: Diagnosis not present

## 2019-09-15 ENCOUNTER — Ambulatory Visit: Payer: Medicare HMO | Attending: Internal Medicine

## 2019-09-15 DIAGNOSIS — Z23 Encounter for immunization: Secondary | ICD-10-CM

## 2019-09-15 NOTE — Progress Notes (Signed)
   Covid-19 Vaccination Clinic  Name:  Brenda Marsh    MRN: [id number] DOB: 1945/04/20  09/15/2019  Brenda Marsh was observed post Covid-19 immunization for 15 minutes without incident. She was provided with Vaccine Information Sheet and instruction to access the V-Safe system.   Brenda Marsh was instructed to call 911 with any severe reactions post vaccine: Marland Kitchen Difficulty breathing  . Swelling of face and throat  . A fast heartbeat  . A bad rash all over body  . Dizziness and weakness

## 2019-10-01 ENCOUNTER — Ambulatory Visit: Payer: Medicare HMO | Admitting: *Deleted

## 2019-10-01 ENCOUNTER — Ambulatory Visit (HOSPITAL_BASED_OUTPATIENT_CLINIC_OR_DEPARTMENT_OTHER): Payer: Medicare HMO

## 2019-10-01 ENCOUNTER — Other Ambulatory Visit (HOSPITAL_BASED_OUTPATIENT_CLINIC_OR_DEPARTMENT_OTHER): Payer: Medicare HMO

## 2019-10-01 DIAGNOSIS — I5042 Chronic combined systolic (congestive) and diastolic (congestive) heart failure: Secondary | ICD-10-CM

## 2019-10-01 DIAGNOSIS — I428 Other cardiomyopathies: Secondary | ICD-10-CM

## 2019-10-01 LAB — CUP PACEART REMOTE DEVICE CHECK
Battery Remaining Longevity: 8 mo
Battery Voltage: 2.84 V
Brady Statistic AP VP Percent: 0.04 %
Brady Statistic AP VS Percent: 0.01 %
Brady Statistic AS VP Percent: 98.64 %
Brady Statistic AS VS Percent: 1.31 %
Brady Statistic RA Percent Paced: 0.06 %
Brady Statistic RV Percent Paced: 93.02 %
Date Time Interrogation Session: 20210913052827
HighPow Impedance: 75 Ohm
Implantable Lead Implant Date: 20151030
Implantable Lead Implant Date: 20151030
Implantable Lead Implant Date: 20151030
Implantable Lead Location: 753858
Implantable Lead Location: 753859
Implantable Lead Location: 753860
Implantable Lead Model: 4598
Implantable Lead Model: 5076
Implantable Lead Model: 6935
Implantable Pulse Generator Implant Date: 20151030
Lead Channel Impedance Value: 1102 Ohm
Lead Channel Impedance Value: 1178 Ohm
Lead Channel Impedance Value: 1292 Ohm
Lead Channel Impedance Value: 1349 Ohm
Lead Channel Impedance Value: 342 Ohm
Lead Channel Impedance Value: 437 Ohm
Lead Channel Impedance Value: 437 Ohm
Lead Channel Impedance Value: 456 Ohm
Lead Channel Impedance Value: 627 Ohm
Lead Channel Impedance Value: 760 Ohm
Lead Channel Impedance Value: 836 Ohm
Lead Channel Impedance Value: 912 Ohm
Lead Channel Impedance Value: 950 Ohm
Lead Channel Pacing Threshold Amplitude: 0.625 V
Lead Channel Pacing Threshold Amplitude: 0.75 V
Lead Channel Pacing Threshold Amplitude: 1.375 V
Lead Channel Pacing Threshold Pulse Width: 0.4 ms
Lead Channel Pacing Threshold Pulse Width: 0.4 ms
Lead Channel Pacing Threshold Pulse Width: 1 ms
Lead Channel Sensing Intrinsic Amplitude: 13.625 mV
Lead Channel Sensing Intrinsic Amplitude: 13.625 mV
Lead Channel Sensing Intrinsic Amplitude: 3.625 mV
Lead Channel Sensing Intrinsic Amplitude: 3.625 mV
Lead Channel Setting Pacing Amplitude: 2 V
Lead Channel Setting Pacing Amplitude: 2 V
Lead Channel Setting Pacing Amplitude: 2.5 V
Lead Channel Setting Pacing Pulse Width: 0.4 ms
Lead Channel Setting Pacing Pulse Width: 1 ms
Lead Channel Setting Sensing Sensitivity: 0.3 mV

## 2019-10-03 ENCOUNTER — Ambulatory Visit (HOSPITAL_BASED_OUTPATIENT_CLINIC_OR_DEPARTMENT_OTHER): Payer: Medicare HMO

## 2019-10-03 NOTE — Progress Notes (Signed)
Remote ICD transmission.   

## 2019-10-10 ENCOUNTER — Other Ambulatory Visit: Payer: Medicare HMO

## 2019-10-10 ENCOUNTER — Ambulatory Visit: Payer: Medicare HMO | Admitting: Hematology & Oncology

## 2019-10-10 DIAGNOSIS — H43813 Vitreous degeneration, bilateral: Secondary | ICD-10-CM | POA: Diagnosis not present

## 2019-10-10 DIAGNOSIS — H5203 Hypermetropia, bilateral: Secondary | ICD-10-CM | POA: Diagnosis not present

## 2019-10-10 DIAGNOSIS — H2513 Age-related nuclear cataract, bilateral: Secondary | ICD-10-CM | POA: Diagnosis not present

## 2019-10-10 DIAGNOSIS — H524 Presbyopia: Secondary | ICD-10-CM | POA: Diagnosis not present

## 2019-10-10 DIAGNOSIS — H43393 Other vitreous opacities, bilateral: Secondary | ICD-10-CM | POA: Diagnosis not present

## 2019-10-10 DIAGNOSIS — H18413 Arcus senilis, bilateral: Secondary | ICD-10-CM | POA: Diagnosis not present

## 2019-10-10 DIAGNOSIS — E119 Type 2 diabetes mellitus without complications: Secondary | ICD-10-CM | POA: Diagnosis not present

## 2019-10-10 DIAGNOSIS — H25013 Cortical age-related cataract, bilateral: Secondary | ICD-10-CM | POA: Diagnosis not present

## 2019-10-22 ENCOUNTER — Other Ambulatory Visit: Payer: Medicare HMO

## 2019-10-22 ENCOUNTER — Ambulatory Visit: Payer: Medicare HMO | Admitting: Hematology & Oncology

## 2019-10-29 ENCOUNTER — Ambulatory Visit (HOSPITAL_BASED_OUTPATIENT_CLINIC_OR_DEPARTMENT_OTHER): Payer: Medicare HMO

## 2019-10-29 ENCOUNTER — Encounter (HOSPITAL_BASED_OUTPATIENT_CLINIC_OR_DEPARTMENT_OTHER): Payer: Self-pay

## 2019-10-29 ENCOUNTER — Other Ambulatory Visit: Payer: Self-pay

## 2019-10-29 ENCOUNTER — Ambulatory Visit (HOSPITAL_BASED_OUTPATIENT_CLINIC_OR_DEPARTMENT_OTHER)
Admission: RE | Admit: 2019-10-29 | Discharge: 2019-10-29 | Disposition: A | Discharge status: Home / Self Care | Payer: Medicare HMO | Source: Ambulatory Visit | Attending: Family Medicine | Admitting: Family Medicine

## 2019-10-29 DIAGNOSIS — Z1231 Encounter for screening mammogram for malignant neoplasm of breast: Secondary | ICD-10-CM | POA: Insufficient documentation

## 2019-10-31 ENCOUNTER — Inpatient Hospital Stay: Payer: Medicare HMO | Attending: Hematology & Oncology

## 2019-10-31 ENCOUNTER — Inpatient Hospital Stay (HOSPITAL_BASED_OUTPATIENT_CLINIC_OR_DEPARTMENT_OTHER): Payer: Medicare HMO | Admitting: Hematology & Oncology

## 2019-10-31 ENCOUNTER — Encounter: Payer: Self-pay | Admitting: Hematology & Oncology

## 2019-10-31 ENCOUNTER — Other Ambulatory Visit: Payer: Self-pay

## 2019-10-31 VITALS — BP 128/56 | HR 75 | Temp 98.5°F | Resp 18 | Wt 218.0 lb

## 2019-10-31 DIAGNOSIS — Z853 Personal history of malignant neoplasm of breast: Secondary | ICD-10-CM | POA: Insufficient documentation

## 2019-10-31 LAB — CBC WITH DIFFERENTIAL (CANCER CENTER ONLY)
Abs Immature Granulocytes: 0.02 10*3/uL (ref 0.00–0.07)
Basophils Absolute: 0.1 10*3/uL (ref 0.0–0.1)
Basophils Relative: 1 %
Eosinophils Absolute: 0.3 10*3/uL (ref 0.0–0.5)
Eosinophils Relative: 5 %
HCT: 38.2 % (ref 36.0–46.0)
Hemoglobin: 12.5 g/dL (ref 12.0–15.0)
Immature Granulocytes: 0 %
Lymphocytes Relative: 45 %
Lymphs Abs: 2.9 10*3/uL (ref 0.7–4.0)
MCH: 31.1 pg (ref 26.0–34.0)
MCHC: 32.7 g/dL (ref 30.0–36.0)
MCV: 95 fL (ref 80.0–100.0)
Monocytes Absolute: 0.4 10*3/uL (ref 0.1–1.0)
Monocytes Relative: 6 %
Neutro Abs: 2.8 10*3/uL (ref 1.7–7.7)
Neutrophils Relative %: 43 %
Platelet Count: 190 10*3/uL (ref 150–400)
RBC: 4.02 MIL/uL (ref 3.87–5.11)
RDW: 14 % (ref 11.5–15.5)
WBC Count: 6.4 10*3/uL (ref 4.0–10.5)
nRBC: 0 % (ref 0.0–0.2)

## 2019-10-31 LAB — CMP (CANCER CENTER ONLY)
ALT: 16 U/L (ref 0–44)
AST: 20 U/L (ref 15–41)
Albumin: 4.1 g/dL (ref 3.5–5.0)
Alkaline Phosphatase: 58 U/L (ref 38–126)
Anion gap: 7 (ref 5–15)
BUN: 11 mg/dL (ref 8–23)
CO2: 27 mmol/L (ref 22–32)
Calcium: 10.3 mg/dL (ref 8.9–10.3)
Chloride: 106 mmol/L (ref 98–111)
Creatinine: 0.85 mg/dL (ref 0.44–1.00)
GFR, Estimated: >60 mL/min (ref >60)
Glucose, Bld: 86 mg/dL (ref 70–99)
Potassium: 3.7 mmol/L (ref 3.5–5.1)
Sodium: 140 mmol/L (ref 135–145)
Total Bilirubin: 0.3 mg/dL (ref 0.3–1.2)
Total Protein: 7.2 g/dL (ref 6.5–8.1)

## 2019-10-31 NOTE — Progress Notes (Signed)
Hematology and Oncology Follow Up Visit  LORENZA SHAKIR [id number] 01-Feb-1945 74 y.o. 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 Ferch is here today for follow-up. She comes back for her yearly visit. She is doing quite well.  She has had a good year.  She does not go to have surgery for the right shoulder.  She just does not want to have any additional surgery.  I totally understand that.  There has been no problems with her heart.  She has a defibrillator inside.  This is about 5 years out.  She is getting tested to see how the battery is.  She has had no problems with the COVID.  She has had no change in bowel or bladder habits.  There is been no problems with rashes.  She has had no headache.  She had no nausea or vomiting.  She and her family had a wonderful vacation down to [facility] this year.  They went in July.  She really enjoyed herself.  Overall, I would say her performance status is ECOG 1.    Medications:  Allergies as of 10/31/2019      Reactions   Contrast Media [iodinated Diagnostic Agents] Nausea And Vomiting   N&V   Crestor [rosuvastatin] Other (See Comments)   Myalgia, weakness   Metrizamide Nausea And Vomiting   Atorvastatin Other (See Comments)   Myalgia, weakness   Lisinopril Other (See Comments)   REACTION: cough REACTION: cough REACTION: cough   Naproxen Sodium Other (See Comments)   REACTION: Breathing difficulty   Naproxen Sodium Other (See Comments)   REACTION: Breathing difficulty REACTION: Breathing difficulty   Penicillins Other (See Comments)   Shellfish Allergy Other (See Comments)   Cant breath   Simvastatin Other (See Comments)   REACTION: muscle aches REACTION: muscle aches REACTION: muscle aches      Medication List       Accurate as of October 31, 2019  4:08 PM. If you have any questions, ask  your nurse or doctor.        bisoprolol 5 MG tablet Commonly known as: ZEBETA TAKE 1/2 TABLET (2.5 MG TOTAL) BY MOUTH DAILY.   meclizine 12.5 MG tablet Commonly known as: ANTIVERT Take 1 tablet (12.5 mg total) by mouth 3 (three) times daily as needed for dizziness.   multivitamin capsule Take 1 capsule by mouth at bedtime.   ProAir HFA 108 (90 Base) MCG/ACT inhaler Generic drug: albuterol Inhale 2 puffs into the lungs every 4 (four) hours as needed for wheezing or shortness of breath.   spironolactone 25 MG tablet Commonly known as: ALDACTONE Take 0.5 tablets (12.5 mg total) by mouth daily.   VITAMIN B COMPLEX PO Take 1 tablet by mouth daily.   vitamin C 500 MG tablet Commonly known as: ASCORBIC ACID Take 500 mg by mouth daily.   Vitamin D 400 units capsule Take 400 Units by mouth daily.   VITAMIN E PO Take by mouth as directed.       Allergies:   Past Medical History, Surgical history, Social history, and Family History were reviewed and updated.  Review of Systems: Review of Systems  Constitutional: Negative.   HENT: Negative.   Eyes: Negative.   Respiratory: Negative.   Cardiovascular: Negative.   Gastrointestinal: Negative.   Genitourinary: Negative.   Musculoskeletal: Negative.   Skin: Negative.  Neurological: Negative.   Endo/Heme/Allergies: Negative.   Psychiatric/Behavioral: Negative.      Physical Exam:  weight is 218 lb (98.9 kg). Her oral temperature is 98.5 F (36.9 C). Her blood pressure is 128/56 (abnormal) and her pulse is 75. Her respiration is 18 and oxygen saturation is 100%.   Wt Readings from Last 3 Encounters:  10/31/19 218 lb (98.9 kg)  05/22/19 221 lb (100.2 kg)  03/23/19 226 lb (102.5 kg)    Physical Exam Vitals reviewed.  Constitutional:      Comments: Breast exam shows her right breast with no masses, edema or erythema.  There is no right axillary adenopathy.  Left breast is slightly contracted from surgery and  radiation.  She has a well-healed lumpectomy scar at the 2 o'clock position.  There is some firmness at the lumpectomy site.  She has no distinct mass in the left breast.  There is no left axillary adenopathy.  HENT:     Head: Normocephalic and atraumatic.  Eyes:     Pupils: Pupils are equal, round, and reactive to light.  Cardiovascular:     Rate and Rhythm: Normal rate and regular rhythm.     Heart sounds: Normal heart sounds.  Pulmonary:     Effort: Pulmonary effort is normal.     Breath sounds: Normal breath sounds.  Abdominal:     General: Bowel sounds are normal.     Palpations: Abdomen is soft.  Musculoskeletal:        General: No tenderness or deformity. Normal range of motion.     Cervical back: Normal range of motion.     Comments: There is lymphedema in the left arm.  This is moderate.  There is no erythema or warmth.  She has good range of motion of the left arm.  Lymphadenopathy:     Cervical: No cervical adenopathy.  Skin:    General: Skin is warm and dry.     Findings: No erythema or rash.  Neurological:     Mental Status: She is alert and oriented to person, place, and time.  Psychiatric:        Behavior: Behavior normal.        Thought Content: Thought content normal.        Judgment: Judgment normal.     Lab Results  Component Value Date   WBC 6.4 10/31/2019   HGB 12.5 10/31/2019   HCT 38.2 10/31/2019   MCV 95.0 10/31/2019   PLT 190 10/31/2019   Lab Results  Component Value Date   IRON 56 12/21/2012   TIBC 429 12/21/2012   UIBC 373 12/21/2012   IRONPCTSAT 13 (L) 12/21/2012   Lab Results  Component Value Date   RBC 4.02 10/31/2019   No results found for: KPAFRELGTCHN, LAMBDASER, KAPLAMBRATIO No results found for: IGGSERUM, IGA, IGMSERUM No results found for: Odetta Pink, SPEI   Chemistry      Component Value Date/Time   NA 140 10/31/2019 1454   NA 140 08/21/2018 1129   NA 143 09/29/2016  1500   K 3.7 10/31/2019 1454   K 3.8 09/29/2016 1500   CL 106 10/31/2019 1454   CL 105 09/29/2016 1500   CO2 27 10/31/2019 1454   CO2 28 09/29/2016 1500   BUN 11 10/31/2019 1454   BUN 9 08/21/2018 1129   BUN 12 09/29/2016 1500   CREATININE 0.85 10/31/2019 1454   CREATININE 0.9 09/29/2016 1500      Component  Value Date/Time   CALCIUM 10.3 10/31/2019 1454   CALCIUM 9.7 09/29/2016 1500   ALKPHOS 58 10/31/2019 1454   ALKPHOS 81 09/29/2016 1500   AST 20 10/31/2019 1454   ALT 16 10/31/2019 1454   ALT 23 09/29/2016 1500   BILITOT 0.3 10/31/2019 1454     Impression and Plan: Ms. Broeker is a 74 year old African-American female with stage IIA ductal carcinoma the left breast. She had 1 lymph node positive. Her tumor was ER positive. She was on tamoxifen.  She is approximate 19 years out from treatment.  We will continue to follow her along yearly.  I am so glad that her heart is doing okay.  I am does have a that there was no problems with her heart since we last saw her.  If she ever does agree to have surgery for the shoulder, I do not see any problems from my point of view.  I would not think that there would be any problems from cardiology also.  We will plan for another follow-up in 1 year.    [name], MD 10/13/20214:08 PM

## 2019-11-01 ENCOUNTER — Ambulatory Visit (INDEPENDENT_AMBULATORY_CARE_PROVIDER_SITE_OTHER): Payer: Medicare HMO

## 2019-11-01 DIAGNOSIS — I428 Other cardiomyopathies: Secondary | ICD-10-CM | POA: Diagnosis not present

## 2019-11-01 LAB — LACTATE DEHYDROGENASE: LDH: 236 U/L — ABNORMAL HIGH (ref 98–192)

## 2019-11-03 LAB — CUP PACEART REMOTE DEVICE CHECK
Battery Remaining Longevity: 7 mo
Battery Voltage: 2.83 V
Brady Statistic AP VP Percent: 0.04 %
Brady Statistic AP VS Percent: 0.01 %
Brady Statistic AS VP Percent: 98.62 %
Brady Statistic AS VS Percent: 1.33 %
Brady Statistic RA Percent Paced: 0.05 %
Brady Statistic RV Percent Paced: 94.43 %
Date Time Interrogation Session: 20211014213427
HighPow Impedance: 78 Ohm
Implantable Lead Implant Date: 20151030
Implantable Lead Implant Date: 20151030
Implantable Lead Implant Date: 20151030
Implantable Lead Location: 753858
Implantable Lead Location: 753859
Implantable Lead Location: 753860
Implantable Lead Model: 4598
Implantable Lead Model: 5076
Implantable Lead Model: 6935
Implantable Pulse Generator Implant Date: 20151030
Lead Channel Impedance Value: 1102 Ohm
Lead Channel Impedance Value: 1273 Ohm
Lead Channel Impedance Value: 1406 Ohm
Lead Channel Impedance Value: 1463 Ohm
Lead Channel Impedance Value: 380 Ohm
Lead Channel Impedance Value: 399 Ohm
Lead Channel Impedance Value: 437 Ohm
Lead Channel Impedance Value: 456 Ohm
Lead Channel Impedance Value: 665 Ohm
Lead Channel Impedance Value: 817 Ohm
Lead Channel Impedance Value: 893 Ohm
Lead Channel Impedance Value: 950 Ohm
Lead Channel Impedance Value: 969 Ohm
Lead Channel Pacing Threshold Amplitude: 0.625 V
Lead Channel Pacing Threshold Amplitude: 0.75 V
Lead Channel Pacing Threshold Amplitude: 1.625 V
Lead Channel Pacing Threshold Pulse Width: 0.4 ms
Lead Channel Pacing Threshold Pulse Width: 0.4 ms
Lead Channel Pacing Threshold Pulse Width: 1 ms
Lead Channel Sensing Intrinsic Amplitude: 14.125 mV
Lead Channel Sensing Intrinsic Amplitude: 14.125 mV
Lead Channel Sensing Intrinsic Amplitude: 3.75 mV
Lead Channel Sensing Intrinsic Amplitude: 3.75 mV
Lead Channel Setting Pacing Amplitude: 2 V
Lead Channel Setting Pacing Amplitude: 2 V
Lead Channel Setting Pacing Amplitude: 2.5 V
Lead Channel Setting Pacing Pulse Width: 0.4 ms
Lead Channel Setting Pacing Pulse Width: 1 ms
Lead Channel Setting Sensing Sensitivity: 0.3 mV

## 2019-11-06 NOTE — Progress Notes (Signed)
Remote ICD transmission.   

## 2019-11-19 ENCOUNTER — Ambulatory Visit (INDEPENDENT_AMBULATORY_CARE_PROVIDER_SITE_OTHER): Payer: Medicare HMO | Admitting: Family Medicine

## 2019-11-19 ENCOUNTER — Other Ambulatory Visit: Payer: Self-pay

## 2019-11-19 ENCOUNTER — Other Ambulatory Visit (HOSPITAL_COMMUNITY)
Admission: RE | Admit: 2019-11-19 | Discharge: 2019-11-19 | Disposition: A | Discharge status: Home / Self Care | Payer: Medicare HMO | Source: Ambulatory Visit | Attending: Family Medicine | Admitting: Family Medicine

## 2019-11-19 ENCOUNTER — Encounter: Payer: Self-pay | Admitting: Family Medicine

## 2019-11-19 DIAGNOSIS — I10 Essential (primary) hypertension: Secondary | ICD-10-CM

## 2019-11-19 DIAGNOSIS — H9202 Otalgia, left ear: Secondary | ICD-10-CM

## 2019-11-19 DIAGNOSIS — Z124 Encounter for screening for malignant neoplasm of cervix: Secondary | ICD-10-CM

## 2019-11-19 DIAGNOSIS — E559 Vitamin D deficiency, unspecified: Secondary | ICD-10-CM

## 2019-11-19 DIAGNOSIS — E119 Type 2 diabetes mellitus without complications: Secondary | ICD-10-CM | POA: Diagnosis not present

## 2019-11-19 DIAGNOSIS — Z853 Personal history of malignant neoplasm of breast: Secondary | ICD-10-CM | POA: Diagnosis not present

## 2019-11-19 DIAGNOSIS — E782 Mixed hyperlipidemia: Secondary | ICD-10-CM

## 2019-11-19 NOTE — Assessment & Plan Note (Signed)
hgba1c acceptable, minimize simple carbs. Increase exercise as tolerated.  

## 2019-11-19 NOTE — Assessment & Plan Note (Signed)
She notes her upper pinna is sore at times. Feels well today. Encouraged to cleanse the area with H2O2 daily prn.

## 2019-11-19 NOTE — Progress Notes (Signed)
Subjective:    Patient ID: Brenda Marsh, female    DOB: [birth year], 74 y.o.   MRN: [id number]  Chief Complaint  Patient presents with  . Follow-up    HPI Patient is in today for follow up on chronic medical concerns. No recent febrile illness or hospitalizations. She has been stressed over the health of her daughters. One daughter has had a double mastectomy for breast cancer after diagnosed with early Parkinson's and her other daughter just had knee surgery. The patient herself feels well. Denies CP/palp/SOB/HA/congestion/fevers/GI or GU c/o. Taking meds as prescribed  Past Medical History:  Diagnosis Date  . AICD (automatic cardioverter/defibrillator) present   . Allergic state 05/17/2016   Dr Ernst Bowler asthma and allergy specialist.  . Allergy   . Arthritis    left knee pain, MRI in 2008-tricompartmental  degenerate changes, mucoid degeneration of ACL, post horn meniscal tear and ant horn meniscal tear  . Asthma   . Breast cancer in female [facility]) 2000   left; S/P lumpectomy, radiation and chemotherapy  . CHF (congestive heart failure) ([place])   . Colon polyp    unclear pathology  . Hyperlipidemia   . Hypertension   . LBBB (left bundle branch block)   . Low back pain    left L3-4 injected  . Lymphedema of left arm   . Nonischemic cardiomyopathy ([place])   . Presence of permanent cardiac pacemaker   . Preventative health care 05/18/2016  . Travel sickness 05/12/2015  . Unspecified constipation 04/22/2012  . Vertigo     Past Surgical History:  Procedure Laterality Date  . APPENDECTOMY  2009   Dr. Lucia Gaskins  . BI-VENTRICULAR IMPLANTABLE CARDIOVERTER DEFIBRILLATOR N/A 11/16/2013   Procedure: BI-VENTRICULAR IMPLANTABLE CARDIOVERTER DEFIBRILLATOR  (CRT-D);  Surgeon: Evans Lance, MD;  Location: [facility] CATH LAB;  Service: Cardiovascular;  Laterality: N/A;  . BI-VENTRICULAR IMPLANTABLE CARDIOVERTER DEFIBRILLATOR  (CRT-D)  11/16/13   MDT CRTD implanted by Dr Lovena Le for NICM, CHF, and  LBBB  . BREAST BIOPSY Left 2000  . BREAST LUMPECTOMY Left 2000  . CARDIAC CATHETERIZATION  ~ 2010   "no blockage"  . CARPAL TUNNEL RELEASE Bilateral 2007   Dr. Daylene Katayama  . COLONOSCOPY    . KNEE ARTHROSCOPY Right   . POLYPECTOMY    . TONSILLECTOMY    . TRIGGER FINGER RELEASE Bilateral 2007  . VAGINAL HYSTERECTOMY     Paritial    Family History  Problem Relation Age of Onset  . Heart attack Mother 81  . Diabetes Sister   . Diabetes Brother   . Coronary artery disease Other        Female first degree relative <60  . Hyperlipidemia Other   . Hypertension Other   . Cancer Other        prostate, 1st degree relative <50  . Coronary artery disease Maternal Grandmother   . Colon cancer Maternal Aunt   . Cancer Daughter   . Allergic rhinitis Neg Hx   . Angioedema Neg Hx   . Asthma Neg Hx   . Eczema Neg Hx   . Immunodeficiency Neg Hx   . Urticaria Neg Hx   . Stomach cancer Neg Hx   . Rectal cancer Neg Hx   . Esophageal cancer Neg Hx   . Liver cancer Neg Hx   . Colon polyps Neg Hx     Social History   Socioeconomic History  . Marital status: Widowed    Spouse name: Not on file  . Number  of children: 2  . Years of education: 64  . Highest education level: Not on file  Occupational History    Employer: DISABLED  Tobacco Use  . Smoking status: Former Smoker    Packs/day: 0.10    Years: 2.00    Pack years: 0.20    Types: Cigarettes    Quit date: 01/18/1970    Years since quitting: 49.8  . Smokeless tobacco: Never Used  . Tobacco comment: "smoked maybe 1 pack/month when I did smoke  Vaping Use  . Vaping Use: Never used  Substance and Sexual Activity  . Alcohol use: No  . Drug use: No  . Sexual activity: Never    Birth control/protection: Surgical, Post-menopausal  Other Topics Concern  . Not on file  Social History Narrative   Widow - husband died in Apr 20, 2003   Occupation - retired from working in Radiation protection practitioner   2 daughter - on lives in [place], other in [place]    Remote history of tobacco but none in 40 years   Caffeine- coffee, 1 cup daily; 1 energy drink daily (V8)   Social Determinants of Health   Financial Resource Strain: Low Risk   . Difficulty of Paying Living Expenses: Not hard at all  Food Insecurity: No Food Insecurity  . Worried About Charity fundraiser in the Last Year: Never true  . Ran Out of Food in the Last Year: Never true  Transportation Needs: No Transportation Needs  . Lack of Transportation (Medical): No  . Lack of Transportation (Non-Medical): No  Physical Activity:   . Days of Exercise per Week: Not on file  . Minutes of Exercise per Session: Not on file  Stress:   . Feeling of Stress : Not on file  Social Connections:   . Frequency of Communication with Friends and Family: Not on file  . Frequency of Social Gatherings with Friends and Family: Not on file  . Attends Religious Services: Not on file  . Active Member of Clubs or Organizations: Not on file  . Attends Archivist Meetings: Not on file  . Marital Status: Not on file  Intimate Partner Violence:   . Fear of Current or Ex-Partner: Not on file  . Emotionally Abused: Not on file  . Physically Abused: Not on file  . Sexually Abused: Not on file    Outpatient Medications Prior to Visit  Medication Sig Dispense Refill  . albuterol (PROAIR HFA) 108 (90 Base) MCG/ACT inhaler Inhale 2 puffs into the lungs every 4 (four) hours as needed for wheezing or shortness of breath.     . B Complex Vitamins (VITAMIN B COMPLEX PO) Take 1 tablet by mouth daily.     . bisoprolol (ZEBETA) 5 MG tablet TAKE 1/2 TABLET (2.5 MG TOTAL) BY MOUTH DAILY. 45 tablet 3  . Cholecalciferol (VITAMIN D) 400 UNITS capsule Take 400 Units by mouth daily.    . meclizine (ANTIVERT) 12.5 MG tablet Take 1 tablet (12.5 mg total) by mouth 3 (three) times daily as needed for dizziness. 90 tablet 3  . Multiple Vitamin (MULTIVITAMIN) capsule Take 1 capsule by mouth at bedtime.     Marland Kitchen  spironolactone (ALDACTONE) 25 MG tablet Take 0.5 tablets (12.5 mg total) by mouth daily. 45 tablet 3  . vitamin C (ASCORBIC ACID) 500 MG tablet Take 500 mg by mouth daily.    Marland Kitchen VITAMIN E PO Take by mouth as directed.     No facility-administered medications prior to visit.  Allergies  Allergen Reactions  . Contrast Media [Iodinated Diagnostic Agents] Nausea And Vomiting    N&V  . Crestor [Rosuvastatin] Other (See Comments)    Myalgia, weakness  . Metrizamide Nausea And Vomiting  . Atorvastatin Other (See Comments)    Myalgia, weakness  . Lisinopril Other (See Comments)    REACTION: cough REACTION: cough REACTION: cough  . Naproxen Sodium Other (See Comments)    REACTION: Breathing difficulty  . Naproxen Sodium Other (See Comments)    REACTION: Breathing difficulty REACTION: Breathing difficulty  . Penicillins Other (See Comments)  . Shellfish Allergy Other (See Comments)    Cant breath   . Simvastatin Other (See Comments)    REACTION: muscle aches REACTION: muscle aches REACTION: muscle aches    Review of Systems  Constitutional: Negative for fever and malaise/fatigue.  HENT: Negative for congestion.   Eyes: Negative for blurred vision.  Respiratory: Negative for cough and shortness of breath.   Cardiovascular: Negative for chest pain, palpitations and leg swelling.  Gastrointestinal: Negative for blood in stool, constipation, diarrhea and vomiting.  Genitourinary: Negative for dysuria and urgency.  Musculoskeletal: Negative for back pain.  Skin: Negative for rash.  Neurological: Negative for loss of consciousness and headaches.       Objective:    Physical Exam Vitals and nursing note reviewed.  Constitutional:      General: She is not in acute distress.    Appearance: She is well-developed.  HENT:     Head: Normocephalic and atraumatic.     Nose: Nose normal.  Eyes:     General:        Right eye: No discharge.        Left eye: No discharge.    Cardiovascular:     Rate and Rhythm: Normal rate and regular rhythm.     Heart sounds: No murmur heard.   Pulmonary:     Effort: Pulmonary effort is normal.     Breath sounds: Normal breath sounds.  Abdominal:     General: Bowel sounds are normal.     Palpations: Abdomen is soft.     Tenderness: There is no abdominal tenderness.  Genitourinary:    General: Normal vulva.     Vagina: No vaginal discharge.     Rectum: Normal.  Musculoskeletal:     Cervical back: Normal range of motion and neck supple.  Skin:    General: Skin is warm and dry.  Neurological:     Mental Status: She is alert and oriented to person, place, and time.     There were no vitals taken for this visit. Wt Readings from Last 3 Encounters:  10/31/19 218 lb (98.9 kg)  05/22/19 221 lb (100.2 kg)  03/23/19 226 lb (102.5 kg)    Diabetic Foot Exam - Simple   No data filed     Lab Results  Component Value Date   WBC 6.4 10/31/2019   HGB 12.5 10/31/2019   HCT 38.2 10/31/2019   PLT 190 10/31/2019   GLUCOSE 86 10/31/2019   CHOL 281 (H) 05/22/2019   TRIG 109.0 05/22/2019   HDL 43.40 05/22/2019   LDLCALC 216 (H) 05/22/2019   ALT 16 10/31/2019   AST 20 10/31/2019   NA 140 10/31/2019   K 3.7 10/31/2019   CL 106 10/31/2019   CREATININE 0.85 10/31/2019   BUN 11 10/31/2019   CO2 27 10/31/2019   TSH 0.70 05/22/2019   HGBA1C 5.8 05/22/2019   MICROALBUR <0.7 05/10/2016  Lab Results  Component Value Date   TSH 0.70 05/22/2019   Lab Results  Component Value Date   WBC 6.4 10/31/2019   HGB 12.5 10/31/2019   HCT 38.2 10/31/2019   MCV 95.0 10/31/2019   PLT 190 10/31/2019   Lab Results  Component Value Date   NA 140 10/31/2019   K 3.7 10/31/2019   CO2 27 10/31/2019   GLUCOSE 86 10/31/2019   BUN 11 10/31/2019   CREATININE 0.85 10/31/2019   BILITOT 0.3 10/31/2019   ALKPHOS 58 10/31/2019   AST 20 10/31/2019   ALT 16 10/31/2019   PROT 7.2 10/31/2019   ALBUMIN 4.1 10/31/2019   CALCIUM 10.3  10/31/2019   ANIONGAP 7 10/31/2019   GFR 81.34 05/22/2019   Lab Results  Component Value Date   CHOL 281 (H) 05/22/2019   Lab Results  Component Value Date   HDL 43.40 05/22/2019   Lab Results  Component Value Date   LDLCALC 216 (H) 05/22/2019   Lab Results  Component Value Date   TRIG 109.0 05/22/2019   Lab Results  Component Value Date   CHOLHDL 6 05/22/2019   Lab Results  Component Value Date   HGBA1C 5.8 05/22/2019       Assessment & Plan:   Problem List Items Addressed This Visit    Hyperlipidemia, mixed - Primary (Chronic)    Encouraged heart healthy diet, increase exercise, avoid trans fats, consider a krill oil cap daily      Relevant Orders   Lipid panel   Essential hypertension (Chronic)    Well controlled, no changes to meds. Encouraged heart healthy diet such as the DASH diet and exercise as tolerated.       Relevant Orders   TSH   Diabetes mellitus type 2, diet-controlled ([place])    hgba1c acceptable, minimize simple carbs. Increase exercise as tolerated.       Relevant Orders   Hemoglobin A1c   History of breast cancer (Chronic)    Now follows with Dr Marin Olp      Cervical cancer screening    Pap today, no concerns on exam. Provided pap is normal she will not require further pelvic exams unless she develops a symptoms      Relevant Orders   Cytology - PAP( [facility])   Left ear pain    She notes her upper pinna is sore at times. Feels well today. Encouraged to cleanse the area with H2O2 daily prn.        Other Visit Diagnoses    Vitamin D deficiency       Relevant Orders   VITAMIN D 25 Hydroxy (Vit-D Deficiency, Fractures)      I am having [name] maintain her multivitamin, Vitamin D, albuterol, vitamin C, meclizine, VITAMIN E PO, B Complex Vitamins (VITAMIN B COMPLEX PO), bisoprolol, and spironolactone.  No orders of the defined types were placed in this encounter.    [name], MD

## 2019-11-19 NOTE — Assessment & Plan Note (Addendum)
Pap today, no concerns on exam. Provided pap is normal she will not require further pelvic exams unless she develops a symptoms

## 2019-11-19 NOTE — Assessment & Plan Note (Signed)
Now follows with Dr Marin Olp

## 2019-11-19 NOTE — Assessment & Plan Note (Signed)
Encouraged heart healthy diet, increase exercise, avoid trans fats, consider a krill oil cap daily 

## 2019-11-19 NOTE — Patient Instructions (Signed)

## 2019-11-19 NOTE — Assessment & Plan Note (Signed)
Well controlled, no changes to meds. Encouraged heart healthy diet such as the DASH diet and exercise as tolerated.  °

## 2019-11-20 LAB — CYTOLOGY - PAP: Diagnosis: NEGATIVE

## 2019-11-22 ENCOUNTER — Other Ambulatory Visit: Payer: Self-pay

## 2019-11-22 ENCOUNTER — Other Ambulatory Visit (INDEPENDENT_AMBULATORY_CARE_PROVIDER_SITE_OTHER): Payer: Medicare HMO

## 2019-11-22 DIAGNOSIS — I10 Essential (primary) hypertension: Secondary | ICD-10-CM

## 2019-11-22 DIAGNOSIS — E782 Mixed hyperlipidemia: Secondary | ICD-10-CM

## 2019-11-22 DIAGNOSIS — E559 Vitamin D deficiency, unspecified: Secondary | ICD-10-CM

## 2019-11-22 DIAGNOSIS — E119 Type 2 diabetes mellitus without complications: Secondary | ICD-10-CM

## 2019-11-23 ENCOUNTER — Other Ambulatory Visit: Payer: Medicare HMO

## 2019-11-23 LAB — LIPID PANEL
Cholesterol: 288 mg/dL — ABNORMAL HIGH (ref <200)
HDL: 43 mg/dL — ABNORMAL LOW (ref >=50)
LDL Cholesterol (Calc): 196 mg/dL (calc) — ABNORMAL HIGH
Non-HDL Cholesterol (Calc): 245 mg/dL (calc) — ABNORMAL HIGH (ref <130)
Total CHOL/HDL Ratio: 6.7 (calc) — ABNORMAL HIGH (ref <5.0)
Triglycerides: 277 mg/dL — ABNORMAL HIGH (ref <150)

## 2019-11-23 LAB — HEMOGLOBIN A1C
Hgb A1c MFr Bld: 5.5 % of total Hgb (ref <5.7)
Mean Plasma Glucose: 111 (calc)
eAG (mmol/L): 6.2 (calc)

## 2019-11-23 LAB — TSH: TSH: 0.79 mIU/L (ref 0.40–4.50)

## 2019-11-23 LAB — VITAMIN D 25 HYDROXY (VIT D DEFICIENCY, FRACTURES): Vit D, 25-Hydroxy: 76 ng/mL (ref 30–100)

## 2019-11-28 ENCOUNTER — Ambulatory Visit (INDEPENDENT_AMBULATORY_CARE_PROVIDER_SITE_OTHER): Payer: Medicare HMO

## 2019-11-28 DIAGNOSIS — I428 Other cardiomyopathies: Secondary | ICD-10-CM

## 2019-11-28 LAB — CUP PACEART REMOTE DEVICE CHECK
Battery Remaining Longevity: 7 mo
Battery Voltage: 2.83 V
Brady Statistic AP VP Percent: 0.04 %
Brady Statistic AP VS Percent: 0.01 %
Brady Statistic AS VP Percent: 98.62 %
Brady Statistic AS VS Percent: 1.33 %
Brady Statistic RA Percent Paced: 0.05 %
Brady Statistic RV Percent Paced: 93.65 %
Date Time Interrogation Session: 20211110012304
HighPow Impedance: 75 Ohm
Implantable Lead Implant Date: 20151030
Implantable Lead Implant Date: 20151030
Implantable Lead Implant Date: 20151030
Implantable Lead Location: 753858
Implantable Lead Location: 753859
Implantable Lead Location: 753860
Implantable Lead Model: 4598
Implantable Lead Model: 5076
Implantable Lead Model: 6935
Implantable Pulse Generator Implant Date: 20151030
Lead Channel Impedance Value: 1102 Ohm
Lead Channel Impedance Value: 1216 Ohm
Lead Channel Impedance Value: 1349 Ohm
Lead Channel Impedance Value: 1425 Ohm
Lead Channel Impedance Value: 323 Ohm
Lead Channel Impedance Value: 399 Ohm
Lead Channel Impedance Value: 437 Ohm
Lead Channel Impedance Value: 494 Ohm
Lead Channel Impedance Value: 646 Ohm
Lead Channel Impedance Value: 779 Ohm
Lead Channel Impedance Value: 855 Ohm
Lead Channel Impedance Value: 950 Ohm
Lead Channel Impedance Value: 969 Ohm
Lead Channel Pacing Threshold Amplitude: 0.625 V
Lead Channel Pacing Threshold Amplitude: 0.75 V
Lead Channel Pacing Threshold Amplitude: 1.5 V
Lead Channel Pacing Threshold Pulse Width: 0.4 ms
Lead Channel Pacing Threshold Pulse Width: 0.4 ms
Lead Channel Pacing Threshold Pulse Width: 1 ms
Lead Channel Sensing Intrinsic Amplitude: 14.25 mV
Lead Channel Sensing Intrinsic Amplitude: 14.25 mV
Lead Channel Sensing Intrinsic Amplitude: 3.75 mV
Lead Channel Sensing Intrinsic Amplitude: 3.75 mV
Lead Channel Setting Pacing Amplitude: 2 V
Lead Channel Setting Pacing Amplitude: 2 V
Lead Channel Setting Pacing Amplitude: 2.5 V
Lead Channel Setting Pacing Pulse Width: 0.4 ms
Lead Channel Setting Pacing Pulse Width: 1 ms
Lead Channel Setting Sensing Sensitivity: 0.3 mV

## 2019-11-29 NOTE — Progress Notes (Signed)
Remote ICD transmission.   

## 2020-01-03 ENCOUNTER — Ambulatory Visit (INDEPENDENT_AMBULATORY_CARE_PROVIDER_SITE_OTHER): Payer: Medicare HMO

## 2020-01-03 DIAGNOSIS — I428 Other cardiomyopathies: Secondary | ICD-10-CM

## 2020-01-03 LAB — CUP PACEART REMOTE DEVICE CHECK
Battery Remaining Longevity: 5 mo
Battery Voltage: 2.81 V
Brady Statistic AP VP Percent: 0.04 %
Brady Statistic AP VS Percent: 0.01 %
Brady Statistic AS VP Percent: 98.58 %
Brady Statistic AS VS Percent: 1.37 %
Brady Statistic RA Percent Paced: 0.05 %
Brady Statistic RV Percent Paced: 94.68 %
Date Time Interrogation Session: 20211216043823
HighPow Impedance: 78 Ohm
Implantable Lead Implant Date: 20151030
Implantable Lead Implant Date: 20151030
Implantable Lead Implant Date: 20151030
Implantable Lead Location: 753858
Implantable Lead Location: 753859
Implantable Lead Location: 753860
Implantable Lead Model: 4598
Implantable Lead Model: 5076
Implantable Lead Model: 6935
Implantable Pulse Generator Implant Date: 20151030
Lead Channel Impedance Value: 1140 Ohm
Lead Channel Impedance Value: 1216 Ohm
Lead Channel Impedance Value: 1311 Ohm
Lead Channel Impedance Value: 1406 Ohm
Lead Channel Impedance Value: 342 Ohm
Lead Channel Impedance Value: 399 Ohm
Lead Channel Impedance Value: 456 Ohm
Lead Channel Impedance Value: 456 Ohm
Lead Channel Impedance Value: 646 Ohm
Lead Channel Impedance Value: 779 Ohm
Lead Channel Impedance Value: 855 Ohm
Lead Channel Impedance Value: 950 Ohm
Lead Channel Impedance Value: 988 Ohm
Lead Channel Pacing Threshold Amplitude: 0.625 V
Lead Channel Pacing Threshold Amplitude: 1 V
Lead Channel Pacing Threshold Amplitude: 1.375 V
Lead Channel Pacing Threshold Pulse Width: 0.4 ms
Lead Channel Pacing Threshold Pulse Width: 0.4 ms
Lead Channel Pacing Threshold Pulse Width: 1 ms
Lead Channel Sensing Intrinsic Amplitude: 14.5 mV
Lead Channel Sensing Intrinsic Amplitude: 14.5 mV
Lead Channel Sensing Intrinsic Amplitude: 3.875 mV
Lead Channel Sensing Intrinsic Amplitude: 3.875 mV
Lead Channel Setting Pacing Amplitude: 2 V
Lead Channel Setting Pacing Amplitude: 2 V
Lead Channel Setting Pacing Amplitude: 2.5 V
Lead Channel Setting Pacing Pulse Width: 0.4 ms
Lead Channel Setting Pacing Pulse Width: 1 ms
Lead Channel Setting Sensing Sensitivity: 0.3 mV

## 2020-01-17 NOTE — Progress Notes (Signed)
Remote ICD transmission.   

## 2020-01-24 ENCOUNTER — Other Ambulatory Visit: Payer: Self-pay

## 2020-01-24 DIAGNOSIS — I5022 Chronic systolic (congestive) heart failure: Secondary | ICD-10-CM

## 2020-01-24 MED ORDER — BISOPROLOL FUMARATE 5 MG PO TABS
ORAL_TABLET | ORAL | 3 refills | Status: DC
Start: 2020-01-24 — End: 2020-02-04

## 2020-01-24 MED ORDER — SPIRONOLACTONE 25 MG PO TABS: 12.5000 mg | ORAL_TABLET | Freq: Every day | ORAL | 3 refills | Status: DC

## 2020-02-04 ENCOUNTER — Ambulatory Visit (INDEPENDENT_AMBULATORY_CARE_PROVIDER_SITE_OTHER): Payer: Medicare HMO

## 2020-02-04 ENCOUNTER — Telehealth: Payer: Self-pay | Admitting: Cardiology

## 2020-02-04 DIAGNOSIS — I428 Other cardiomyopathies: Secondary | ICD-10-CM

## 2020-02-04 DIAGNOSIS — I5022 Chronic systolic (congestive) heart failure: Secondary | ICD-10-CM

## 2020-02-04 MED ORDER — BISOPROLOL FUMARATE 5 MG PO TABS: ORAL_TABLET | ORAL | 1 refills | Status: DC

## 2020-02-04 NOTE — Telephone Encounter (Signed)
Refill sent to pt's pharmacy. 

## 2020-02-04 NOTE — Telephone Encounter (Signed)
°*  STAT* If patient is at the pharmacy, call can be transferred to refill team.   1. Which medications need to be refilled? (please list name of each medication and dose if known)  bisoprolol (ZEBETA) 5 MG tablet spironolactone (ALDACTONE) 25 MG tablet  2. Which pharmacy/location (including street and city if local pharmacy) is medication to be sent to?  [place], [place]  3. Do they need a 30 day or 90 day supply? 90   Patient is scheduled to see Dr. Stanford Breed in [facility] 04/09/20

## 2020-02-05 LAB — CUP PACEART REMOTE DEVICE CHECK
Battery Remaining Longevity: 5 mo
Battery Voltage: 2.81 V
Brady Statistic AP VP Percent: 0.03 %
Brady Statistic AP VS Percent: 0.01 %
Brady Statistic AS VP Percent: 98.66 %
Brady Statistic AS VS Percent: 1.3 %
Brady Statistic RA Percent Paced: 0.05 %
Brady Statistic RV Percent Paced: 93.36 %
Date Time Interrogation Session: 20220117022603
HighPow Impedance: 74 Ohm
Implantable Lead Implant Date: 20151030
Implantable Lead Implant Date: 20151030
Implantable Lead Implant Date: 20151030
Implantable Lead Location: 753858
Implantable Lead Location: 753859
Implantable Lead Location: 753860
Implantable Lead Model: 4598
Implantable Lead Model: 5076
Implantable Lead Model: 6935
Implantable Pulse Generator Implant Date: 20151030
Lead Channel Impedance Value: 1102 Ohm
Lead Channel Impedance Value: 1178 Ohm
Lead Channel Impedance Value: 1349 Ohm
Lead Channel Impedance Value: 1406 Ohm
Lead Channel Impedance Value: 323 Ohm
Lead Channel Impedance Value: 399 Ohm
Lead Channel Impedance Value: 437 Ohm
Lead Channel Impedance Value: 456 Ohm
Lead Channel Impedance Value: 646 Ohm
Lead Channel Impedance Value: 779 Ohm
Lead Channel Impedance Value: 836 Ohm
Lead Channel Impedance Value: 950 Ohm
Lead Channel Impedance Value: 988 Ohm
Lead Channel Pacing Threshold Amplitude: 0.625 V
Lead Channel Pacing Threshold Amplitude: 1 V
Lead Channel Pacing Threshold Amplitude: 1.375 V
Lead Channel Pacing Threshold Pulse Width: 0.4 ms
Lead Channel Pacing Threshold Pulse Width: 0.4 ms
Lead Channel Pacing Threshold Pulse Width: 1 ms
Lead Channel Sensing Intrinsic Amplitude: 15.5 mV
Lead Channel Sensing Intrinsic Amplitude: 15.5 mV
Lead Channel Sensing Intrinsic Amplitude: 4.375 mV
Lead Channel Sensing Intrinsic Amplitude: 4.375 mV
Lead Channel Setting Pacing Amplitude: 2 V
Lead Channel Setting Pacing Amplitude: 2 V
Lead Channel Setting Pacing Amplitude: 2.5 V
Lead Channel Setting Pacing Pulse Width: 0.4 ms
Lead Channel Setting Pacing Pulse Width: 1 ms
Lead Channel Setting Sensing Sensitivity: 0.3 mV

## 2020-02-16 NOTE — Progress Notes (Signed)
Remote ICD transmission.   

## 2020-02-16 NOTE — Addendum Note (Signed)
Addended by: Douglass Rivers D on: 02/16/2020 09:36 PM   Modules accepted: Level of Service

## 2020-03-05 ENCOUNTER — Ambulatory Visit (INDEPENDENT_AMBULATORY_CARE_PROVIDER_SITE_OTHER): Payer: Medicare HMO

## 2020-03-05 DIAGNOSIS — I428 Other cardiomyopathies: Secondary | ICD-10-CM

## 2020-03-11 NOTE — Progress Notes (Signed)
Remote ICD transmission.   

## 2020-03-26 ENCOUNTER — Other Ambulatory Visit: Payer: Self-pay

## 2020-03-26 ENCOUNTER — Telehealth: Payer: Self-pay | Admitting: Cardiology

## 2020-03-26 DIAGNOSIS — I5022 Chronic systolic (congestive) heart failure: Secondary | ICD-10-CM

## 2020-03-26 MED ORDER — BISOPROLOL FUMARATE 5 MG PO TABS: ORAL_TABLET | ORAL | 1 refills | Status: DC

## 2020-03-26 MED ORDER — SPIRONOLACTONE 25 MG PO TABS: 12.5000 mg | ORAL_TABLET | Freq: Every day | ORAL | 1 refills | Status: DC

## 2020-03-26 NOTE — Telephone Encounter (Signed)
*  STAT* If patient is at the pharmacy, call can be transferred to refill team.   1. Which medications need to be refilled? (please list name of each medication and dose if known)  spironolactone (ALDACTONE) 25 MG tablet bisoprolol (ZEBETA) 5 MG tablet  2. Which pharmacy/location (including street and city if local pharmacy) is medication to be sent to? [place], [place]  3. Do they need a 30 day or 90 day supply? 90 day   Patient has 3 pills left.

## 2020-04-01 ENCOUNTER — Encounter: Payer: Self-pay | Admitting: Internal Medicine

## 2020-04-01 ENCOUNTER — Ambulatory Visit: Payer: Medicare HMO | Admitting: Internal Medicine

## 2020-04-01 ENCOUNTER — Other Ambulatory Visit: Payer: Self-pay

## 2020-04-01 VITALS — BP 130/64 | HR 79 | Ht 65.0 in | Wt 217.0 lb

## 2020-04-01 DIAGNOSIS — Z95810 Presence of automatic (implantable) cardiac defibrillator: Secondary | ICD-10-CM

## 2020-04-01 DIAGNOSIS — I10 Essential (primary) hypertension: Secondary | ICD-10-CM | POA: Diagnosis not present

## 2020-04-01 DIAGNOSIS — I428 Other cardiomyopathies: Secondary | ICD-10-CM | POA: Diagnosis not present

## 2020-04-01 NOTE — Progress Notes (Signed)
HPI Mrs. Brenda Marsh returns today for followup. She is a pleasant 75 yo woman with breast CA,s/p chemotherapy, LV dysfunction on maximal medical therapy, s/p biv ICD insertion. She is about 3 months from reaching ERI. She denies chest pain or sob. Her lymphedema is better.  Allergies  Allergen Reactions  . Contrast Media [Iodinated Diagnostic Agents] Nausea And Vomiting    N&V  . Crestor [Rosuvastatin] Other (See Comments)    Myalgia, weakness  . Metrizamide Nausea And Vomiting  . Atorvastatin Other (See Comments)    Myalgia, weakness  . Lisinopril Other (See Comments)    REACTION: cough REACTION: cough REACTION: cough  . Naproxen Sodium Other (See Comments)    REACTION: Breathing difficulty  . Naproxen Sodium Other (See Comments)    REACTION: Breathing difficulty REACTION: Breathing difficulty  . Penicillins Other (See Comments)  . Shellfish Allergy Other (See Comments)    Cant breath   . Simvastatin Other (See Comments)    REACTION: muscle aches REACTION: muscle aches REACTION: muscle aches     Current Outpatient Medications  Medication Sig Dispense Refill  . albuterol (VENTOLIN HFA) 108 (90 Base) MCG/ACT inhaler Inhale 2 puffs into the lungs every 4 (four) hours as needed for wheezing or shortness of breath.     . bisoprolol (ZEBETA) 5 MG tablet TAKE 1/2 TABLET (2.5 MG TOTAL) BY MOUTH DAILY. Please keep upcoming appt for future refills. 45 tablet 1  . Cholecalciferol (VITAMIN D) 400 UNITS capsule Take 400 Units by mouth daily.    . meclizine (ANTIVERT) 12.5 MG tablet Take 1 tablet (12.5 mg total) by mouth 3 (three) times daily as needed for dizziness. 90 tablet 3  . Multiple Vitamin (MULTIVITAMIN) capsule Take 1 capsule by mouth at bedtime.     Marland Kitchen spironolactone (ALDACTONE) 25 MG tablet Take 0.5 tablets (12.5 mg total) by mouth daily. Please keep upcoming appt for future refills 45 tablet 1  . vitamin C (ASCORBIC ACID) 500 MG tablet Take 500 mg by mouth daily.    Marland Kitchen  VITAMIN E PO Take by mouth as directed.     No current facility-administered medications for this visit.     Past Medical History:  Diagnosis Date  . AICD (automatic cardioverter/defibrillator) present   . Allergic state 05/17/2016   Dr Ernst Bowler asthma and allergy specialist.  . Allergy   . Arthritis    left knee pain, MRI in 2008-tricompartmental  degenerate changes, mucoid degeneration of ACL, post horn meniscal tear and ant horn meniscal tear  . Asthma   . Breast cancer in female [facility]) 2000   left; S/P lumpectomy, radiation and chemotherapy  . CHF (congestive heart failure) ([place])   . Colon polyp    unclear pathology  . Hyperlipidemia   . Hypertension   . LBBB (left bundle branch block)   . Low back pain    left L3-4 injected  . Lymphedema of left arm   . Nonischemic cardiomyopathy ([place])   . Presence of permanent cardiac pacemaker   . Preventative health care 05/18/2016  . Travel sickness 05/12/2015  . Unspecified constipation 04/22/2012  . Vertigo     ROS:   All systems reviewed and negative except as noted in the HPI.   Past Surgical History:  Procedure Laterality Date  . APPENDECTOMY  2009   Dr. Lucia Gaskins  . BI-VENTRICULAR IMPLANTABLE CARDIOVERTER DEFIBRILLATOR N/A 11/16/2013   Procedure: BI-VENTRICULAR IMPLANTABLE CARDIOVERTER DEFIBRILLATOR  (CRT-D);  Surgeon: Evans Lance, MD;  Location[facility] CATH  LAB;  Service: Cardiovascular;  Laterality: N/A;  . BI-VENTRICULAR IMPLANTABLE CARDIOVERTER DEFIBRILLATOR  (CRT-D)  11/16/13   MDT CRTD implanted by Dr Lovena Le for NICM, CHF, and LBBB  . BREAST BIOPSY Left Apr 15, 1998  . BREAST LUMPECTOMY Left 15-Apr-1998  . CARDIAC CATHETERIZATION  ~ 04/14/08   "no blockage"  . CARPAL TUNNEL RELEASE Bilateral Apr 14, 2005   Dr. Daylene Katayama  . COLONOSCOPY    . KNEE ARTHROSCOPY Right   . POLYPECTOMY    . TONSILLECTOMY    . TRIGGER FINGER RELEASE Bilateral 04-14-05  . VAGINAL HYSTERECTOMY     Paritial     Family History  Problem Relation Age of Onset  . Heart  attack Mother 25  . Diabetes Sister   . Diabetes Brother   . Coronary artery disease Other        Female first degree relative <60  . Hyperlipidemia Other   . Hypertension Other   . Cancer Other        prostate, 1st degree relative <50  . Coronary artery disease Maternal Grandmother   . Colon cancer Maternal Aunt   . Cancer Daughter   . Allergic rhinitis Neg Hx   . Angioedema Neg Hx   . Asthma Neg Hx   . Eczema Neg Hx   . Immunodeficiency Neg Hx   . Urticaria Neg Hx   . Stomach cancer Neg Hx   . Rectal cancer Neg Hx   . Esophageal cancer Neg Hx   . Liver cancer Neg Hx   . Colon polyps Neg Hx      Social History   Socioeconomic History  . Marital status: Widowed    Spouse name: Not on file  . Number of children: 2  . Years of education: 33  . Highest education level: Not on file  Occupational History    Employer: DISABLED  Tobacco Use  . Smoking status: Former Smoker    Packs/day: 0.10    Years: 2.00    Pack years: 0.20    Types: Cigarettes    Quit date: 01/18/1970    Years since quitting: 50.2  . Smokeless tobacco: Never Used  . Tobacco comment: "smoked maybe 1 pack/month when I did smoke  Vaping Use  . Vaping Use: Never used  Substance and Sexual Activity  . Alcohol use: No  . Drug use: No  . Sexual activity: Never    Birth control/protection: Surgical, Post-menopausal  Other Topics Concern  . Not on file  Social History Narrative   Widow - husband died in 04-15-2003   Occupation - retired from working in Radiation protection practitioner   2 daughter - on lives in [place], other in [place]   Remote history of tobacco but none in 40 years   Caffeine- coffee, 1 cup daily; 1 energy drink daily (V8)   Social Determinants of Health   Financial Resource Strain: Low Risk   . Difficulty of Paying Living Expenses: Not hard at all  Food Insecurity: No Food Insecurity  . Worried About Charity fundraiser in the Last Year: Never true  . Ran Out of Food in the Last Year: Never true   Transportation Needs: No Transportation Needs  . Lack of Transportation (Medical): No  . Lack of Transportation (Non-Medical): No  Physical Activity: Not on file  Stress: Not on file  Social Connections: Not on file  Intimate Partner Violence: Not on file     BP 130/64   Pulse 79   Ht 5\' 5"  (1.651 m)  Wt 217 lb (98.4 kg)   SpO2 90%   BMI 36.11 kg/m   Physical Exam:  Well appearing NAD HEENT: Unremarkable Neck:  No JVD, no thyromegally Lymphatics:  No adenopathy Back:  No CVA tenderness Lungs:  Clear with no wheezes HEART:  Regular rate rhythm, no murmurs, no rubs, no clicks Abd:  soft, positive bowel sounds, no organomegally, no rebound, no guarding Ext:  2 plus pulses, no edema, no cyanosis, no clubbing Skin:  No rashes no nodules Neuro:  CN II through XII intact, motor grossly intact  EKG - AV pacing   DEVICE  Normal device function.  See PaceArt for details.   Assess/Plan: 1. Chronic systolic heart failure - she is class 2. She will continue her current meds.  2. Obesity - I encouraged the patient to increase her physical activity and eat less.Her weight is down almost 10 lbs since her last visit. 3. ICD - she is 3 months from ERI. Her LV lead threshold is elevated but stable.  4. HTN - her bp is elevated a bit but is better at home. She is encouraged to reduce her salt intake.  Carleene Overlie Taylor,MD

## 2020-04-01 NOTE — Patient Instructions (Signed)
Medication Instructions:  Your physician recommends that you continue on your current medications as directed. Please refer to the Current Medication list given to you today.  Labwork: None ordered.  Testing/Procedures: None ordered.  Follow-Up: Your physician wants you to follow-up in: one year with Cristopher Peru, MD or one of the following Advanced Practice Providers on your designated Care Team:    Chanetta Marshall, NP  Tommye Standard, PA-C  Legrand Como "Jonni Sanger" [place], [place]  Remote monitoring is used to monitor your ICD from home. This monitoring reduces the number of office visits required to check your device to one time per year. It allows [place] to keep an eye on the functioning of your device to ensure it is working properly. You are scheduled for a device check from home on 04/07/2020. You may send your transmission at any time that day. If you have a wireless device, the transmission will be sent automatically. After your physician reviews your transmission, you will receive a postcard with your next transmission date.  Any Other Special Instructions Will Be Listed Below (If Applicable).  If you need a refill on your cardiac medications before your next appointment, please call your pharmacy.

## 2020-04-07 ENCOUNTER — Ambulatory Visit (INDEPENDENT_AMBULATORY_CARE_PROVIDER_SITE_OTHER): Payer: Medicare HMO

## 2020-04-07 DIAGNOSIS — I428 Other cardiomyopathies: Secondary | ICD-10-CM

## 2020-04-07 NOTE — Progress Notes (Signed)
HPI: FU dilated cardiomyopathy. Patient has a history of left bundle branch block. She underwent cardiac catheterization in [facility] in March of 2010 for reduced LV function. Ejection fraction was 35%. The left main was normal. The LAD had a proximal 25-30% lesion. The circumflex was normal. The right coronary artery had a 15% lesion. Patient was treated medically. Echocardiogram in August 2015 showed an ejection fraction of 25-30% and restrictive filling. Had biventricular ICD October 2015.Carotid Dopplers August 2019 showed less than 50% bilaterally. Echocardiogram January 2021 showed ejection fraction 45 to 50%, mild mitral regurgitation, mild tricuspid regurgitation, mild aortic insufficiency.  Since she was last seen, she denies dyspnea, chest pain, palpitations or syncope.  Current Outpatient Medications  Medication Sig Dispense Refill  . albuterol (VENTOLIN HFA) 108 (90 Base) MCG/ACT inhaler Inhale 2 puffs into the lungs every 4 (four) hours as needed for wheezing or shortness of breath.     . bisoprolol (ZEBETA) 5 MG tablet TAKE 1/2 TABLET (2.5 MG TOTAL) BY MOUTH DAILY. Please keep upcoming appt for future refills. 45 tablet 1  . Cholecalciferol (VITAMIN D) 400 UNITS capsule Take 400 Units by mouth daily.    . meclizine (ANTIVERT) 12.5 MG tablet Take 1 tablet (12.5 mg total) by mouth 3 (three) times daily as needed for dizziness. 90 tablet 3  . Multiple Vitamin (MULTIVITAMIN) capsule Take 1 capsule by mouth at bedtime.     Marland Kitchen spironolactone (ALDACTONE) 25 MG tablet Take 0.5 tablets (12.5 mg total) by mouth daily. Please keep upcoming appt for future refills 45 tablet 1  . vitamin C (ASCORBIC ACID) 500 MG tablet Take 500 mg by mouth daily.    Marland Kitchen VITAMIN E PO Take by mouth as directed.     No current facility-administered medications for this visit.     Past Medical History:  Diagnosis Date  . AICD (automatic cardioverter/defibrillator) present   . Allergic state 05/17/2016   Dr  Ernst Bowler asthma and allergy specialist.  . Allergy   . Arthritis    left knee pain, MRI in 2008-tricompartmental  degenerate changes, mucoid degeneration of ACL, post horn meniscal tear and ant horn meniscal tear  . Asthma   . Breast cancer in female [facility]) 2000   left; S/P lumpectomy, radiation and chemotherapy  . CHF (congestive heart failure) ([place])   . Colon polyp    unclear pathology  . Hyperlipidemia   . Hypertension   . LBBB (left bundle branch block)   . Low back pain    left L3-4 injected  . Lymphedema of left arm   . Nonischemic cardiomyopathy ([place])   . Presence of permanent cardiac pacemaker   . Preventative health care 05/18/2016  . Travel sickness 05/12/2015  . Unspecified constipation 04/22/2012  . Vertigo     Past Surgical History:  Procedure Laterality Date  . APPENDECTOMY  2009   Dr. Lucia Gaskins  . BI-VENTRICULAR IMPLANTABLE CARDIOVERTER DEFIBRILLATOR N/A 11/16/2013   Procedure: BI-VENTRICULAR IMPLANTABLE CARDIOVERTER DEFIBRILLATOR  (CRT-D);  Surgeon: Evans Lance, MD;  Location: [facility] CATH LAB;  Service: Cardiovascular;  Laterality: N/A;  . BI-VENTRICULAR IMPLANTABLE CARDIOVERTER DEFIBRILLATOR  (CRT-D)  11/16/13   MDT CRTD implanted by Dr Lovena Le for NICM, CHF, and LBBB  . BREAST BIOPSY Left 2000  . BREAST LUMPECTOMY Left 2000  . CARDIAC CATHETERIZATION  ~ 2010   "no blockage"  . CARPAL TUNNEL RELEASE Bilateral 2007   Dr. Daylene Katayama  . COLONOSCOPY    . KNEE ARTHROSCOPY Right   .  POLYPECTOMY    . TONSILLECTOMY    . TRIGGER FINGER RELEASE Bilateral 2005/05/07  . VAGINAL HYSTERECTOMY     Paritial    Social History   Socioeconomic History  . Marital status: Widowed    Spouse name: Not on file  . Number of children: 2  . Years of education: 45  . Highest education level: Not on file  Occupational History    Employer: DISABLED  Tobacco Use  . Smoking status: Former Smoker    Packs/day: 0.10    Years: 2.00    Pack years: 0.20    Types: Cigarettes    Quit date:  01/18/1970    Years since quitting: 50.2  . Smokeless tobacco: Never Used  . Tobacco comment: "smoked maybe 1 pack/month when I did smoke  Vaping Use  . Vaping Use: Never used  Substance and Sexual Activity  . Alcohol use: No  . Drug use: No  . Sexual activity: Never    Birth control/protection: Surgical, Post-menopausal  Other Topics Concern  . Not on file  Social History Narrative   Widow - husband died in 08-May-2003   Occupation - retired from working in Radiation protection practitioner   2 daughter - on lives in [place], other in [place]   Remote history of tobacco but none in 40 years   Caffeine- coffee, 1 cup daily; 1 energy drink daily (V8)   Social Determinants of Health   Financial Resource Strain: Low Risk   . Difficulty of Paying Living Expenses: Not hard at all  Food Insecurity: No Food Insecurity  . Worried About Charity fundraiser in the Last Year: Never true  . Ran Out of Food in the Last Year: Never true  Transportation Needs: No Transportation Needs  . Lack of Transportation (Medical): No  . Lack of Transportation (Non-Medical): No  Physical Activity: Not on file  Stress: Not on file  Social Connections: Not on file  Intimate Partner Violence: Not on file    Family History  Problem Relation Age of Onset  . Heart attack Mother 54  . Diabetes Sister   . Diabetes Brother   . Coronary artery disease Other        Female first degree relative <60  . Hyperlipidemia Other   . Hypertension Other   . Cancer Other        prostate, 1st degree relative <50  . Coronary artery disease Maternal Grandmother   . Colon cancer Maternal Aunt   . Cancer Daughter   . Allergic rhinitis Neg Hx   . Angioedema Neg Hx   . Asthma Neg Hx   . Eczema Neg Hx   . Immunodeficiency Neg Hx   . Urticaria Neg Hx   . Stomach cancer Neg Hx   . Rectal cancer Neg Hx   . Esophageal cancer Neg Hx   . Liver cancer Neg Hx   . Colon polyps Neg Hx     ROS: no fevers or chills, productive cough, hemoptysis,  dysphasia, odynophagia, melena, hematochezia, dysuria, hematuria, rash, seizure activity, orthopnea, PND, pedal edema, claudication. Remaining systems are negative.  Physical Exam: Well-developed well-nourished in no acute distress.  Skin is warm and dry.  HEENT is normal.  Neck is supple.  Chest is clear to auscultation with normal expansion.  Cardiovascular exam is regular rate and rhythm.  Abdominal exam nontender or distended. No masses palpated. Extremities show no edema. neuro grossly intact  A/P  1 cardiomyopathy-mildly reduced LV function on most recent echocardiogram.  Previously did not tolerate ARB or ACE inhibitor and is not interested in hydralazine/nitrates.  We will continue beta-blocker at present dose.  2 hypertension-blood pressure controlled.  Continue present medications.  She does not wish to be treated with other medications.  3 hyperlipidemia-intolerant to statins and declines other lipid-lowering medications.  4 ICD-Per EP.  5 chronic diastolic congestive heart failure-continue diuretic at present dose.  Check potassium and renal function.  6 carotid artery disease-mild on most recent Dopplers.  Kirk Ruths, MD

## 2020-04-08 LAB — CUP PACEART REMOTE DEVICE CHECK
Battery Remaining Longevity: 3 mo
Battery Voltage: 2.77 V
Brady Statistic AP VP Percent: 0.62 %
Brady Statistic AP VS Percent: 0.02 %
Brady Statistic AS VP Percent: 95.51 %
Brady Statistic AS VS Percent: 3.85 %
Brady Statistic RA Percent Paced: 0.64 %
Brady Statistic RV Percent Paced: 92.43 %
Date Time Interrogation Session: 20220320234226
HighPow Impedance: 74 Ohm
Implantable Lead Implant Date: 20151030
Implantable Lead Implant Date: 20151030
Implantable Lead Implant Date: 20151030
Implantable Lead Location: 753858
Implantable Lead Location: 753859
Implantable Lead Location: 753860
Implantable Lead Model: 4598
Implantable Lead Model: 5076
Implantable Lead Model: 6935
Implantable Pulse Generator Implant Date: 20151030
Lead Channel Impedance Value: 1026 Ohm
Lead Channel Impedance Value: 1140 Ohm
Lead Channel Impedance Value: 1235 Ohm
Lead Channel Impedance Value: 1368 Ohm
Lead Channel Impedance Value: 1425 Ohm
Lead Channel Impedance Value: 323 Ohm
Lead Channel Impedance Value: 380 Ohm
Lead Channel Impedance Value: 456 Ohm
Lead Channel Impedance Value: 456 Ohm
Lead Channel Impedance Value: 646 Ohm
Lead Channel Impedance Value: 817 Ohm
Lead Channel Impedance Value: 855 Ohm
Lead Channel Impedance Value: 969 Ohm
Lead Channel Pacing Threshold Amplitude: 0.625 V
Lead Channel Pacing Threshold Amplitude: 0.875 V
Lead Channel Pacing Threshold Amplitude: 1.625 V
Lead Channel Pacing Threshold Pulse Width: 0.4 ms
Lead Channel Pacing Threshold Pulse Width: 0.4 ms
Lead Channel Pacing Threshold Pulse Width: 1 ms
Lead Channel Sensing Intrinsic Amplitude: 13.5 mV
Lead Channel Sensing Intrinsic Amplitude: 13.5 mV
Lead Channel Sensing Intrinsic Amplitude: 3.625 mV
Lead Channel Sensing Intrinsic Amplitude: 3.625 mV
Lead Channel Setting Pacing Amplitude: 2 V
Lead Channel Setting Pacing Amplitude: 2 V
Lead Channel Setting Pacing Amplitude: 2.5 V
Lead Channel Setting Pacing Pulse Width: 0.4 ms
Lead Channel Setting Pacing Pulse Width: 1 ms
Lead Channel Setting Sensing Sensitivity: 0.3 mV

## 2020-04-09 ENCOUNTER — Encounter: Payer: Self-pay | Admitting: Cardiology

## 2020-04-09 ENCOUNTER — Other Ambulatory Visit: Payer: Self-pay

## 2020-04-09 ENCOUNTER — Ambulatory Visit: Payer: Medicare HMO | Admitting: Cardiology

## 2020-04-09 VITALS — BP 130/58 | HR 76 | Ht 65.0 in | Wt 217.0 lb

## 2020-04-09 DIAGNOSIS — I5042 Chronic combined systolic (congestive) and diastolic (congestive) heart failure: Secondary | ICD-10-CM | POA: Diagnosis not present

## 2020-04-09 DIAGNOSIS — I10 Essential (primary) hypertension: Secondary | ICD-10-CM | POA: Diagnosis not present

## 2020-04-09 DIAGNOSIS — I2510 Atherosclerotic heart disease of native coronary artery without angina pectoris: Secondary | ICD-10-CM | POA: Diagnosis not present

## 2020-04-09 DIAGNOSIS — I428 Other cardiomyopathies: Secondary | ICD-10-CM

## 2020-04-09 NOTE — Patient Instructions (Signed)

## 2020-04-10 LAB — BASIC METABOLIC PANEL
BUN/Creatinine Ratio: 11 — ABNORMAL LOW (ref 12–28)
BUN: 8 mg/dL (ref 8–27)
CO2: 20 mmol/L (ref 20–29)
Calcium: 10.2 mg/dL (ref 8.7–10.3)
Chloride: 105 mmol/L (ref 96–106)
Creatinine, Ser: 0.75 mg/dL (ref 0.57–1.00)
Glucose: 81 mg/dL (ref 65–99)
Potassium: 4.5 mmol/L (ref 3.5–5.2)
Sodium: 143 mmol/L (ref 134–144)
eGFR: 83 mL/min/{1.73_m2} (ref >59)

## 2020-04-14 NOTE — Progress Notes (Signed)
Remote ICD transmission.   

## 2020-04-14 NOTE — Addendum Note (Signed)
Addended by: Cheri Kearns A on: 04/14/2020 12:07 PM   Modules accepted: Level of Service

## 2020-05-08 ENCOUNTER — Ambulatory Visit (INDEPENDENT_AMBULATORY_CARE_PROVIDER_SITE_OTHER): Payer: Medicare HMO

## 2020-05-08 DIAGNOSIS — I428 Other cardiomyopathies: Secondary | ICD-10-CM

## 2020-05-08 LAB — CUP PACEART REMOTE DEVICE CHECK
Battery Remaining Longevity: 2 mo
Battery Voltage: 2.76 V
Brady Statistic AP VP Percent: 0.78 %
Brady Statistic AP VS Percent: 0.02 %
Brady Statistic AS VP Percent: 95.23 %
Brady Statistic AS VS Percent: 3.96 %
Brady Statistic RA Percent Paced: 0.8 %
Brady Statistic RV Percent Paced: 91.29 %
Date Time Interrogation Session: 20220421033325
HighPow Impedance: 71 Ohm
Implantable Lead Implant Date: 20151030
Implantable Lead Implant Date: 20151030
Implantable Lead Implant Date: 20151030
Implantable Lead Location: 753858
Implantable Lead Location: 753859
Implantable Lead Location: 753860
Implantable Lead Model: 4598
Implantable Lead Model: 5076
Implantable Lead Model: 6935
Implantable Pulse Generator Implant Date: 20151030
Lead Channel Impedance Value: 1045 Ohm
Lead Channel Impedance Value: 1159 Ohm
Lead Channel Impedance Value: 1311 Ohm
Lead Channel Impedance Value: 1368 Ohm
Lead Channel Impedance Value: 304 Ohm
Lead Channel Impedance Value: 399 Ohm
Lead Channel Impedance Value: 399 Ohm
Lead Channel Impedance Value: 456 Ohm
Lead Channel Impedance Value: 627 Ohm
Lead Channel Impedance Value: 760 Ohm
Lead Channel Impedance Value: 855 Ohm
Lead Channel Impedance Value: 912 Ohm
Lead Channel Impedance Value: 950 Ohm
Lead Channel Pacing Threshold Amplitude: 0.625 V
Lead Channel Pacing Threshold Amplitude: 0.875 V
Lead Channel Pacing Threshold Amplitude: 1.5 V
Lead Channel Pacing Threshold Pulse Width: 0.4 ms
Lead Channel Pacing Threshold Pulse Width: 0.4 ms
Lead Channel Pacing Threshold Pulse Width: 1 ms
Lead Channel Sensing Intrinsic Amplitude: 15.625 mV
Lead Channel Sensing Intrinsic Amplitude: 15.625 mV
Lead Channel Sensing Intrinsic Amplitude: 3.875 mV
Lead Channel Sensing Intrinsic Amplitude: 3.875 mV
Lead Channel Setting Pacing Amplitude: 2 V
Lead Channel Setting Pacing Amplitude: 2 V
Lead Channel Setting Pacing Amplitude: 2.5 V
Lead Channel Setting Pacing Pulse Width: 0.4 ms
Lead Channel Setting Pacing Pulse Width: 1 ms
Lead Channel Setting Sensing Sensitivity: 0.3 mV

## 2020-05-22 ENCOUNTER — Ambulatory Visit: Payer: Medicare HMO | Attending: Internal Medicine

## 2020-05-22 DIAGNOSIS — Z23 Encounter for immunization: Secondary | ICD-10-CM

## 2020-05-22 NOTE — Progress Notes (Signed)
   Covid-19 Vaccination Clinic  Name:  LIVANA YERIAN    MRN: [id number] DOB: 31-May-1945  05/22/2020  Ms. Delisi was observed post Covid-19 immunization for 15 minutes without incident. She was provided with Vaccine Information Sheet and instruction to access the V-Safe system.   Ms. Marschner was instructed to call 911 with any severe reactions post vaccine: Marland Kitchen Difficulty breathing  . Swelling of face and throat  . A fast heartbeat  . A bad rash all over body  . Dizziness and weakness   Immunizations Administered    Name Date Dose VIS Date Route   PFIZER Comrnaty(Gray TOP) Covid-19 Vaccine 05/22/2020 11:19 AM 0.3 mL 12/27/2019 Intramuscular   Manufacturer: [employer], [employer]   Lot: [id number]   NDC: [id number]

## 2020-05-26 NOTE — Progress Notes (Signed)
Remote ICD transmission.   

## 2020-05-27 ENCOUNTER — Other Ambulatory Visit (HOSPITAL_BASED_OUTPATIENT_CLINIC_OR_DEPARTMENT_OTHER): Payer: Self-pay

## 2020-05-27 MED ORDER — PFIZER-BIONT COVID-19 VAC-TRIS 30 MCG/0.3ML IM SUSP
INTRAMUSCULAR | 0 refills | Status: DC
Start: 2020-05-22 — End: 2020-08-25
  Filled 2020-05-27: qty 0.3, 1d supply, fill #0

## 2020-06-02 ENCOUNTER — Ambulatory Visit: Payer: Medicare HMO

## 2020-06-03 ENCOUNTER — Telehealth: Payer: Self-pay | Admitting: Emergency Medicine

## 2020-06-03 LAB — CUP PACEART REMOTE DEVICE CHECK
Battery Remaining Longevity: 1 mo
Battery Voltage: 2.72 V
Brady Statistic AP VP Percent: 1.21 %
Brady Statistic AP VS Percent: 0.02 %
Brady Statistic AS VP Percent: 94.5 %
Brady Statistic AS VS Percent: 4.27 %
Brady Statistic RA Percent Paced: 1.22 %
Brady Statistic RV Percent Paced: 89.62 %
Date Time Interrogation Session: 20220516073827
HighPow Impedance: 70 Ohm
Implantable Lead Implant Date: 20151030
Implantable Lead Implant Date: 20151030
Implantable Lead Implant Date: 20151030
Implantable Lead Location: 753858
Implantable Lead Location: 753859
Implantable Lead Location: 753860
Implantable Lead Model: 4598
Implantable Lead Model: 5076
Implantable Lead Model: 6935
Implantable Pulse Generator Implant Date: 20151030
Lead Channel Impedance Value: 1026 Ohm
Lead Channel Impedance Value: 1159 Ohm
Lead Channel Impedance Value: 1273 Ohm
Lead Channel Impedance Value: 1311 Ohm
Lead Channel Impedance Value: 323 Ohm
Lead Channel Impedance Value: 399 Ohm
Lead Channel Impedance Value: 437 Ohm
Lead Channel Impedance Value: 456 Ohm
Lead Channel Impedance Value: 589 Ohm
Lead Channel Impedance Value: 665 Ohm
Lead Channel Impedance Value: 817 Ohm
Lead Channel Impedance Value: 855 Ohm
Lead Channel Impedance Value: 893 Ohm
Lead Channel Pacing Threshold Amplitude: 0.75 V
Lead Channel Pacing Threshold Amplitude: 0.875 V
Lead Channel Pacing Threshold Amplitude: 1.375 V
Lead Channel Pacing Threshold Pulse Width: 0.4 ms
Lead Channel Pacing Threshold Pulse Width: 0.4 ms
Lead Channel Pacing Threshold Pulse Width: 1 ms
Lead Channel Sensing Intrinsic Amplitude: 11.375 mV
Lead Channel Sensing Intrinsic Amplitude: 11.375 mV
Lead Channel Sensing Intrinsic Amplitude: 3.375 mV
Lead Channel Sensing Intrinsic Amplitude: 3.375 mV
Lead Channel Setting Pacing Amplitude: 2 V
Lead Channel Setting Pacing Amplitude: 2 V
Lead Channel Setting Pacing Amplitude: 2.5 V
Lead Channel Setting Pacing Pulse Width: 0.4 ms
Lead Channel Setting Pacing Pulse Width: 1 ms
Lead Channel Setting Sensing Sensitivity: 0.3 mV

## 2020-06-03 NOTE — Telephone Encounter (Signed)
LMOM to call device clinic.  ST Jude CRT-D @ ERI.

## 2020-06-04 NOTE — Telephone Encounter (Signed)
The patient left two voice mails returning the nurse phone call.

## 2020-06-04 NOTE — Telephone Encounter (Signed)
Patient notified that her device has reached RRT. She will expect a call from the scheduler to be placed on Dr taylor's schedule to discuss gen change.

## 2020-06-05 ENCOUNTER — Telehealth: Payer: Self-pay

## 2020-06-05 NOTE — Telephone Encounter (Signed)
Patient called to say that she doesn't like to use the mychart it confuses her. Please advise

## 2020-06-09 NOTE — Telephone Encounter (Signed)
Call placed to Pt.  Pt scheduled for gen change on June 19, 2020  Pt has appt tomorrow- will get lab work, Materials engineer and soap  Work up complete

## 2020-06-10 ENCOUNTER — Ambulatory Visit: Payer: Medicare HMO | Admitting: Nurse Practitioner

## 2020-06-10 ENCOUNTER — Other Ambulatory Visit: Payer: Self-pay

## 2020-06-10 ENCOUNTER — Encounter: Payer: Self-pay | Admitting: Nurse Practitioner

## 2020-06-10 VITALS — BP 132/70 | HR 86 | Ht 65.0 in | Wt 220.0 lb

## 2020-06-10 DIAGNOSIS — I428 Other cardiomyopathies: Secondary | ICD-10-CM

## 2020-06-10 DIAGNOSIS — I10 Essential (primary) hypertension: Secondary | ICD-10-CM

## 2020-06-10 LAB — BASIC METABOLIC PANEL
BUN/Creatinine Ratio: 11 — ABNORMAL LOW (ref 12–28)
BUN: 10 mg/dL (ref 8–27)
CO2: 23 mmol/L (ref 20–29)
Calcium: 10.1 mg/dL (ref 8.7–10.3)
Chloride: 103 mmol/L (ref 96–106)
Creatinine, Ser: 0.93 mg/dL (ref 0.57–1.00)
Glucose: 84 mg/dL (ref 65–99)
Potassium: 4.4 mmol/L (ref 3.5–5.2)
Sodium: 140 mmol/L (ref 134–144)
eGFR: 64 mL/min/{1.73_m2} (ref >59)

## 2020-06-10 LAB — CBC
Hematocrit: 38.4 % (ref 34.0–46.6)
Hemoglobin: 12.9 g/dL (ref 11.1–15.9)
MCH: 31.2 pg (ref 26.6–33.0)
MCHC: 33.6 g/dL (ref 31.5–35.7)
MCV: 93 fL (ref 79–97)
Platelets: 197 10*3/uL (ref 150–450)
RBC: 4.13 x10E6/uL (ref 3.77–5.28)
RDW: 13.5 % (ref 11.7–15.4)
WBC: 7 10*3/uL (ref 3.4–10.8)

## 2020-06-10 NOTE — Progress Notes (Signed)
Electrophysiology Office Note Date: 06/10/2020  ID:  Brenda Marsh, Brenda Marsh 1945/09/11, MRN [id number]  PCP: Mosie Lukes, MD Primary Cardiologist: Stanford Breed Electrophysiologist: Lovena Le  CC: Routine ICD follow-up  Brenda Marsh is a 75 y.o. female seen today for Dr Lovena Le.  She presents today for routine electrophysiology followup.  Since last being seen in our clinic, the patient reports doing very well. She denies chest pain, palpitations, dyspnea, PND, orthopnea, nausea, vomiting, dizziness, syncope, edema, weight gain, or early satiety.  She has not had ICD shocks.   Device History: MDT CRTD implanted 2015 for cardiomyopathy History of appropriate therapy: No History of AAD therapy: No   Past Medical History:  Diagnosis Date  . AICD (automatic cardioverter/defibrillator) present   . Allergic state 05/17/2016   Dr Ernst Bowler asthma and allergy specialist.  . Allergy   . Arthritis    left knee pain, MRI in 2008-tricompartmental  degenerate changes, mucoid degeneration of ACL, post horn meniscal tear and ant horn meniscal tear  . Asthma   . Breast cancer in female [facility]) 2000   left; S/P lumpectomy, radiation and chemotherapy  . CHF (congestive heart failure) ([place])   . Colon polyp    unclear pathology  . Hyperlipidemia   . Hypertension   . LBBB (left bundle branch block)   . Low back pain    left L3-4 injected  . Lymphedema of left arm   . Nonischemic cardiomyopathy ([place])   . Presence of permanent cardiac pacemaker   . Preventative health care 05/18/2016  . Travel sickness 05/12/2015  . Unspecified constipation 04/22/2012  . Vertigo    Past Surgical History:  Procedure Laterality Date  . APPENDECTOMY  2009   Dr. Lucia Gaskins  . BI-VENTRICULAR IMPLANTABLE CARDIOVERTER DEFIBRILLATOR N/A 11/16/2013   Procedure: BI-VENTRICULAR IMPLANTABLE CARDIOVERTER DEFIBRILLATOR  (CRT-D);  Surgeon: Evans Lance, MD;  Location: [facility] CATH LAB;  Service: Cardiovascular;  Laterality: N/A;   . BI-VENTRICULAR IMPLANTABLE CARDIOVERTER DEFIBRILLATOR  (CRT-D)  11/16/13   MDT CRTD implanted by Dr Lovena Le for NICM, CHF, and LBBB  . BREAST BIOPSY Left 2000  . BREAST LUMPECTOMY Left 2000  . CARDIAC CATHETERIZATION  ~ 2010   "no blockage"  . CARPAL TUNNEL RELEASE Bilateral 2007   Dr. Daylene Katayama  . COLONOSCOPY    . KNEE ARTHROSCOPY Right   . POLYPECTOMY    . TONSILLECTOMY    . TRIGGER FINGER RELEASE Bilateral 2007  . VAGINAL HYSTERECTOMY     Paritial    Current Outpatient Medications  Medication Sig Dispense Refill  . albuterol (VENTOLIN HFA) 108 (90 Base) MCG/ACT inhaler Inhale 2 puffs into the lungs every 4 (four) hours as needed for wheezing or shortness of breath.     . bisoprolol (ZEBETA) 5 MG tablet TAKE 1/2 TABLET (2.5 MG TOTAL) BY MOUTH DAILY. Please keep upcoming appt for future refills. 45 tablet 1  . Cholecalciferol (VITAMIN D) 400 UNITS capsule Take 400 Units by mouth daily.    Marland Kitchen COVID-19 mRNA Vac-TriS, Pfizer, (PFIZER-BIONT COVID-19 VAC-TRIS) SUSP injection Inject into the muscle. 0.3 mL 0  . meclizine (ANTIVERT) 12.5 MG tablet Take 1 tablet (12.5 mg total) by mouth 3 (three) times daily as needed for dizziness. 90 tablet 3  . Multiple Vitamin (MULTIVITAMIN) capsule Take 1 capsule by mouth at bedtime.     Marland Kitchen spironolactone (ALDACTONE) 25 MG tablet Take 0.5 tablets (12.5 mg total) by mouth daily. Please keep upcoming appt for future refills 45 tablet 1  . vitamin C (  ASCORBIC ACID) 500 MG tablet Take 500 mg by mouth daily.    Marland Kitchen VITAMIN E PO Take by mouth as directed.     No current facility-administered medications for this visit.    Allergies:   Contrast media [iodinated diagnostic agents], Crestor [rosuvastatin], Metrizamide, Atorvastatin, Lisinopril, Naproxen sodium, Naproxen sodium, Penicillins, Shellfish allergy, and Simvastatin   Social History: Social History   Socioeconomic History  . Marital status: Widowed    Spouse name: Not on file  . Number of children:  2  . Years of education: 71  . Highest education level: Not on file  Occupational History    Employer: DISABLED  Tobacco Use  . Smoking status: Former Smoker    Packs/day: 0.10    Years: 2.00    Pack years: 0.20    Types: Cigarettes    Quit date: 01/18/1970    Years since quitting: 50.4  . Smokeless tobacco: Never Used  . Tobacco comment: "smoked maybe 1 pack/month when I did smoke  Vaping Use  . Vaping Use: Never used  Substance and Sexual Activity  . Alcohol use: No  . Drug use: No  . Sexual activity: Never    Birth control/protection: Surgical, Post-menopausal  Other Topics Concern  . Not on file  Social History Narrative   Widow - husband died in Apr 21, 2003   Occupation - retired from working in Radiation protection practitioner   2 daughter - on lives in [place], other in [place]   Remote history of tobacco but none in 40 years   Caffeine- coffee, 1 cup daily; 1 energy drink daily (V8)   Social Determinants of Health   Financial Resource Strain: Low Risk   . Difficulty of Paying Living Expenses: Not hard at all  Food Insecurity: No Food Insecurity  . Worried About Charity fundraiser in the Last Year: Never true  . Ran Out of Food in the Last Year: Never true  Transportation Needs: No Transportation Needs  . Lack of Transportation (Medical): No  . Lack of Transportation (Non-Medical): No  Physical Activity: Not on file  Stress: Not on file  Social Connections: Not on file  Intimate Partner Violence: Not on file    Family History: Family History  Problem Relation Age of Onset  . Heart attack Mother 51  . Diabetes Sister   . Diabetes Brother   . Coronary artery disease Other        Female first degree relative <60  . Hyperlipidemia Other   . Hypertension Other   . Cancer Other        prostate, 1st degree relative <50  . Coronary artery disease Maternal Grandmother   . Colon cancer Maternal Aunt   . Cancer Daughter   . Allergic rhinitis Neg Hx   . Angioedema Neg Hx   . Asthma Neg  Hx   . Eczema Neg Hx   . Immunodeficiency Neg Hx   . Urticaria Neg Hx   . Stomach cancer Neg Hx   . Rectal cancer Neg Hx   . Esophageal cancer Neg Hx   . Liver cancer Neg Hx   . Colon polyps Neg Hx     Review of Systems: All other systems reviewed and are otherwise negative except as noted above.   Physical Exam: VS:  There were no vitals taken for this visit. , BMI There is no height or weight on file to calculate BMI.  GEN- The patient is well appearing, alert and oriented x 3 today.  HEENT: normocephalic, atraumatic; sclera clear, conjunctiva pink; hearing intact; oropharynx clear; neck supple  Lungs- Clear to ausculation bilaterally, normal work of breathing.  No wheezes, rales, rhonchi Heart- Regular rate and rhythm (paced) GI- soft, non-tender, non-distended, bowel sounds present  Extremities- no clubbing, cyanosis, or edema  MS- no significant deformity or atrophy Skin- warm and dry, no rash or lesion; ICD pocket well healed Psych- euthymic mood, full affect Neuro- strength and sensation are intact  ICD interrogation- reviewed in detail today,  See PACEART report  EKG:  EKG is ordered today. The ekg ordered today shows sinus rhythm with CRT pacing   Recent Labs: 10/31/2019: ALT 16; Hemoglobin 12.5; Platelet Count 190 11/22/2019: TSH 0.79 04/09/2020: BUN 8; Creatinine, Ser 0.75; Potassium 4.5; Sodium 143   Wt Readings from Last 3 Encounters:  04/09/20 217 lb (98.4 kg)  04/01/20 217 lb (98.4 kg)  10/31/19 218 lb (98.9 kg)     Other studies Reviewed: Additional studies/ records that were reviewed today include: Dr Lovena Le and Dr Jacalyn Lefevre office notes   Assessment and Plan:  1.  Chronic systolic dysfunction euvolemic today Stable on an appropriate medical regimen EF improved with CRT  See Pace Art report No changes today ICD at [facility] today. Risks, benefits to gen change reviewed with patient who wishes to proceed. Will schedule at next available time  2.   HTN Stable No change required today    Current medicines are reviewed at length with the patient today.   The patient does not have concerns regarding her medicines.  The following changes were made today:  none  Labs/ tests ordered today include: pre procedure labs  No orders of the defined types were placed in this encounter.    Disposition:   Follow up with Dr Lovena Le after gen change     Signed, Chanetta Marshall, NP 06/10/2020 7:53 AM  [address] [place] [address] [phone] (office) [phone] (fax)

## 2020-06-10 NOTE — Patient Instructions (Signed)
Medication Instructions:   No medication changes were made today.  Labwork: You will have labs drawn today: CBC and BMP   Testing/Procedures:  *See you procedure instruction letter*  Follow-Up:  Dr. Tanna Furry scheduler will be calling you to schedule a wound/pacer check and a 90 day follow up with Dr. Lovena Le.   Any Other Special Instructions Will Be Listed Below (If Applicable).     If you need a refill on your cardiac medications before your next appointment, please call your pharmacy.

## 2020-06-18 NOTE — Pre-Procedure Instructions (Signed)
Attempted to call patient regarding instructions for procedure for tomorrow.  Left voicemail on the following items: Arrival time 1130 Nothing to eat or drink after midnight No meds AM of procedure Responsible person to drive you home and stay with you for 24 hrs Wash with special soap night before and morning of procedure

## 2020-06-19 ENCOUNTER — Other Ambulatory Visit: Payer: Self-pay

## 2020-06-19 ENCOUNTER — Encounter (HOSPITAL_COMMUNITY)
Admission: RE | Disposition: A | Discharge status: Home / Self Care | Payer: Medicare HMO | Source: Home / Self Care | Attending: Internal Medicine

## 2020-06-19 ENCOUNTER — Ambulatory Visit (HOSPITAL_COMMUNITY)
Admission: RE | Admit: 2020-06-19 | Discharge: 2020-06-19 | Disposition: A | Discharge status: Home / Self Care | Payer: Medicare HMO | Attending: Internal Medicine | Admitting: Internal Medicine

## 2020-06-19 DIAGNOSIS — I5022 Chronic systolic (congestive) heart failure: Secondary | ICD-10-CM | POA: Diagnosis not present

## 2020-06-19 DIAGNOSIS — Z91013 Allergy to seafood: Secondary | ICD-10-CM | POA: Insufficient documentation

## 2020-06-19 DIAGNOSIS — Z888 Allergy status to other drugs, medicaments and biological substances status: Secondary | ICD-10-CM | POA: Insufficient documentation

## 2020-06-19 DIAGNOSIS — Z79899 Other long term (current) drug therapy: Secondary | ICD-10-CM | POA: Diagnosis not present

## 2020-06-19 DIAGNOSIS — Z880 Allergy status to penicillin: Secondary | ICD-10-CM | POA: Insufficient documentation

## 2020-06-19 DIAGNOSIS — Z91041 Radiographic dye allergy status: Secondary | ICD-10-CM | POA: Insufficient documentation

## 2020-06-19 DIAGNOSIS — I428 Other cardiomyopathies: Secondary | ICD-10-CM | POA: Diagnosis not present

## 2020-06-19 DIAGNOSIS — Z87891 Personal history of nicotine dependence: Secondary | ICD-10-CM | POA: Insufficient documentation

## 2020-06-19 DIAGNOSIS — Z4502 Encounter for adjustment and management of automatic implantable cardiac defibrillator: Secondary | ICD-10-CM | POA: Diagnosis not present

## 2020-06-19 DIAGNOSIS — Z886 Allergy status to analgesic agent status: Secondary | ICD-10-CM | POA: Insufficient documentation

## 2020-06-19 DIAGNOSIS — I447 Left bundle-branch block, unspecified: Secondary | ICD-10-CM | POA: Insufficient documentation

## 2020-06-19 DIAGNOSIS — I110 Hypertensive heart disease with heart failure: Secondary | ICD-10-CM | POA: Diagnosis not present

## 2020-06-19 HISTORY — PX: BIV ICD GENERATOR CHANGEOUT: EP1194

## 2020-06-19 SURGERY — BIV ICD GENERATOR CHANGEOUT

## 2020-06-19 MED ORDER — SODIUM CHLORIDE 0.9 % IV SOLN: INTRAVENOUS | Status: DC

## 2020-06-19 MED ORDER — FENTANYL CITRATE (PF) 100 MCG/2ML IJ SOLN
INTRAMUSCULAR | Status: AC
  Filled 2020-06-19: qty 2

## 2020-06-19 MED ORDER — POVIDONE-IODINE 10 % EX SWAB: 2.0000 "application " | Freq: Once | CUTANEOUS | Status: DC

## 2020-06-19 MED ORDER — MIDAZOLAM HCL 5 MG/5ML IJ SOLN
INTRAMUSCULAR | Status: AC
  Filled 2020-06-19: qty 5

## 2020-06-19 MED ORDER — ONDANSETRON HCL 4 MG/2ML IJ SOLN
INTRAMUSCULAR | Status: DC | PRN
  Administered 2020-06-19: 2 mg via INTRAVENOUS

## 2020-06-19 MED ORDER — ONDANSETRON HCL 4 MG/2ML IJ SOLN
INTRAMUSCULAR | Status: AC
  Filled 2020-06-19: qty 2

## 2020-06-19 MED ORDER — CHLORHEXIDINE GLUCONATE 4 % EX LIQD: 4.0000 "application " | Freq: Once | CUTANEOUS | Status: DC

## 2020-06-19 MED ORDER — LIDOCAINE HCL (PF) 1 % IJ SOLN
INTRAMUSCULAR | Status: AC
  Filled 2020-06-19: qty 60

## 2020-06-19 MED ORDER — LIDOCAINE HCL (PF) 1 % IJ SOLN
INTRAMUSCULAR | Status: DC | PRN
  Administered 2020-06-19: 15 mL
  Administered 2020-06-19: 40 mL

## 2020-06-19 MED ORDER — SODIUM CHLORIDE 0.9 % IV SOLN
80.0000 mg | INTRAVENOUS | Status: AC
  Administered 2020-06-19: 80 mg

## 2020-06-19 MED ORDER — VANCOMYCIN HCL 1000 MG/200ML IV SOLN
1000.0000 mg | INTRAVENOUS | Status: AC
  Administered 2020-06-19: 1000 mg via INTRAVENOUS
  Filled 2020-06-19: qty 200

## 2020-06-19 MED ORDER — MIDAZOLAM HCL 5 MG/5ML IJ SOLN
INTRAMUSCULAR | Status: DC | PRN
  Administered 2020-06-19: 2 mg via INTRAVENOUS

## 2020-06-19 MED ORDER — SODIUM CHLORIDE 0.9 % IV SOLN
INTRAVENOUS | Status: AC
  Filled 2020-06-19: qty 2

## 2020-06-19 MED ORDER — FENTANYL CITRATE (PF) 100 MCG/2ML IJ SOLN
INTRAMUSCULAR | Status: DC | PRN
  Administered 2020-06-19: 25 ug via INTRAVENOUS

## 2020-06-19 MED ORDER — ACETAMINOPHEN 325 MG PO TABS
325.0000 mg | ORAL_TABLET | ORAL | Status: DC | PRN
Start: 2020-06-19 — End: 2020-06-19
  Filled 2020-06-19: qty 2

## 2020-06-19 MED ORDER — VANCOMYCIN HCL IN DEXTROSE 1-5 GM/200ML-% IV SOLN
INTRAVENOUS | Status: AC
  Filled 2020-06-19: qty 200

## 2020-06-19 MED ORDER — ONDANSETRON HCL 4 MG/2ML IJ SOLN: 4.0000 mg | Freq: Once | INTRAMUSCULAR | Status: DC

## 2020-06-19 MED ORDER — ONDANSETRON HCL 4 MG/2ML IJ SOLN: 4.0000 mg | Freq: Four times a day (QID) | INTRAMUSCULAR | Status: DC | PRN

## 2020-06-19 SURGICAL SUPPLY — 4 items
CABLE SURGICAL S-101-97-12 (CABLE) ×2 IMPLANT
ICD CLARIA MRI DTMA1Q1 (ICD Generator) ×2 IMPLANT
PAD PRO RADIOLUCENT 2001M-C (PAD) ×2 IMPLANT
TRAY PACEMAKER INSERTION (PACKS) ×2 IMPLANT

## 2020-06-19 NOTE — H&P (Signed)
Electrophysiology Office Note Date: 06/10/2020  ID:  Marsh, Brenda [birth year], MRN [id number]  PCP: Mosie Lukes, MD Primary Cardiologist: Stanford Breed Electrophysiologist: Lovena Le  CC: Routine ICD follow-up  Brenda Marsh is a 75 y.o. female seen today for Dr Lovena Le.  She presents today for routine electrophysiology followup.  Since last being seen in our clinic, the patient reports doing very well. She denies chest pain, palpitations, dyspnea, PND, orthopnea, nausea, vomiting, dizziness, syncope, edema, weight gain, or early satiety.  She has not had ICD shocks.   Device History: MDT CRTD implanted 2015 for cardiomyopathy History of appropriate therapy: No History of AAD therapy: No       Past Medical History:  Diagnosis Date  . AICD (automatic cardioverter/defibrillator) present   . Allergic state 05/17/2016   Dr Ernst Bowler asthma and allergy specialist.  . Allergy   . Arthritis    left knee pain, MRI in 2008-tricompartmental  degenerate changes, mucoid degeneration of ACL, post horn meniscal tear and ant horn meniscal tear  . Asthma   . Breast cancer in female [facility]) 2000   left; S/P lumpectomy, radiation and chemotherapy  . CHF (congestive heart failure) ([place])   . Colon polyp    unclear pathology  . Hyperlipidemia   . Hypertension   . LBBB (left bundle branch block)   . Low back pain    left L3-4 injected  . Lymphedema of left arm   . Nonischemic cardiomyopathy ([place])   . Presence of permanent cardiac pacemaker   . Preventative health care 05/18/2016  . Travel sickness 05/12/2015  . Unspecified constipation 04/22/2012  . Vertigo         Past Surgical History:  Procedure Laterality Date  . APPENDECTOMY  2009   Dr. Lucia Gaskins  . BI-VENTRICULAR IMPLANTABLE CARDIOVERTER DEFIBRILLATOR N/A 11/16/2013   Procedure: BI-VENTRICULAR IMPLANTABLE CARDIOVERTER DEFIBRILLATOR  (CRT-D);  Surgeon: Evans Lance, MD;  Location: [facility] CATH LAB;   Service: Cardiovascular;  Laterality: N/A;  . BI-VENTRICULAR IMPLANTABLE CARDIOVERTER DEFIBRILLATOR  (CRT-D)  11/16/13   MDT CRTD implanted by Dr Lovena Le for NICM, CHF, and LBBB  . BREAST BIOPSY Left 2000  . BREAST LUMPECTOMY Left 2000  . CARDIAC CATHETERIZATION  ~ 2010   "no blockage"  . CARPAL TUNNEL RELEASE Bilateral 2007   Dr. Daylene Katayama  . COLONOSCOPY    . KNEE ARTHROSCOPY Right   . POLYPECTOMY    . TONSILLECTOMY    . TRIGGER FINGER RELEASE Bilateral 2007  . VAGINAL HYSTERECTOMY     Paritial    Current Outpatient Medications  Medication Sig Dispense Refill  . albuterol (VENTOLIN HFA) 108 (90 Base) MCG/ACT inhaler Inhale 2 puffs into the lungs every 4 (four) hours as needed for wheezing or shortness of breath.     . bisoprolol (ZEBETA) 5 MG tablet TAKE 1/2 TABLET (2.5 MG TOTAL) BY MOUTH DAILY. Please keep upcoming appt for future refills. 45 tablet 1  . Cholecalciferol (VITAMIN D) 400 UNITS capsule Take 400 Units by mouth daily.    Marland Kitchen COVID-19 mRNA Vac-TriS, Pfizer, (PFIZER-BIONT COVID-19 VAC-TRIS) SUSP injection Inject into the muscle. 0.3 mL 0  . meclizine (ANTIVERT) 12.5 MG tablet Take 1 tablet (12.5 mg total) by mouth 3 (three) times daily as needed for dizziness. 90 tablet 3  . Multiple Vitamin (MULTIVITAMIN) capsule Take 1 capsule by mouth at bedtime.     Marland Kitchen spironolactone (ALDACTONE) 25 MG tablet Take 0.5 tablets (12.5 mg total) by mouth daily. Please keep upcoming appt  for future refills 45 tablet 1  . vitamin C (ASCORBIC ACID) 500 MG tablet Take 500 mg by mouth daily.    Marland Kitchen VITAMIN E PO Take by mouth as directed.     No current facility-administered medications for this visit.    Allergies:   Contrast media [iodinated diagnostic agents], Crestor [rosuvastatin], Metrizamide, Atorvastatin, Lisinopril, Naproxen sodium, Naproxen sodium, Penicillins, Shellfish allergy, and Simvastatin   Social History: Social History        Socioeconomic History   . Marital status: Widowed    Spouse name: Not on file  . Number of children: 2  . Years of education: 39  . Highest education level: Not on file  Occupational History    Employer: DISABLED  Tobacco Use  . Smoking status: Former Smoker    Packs/day: 0.10    Years: 2.00    Pack years: 0.20    Types: Cigarettes    Quit date: 01/18/1970    Years since quitting: 50.4  . Smokeless tobacco: Never Used  . Tobacco comment: "smoked maybe 1 pack/month when I did smoke  Vaping Use  . Vaping Use: Never used  Substance and Sexual Activity  . Alcohol use: No  . Drug use: No  . Sexual activity: Never    Birth control/protection: Surgical, Post-menopausal  Other Topics Concern  . Not on file  Social History Narrative   Widow - husband died in 04-22-03   Occupation - retired from working in Radiation protection practitioner   2 daughter - on lives in [place], other in [place]   Remote history of tobacco but none in 40 years   Caffeine- coffee, 1 cup daily; 1 energy drink daily (V8)   Social Determinants of Health      Financial Resource Strain: Low Risk   . Difficulty of Paying Living Expenses: Not hard at all  Food Insecurity: No Food Insecurity  . Worried About Charity fundraiser in the Last Year: Never true  . Ran Out of Food in the Last Year: Never true  Transportation Needs: No Transportation Needs  . Lack of Transportation (Medical): No  . Lack of Transportation (Non-Medical): No  Physical Activity: Not on file  Stress: Not on file  Social Connections: Not on file  Intimate Partner Violence: Not on file    Family History:      Family History  Problem Relation Age of Onset  . Heart attack Mother 78  . Diabetes Sister   . Diabetes Brother   . Coronary artery disease Other        Female first degree relative <60  . Hyperlipidemia Other   . Hypertension Other   . Cancer Other        prostate, 1st degree relative <50  . Coronary artery disease Maternal  Grandmother   . Colon cancer Maternal Aunt   . Cancer Daughter   . Allergic rhinitis Neg Hx   . Angioedema Neg Hx   . Asthma Neg Hx   . Eczema Neg Hx   . Immunodeficiency Neg Hx   . Urticaria Neg Hx   . Stomach cancer Neg Hx   . Rectal cancer Neg Hx   . Esophageal cancer Neg Hx   . Liver cancer Neg Hx   . Colon polyps Neg Hx     Review of Systems: All other systems reviewed and are otherwise negative except as noted above.   Physical Exam: VS:  There were no vitals taken for this visit. , BMI There is  no height or weight on file to calculate BMI.  GEN- The patient is well appearing, alert and oriented x 3 today.   HEENT: normocephalic, atraumatic; sclera clear, conjunctiva pink; hearing intact; oropharynx clear; neck supple  Lungs- Clear to ausculation bilaterally, normal work of breathing.  No wheezes, rales, rhonchi Heart- Regular rate and rhythm (paced) GI- soft, non-tender, non-distended, bowel sounds present  Extremities- no clubbing, cyanosis, or edema  MS- no significant deformity or atrophy Skin- warm and dry, no rash or lesion; ICD pocket well healed Psych- euthymic mood, full affect Neuro- strength and sensation are intact  ICD interrogation- reviewed in detail today,  See PACEART report  EKG:  EKG is ordered today. The ekg ordered today shows sinus rhythm with CRT pacing   Recent Labs: 10/31/2019: ALT 16; Hemoglobin 12.5; Platelet Count 190 11/22/2019: TSH 0.79 04/09/2020: BUN 8; Creatinine, Ser 0.75; Potassium 4.5; Sodium 143      Wt Readings from Last 3 Encounters:  04/09/20 217 lb (98.4 kg)  04/01/20 217 lb (98.4 kg)  10/31/19 218 lb (98.9 kg)     Other studies Reviewed: Additional studies/ records that were reviewed today include: Dr Lovena Le and Dr Jacalyn Lefevre office notes   Assessment and Plan:  1.  Chronic systolic dysfunction euvolemic today Stable on an appropriate medical regimen EF improved with CRT  See Pace Art  report No changes today ICD at [facility] today. Risks, benefits to gen change reviewed with patient who wishes to proceed. Will schedule at next available time  2.  HTN Stable No change required today    Current medicines are reviewed at length with the patient today.   The patient does not have concerns regarding her medicines.  The following changes were made today:  none  Labs/ tests ordered today include: pre procedure labs  No orders of the defined types were placed in this encounter.    Disposition:   Follow up with Dr Lovena Le after gen change     Signed, Chanetta Marshall, NP 06/10/2020 7:53 AM  EP Attending  Patient seen and examined. Chart reviewed. The patient has a biv ICD implanted for chronic systolic heart failure and LBBB and has reached ERI. I have discussed the indications/risks/benefits/goals/expectations of ICD gen change out with the patient and she wishes to proceed.  Carleene Overlie Pasquale Matters,MD

## 2020-06-19 NOTE — Discharge Instructions (Signed)

## 2020-06-19 NOTE — Progress Notes (Signed)
Pt ambulated without difficulty or bleeding. AVS reviewed with pt and niece.

## 2020-06-20 ENCOUNTER — Encounter (HOSPITAL_COMMUNITY): Payer: Self-pay | Admitting: Internal Medicine

## 2020-06-20 ENCOUNTER — Telehealth: Payer: Self-pay

## 2020-06-20 NOTE — Telephone Encounter (Signed)
Spoke with pt.  She reports she felt fine after her procedure yesterday and slept good last night.  This morning after eating breakfast at around 9 am she started to feel poorly.  She describes feeling a fluttering in her chest accompanied by a need to take deep breathes.  She also experienced lightheadedness and a feeling like she might pass out.  Overall pt states she "feels terrible"  I asked multiple times if she was having chest pain as previously reported.  She denied any pain, just that she was having this feeling of fluttering in her chest that would start/ stop abruptly and the inability to get enough air.   Pt sent manual transmission from her newly implanted CRT-D.   Nothing in transmission explains patient's symptoms.  She had generator changeout only, the RA/RV and LV leads are all chronic having been implanted in 2015.    Pt confirmed that she took her 2 prescribed medications today- Bisoprolol and Spironolactone.  Her BP at time of call was 152/86 which pt indicates is in her usual range.  She has not had any new medications or changes in medications.    Discussed case with RU, PA.  It is agreed that pt should seek immediate assessment as there is nothing to explain her symptoms.   Advised pt she can try contacting PCP, although she would be better to report to ED.  If she doesn't have someone who can take her, she should contact EMS.

## 2020-06-20 NOTE — Telephone Encounter (Signed)
Patient called in stating that she has been having flutters in her chest all day as well as chest pain and some SOB. She states she has been telling herself to take deep breaths. Patient is trying to send a transmission to see if anything is showing up

## 2020-07-03 ENCOUNTER — Other Ambulatory Visit: Payer: Self-pay

## 2020-07-03 ENCOUNTER — Ambulatory Visit (INDEPENDENT_AMBULATORY_CARE_PROVIDER_SITE_OTHER): Payer: Medicare HMO

## 2020-07-03 DIAGNOSIS — I428 Other cardiomyopathies: Secondary | ICD-10-CM | POA: Diagnosis not present

## 2020-07-03 DIAGNOSIS — I5042 Chronic combined systolic (congestive) and diastolic (congestive) heart failure: Secondary | ICD-10-CM | POA: Diagnosis not present

## 2020-07-03 LAB — CUP PACEART INCLINIC DEVICE CHECK
Battery Remaining Longevity: 90 mo
Battery Voltage: 3.05 V
Brady Statistic AP VP Percent: 0.59 %
Brady Statistic AP VS Percent: 0.02 %
Brady Statistic AS VP Percent: 95.32 %
Brady Statistic AS VS Percent: 4.08 %
Brady Statistic RA Percent Paced: 0.61 %
Brady Statistic RV Percent Paced: 72.82 %
Date Time Interrogation Session: 20220616113858
HighPow Impedance: 61 Ohm
Implantable Lead Implant Date: 20151030
Implantable Lead Implant Date: 20151030
Implantable Lead Implant Date: 20151030
Implantable Lead Location: 753858
Implantable Lead Location: 753859
Implantable Lead Location: 753860
Implantable Lead Model: 4598
Implantable Lead Model: 5076
Implantable Lead Model: 6935
Implantable Pulse Generator Implant Date: 20220602
Lead Channel Impedance Value: 1064 Ohm
Lead Channel Impedance Value: 1235 Ohm
Lead Channel Impedance Value: 1406 Ohm
Lead Channel Impedance Value: 1463 Ohm
Lead Channel Impedance Value: 253.786
Lead Channel Impedance Value: 269.677
Lead Channel Impedance Value: 282.765
Lead Channel Impedance Value: 342 Ohm
Lead Channel Impedance Value: 371.45 Ohm
Lead Channel Impedance Value: 399 Ohm
Lead Channel Impedance Value: 406.512
Lead Channel Impedance Value: 418 Ohm
Lead Channel Impedance Value: 456 Ohm
Lead Channel Impedance Value: 646 Ohm
Lead Channel Impedance Value: 760 Ohm
Lead Channel Impedance Value: 874 Ohm
Lead Channel Impedance Value: 931 Ohm
Lead Channel Impedance Value: 950 Ohm
Lead Channel Pacing Threshold Amplitude: 0.75 V
Lead Channel Pacing Threshold Amplitude: 0.875 V
Lead Channel Pacing Threshold Amplitude: 1.75 V
Lead Channel Pacing Threshold Pulse Width: 0.4 ms
Lead Channel Pacing Threshold Pulse Width: 0.4 ms
Lead Channel Pacing Threshold Pulse Width: 0.8 ms
Lead Channel Sensing Intrinsic Amplitude: 12.875 mV
Lead Channel Sensing Intrinsic Amplitude: 4.75 mV
Lead Channel Setting Pacing Amplitude: 1.75 V
Lead Channel Setting Pacing Amplitude: 2 V
Lead Channel Setting Pacing Amplitude: 2.5 V
Lead Channel Setting Pacing Pulse Width: 0.3 ms
Lead Channel Setting Pacing Pulse Width: 0.8 ms
Lead Channel Setting Sensing Sensitivity: 0.3 mV

## 2020-07-03 NOTE — Progress Notes (Signed)
"  After Your Pacemaker"instructions reviewed and given to patient with verbal understanding.  Wound check appointment. Steri-strips removed. Wound without redness or edema. Incision edges approximated, wound well healed. Normal device function. Thresholds, sensing, and impedances consistent with implant measurements. Device programmed at chronic settings considering leads are mature. Histogram distribution appropriate for patient and level of activity. No mode switches or ventricular arrhythmias noted. Patient educated about wound care, arm mobility and shock plan. ROV in 3 months with implanting physician.

## 2020-08-08 ENCOUNTER — Telehealth: Payer: Self-pay | Admitting: Family Medicine

## 2020-08-08 NOTE — Telephone Encounter (Signed)
Left message for patient to schedule Annual Wellness Visit.  Please schedule with Health Nurse Advisor Augustine Radar. at [facility].

## 2020-08-25 ENCOUNTER — Ambulatory Visit (INDEPENDENT_AMBULATORY_CARE_PROVIDER_SITE_OTHER): Payer: Medicare HMO

## 2020-08-25 ENCOUNTER — Other Ambulatory Visit: Payer: Self-pay

## 2020-08-25 VITALS — BP 122/70 | HR 73 | Temp 97.4°F | Resp 16 | Ht 65.0 in | Wt 218.6 lb

## 2020-08-25 DIAGNOSIS — Z780 Asymptomatic menopausal state: Secondary | ICD-10-CM | POA: Diagnosis not present

## 2020-08-25 DIAGNOSIS — Z0000 Encounter for general adult medical examination without abnormal findings: Secondary | ICD-10-CM

## 2020-08-25 NOTE — Patient Instructions (Signed)
Ms. Hamme , Thank you for taking time to come for your Medicare Wellness Visit. I appreciate your ongoing commitment to your health goals. Please review the following plan we discussed and let me know if I can assist you in the future.   Screening recommendations/referrals: Colonoscopy: Completed 01/14/2017-Due 01/14/2022 Mammogram: Completed 10/29/2019-Due 10/28/2020 Bone Density: Ordered today to be scheduled with next mammogram. Recommended yearly ophthalmology/optometry visit for glaucoma screening and checkup Recommended yearly dental visit for hygiene and checkup  Vaccinations: Influenza vaccine: Declined  Pneumococcal vaccine: Declined Tdap vaccine: Discuss with pharmacy Shingles vaccine: Declined   Covid-19:Up to date  Advanced directives: Please bring a copy for your chart  Conditions/risks identified: See problem list  Next appointment: Follow up in one year for your annual wellness visit 09/02/2021 @ 1:00   Preventive Care 65 Years and Older, Female Preventive care refers to lifestyle choices and visits with your health care provider that can promote health and wellness. What does preventive care include? A yearly physical exam. This is also called an annual well check. Dental exams once or twice a year. Routine eye exams. Ask your health care provider how often you should have your eyes checked. Personal lifestyle choices, including: Daily care of your teeth and gums. Regular physical activity. Eating a healthy diet. Avoiding tobacco and drug use. Limiting alcohol use. Practicing safe sex. Taking low-dose aspirin every day. Taking vitamin and mineral supplements as recommended by your health care provider. What happens during an annual well check? The services and screenings done by your health care provider during your annual well check will depend on your age, overall health, lifestyle risk factors, and family history of disease. Counseling  Your health care  provider may ask you questions about your: Alcohol use. Tobacco use. Drug use. Emotional well-being. Home and relationship well-being. Sexual activity. Eating habits. History of falls. Memory and ability to understand (cognition). Work and work Statistician. Reproductive health. Screening  You may have the following tests or measurements: Height, weight, and BMI. Blood pressure. Lipid and cholesterol levels. These may be checked every 5 years, or more frequently if you are over 30 years old. Skin check. Lung cancer screening. You may have this screening every year starting at age 33 if you have a 30-pack-year history of smoking and currently smoke or have quit within the past 15 years. Fecal occult blood test (FOBT) of the stool. You may have this test every year starting at age 35. Flexible sigmoidoscopy or colonoscopy. You may have a sigmoidoscopy every 5 years or a colonoscopy every 10 years starting at age 45. Hepatitis C blood test. Hepatitis B blood test. Sexually transmitted disease (STD) testing. Diabetes screening. This is done by checking your blood sugar (glucose) after you have not eaten for a while (fasting). You may have this done every 1-3 years. Bone density scan. This is done to screen for osteoporosis. You may have this done starting at age 5. Mammogram. This may be done every 1-2 years. Talk to your health care provider about how often you should have regular mammograms. Talk with your health care provider about your test results, treatment options, and if necessary, the need for more tests. Vaccines  Your health care provider may recommend certain vaccines, such as: Influenza vaccine. This is recommended every year. Tetanus, diphtheria, and acellular pertussis (Tdap, Td) vaccine. You may need a Td booster every 10 years. Zoster vaccine. You may need this after age 44. Pneumococcal 13-valent conjugate (PCV13) vaccine. One dose is recommended  after age  61. Pneumococcal polysaccharide (PPSV23) vaccine. One dose is recommended after age 62. Talk to your health care provider about which screenings and vaccines you need and how often you need them. This information is not intended to replace advice given to you by your health care provider. Make sure you discuss any questions you have with your health care provider. Document Released: 01/31/2015 Document Revised: 09/24/2015 Document Reviewed: 11/05/2014 Elsevier Interactive Patient Education  2017 [place] Prevention in the Home Falls can cause injuries. They can happen to people of all ages. There are many things you can do to make your home safe and to help prevent falls. What can I do on the outside of my home? Regularly fix the edges of walkways and driveways and fix any cracks. Remove anything that might make you trip as you walk through a door, such as a raised step or threshold. Trim any bushes or trees on the path to your home. Use bright outdoor lighting. Clear any walking paths of anything that might make someone trip, such as rocks or tools. Regularly check to see if handrails are loose or broken. Make sure that both sides of any steps have handrails. Any raised decks and porches should have guardrails on the edges. Have any leaves, snow, or ice cleared regularly. Use sand or salt on walking paths during winter. Clean up any spills in your garage right away. This includes oil or grease spills. What can I do in the bathroom? Use night lights. Install grab bars by the toilet and in the tub and shower. Do not use towel bars as grab bars. Use non-skid mats or decals in the tub or shower. If you need to sit down in the shower, use a plastic, non-slip stool. Keep the floor dry. Clean up any water that spills on the floor as soon as it happens. Remove soap buildup in the tub or shower regularly. Attach bath mats securely with double-sided non-slip rug tape. Do not have throw  rugs and other things on the floor that can make you trip. What can I do in the bedroom? Use night lights. Make sure that you have a light by your bed that is easy to reach. Do not use any sheets or blankets that are too big for your bed. They should not hang down onto the floor. Have a firm chair that has side arms. You can use this for support while you get dressed. Do not have throw rugs and other things on the floor that can make you trip. What can I do in the kitchen? Clean up any spills right away. Avoid walking on wet floors. Keep items that you use a lot in easy-to-reach places. If you need to reach something above you, use a strong step stool that has a grab bar. Keep electrical cords out of the way. Do not use floor polish or wax that makes floors slippery. If you must use wax, use non-skid floor wax. Do not have throw rugs and other things on the floor that can make you trip. What can I do with my stairs? Do not leave any items on the stairs. Make sure that there are handrails on both sides of the stairs and use them. Fix handrails that are broken or loose. Make sure that handrails are as long as the stairways. Check any carpeting to make sure that it is firmly attached to the stairs. Fix any carpet that is loose or worn. Avoid having throw  rugs at the top or bottom of the stairs. If you do have throw rugs, attach them to the floor with carpet tape. Make sure that you have a light switch at the top of the stairs and the bottom of the stairs. If you do not have them, ask someone to add them for you. What else can I do to help prevent falls? Wear shoes that: Do not have high heels. Have rubber bottoms. Are comfortable and fit you well. Are closed at the toe. Do not wear sandals. If you use a stepladder: Make sure that it is fully opened. Do not climb a closed stepladder. Make sure that both sides of the stepladder are locked into place. Ask someone to hold it for you, if  possible. Clearly mark and make sure that you can see: Any grab bars or handrails. First and last steps. Where the edge of each step is. Use tools that help you move around (mobility aids) if they are needed. These include: Canes. Walkers. Scooters. Crutches. Turn on the lights when you go into a dark area. Replace any light bulbs as soon as they burn out. Set up your furniture so you have a clear path. Avoid moving your furniture around. If any of your floors are uneven, fix them. If there are any pets around you, be aware of where they are. Review your medicines with your doctor. Some medicines can make you feel dizzy. This can increase your chance of falling. Ask your doctor what other things that you can do to help prevent falls. This information is not intended to replace advice given to you by your health care provider. Make sure you discuss any questions you have with your health care provider. Document Released: 10/31/2008 Document Revised: 06/12/2015 Document Reviewed: 02/08/2014 Elsevier Interactive Patient Education  2017 [employer].

## 2020-08-25 NOTE — Progress Notes (Signed)
Subjective:   SUSHMA SAHADEO is a 75 y.o. female who presents for Medicare Annual (Subsequent) preventive examination.  Review of Systems     Cardiac Risk Factors include: advanced age (>49mn, >>49women);diabetes mellitus;dyslipidemia;hypertension;obesity (BMI >30kg/m2);sedentary lifestyle     Objective:    Today's Vitals   08/25/20 1258  BP: 122/70  Pulse: 73  Resp: 16  Temp: (!) 97.4 F (36.3 C)  TempSrc: Temporal  SpO2: 98%  Weight: 218 lb 9.6 oz (99.2 kg)  Height: '5\' 5"'$  (1.651 m)   Body mass index is 36.38 kg/m.  Advanced Directives 08/25/2020 06/19/2020 10/31/2019 08/13/2019 10/10/2018 10/29/2017 10/10/2017  Does Patient Have a Medical Advance Directive? Yes Yes Yes Yes Yes Yes Yes  Type of AParamedicof AHospersLiving will Living will H[place]Living will [place]Living will [place] MountainLiving will - -  Does patient want to make changes to medical advance directive? - - No - Patient declined No - Patient declined No - Patient declined - No - Patient declined  Copy of HB[place]in Chart? No - copy requested - - No - copy requested No - copy requested - -  Would patient like information on creating a medical advance directive? - - - - - - -  Pre-existing out of facility DNR order (yellow form or pink MOST form) - - - - - - -    Current Medications (verified) Outpatient Encounter Medications as of 08/25/2020  Medication Sig   bisoprolol (ZEBETA) 5 MG tablet TAKE 1/2 TABLET (2.5 MG TOTAL) BY MOUTH DAILY. Please keep upcoming appt for future refills. (Patient taking differently: Take 2.5 mg by mouth every evening. TAKE 1/2 TABLET (2.5 MG TOTAL) BY MOUTH DAILY. Please keep upcoming appt for future refills.)   Cholecalciferol (VITAMIN D3) 50 MCG (2000 UT) TABS Take 6,000 Units by mouth every evening.   fluticasone (FLONASE) 50 MCG/ACT nasal spray Place 1 spray into both nostrils  daily as needed for allergies.   meclizine (ANTIVERT) 12.5 MG tablet Take 1 tablet (12.5 mg total) by mouth 3 (three) times daily as needed for dizziness.   Multiple Vitamin (MULTIVITAMIN WITH MINERALS) TABS tablet Take 1 tablet by mouth every evening.   spironolactone (ALDACTONE) 25 MG tablet Take 0.5 tablets (12.5 mg total) by mouth daily. Please keep upcoming appt for future refills (Patient taking differently: Take 12.5 mg by mouth in the morning. Please keep upcoming appt for future refills)   vitamin C (ASCORBIC ACID) 500 MG tablet Take 500 mg by mouth every evening.   Zinc 50 MG TABS Take 50 mg by mouth every other day. In the evening   albuterol (VENTOLIN HFA) 108 (90 Base) MCG/ACT inhaler Inhale 2 puffs into the lungs every 4 (four) hours as needed for wheezing or shortness of breath.  (Patient not taking: Reported on 08/25/2020)   Ascorbic Acid (VITAMIN C PO) Take 1 tablet by mouth every evening. (Patient not taking: Reported on 08/25/2020)   Vitamin E 670 MG (1000 UT) CAPS Take 1,000 Units by mouth 2 (two) times a week. In the evening (Patient not taking: Reported on 08/25/2020)   [DISCONTINUED] COVID-19 mRNA Vac-TriS, Pfizer, (PFIZER-BIONT COVID-19 VAC-TRIS) SUSP injection Inject into the muscle. (Patient not taking: Reported on 06/11/2020)   No facility-administered encounter medications on file as of 08/25/2020.    Allergies (verified) Naproxen sodium, Shellfish allergy, Contrast media [iodinated diagnostic agents], Crestor [rosuvastatin], Metrizamide, Atorvastatin, Lisinopril, Penicillins, and Simvastatin  History: Past Medical History:  Diagnosis Date   AICD (automatic cardioverter/defibrillator) present    Allergic state 05/17/2016   Dr Ernst Bowler asthma and allergy specialist.   Allergy    Arthritis    left knee pain, MRI in 2008-tricompartmental  degenerate changes, mucoid degeneration of ACL, post horn meniscal tear and ant horn meniscal tear   Asthma    Breast cancer in female  [facility] 2000   left; S/P lumpectomy, radiation and chemotherapy   CHF (congestive heart failure) ([place])    Colon polyp    unclear pathology   Hyperlipidemia    Hypertension    LBBB (left bundle branch block)    Low back pain    left L3-4 injected   Lymphedema of left arm    Nonischemic cardiomyopathy ([place])    Presence of permanent cardiac pacemaker    Preventative health care 05/18/2016   Travel sickness 05/12/2015   Unspecified constipation 04/22/2012   Vertigo    Past Surgical History:  Procedure Laterality Date   APPENDECTOMY  2009   Dr. Lucia Gaskins   BI-VENTRICULAR IMPLANTABLE CARDIOVERTER DEFIBRILLATOR N/A 11/16/2013   Procedure: BI-VENTRICULAR IMPLANTABLE CARDIOVERTER DEFIBRILLATOR  (CRT-D);  Surgeon: Evans Lance, MD;  Location: [facility] CATH LAB;  Service: Cardiovascular;  Laterality: N/A;   BI-VENTRICULAR IMPLANTABLE CARDIOVERTER DEFIBRILLATOR  (CRT-D)  11/16/13   MDT CRTD implanted by Dr Lovena Le for NICM, CHF, and LBBB   BIV ICD GENERATOR CHANGEOUT N/A 06/19/2020   Procedure: BIV ICD GENERATOR CHANGEOUT;  Surgeon: Evans Lance, MD;  Location: [facility] CV LAB;  Service: Cardiovascular;  Laterality: N/A;   BREAST BIOPSY Left 2000   BREAST LUMPECTOMY Left 2000   CARDIAC CATHETERIZATION  ~ 2010   "no blockage"   CARPAL TUNNEL RELEASE Bilateral 2007   Dr. Daylene Katayama   COLONOSCOPY     KNEE ARTHROSCOPY Right    POLYPECTOMY     TONSILLECTOMY     TRIGGER FINGER RELEASE Bilateral 2007   VAGINAL HYSTERECTOMY     Paritial   Family History  Problem Relation Age of Onset   Heart attack Mother 19   Diabetes Sister    Diabetes Brother    Coronary artery disease Other        Female first degree relative <60   Hyperlipidemia Other    Hypertension Other    Cancer Other        prostate, 1st degree relative <50   Coronary artery disease Maternal Grandmother    Colon cancer Maternal Aunt    Cancer Daughter    Allergic rhinitis Neg Hx    Angioedema Neg Hx    Asthma Neg Hx    Eczema Neg Hx     Immunodeficiency Neg Hx    Urticaria Neg Hx    Stomach cancer Neg Hx    Rectal cancer Neg Hx    Esophageal cancer Neg Hx    Liver cancer Neg Hx    Colon polyps Neg Hx    Social History   Socioeconomic History   Marital status: Widowed    Spouse name: Not on file   Number of children: 2   Years of education: 14   Highest education level: Not on file  Occupational History    Employer: DISABLED  Tobacco Use   Smoking status: Former    Packs/day: 0.10    Years: 2.00    Pack years: 0.20    Types: Cigarettes    Quit date: 01/18/1970    Years since quitting: 50.6  Smokeless tobacco: Never   Tobacco comments:    "smoked maybe 1 pack/month when I did smoke  Vaping Use   Vaping Use: Never used  Substance and Sexual Activity   Alcohol use: No   Drug use: No   Sexual activity: Never    Birth control/protection: Surgical, Post-menopausal  Other Topics Concern   Not on file  Social History Narrative   Widow - husband died in 2003-04-15   Occupation - retired from working in Radiation protection practitioner   2 daughter - on lives in [place], other in [place]   Remote history of tobacco but none in 40 years   Caffeine- coffee, 1 cup daily; 1 energy drink daily (V8)   Social Determinants of Radio broadcast assistant Strain: Low Risk    Difficulty of Paying Living Expenses: Not hard at all  Food Insecurity: No Food Insecurity   Worried About Charity fundraiser in the Last Year: Never true   Arboriculturist in the Last Year: Never true  Transportation Needs: No Transportation Needs   Lack of Transportation (Medical): No   Lack of Transportation (Non-Medical): No  Physical Activity: Inactive   Days of Exercise per Week: 0 days   Minutes of Exercise per Session: 0 min  Stress: No Stress Concern Present   Feeling of Stress : Not at all  Social Connections: Moderately Isolated   Frequency of Communication with Friends and Family: More than three times a week   Frequency of Social Gatherings with  Friends and Family: More than three times a week   Attends Religious Services: More than 4 times per year   Active Member of [employer] or Organizations: No   Attends Archivist Meetings: Never   Marital Status: Widowed    Tobacco Counseling Counseling given: Not Answered Tobacco comments: "smoked maybe 1 pack/month when I did smoke   Clinical Intake:  Pre-visit preparation completed: Yes        Nutritional Status: BMI > 30  Obese Nutritional Risks: None Diabetes: Yes CBG done?: No Did pt. bring in CBG monitor from home?: No  How often do you need to have someone help you when you read instructions, pamphlets, or other written materials from your doctor or pharmacy?: 1 - Never Diabetes:  Is the patient diabetic?  Yes  If diabetic, was a CBG obtained today?  No  Did the patient bring in their glucometer from home?  No  How often do you monitor your CBG's? never.   Financial Strains and Diabetes Management:  Are you having any financial strains with the device, your supplies or your medication? No .  Does the patient want to be seen by Chronic Care Management for management of their diabetes?  No  Would the patient like to be referred to a Nutritionist or for Diabetic Management?  No   Diabetic Exams:  Diabetic Eye Exam: Completed 09/2019.   Diabetic Foot Exam:  Pt has been advised about the importance in completing this exam.To be completed by PCP.    Interpreter Needed?: No  Information entered by :: Caroleen Hamman LPN   Activities of Daily Living In your present state of health, do you have any difficulty performing the following activities: 08/25/2020 06/19/2020  Hearing? N N  Vision? N -  Difficulty concentrating or making decisions? N N  Walking or climbing stairs? N N  Dressing or bathing? N N  Doing errands, shopping? N -  Preparing Food and eating ? N -  Using the Toilet? N -  In the past six months, have you accidently leaked urine? N -  Do you  have problems with loss of bowel control? N -  Managing your Medications? N -  Managing your Finances? N -  Housekeeping or managing your Housekeeping? N -  Some recent data might be hidden    Patient Care Team: Mosie Lukes, MD as PCP - General (Family Medicine) Evans Lance, MD as Consulting Physician (Cardiology) Milus Banister, MD as Consulting Physician (Gastroenterology) Ernst Bowler Gwenith Daily, MD as Consulting Physician (Allergy and Immunology) [name], MD as Consulting Physician (Oncology) Lelon Perla, MD as Consulting Physician (Cardiology) Melissa Montane, MD as Consulting Physician (Otolaryngology)  Indicate any recent Medical Services you may have received from other than [facility] providers in the past year (date may be approximate).     Assessment:   This is a routine wellness examination for Janaisa.  Hearing/Vision screen Hearing Screening - Comments:: No issues Vision Screening - Comments:: Last eye exam-09/2019-Has an upcoming appt.-Dr. Rex Kras  Dietary issues and exercise activities discussed: Current Exercise Habits: The patient does not participate in regular exercise at present, Exercise limited by: None identified   Goals Addressed             This Visit's Progress    Increase physical activity   Not on track      Depression Screen PHQ 2/9 Scores 08/25/2020 05/22/2019 05/17/2016 05/12/2015  PHQ - 2 Score 0 0 0 0  PHQ- 9 Score - 2 - -    Fall Risk Fall Risk  08/25/2020 08/13/2019 05/17/2016 09/29/2015 05/12/2015  Falls in the past year? 0 0 No No No  Number falls in past yr: 0 0 - - -  Injury with Fall? 0 0 - - -  Follow up Falls prevention discussed Education provided;Falls prevention discussed - - -    FALL RISK PREVENTION PERTAINING TO THE HOME:  Any stairs in or around the home? Yes  If so, are there any without handrails? No  Home free of loose throw rugs in walkways, pet beds, electrical cords, etc? Yes  Adequate lighting in your  home to reduce risk of falls? Yes   ASSISTIVE DEVICES UTILIZED TO PREVENT FALLS:  Life alert? No  Use of a cane, walker or w/c? No  Grab bars in the bathroom? No  Shower chair or bench in shower? No  Elevated toilet seat or a handicapped toilet? No   TIMED UP AND GO:  Was the test performed? Yes .  Length of time to ambulate 10 feet: 10 sec.   Gait steady and fast without use of assistive device  Cognitive Function:Normal cognitive status assessed by direct observation by this Nurse Health Advisor. No abnormalities found.   MMSE - Mini Mental State Exam 05/17/2016  Orientation to time 5  Orientation to Place 5  Registration 3  Attention/ Calculation 5  Recall 2  Language- name 2 objects 2  Language- repeat 1  Language- follow 3 step command 3  Language- read & follow direction 1  Write a sentence 1  Copy design 1  Total score 29        Immunizations Immunization History  Administered Date(s) Administered   PFIZER Comirnaty(Gray Top)Covid-19 Tri-Sucrose Vaccine 05/22/2020   PFIZER(Purple Top)SARS-COV-2 Vaccination 02/24/2019, 03/17/2019, 09/15/2019   Td 09/06/2006    TDAP status: Due, Education has been provided regarding the importance of this vaccine. Advised may receive this vaccine at local pharmacy  or Health Dept. Aware to provide a copy of the vaccination record if obtained from local pharmacy or Health Dept. Verbalized acceptance and understanding.  Flu Vaccine status: Declined, Education has been provided regarding the importance of this vaccine but patient still declined. Advised may receive this vaccine at local pharmacy or Health Dept. Aware to provide a copy of the vaccination record if obtained from local pharmacy or Health Dept. Verbalized acceptance and understanding.  Pneumococcal vaccine status: Declined,  Education has been provided regarding the importance of this vaccine but patient still declined. Advised may receive this vaccine at local pharmacy or  Health Dept. Aware to provide a copy of the vaccination record if obtained from local pharmacy or Health Dept. Verbalized acceptance and understanding.   Covid-19 vaccine status: Completed vaccines  Qualifies for Shingles Vaccine? Yes   Zostavax completed No   Shingrix Completed?: No.    Education has been provided regarding the importance of this vaccine. Patient has been advised to call insurance company to determine out of pocket expense if they have not yet received this vaccine. Advised may also receive vaccine at local pharmacy or Health Dept. Verbalized acceptance and understanding.  Screening Tests Health Maintenance  Topic Date Due   FOOT EXAM  Never done   Zoster Vaccines- Shingrix (1 of 2) Never done   PNA vac Low Risk Adult (1 of 2 - PCV13) Never done   TETANUS/TDAP  09/05/2016   URINE MICROALBUMIN  05/10/2017   OPHTHALMOLOGY EXAM  09/20/2019   HEMOGLOBIN A1C  05/21/2020   INFLUENZA VACCINE  08/18/2020   COLONOSCOPY (Pts 45-58yr Insurance coverage will need to be confirmed)  01/14/2022   DEXA SCAN  Completed   COVID-19 Vaccine  Completed   Hepatitis C Screening  Completed   HPV VACCINES  Aged Out    Health Maintenance  Health Maintenance Due  Topic Date Due   FOOT EXAM  Never done   Zoster Vaccines- Shingrix (1 of 2) Never done   PNA vac Low Risk Adult (1 of 2 - PCV13) Never done   TETANUS/TDAP  09/05/2016   URINE MICROALBUMIN  05/10/2017   OPHTHALMOLOGY EXAM  09/20/2019   HEMOGLOBIN A1C  05/21/2020   INFLUENZA VACCINE  08/18/2020    Colorectal cancer screening: Type of screening: Colonoscopy. Completed 01/14/2017. Repeat every 5 years  Mammogram status: Completed Bilateral 10/29/2019. Repeat every year  Bone Density status: Ordered today. Pt provided with contact info and advised to call to schedule appt.  Lung Cancer Screening: (Low Dose CT Chest recommended if Age 75-80years, 30 pack-year currently smoking OR have quit w/in 15years.) does not qualify.      Additional Screening:  Hepatitis C Screening:  Completed 05/12/2015  Vision Screening: Recommended annual ophthalmology exams for early detection of glaucoma and other disorders of the eye. Is the patient up to date with their annual eye exam?  Yes  Who is the provider or what is the name of the office in which the patient attends annual eye exams? Dr. TRex Kras Dental Screening: Recommended annual dental exams for proper oral hygiene  Community Resource Referral / Chronic Care Management: CRR required this visit?  No   CCM required this visit?  No      Plan:     I have personally reviewed and noted the following in the patient's chart:   Medical and social history Use of alcohol, tobacco or illicit drugs  Current medications and supplements including opioid prescriptions.  Functional ability and status  Nutritional status Physical activity Advanced directives List of other physicians Hospitalizations, surgeries, and ER visits in previous 12 months Vitals Screenings to include cognitive, depression, and falls Referrals and appointments  In addition, I have reviewed and discussed with patient certain preventive protocols, quality metrics, and best practice recommendations. A written personalized care plan for preventive services as well as general preventive health recommendations were provided to patient.   Patient to access avs on mychart.  Marta Antu, LPN   [id number]  Nurse Health Advisor  Nurse Notes: None

## 2020-09-18 ENCOUNTER — Ambulatory Visit (INDEPENDENT_AMBULATORY_CARE_PROVIDER_SITE_OTHER): Payer: Medicare HMO

## 2020-09-18 DIAGNOSIS — I428 Other cardiomyopathies: Secondary | ICD-10-CM

## 2020-09-23 LAB — CUP PACEART REMOTE DEVICE CHECK
Battery Remaining Longevity: 85 mo
Battery Voltage: 3.02 V
Brady Statistic AP VP Percent: 0.52 %
Brady Statistic AP VS Percent: 0.02 %
Brady Statistic AS VP Percent: 95.64 %
Brady Statistic AS VS Percent: 3.82 %
Brady Statistic RA Percent Paced: 0.54 %
Brady Statistic RV Percent Paced: 71.74 %
Date Time Interrogation Session: 20220901033523
HighPow Impedance: 74 Ohm
Implantable Lead Implant Date: 20151030
Implantable Lead Implant Date: 20151030
Implantable Lead Implant Date: 20151030
Implantable Lead Location: 753858
Implantable Lead Location: 753859
Implantable Lead Location: 753860
Implantable Lead Model: 4598
Implantable Lead Model: 5076
Implantable Lead Model: 6935
Implantable Pulse Generator Implant Date: 20220602
Lead Channel Impedance Value: 1083 Ohm
Lead Channel Impedance Value: 1178 Ohm
Lead Channel Impedance Value: 1311 Ohm
Lead Channel Impedance Value: 1368 Ohm
Lead Channel Impedance Value: 247.704
Lead Channel Impedance Value: 264.733
Lead Channel Impedance Value: 278.667
Lead Channel Impedance Value: 304 Ohm
Lead Channel Impedance Value: 342 Ohm
Lead Channel Impedance Value: 352 Ohm
Lead Channel Impedance Value: 387.415
Lead Channel Impedance Value: 418 Ohm
Lead Channel Impedance Value: 418 Ohm
Lead Channel Impedance Value: 608 Ohm
Lead Channel Impedance Value: 722 Ohm
Lead Channel Impedance Value: 836 Ohm
Lead Channel Impedance Value: 931 Ohm
Lead Channel Impedance Value: 931 Ohm
Lead Channel Pacing Threshold Amplitude: 0.625 V
Lead Channel Pacing Threshold Amplitude: 0.875 V
Lead Channel Pacing Threshold Amplitude: 1.625 V
Lead Channel Pacing Threshold Pulse Width: 0.4 ms
Lead Channel Pacing Threshold Pulse Width: 0.4 ms
Lead Channel Pacing Threshold Pulse Width: 0.8 ms
Lead Channel Sensing Intrinsic Amplitude: 14 mV
Lead Channel Sensing Intrinsic Amplitude: 14 mV
Lead Channel Sensing Intrinsic Amplitude: 3.625 mV
Lead Channel Sensing Intrinsic Amplitude: 3.625 mV
Lead Channel Setting Pacing Amplitude: 1.75 V
Lead Channel Setting Pacing Amplitude: 2 V
Lead Channel Setting Pacing Amplitude: 2.5 V
Lead Channel Setting Pacing Pulse Width: 0.3 ms
Lead Channel Setting Pacing Pulse Width: 0.8 ms
Lead Channel Setting Sensing Sensitivity: 0.3 mV

## 2020-09-24 ENCOUNTER — Other Ambulatory Visit: Payer: Self-pay | Admitting: Cardiology

## 2020-09-30 NOTE — Progress Notes (Signed)
Remote ICD transmission.   

## 2020-10-01 ENCOUNTER — Other Ambulatory Visit (HOSPITAL_BASED_OUTPATIENT_CLINIC_OR_DEPARTMENT_OTHER): Payer: Self-pay | Admitting: Family Medicine

## 2020-10-01 DIAGNOSIS — Z1231 Encounter for screening mammogram for malignant neoplasm of breast: Secondary | ICD-10-CM

## 2020-10-08 ENCOUNTER — Encounter: Payer: Medicare HMO | Admitting: Internal Medicine

## 2020-10-13 ENCOUNTER — Telehealth (HOSPITAL_BASED_OUTPATIENT_CLINIC_OR_DEPARTMENT_OTHER): Payer: Self-pay

## 2020-10-22 DIAGNOSIS — H25013 Cortical age-related cataract, bilateral: Secondary | ICD-10-CM | POA: Diagnosis not present

## 2020-10-22 DIAGNOSIS — H43393 Other vitreous opacities, bilateral: Secondary | ICD-10-CM | POA: Diagnosis not present

## 2020-10-22 DIAGNOSIS — H5203 Hypermetropia, bilateral: Secondary | ICD-10-CM | POA: Diagnosis not present

## 2020-10-22 DIAGNOSIS — E119 Type 2 diabetes mellitus without complications: Secondary | ICD-10-CM | POA: Diagnosis not present

## 2020-10-22 DIAGNOSIS — H18413 Arcus senilis, bilateral: Secondary | ICD-10-CM | POA: Diagnosis not present

## 2020-10-22 DIAGNOSIS — H43813 Vitreous degeneration, bilateral: Secondary | ICD-10-CM | POA: Diagnosis not present

## 2020-10-22 DIAGNOSIS — H524 Presbyopia: Secondary | ICD-10-CM | POA: Diagnosis not present

## 2020-10-22 DIAGNOSIS — H2513 Age-related nuclear cataract, bilateral: Secondary | ICD-10-CM | POA: Diagnosis not present

## 2020-10-29 ENCOUNTER — Other Ambulatory Visit: Payer: Self-pay

## 2020-10-29 ENCOUNTER — Inpatient Hospital Stay: Payer: Medicare HMO | Attending: Hematology & Oncology

## 2020-10-29 ENCOUNTER — Encounter: Payer: Self-pay | Admitting: Hematology & Oncology

## 2020-10-29 ENCOUNTER — Inpatient Hospital Stay: Payer: Medicare HMO | Admitting: Hematology & Oncology

## 2020-10-29 VITALS — BP 128/79 | HR 69 | Temp 98.0°F | Resp 18 | Ht 65.0 in | Wt 221.0 lb

## 2020-10-29 DIAGNOSIS — Z853 Personal history of malignant neoplasm of breast: Secondary | ICD-10-CM | POA: Diagnosis not present

## 2020-10-29 LAB — CMP (CANCER CENTER ONLY)
ALT: 18 U/L (ref 0–44)
AST: 22 U/L (ref 15–41)
Albumin: 4.3 g/dL (ref 3.5–5.0)
Alkaline Phosphatase: 67 U/L (ref 38–126)
Anion gap: 10 (ref 5–15)
BUN: 11 mg/dL (ref 8–23)
CO2: 25 mmol/L (ref 22–32)
Calcium: 10.7 mg/dL — ABNORMAL HIGH (ref 8.9–10.3)
Chloride: 104 mmol/L (ref 98–111)
Creatinine: 0.9 mg/dL (ref 0.44–1.00)
GFR, Estimated: >60 mL/min (ref >60)
Glucose, Bld: 82 mg/dL (ref 70–99)
Potassium: 3.8 mmol/L (ref 3.5–5.1)
Sodium: 139 mmol/L (ref 135–145)
Total Bilirubin: 0.4 mg/dL (ref 0.3–1.2)
Total Protein: 7.7 g/dL (ref 6.5–8.1)

## 2020-10-29 LAB — LACTATE DEHYDROGENASE: LDH: 286 U/L — ABNORMAL HIGH (ref 98–192)

## 2020-10-29 LAB — CBC WITH DIFFERENTIAL (CANCER CENTER ONLY)
Abs Immature Granulocytes: 0.01 10*3/uL (ref 0.00–0.07)
Basophils Absolute: 0.1 10*3/uL (ref 0.0–0.1)
Basophils Relative: 1 %
Eosinophils Absolute: 0.5 10*3/uL (ref 0.0–0.5)
Eosinophils Relative: 7 %
HCT: 37.3 % (ref 36.0–46.0)
Hemoglobin: 12.5 g/dL (ref 12.0–15.0)
Immature Granulocytes: 0 %
Lymphocytes Relative: 43 %
Lymphs Abs: 2.8 10*3/uL (ref 0.7–4.0)
MCH: 31.1 pg (ref 26.0–34.0)
MCHC: 33.5 g/dL (ref 30.0–36.0)
MCV: 92.8 fL (ref 80.0–100.0)
Monocytes Absolute: 0.5 10*3/uL (ref 0.1–1.0)
Monocytes Relative: 8 %
Neutro Abs: 2.6 10*3/uL (ref 1.7–7.7)
Neutrophils Relative %: 41 %
Platelet Count: 228 10*3/uL (ref 150–400)
RBC: 4.02 MIL/uL (ref 3.87–5.11)
RDW: 14.3 % (ref 11.5–15.5)
WBC Count: 6.4 10*3/uL (ref 4.0–10.5)
nRBC: 0 % (ref 0.0–0.2)

## 2020-10-29 NOTE — Progress Notes (Signed)
Hematology and Oncology Follow Up Visit  Brenda Marsh [id number] Dec 17, 1945 75 y.o. 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 Brenda Marsh is here today for follow-up. She comes back for her yearly visit.  As always, she is doing pretty well.  We see her yearly.  She I think is having some issues with the defibrillator.  I think she sees cardiology for this.  She has had no problems since we really last saw her.  She has had no issues with COVID.  She does not have a booster shot.  She has had no problems with fever.  There is no change in bowel or bladder habits.  There is no problems with cough or shortness of breath.  She has had no leg swelling.  She had no rashes.  Overall, I would say performance status is ECOG 1.    Medications:  Allergies as of 10/29/2020       Reactions   Atorvastatin Other (See Comments)   Myalgia, weakness   Naproxen Sodium Shortness Of Breath   Difficulty breathing   Shellfish Allergy Shortness Of Breath   Cant breath   Contrast Media [iodinated Diagnostic Agents] Nausea And Vomiting   Crestor [rosuvastatin] Other (See Comments)   Myalgia, weakness   Metrizamide Nausea And Vomiting   Lisinopril Swelling, Cough   Difficulty breathing   Penicillins Rash   Rash on arms   Simvastatin Other (See Comments)   Myalgia, weakness        Medication List        Accurate as of October 29, 2020 12:32 PM. If you have any questions, ask your nurse or doctor.          albuterol 108 (90 Base) MCG/ACT inhaler Commonly known as: VENTOLIN HFA Inhale 2 puffs into the lungs every 4 (four) hours as needed for wheezing or shortness of breath.   bisoprolol 5 MG tablet Commonly known as: ZEBETA TAKE 1/2 TABLET (2.5 MG TOTAL) BY MOUTH DAILY (KEEP UPCOMING APPOINTMENT FOR FUTURE REFILLS)   fluticasone 50 MCG/ACT nasal spray Commonly  known as: FLONASE Place 1 spray into both nostrils daily as needed for allergies.   meclizine 12.5 MG tablet Commonly known as: ANTIVERT Take 1 tablet (12.5 mg total) by mouth 3 (three) times daily as needed for dizziness.   multivitamin with minerals Tabs tablet Take 1 tablet by mouth every evening.   spironolactone 25 MG tablet Commonly known as: ALDACTONE Take 0.5 tablets (12.5 mg total) by mouth daily. Please keep upcoming appt for future refills What changed: when to take this   vitamin C 500 MG tablet Commonly known as: ASCORBIC ACID Take 500 mg by mouth every evening.   VITAMIN C PO Take 1 tablet by mouth every evening.   Vitamin D3 50 MCG (2000 UT) Tabs Take 6,000 Units by mouth every evening.   Vitamin E 670 MG (1000 UT) Caps Take 1,000 Units by mouth 2 (two) times a week. In the evening   Zinc 50 MG Tabs Take 50 mg by mouth every other day. In the evening        Allergies:   Past Medical History, Surgical history, Social history, and Family History were reviewed and updated.  Review of Systems: Review of Systems  Constitutional: Negative.   HENT: Negative.    Eyes: Negative.   Respiratory: Negative.  Cardiovascular: Negative.   Gastrointestinal: Negative.   Genitourinary: Negative.   Musculoskeletal: Negative.   Skin: Negative.   Neurological: Negative.   Endo/Heme/Allergies: Negative.   Psychiatric/Behavioral: Negative.      Physical Exam:  height is $RemoveB'5\' 5"'HGMZappY$  (1.651 m) and weight is 221 lb (100.2 kg). Her oral temperature is 98 F (36.7 C). Her blood pressure is 128/79 and her pulse is 69. Her respiration is 18 and oxygen saturation is 100%.   Wt Readings from Last 3 Encounters:  10/29/20 221 lb (100.2 kg)  08/25/20 218 lb 9.6 oz (99.2 kg)  06/19/20 219 lb (99.3 kg)    Physical Exam Vitals reviewed.  Constitutional:      Comments: Breast exam shows her right breast with no masses, edema or erythema.  There is no right axillary adenopathy.   Left breast is slightly contracted from surgery and radiation.  She has a well-healed lumpectomy scar at the 2 o'clock position.  There is some firmness at the lumpectomy site.  She has no distinct mass in the left breast.  There is no left axillary adenopathy.  HENT:     Head: Normocephalic and atraumatic.  Eyes:     Pupils: Pupils are equal, round, and reactive to light.  Cardiovascular:     Rate and Rhythm: Normal rate and regular rhythm.     Heart sounds: Normal heart sounds.  Pulmonary:     Effort: Pulmonary effort is normal.     Breath sounds: Normal breath sounds.  Abdominal:     General: Bowel sounds are normal.     Palpations: Abdomen is soft.  Musculoskeletal:        General: No tenderness or deformity. Normal range of motion.     Cervical back: Normal range of motion.     Comments: There is lymphedema in the left arm.  This is moderate.  There is no erythema or warmth.  She has good range of motion of the left arm.  Lymphadenopathy:     Cervical: No cervical adenopathy.  Skin:    General: Skin is warm and dry.     Findings: No erythema or rash.  Neurological:     Mental Status: She is alert and oriented to person, place, and time.  Psychiatric:        Behavior: Behavior normal.        Thought Content: Thought content normal.        Judgment: Judgment normal.    Lab Results  Component Value Date   WBC 6.4 10/29/2020   HGB 12.5 10/29/2020   HCT 37.3 10/29/2020   MCV 92.8 10/29/2020   PLT 228 10/29/2020   Lab Results  Component Value Date   IRON 56 12/21/2012   TIBC 429 12/21/2012   UIBC 373 12/21/2012   IRONPCTSAT 13 (L) 12/21/2012   Lab Results  Component Value Date   RBC 4.02 10/29/2020   No results found for: KPAFRELGTCHN, LAMBDASER, KAPLAMBRATIO No results found for: IGGSERUM, IGA, IGMSERUM No results found for: Odetta Pink, SPEI   Chemistry      Component Value Date/Time   NA 139 10/29/2020  1127   NA 140 06/10/2020 0955   NA 143 09/29/2016 1500   K 3.8 10/29/2020 1127   K 3.8 09/29/2016 1500   CL 104 10/29/2020 1127   CL 105 09/29/2016 1500   CO2 25 10/29/2020 1127   CO2 28 09/29/2016 1500   BUN 11 10/29/2020 1127   BUN  10 06/10/2020 0955   BUN 12 09/29/2016 1500   CREATININE 0.90 10/29/2020 1127   CREATININE 0.9 09/29/2016 1500      Component Value Date/Time   CALCIUM 10.7 (H) 10/29/2020 1127   CALCIUM 9.7 09/29/2016 1500   ALKPHOS 67 10/29/2020 1127   ALKPHOS 81 09/29/2016 1500   AST 22 10/29/2020 1127   ALT 18 10/29/2020 1127   ALT 23 09/29/2016 1500   BILITOT 0.4 10/29/2020 1127     Impression and Plan: Ms. Rabelo is a 75 year old African-American female with stage IIA ductal carcinoma the left breast. She had 1 lymph node positive. Her tumor was ER positive. She was on tamoxifen.  She is approximate 20 years out from treatment.  We will continue to follow her along yearly.     [name], MD 10/12/202212:32 PM

## 2020-10-30 ENCOUNTER — Ambulatory Visit: Payer: Medicare HMO | Admitting: Hematology & Oncology

## 2020-10-30 ENCOUNTER — Other Ambulatory Visit: Payer: Medicare HMO

## 2020-11-11 ENCOUNTER — Other Ambulatory Visit: Payer: Self-pay

## 2020-11-11 ENCOUNTER — Ambulatory Visit (HOSPITAL_BASED_OUTPATIENT_CLINIC_OR_DEPARTMENT_OTHER)
Admission: RE | Admit: 2020-11-11 | Discharge: 2020-11-11 | Disposition: A | Discharge status: Home / Self Care | Payer: Medicare HMO | Source: Ambulatory Visit | Attending: Family Medicine | Admitting: Family Medicine

## 2020-11-11 ENCOUNTER — Encounter (HOSPITAL_BASED_OUTPATIENT_CLINIC_OR_DEPARTMENT_OTHER): Payer: Self-pay

## 2020-11-11 DIAGNOSIS — Z1231 Encounter for screening mammogram for malignant neoplasm of breast: Secondary | ICD-10-CM

## 2020-11-11 DIAGNOSIS — M85851 Other specified disorders of bone density and structure, right thigh: Secondary | ICD-10-CM | POA: Diagnosis not present

## 2020-11-11 DIAGNOSIS — Z780 Asymptomatic menopausal state: Secondary | ICD-10-CM | POA: Insufficient documentation

## 2020-11-14 ENCOUNTER — Other Ambulatory Visit: Payer: Self-pay | Admitting: Family Medicine

## 2020-11-14 ENCOUNTER — Other Ambulatory Visit: Payer: Self-pay

## 2020-11-14 ENCOUNTER — Ambulatory Visit: Payer: Medicare HMO | Admitting: Internal Medicine

## 2020-11-14 VITALS — BP 116/60 | HR 77 | Ht 65.0 in | Wt 217.0 lb

## 2020-11-14 DIAGNOSIS — I10 Essential (primary) hypertension: Secondary | ICD-10-CM

## 2020-11-14 DIAGNOSIS — I428 Other cardiomyopathies: Secondary | ICD-10-CM | POA: Diagnosis not present

## 2020-11-14 DIAGNOSIS — Z95810 Presence of automatic (implantable) cardiac defibrillator: Secondary | ICD-10-CM | POA: Diagnosis not present

## 2020-11-14 DIAGNOSIS — R928 Other abnormal and inconclusive findings on diagnostic imaging of breast: Secondary | ICD-10-CM

## 2020-11-14 DIAGNOSIS — I5042 Chronic combined systolic (congestive) and diastolic (congestive) heart failure: Secondary | ICD-10-CM | POA: Diagnosis not present

## 2020-11-14 LAB — CUP PACEART INCLINIC DEVICE CHECK
Battery Remaining Longevity: 87 mo
Battery Voltage: 3 V
Brady Statistic AP VP Percent: 1.1 %
Brady Statistic AP VS Percent: 0.03 %
Brady Statistic AS VP Percent: 94.99 %
Brady Statistic AS VS Percent: 3.89 %
Brady Statistic RA Percent Paced: 1.11 %
Brady Statistic RV Percent Paced: 69.82 %
Date Time Interrogation Session: 20221028160229
HighPow Impedance: 72 Ohm
Implantable Lead Implant Date: 20151030
Implantable Lead Implant Date: 20151030
Implantable Lead Implant Date: 20151030
Implantable Lead Location: 753858
Implantable Lead Location: 753859
Implantable Lead Location: 753860
Implantable Lead Model: 4598
Implantable Lead Model: 5076
Implantable Lead Model: 6935
Implantable Pulse Generator Implant Date: 20220602
Lead Channel Impedance Value: 1007 Ohm
Lead Channel Impedance Value: 1064 Ohm
Lead Channel Impedance Value: 1235 Ohm
Lead Channel Impedance Value: 1368 Ohm
Lead Channel Impedance Value: 1406 Ohm
Lead Channel Impedance Value: 276.59 Ohm
Lead Channel Impedance Value: 279.484
Lead Channel Impedance Value: 295.059
Lead Channel Impedance Value: 342 Ohm
Lead Channel Impedance Value: 381.877
Lead Channel Impedance Value: 387.415
Lead Channel Impedance Value: 399 Ohm
Lead Channel Impedance Value: 456 Ohm
Lead Channel Impedance Value: 475 Ohm
Lead Channel Impedance Value: 703 Ohm
Lead Channel Impedance Value: 722 Ohm
Lead Channel Impedance Value: 836 Ohm
Lead Channel Impedance Value: 950 Ohm
Lead Channel Pacing Threshold Amplitude: 0.75 V
Lead Channel Pacing Threshold Amplitude: 1 V
Lead Channel Pacing Threshold Amplitude: 1.5 V
Lead Channel Pacing Threshold Pulse Width: 0.4 ms
Lead Channel Pacing Threshold Pulse Width: 0.4 ms
Lead Channel Pacing Threshold Pulse Width: 0.8 ms
Lead Channel Sensing Intrinsic Amplitude: 15.125 mV
Lead Channel Sensing Intrinsic Amplitude: 20.75 mV
Lead Channel Sensing Intrinsic Amplitude: 3.5 mV
Lead Channel Sensing Intrinsic Amplitude: 4.5 mV
Lead Channel Setting Pacing Amplitude: 1.5 V
Lead Channel Setting Pacing Amplitude: 2 V
Lead Channel Setting Pacing Amplitude: 2.5 V
Lead Channel Setting Pacing Pulse Width: 0.3 ms
Lead Channel Setting Pacing Pulse Width: 0.8 ms
Lead Channel Setting Sensing Sensitivity: 0.3 mV

## 2020-11-14 NOTE — Progress Notes (Signed)
HPI Brenda Marsh returns today for followup. She is a pleasant 75 yo woman with breast CA,s/p chemotherapy, LV dysfunction on maximal medical therapy, s/p biv ICD insertion. She is s/p Biv ICD gen change out and returns forr additional eval.  She denies chest pain or sob. Her lymphedema is better.  Allergies  Allergen Reactions   Atorvastatin Other (See Comments)    Myalgia, weakness   Naproxen Sodium Shortness Of Breath    Difficulty breathing   Shellfish Allergy Shortness Of Breath    Cant breath    Contrast Media [Iodinated Diagnostic Agents] Nausea And Vomiting   Crestor [Rosuvastatin] Other (See Comments)    Myalgia, weakness   Metrizamide Nausea And Vomiting   Lisinopril Swelling and Cough    Difficulty breathing   Penicillins Rash    Rash on arms   Simvastatin Other (See Comments)    Myalgia, weakness     Current Outpatient Medications  Medication Sig Dispense Refill   albuterol (VENTOLIN HFA) 108 (90 Base) MCG/ACT inhaler Inhale 2 puffs into the lungs every 4 (four) hours as needed for wheezing or shortness of breath.     Ascorbic Acid (VITAMIN C PO) Take 1 tablet by mouth every evening.     bisoprolol (ZEBETA) 5 MG tablet TAKE 1/2 TABLET (2.5 MG TOTAL) BY MOUTH DAILY (KEEP UPCOMING APPOINTMENT FOR FUTURE REFILLS) 45 tablet 1   Cholecalciferol (VITAMIN D3) 50 MCG (2000 UT) TABS Take 6,000 Units by mouth every evening.     fluticasone (FLONASE) 50 MCG/ACT nasal spray Place 1 spray into both nostrils daily as needed for allergies.     meclizine (ANTIVERT) 12.5 MG tablet Take 1 tablet (12.5 mg total) by mouth 3 (three) times daily as needed for dizziness. 90 tablet 3   Multiple Vitamin (MULTIVITAMIN WITH MINERALS) TABS tablet Take 1 tablet by mouth every evening.     spironolactone (ALDACTONE) 25 MG tablet Take 0.5 tablets (12.5 mg total) by mouth daily. Please keep upcoming appt for future refills (Patient taking differently: Take 12.5 mg by mouth in the morning.  Please keep upcoming appt for future refills) 45 tablet 1   vitamin C (ASCORBIC ACID) 500 MG tablet Take 500 mg by mouth every evening.     Vitamin E 670 MG (1000 UT) CAPS Take 1,000 Units by mouth 2 (two) times a week. In the evening     Zinc 50 MG TABS Take 50 mg by mouth every other day. In the evening     No current facility-administered medications for this visit.     Past Medical History:  Diagnosis Date   AICD (automatic cardioverter/defibrillator) present    Allergic state 05/17/2016   Dr Ernst Bowler asthma and allergy specialist.   Allergy    Arthritis    left knee pain, MRI in 2008-tricompartmental  degenerate changes, mucoid degeneration of ACL, post horn meniscal tear and ant horn meniscal tear   Asthma    Breast cancer in female [facility]) 2000   left; S/P lumpectomy, radiation and chemotherapy   CHF (congestive heart failure) ([place])    Colon polyp    unclear pathology   Hyperlipidemia    Hypertension    LBBB (left bundle branch block)    Low back pain    left L3-4 injected   Lymphedema of left arm    Nonischemic cardiomyopathy ([place])    Presence of permanent cardiac pacemaker    Preventative health care 05/18/2016   Travel sickness 05/12/2015   Unspecified  constipation 04/22/2012   Vertigo     ROS:   All systems reviewed and negative except as noted in the HPI.   Past Surgical History:  Procedure Laterality Date   APPENDECTOMY  2007-04-09   Dr. Lucia Gaskins   BI-VENTRICULAR IMPLANTABLE CARDIOVERTER DEFIBRILLATOR N/A 11/16/2013   Procedure: BI-VENTRICULAR IMPLANTABLE CARDIOVERTER DEFIBRILLATOR  (CRT-D);  Surgeon: Evans Lance, MD;  Location: [facility] CATH LAB;  Service: Cardiovascular;  Laterality: N/A;   BI-VENTRICULAR IMPLANTABLE CARDIOVERTER DEFIBRILLATOR  (CRT-D)  11/16/13   MDT CRTD implanted by Dr Lovena Le for NICM, CHF, and LBBB   BIV ICD GENERATOR CHANGEOUT N/A 06/19/2020   Procedure: BIV ICD GENERATOR CHANGEOUT;  Surgeon: Evans Lance, MD;  Location: [place] CV LAB;   Service: Cardiovascular;  Laterality: N/A;   BREAST BIOPSY Left 2000   BREAST LUMPECTOMY Left 04/09/98   CARDIAC CATHETERIZATION  ~ 04-08-2008   "no blockage"   CARPAL TUNNEL RELEASE Bilateral April 08, 2005   Dr. Daylene Katayama   COLONOSCOPY     KNEE ARTHROSCOPY Right    POLYPECTOMY     TONSILLECTOMY     TRIGGER FINGER RELEASE Bilateral Apr 08, 2005   VAGINAL HYSTERECTOMY     Paritial     Family History  Problem Relation Age of Onset   Heart attack Mother 45   Diabetes Sister    Diabetes Brother    Coronary artery disease Other        Female first degree relative <60   Hyperlipidemia Other    Hypertension Other    Cancer Other        prostate, 1st degree relative <50   Coronary artery disease Maternal Grandmother    Colon cancer Maternal Aunt    Cancer Daughter    Allergic rhinitis Neg Hx    Angioedema Neg Hx    Asthma Neg Hx    Eczema Neg Hx    Immunodeficiency Neg Hx    Urticaria Neg Hx    Stomach cancer Neg Hx    Rectal cancer Neg Hx    Esophageal cancer Neg Hx    Liver cancer Neg Hx    Colon polyps Neg Hx      Social History   Socioeconomic History   Marital status: Widowed    Spouse name: Not on file   Number of children: 2   Years of education: 14   Highest education level: Not on file  Occupational History    Employer: DISABLED  Tobacco Use   Smoking status: Former    Packs/day: 0.10    Years: 2.00    Pack years: 0.20    Types: Cigarettes    Quit date: 01/18/1970    Years since quitting: 50.8   Smokeless tobacco: Never   Tobacco comments:    "smoked maybe 1 pack/month when I did smoke  Vaping Use   Vaping Use: Never used  Substance and Sexual Activity   Alcohol use: No   Drug use: No   Sexual activity: Never    Birth control/protection: Surgical, Post-menopausal  Other Topics Concern   Not on file  Social History Narrative   Widow - husband died in 04/09/03   Occupation - retired from working in Radiation protection practitioner   2 daughter - on lives in [place], other in [place]   Remote  history of tobacco but none in 40 years   Caffeine- coffee, 1 cup daily; 1 energy drink daily (V8)   Social Determinants of Health   Financial Resource Strain: Low Risk    Difficulty  of Paying Living Expenses: Not hard at all  Food Insecurity: No Food Insecurity   Worried About [place] in the Last Year: Never true   [place] in the Last Year: Never true  Transportation Needs: No Transportation Needs   Lack of Transportation (Medical): No   Lack of Transportation (Non-Medical): No  Physical Activity: Inactive   Days of Exercise per Week: 0 days   Minutes of Exercise per Session: 0 min  Stress: No Stress Concern Present   Feeling of Stress : Not at all  Social Connections: Moderately Isolated   Frequency of Communication with Friends and Family: More than three times a week   Frequency of Social Gatherings with Friends and Family: More than three times a week   Attends Religious Services: More than 4 times per year   Active Member of [employer] or Organizations: No   Attends Archivist Meetings: Never   Marital Status: Widowed  Human resources officer Violence: Not At Risk   Fear of Current or Ex-Partner: No   Emotionally Abused: No   Physically Abused: No   Sexually Abused: No     BP 116/60   Pulse 77   Ht 5\' 5"  (1.651 m)   Wt 217 lb (98.4 kg)   SpO2 98%   BMI 36.11 kg/m   Physical Exam:  Well appearing NAD HEENT: Unremarkable Neck:  No JVD, no thyromegally Lymphatics:  No adenopathy Back:  No CVA tenderness Lungs:  Clear with no wheezes HEART:  Regular rate rhythm, no murmurs, no rubs, no clicks Abd:  soft, positive bowel sounds, no organomegally, no rebound, no guarding Ext:  2 plus pulses, no edema, no cyanosis, no clubbing Skin:  No rashes no nodules Neuro:  CN II through XII intact, motor grossly intact  DEVICE  Normal device function.  See PaceArt for details.   Assess/Plan:  1. Chronic systolic heart failure - she is class 2. She will  continue her current meds.  2. Obesity - I encouraged the patient to increase her physical activity and eat less.Her weight is down 4 lbs since her last visit. 3. ICD - she is s/p biv gen change out and doing well. Normal device function. 4. HTN - her bp is elevated a bit but is better at home. She is encouraged to reduce her salt intake.   Carleene Overlie Naveen Lorusso,MD

## 2020-11-14 NOTE — Patient Instructions (Signed)
Medication Instructions:  Your physician recommends that you continue on your current medications as directed. Please refer to the Current Medication list given to you today.  Labwork: None ordered.  Testing/Procedures: None ordered.  Follow-Up: Your physician wants you to follow-up in: one year with Cristopher Peru, MD or one of the following Advanced Practice Providers on your designated Care Team:   Tommye Standard, [place] Legrand Como "Jonni Sanger" Chalmers Cater, [place]  Remote monitoring is used to monitor your ICD from home. This monitoring reduces the number of office visits required to check your device to one time per year. It allows [place] to keep an eye on the functioning of your device to ensure it is working properly. You are scheduled for a device check from home on 12/18/2020. You may send your transmission at any time that day. If you have a wireless device, the transmission will be sent automatically. After your physician reviews your transmission, you will receive a postcard with your next transmission date.  Any Other Special Instructions Will Be Listed Below (If Applicable).  If you need a refill on your cardiac medications before your next appointment, please call your pharmacy.

## 2020-11-18 ENCOUNTER — Ambulatory Visit: Payer: Medicare HMO | Attending: Internal Medicine

## 2020-11-18 DIAGNOSIS — Z23 Encounter for immunization: Secondary | ICD-10-CM

## 2020-11-18 NOTE — Progress Notes (Signed)
   Covid-19 Vaccination Clinic  Name:  SHARLINE LEHANE    MRN: [id number] DOB: 06/19/1945  11/18/2020  Ms. Dungan was observed post Covid-19 immunization for 15 minutes without incident. She was provided with Vaccine Information Sheet and instruction to access the V-Safe system.   Ms. Glowacki was instructed to call 911 with any severe reactions post vaccine: Difficulty breathing  Swelling of face and throat  A fast heartbeat  A bad rash all over body  Dizziness and weakness   Immunizations Administered     Name Date Dose VIS Date Route   Pfizer Covid-19 Vaccine Bivalent Booster 11/18/2020 11:20 AM 0.3 mL 09/17/2020 Intramuscular   Manufacturer: [place]   Lot: [id number]   [place]: [phone]

## 2020-11-19 ENCOUNTER — Other Ambulatory Visit: Payer: Self-pay | Admitting: Family Medicine

## 2020-11-19 ENCOUNTER — Ambulatory Visit
Admission: RE | Admit: 2020-11-19 | Discharge: 2020-11-19 | Disposition: A | Discharge status: Home / Self Care | Payer: Medicare HMO | Source: Ambulatory Visit | Attending: Family Medicine | Admitting: Family Medicine

## 2020-11-19 DIAGNOSIS — R928 Other abnormal and inconclusive findings on diagnostic imaging of breast: Secondary | ICD-10-CM

## 2020-11-19 DIAGNOSIS — N6489 Other specified disorders of breast: Secondary | ICD-10-CM | POA: Diagnosis not present

## 2020-11-26 ENCOUNTER — Other Ambulatory Visit: Payer: Self-pay

## 2020-11-26 ENCOUNTER — Ambulatory Visit
Admission: RE | Admit: 2020-11-26 | Discharge: 2020-11-26 | Disposition: A | Discharge status: Home / Self Care | Payer: Medicare HMO | Source: Ambulatory Visit | Attending: Family Medicine | Admitting: Family Medicine

## 2020-11-26 DIAGNOSIS — R928 Other abnormal and inconclusive findings on diagnostic imaging of breast: Secondary | ICD-10-CM

## 2020-11-26 DIAGNOSIS — D241 Benign neoplasm of right breast: Secondary | ICD-10-CM | POA: Diagnosis not present

## 2020-11-26 DIAGNOSIS — N6315 Unspecified lump in the right breast, overlapping quadrants: Secondary | ICD-10-CM | POA: Diagnosis not present

## 2020-12-01 ENCOUNTER — Ambulatory Visit (HOSPITAL_BASED_OUTPATIENT_CLINIC_OR_DEPARTMENT_OTHER): Payer: Medicare HMO

## 2020-12-01 ENCOUNTER — Other Ambulatory Visit (HOSPITAL_BASED_OUTPATIENT_CLINIC_OR_DEPARTMENT_OTHER): Payer: Medicare HMO

## 2020-12-08 ENCOUNTER — Other Ambulatory Visit (HOSPITAL_BASED_OUTPATIENT_CLINIC_OR_DEPARTMENT_OTHER): Payer: Self-pay

## 2020-12-08 MED ORDER — PFIZER COVID-19 VAC BIVALENT 30 MCG/0.3ML IM SUSP
INTRAMUSCULAR | 0 refills | Status: DC
Start: 2020-11-18 — End: 2021-09-15
  Filled 2020-12-08: qty 0.3, 1d supply, fill #0

## 2020-12-15 ENCOUNTER — Ambulatory Visit (HOSPITAL_BASED_OUTPATIENT_CLINIC_OR_DEPARTMENT_OTHER): Payer: Medicare HMO

## 2020-12-15 ENCOUNTER — Other Ambulatory Visit (HOSPITAL_BASED_OUTPATIENT_CLINIC_OR_DEPARTMENT_OTHER): Payer: Medicare HMO

## 2021-03-15 ENCOUNTER — Other Ambulatory Visit: Payer: Self-pay | Admitting: Cardiology

## 2021-03-15 DIAGNOSIS — I5022 Chronic systolic (congestive) heart failure: Secondary | ICD-10-CM

## 2021-03-19 ENCOUNTER — Ambulatory Visit (INDEPENDENT_AMBULATORY_CARE_PROVIDER_SITE_OTHER): Payer: No Typology Code available for payment source

## 2021-03-19 DIAGNOSIS — I428 Other cardiomyopathies: Secondary | ICD-10-CM | POA: Diagnosis not present

## 2021-03-20 LAB — CUP PACEART REMOTE DEVICE CHECK
Battery Remaining Longevity: 82 mo
Battery Voltage: 3 V
Brady Statistic AP VP Percent: 0.55 %
Brady Statistic AP VS Percent: 0.02 %
Brady Statistic AS VP Percent: 95.68 %
Brady Statistic AS VS Percent: 3.75 %
Brady Statistic RA Percent Paced: 0.57 %
Brady Statistic RV Percent Paced: 68.31 %
Date Time Interrogation Session: 20230302022729
HighPow Impedance: 74 Ohm
Implantable Lead Implant Date: 20151030
Implantable Lead Implant Date: 20151030
Implantable Lead Implant Date: 20151030
Implantable Lead Location: 753858
Implantable Lead Location: 753859
Implantable Lead Location: 753860
Implantable Lead Model: 4598
Implantable Lead Model: 5076
Implantable Lead Model: 6935
Implantable Pulse Generator Implant Date: 20220602
Lead Channel Impedance Value: 1121 Ohm
Lead Channel Impedance Value: 1197 Ohm
Lead Channel Impedance Value: 1368 Ohm
Lead Channel Impedance Value: 1406 Ohm
Lead Channel Impedance Value: 262.136
Lead Channel Impedance Value: 272.032
Lead Channel Impedance Value: 278.667
Lead Channel Impedance Value: 342 Ohm
Lead Channel Impedance Value: 361 Ohm
Lead Channel Impedance Value: 381.877
Lead Channel Impedance Value: 403.247
Lead Channel Impedance Value: 418 Ohm
Lead Channel Impedance Value: 456 Ohm
Lead Channel Impedance Value: 703 Ohm
Lead Channel Impedance Value: 779 Ohm
Lead Channel Impedance Value: 836 Ohm
Lead Channel Impedance Value: 988 Ohm
Lead Channel Impedance Value: 988 Ohm
Lead Channel Pacing Threshold Amplitude: 0.875 V
Lead Channel Pacing Threshold Amplitude: 1 V
Lead Channel Pacing Threshold Amplitude: 1.75 V
Lead Channel Pacing Threshold Pulse Width: 0.4 ms
Lead Channel Pacing Threshold Pulse Width: 0.4 ms
Lead Channel Pacing Threshold Pulse Width: 0.8 ms
Lead Channel Sensing Intrinsic Amplitude: 11.75 mV
Lead Channel Sensing Intrinsic Amplitude: 11.75 mV
Lead Channel Sensing Intrinsic Amplitude: 4.125 mV
Lead Channel Sensing Intrinsic Amplitude: 4.125 mV
Lead Channel Setting Pacing Amplitude: 2 V
Lead Channel Setting Pacing Amplitude: 2 V
Lead Channel Setting Pacing Amplitude: 2.5 V
Lead Channel Setting Pacing Pulse Width: 0.3 ms
Lead Channel Setting Pacing Pulse Width: 0.8 ms
Lead Channel Setting Sensing Sensitivity: 0.3 mV

## 2021-03-26 NOTE — Progress Notes (Signed)
Remote ICD transmission.   

## 2021-04-20 NOTE — Progress Notes (Signed)
? ? ? ? ?HPI: FU dilated cardiomyopathy. Patient has a history of left bundle branch block. She underwent cardiac catheterization in [facility] in March of 2010 for reduced LV function. Ejection fraction was 35%. The left main was normal. The LAD had a proximal 25-30% lesion. The circumflex was normal. The right coronary artery had a 15% lesion. Patient was treated medically. Echocardiogram in August 2015 showed an ejection fraction of 25-30% and restrictive filling. Had biventricular ICD October 2015. Carotid Dopplers August 2019 showed less than 50% bilaterally.  Echocardiogram January 2021 showed ejection fraction 45 to 50%, mild mitral regurgitation, mild tricuspid regurgitation, mild aortic insufficiency.  Since she was last seen, she has some dyspnea on exertion but no orthopnea, PND, pedal edema, exertional chest pain or syncope. ? ?Current Outpatient Medications  ?Medication Sig Dispense Refill  ? albuterol (VENTOLIN HFA) 108 (90 Base) MCG/ACT inhaler Inhale 2 puffs into the lungs every 4 (four) hours as needed for wheezing or shortness of breath.    ? Ascorbic Acid (VITAMIN C PO) Take 1 tablet by mouth every evening.    ? bisoprolol (ZEBETA) 5 MG tablet TAKE 1/2 TABLET (2.5 MG TOTAL) BY MOUTH DAILY (KEEP UPCOMING APPOINTMENT FOR FUTURE REFILLS) 45 tablet 1  ? Cholecalciferol (VITAMIN D3) 50 MCG (2000 UT) TABS Take 6,000 Units by mouth every evening.    ? meclizine (ANTIVERT) 12.5 MG tablet Take 1 tablet (12.5 mg total) by mouth 3 (three) times daily as needed for dizziness. 90 tablet 3  ? Multiple Vitamin (MULTIVITAMIN WITH MINERALS) TABS tablet Take 1 tablet by mouth every evening.    ? spironolactone (ALDACTONE) 25 MG tablet TAKE 1/2 TABLET BY MOUTH EVERY DAY 45 tablet 3  ? vitamin C (ASCORBIC ACID) 500 MG tablet Take 500 mg by mouth every evening.    ? Vitamin E 670 MG (1000 UT) CAPS Take 1,000 Units by mouth 2 (two) times a week. In the evening    ? Zinc 50 MG TABS Take 50 mg by mouth every other day.  In the evening    ? COVID-19 mRNA bivalent vaccine, Pfizer, (PFIZER COVID-19 VAC BIVALENT) injection Inject into the muscle. 0.3 mL 0  ? fluticasone (FLONASE) 50 MCG/ACT nasal spray Place 1 spray into both nostrils daily as needed for allergies.    ? ?No current facility-administered medications for this visit.  ? ? ? ?Past Medical History:  ?Diagnosis Date  ? AICD (automatic cardioverter/defibrillator) present   ? Allergic state 05/17/2016  ? Dr Ernst Bowler asthma and allergy specialist.  ? Allergy   ? Arthritis   ? left knee pain, MRI in 2008-tricompartmental  degenerate changes, mucoid degeneration of ACL, post horn meniscal tear and ant horn meniscal tear  ? Asthma   ? Breast cancer in female [facility]) 2000  ? left; S/P lumpectomy, radiation and chemotherapy  ? CHF (congestive heart failure) ([place])   ? Colon polyp   ? unclear pathology  ? Hyperlipidemia   ? Hypertension   ? LBBB (left bundle branch block)   ? Low back pain   ? left L3-4 injected  ? Lymphedema of left arm   ? Nonischemic cardiomyopathy ([place])   ? Presence of permanent cardiac pacemaker   ? Preventative health care 05/18/2016  ? Travel sickness 05/12/2015  ? Unspecified constipation 04/22/2012  ? Vertigo   ? ? ?Past Surgical History:  ?Procedure Laterality Date  ? APPENDECTOMY  2009  ? Dr. Lucia Gaskins  ? BI-VENTRICULAR IMPLANTABLE CARDIOVERTER DEFIBRILLATOR N/A 11/16/2013  ?  Procedure: BI-VENTRICULAR IMPLANTABLE CARDIOVERTER DEFIBRILLATOR  (CRT-D);  Surgeon: Evans Lance, MD;  Location: [facility] CATH LAB;  Service: Cardiovascular;  Laterality: N/A;  ? BI-VENTRICULAR IMPLANTABLE CARDIOVERTER DEFIBRILLATOR  (CRT-D)  11/16/13  ? MDT CRTD implanted by Dr Lovena Le for NICM, CHF, and LBBB  ? BIV ICD GENERATOR CHANGEOUT N/A 06/19/2020  ? Procedure: BIV ICD GENERATOR CHANGEOUT;  Surgeon: Evans Lance, MD;  Location: [place] CV LAB;  Service: Cardiovascular;  Laterality: N/A;  ? BREAST BIOPSY Left 1998/04/18  ? BREAST LUMPECTOMY Left 04/18/98  ? CARDIAC CATHETERIZATION  ~ April 17, 2008  ? "no  blockage"  ? CARPAL TUNNEL RELEASE Bilateral 04-17-05  ? Dr. Daylene Katayama  ? COLONOSCOPY    ? KNEE ARTHROSCOPY Right   ? POLYPECTOMY    ? TONSILLECTOMY    ? TRIGGER FINGER RELEASE Bilateral 2005-04-17  ? VAGINAL HYSTERECTOMY    ? Paritial  ? ? ?Social History  ? ?Socioeconomic History  ? Marital status: Widowed  ?  Spouse name: Not on file  ? Number of children: 2  ? Years of education: 76  ? Highest education level: Not on file  ?Occupational History  ?  Employer: DISABLED  ?Tobacco Use  ? Smoking status: Former  ?  Packs/day: 0.10  ?  Years: 2.00  ?  Pack years: 0.20  ?  Types: Cigarettes  ?  Quit date: 01/18/1970  ?  Years since quitting: 51.2  ? Smokeless tobacco: Never  ? Tobacco comments:  ?  "smoked maybe 1 pack/month when I did smoke  ?Vaping Use  ? Vaping Use: Never used  ?Substance and Sexual Activity  ? Alcohol use: No  ? Drug use: No  ? Sexual activity: Never  ?  Birth control/protection: Surgical, Post-menopausal  ?Other Topics Concern  ? Not on file  ?Social History Narrative  ? Widow - husband died in 2003-04-18  ? Occupation - retired from working in Radiation protection practitioner  ? 2 daughter - on lives in [place], other in [place]  ? Remote history of tobacco but none in 40 years  ? Caffeine- coffee, 1 cup daily; 1 energy drink daily (V8)  ? ?Social Determinants of Health  ? ?Financial Resource Strain: Low Risk   ? Difficulty of Paying Living Expenses: Not hard at all  ?Food Insecurity: No Food Insecurity  ? Worried About [place] fundraiser in the Last Year: Never true  ? Ran Out of Food in the Last Year: Never true  ?Transportation Needs: No Transportation Needs  ? Lack of Transportation (Medical): No  ? Lack of Transportation (Non-Medical): No  ?Physical Activity: Inactive  ? Days of Exercise per Week: 0 days  ? Minutes of Exercise per Session: 0 min  ?Stress: No Stress Concern Present  ? Feeling of Stress : Not at all  ?Social Connections: Moderately Isolated  ? Frequency of Communication with Friends and Family: More than three times  a week  ? Frequency of Social Gatherings with Friends and Family: More than three times a week  ? Attends Religious Services: More than 4 times per year  ? Active Member of Clubs or Organizations: No  ? Attends Archivist Meetings: Never  ? Marital Status: Widowed  ?Intimate Partner Violence: Not At Risk  ? Fear of Current or Ex-Partner: No  ? Emotionally Abused: No  ? Physically Abused: No  ? Sexually Abused: No  ? ? ?Family History  ?Problem Relation Age of Onset  ? Heart attack Mother 73  ? Diabetes  Sister   ? Diabetes Brother   ? Coronary artery disease Other   ?     Female first degree relative <60  ? Hyperlipidemia Other   ? Hypertension Other   ? Cancer Other   ?     prostate, 1st degree relative <50  ? Coronary artery disease Maternal Grandmother   ? Colon cancer Maternal Aunt   ? Cancer Daughter   ? Allergic rhinitis Neg Hx   ? Angioedema Neg Hx   ? Asthma Neg Hx   ? Eczema Neg Hx   ? Immunodeficiency Neg Hx   ? Urticaria Neg Hx   ? Stomach cancer Neg Hx   ? Rectal cancer Neg Hx   ? Esophageal cancer Neg Hx   ? Liver cancer Neg Hx   ? Colon polyps Neg Hx   ? ? ?ROS: no fevers or chills, productive cough, hemoptysis, dysphasia, odynophagia, melena, hematochezia, dysuria, hematuria, rash, seizure activity, orthopnea, PND, pedal edema, claudication. Remaining systems are negative. ? ?Physical Exam: ?Well-developed well-nourished in no acute distress.  ?Skin is warm and dry.  ?HEENT is normal.  ?Neck is supple.  ?Chest is clear to auscultation with normal expansion.  ?Cardiovascular exam is regular rate and rhythm.  ?Abdominal exam nontender or distended. No masses palpated. ?Extremities show no edema. ?neuro grossly intact ? ?A/P ? ?1 cardiomyopathy-mildly reduced LV function on most recent echocardiogram.  Will repeat study. Previously did not tolerate ARB or ACE inhibitor and is not interested in hydralazine/nitrates.  We will continue beta-blocker at present dose. ?  ?2 hypertension-blood pressure  controlled.  Continue present regimen. She does not wish to be treated with other medications. ?  ?3 hyperlipidemia-intolerant to statins and declines other lipid-lowering medications. ?  ?4 ICD-Per

## 2021-04-22 ENCOUNTER — Ambulatory Visit (INDEPENDENT_AMBULATORY_CARE_PROVIDER_SITE_OTHER): Payer: No Typology Code available for payment source | Admitting: Cardiology

## 2021-04-22 ENCOUNTER — Encounter: Payer: Self-pay | Admitting: Cardiology

## 2021-04-22 VITALS — BP 122/78 | HR 68 | Ht 64.0 in | Wt 216.0 lb

## 2021-04-22 DIAGNOSIS — I5022 Chronic systolic (congestive) heart failure: Secondary | ICD-10-CM | POA: Diagnosis not present

## 2021-04-22 DIAGNOSIS — I5042 Chronic combined systolic (congestive) and diastolic (congestive) heart failure: Secondary | ICD-10-CM | POA: Diagnosis not present

## 2021-04-22 DIAGNOSIS — Z95810 Presence of automatic (implantable) cardiac defibrillator: Secondary | ICD-10-CM

## 2021-04-22 DIAGNOSIS — I2510 Atherosclerotic heart disease of native coronary artery without angina pectoris: Secondary | ICD-10-CM

## 2021-04-22 DIAGNOSIS — I10 Essential (primary) hypertension: Secondary | ICD-10-CM | POA: Diagnosis not present

## 2021-04-22 DIAGNOSIS — I428 Other cardiomyopathies: Secondary | ICD-10-CM | POA: Diagnosis not present

## 2021-04-22 MED ORDER — BISOPROLOL FUMARATE 5 MG PO TABS: ORAL_TABLET | ORAL | 3 refills | Status: DC

## 2021-04-22 MED ORDER — MECLIZINE HCL 12.5 MG PO TABS: 12.5000 mg | ORAL_TABLET | Freq: Three times a day (TID) | ORAL | 3 refills | Status: DC | PRN

## 2021-04-22 MED ORDER — SPIRONOLACTONE 25 MG PO TABS: 12.5000 mg | ORAL_TABLET | Freq: Every day | ORAL | 3 refills | Status: DC

## 2021-04-22 NOTE — Patient Instructions (Signed)
?  Testing/Procedures: ? ?Your physician has requested that you have an echocardiogram. Echocardiography is a painless test that uses sound waves to create images of your heart. It provides your doctor with information about the size and shape of your heart and how well your heart?s chambers and valves are working. This procedure takes approximately one hour. There are no restrictions for this procedure. [place] OFF[facility]-1ST FLOOR IMAGING DEPARTMENT ? ? ?Follow-Up: ?At [facility], you and your health needs are our priority.  As part of our continuing mission to provide you with exceptional heart care, we have created designated Provider Care Teams.  These Care Teams include your primary Cardiologist (physician) and Advanced Practice Providers (APPs -  Physician Assistants and Nurse Practitioners) who all work together to provide you with the care you need, when you need it. ? ?We recommend signing up for the patient portal called "MyChart".  Sign up information is provided on this After Visit Summary.  MyChart is used to connect with patients for Virtual Visits (Telemedicine).  Patients are able to view lab/test results, encounter notes, upcoming appointments, etc.  Non-urgent messages can be sent to your provider as well.   ?To learn more about what you can do with MyChart, go to [email or web address].   ? ?Your next appointment:   ?12 month(s) ? ?The format for your next appointment:   ?In Person ? ?Provider:   ?Kirk Ruths, MD  ? ? ? ?

## 2021-05-13 ENCOUNTER — Ambulatory Visit (HOSPITAL_BASED_OUTPATIENT_CLINIC_OR_DEPARTMENT_OTHER)
Admission: RE | Admit: 2021-05-13 | Discharge: 2021-05-13 | Disposition: A | Discharge status: Home / Self Care | Payer: No Typology Code available for payment source | Source: Ambulatory Visit | Attending: Cardiology | Admitting: Cardiology

## 2021-05-13 DIAGNOSIS — I428 Other cardiomyopathies: Secondary | ICD-10-CM | POA: Diagnosis not present

## 2021-05-13 LAB — ECHOCARDIOGRAM COMPLETE
AR max vel: 2.01 cm2
AV Area VTI: 1.89 cm2
AV Area mean vel: 2.01 cm2
AV Mean grad: 3 mmHg
AV Peak grad: 5.4 mmHg
Ao pk vel: 1.16 m/s
Area-P 1/2: 5.31 cm2
Calc EF: 47.7 %
S' Lateral: 3.8 cm
Single Plane A2C EF: 51 %
Single Plane A4C EF: 46.8 %

## 2021-05-13 NOTE — Progress Notes (Signed)
?  Echocardiogram ?2D Echocardiogram has been performed. ? ?Brenda Marsh ?05/13/2021, 12:32 PM ?

## 2021-05-15 ENCOUNTER — Encounter: Payer: Self-pay | Admitting: *Deleted

## 2021-06-16 ENCOUNTER — Ambulatory Visit (INDEPENDENT_AMBULATORY_CARE_PROVIDER_SITE_OTHER): Payer: No Typology Code available for payment source | Admitting: Family

## 2021-06-16 VITALS — BP 126/58 | HR 73 | Temp 98.2°F | Resp 16 | Wt 217.0 lb

## 2021-06-16 DIAGNOSIS — H0289 Other specified disorders of eyelid: Secondary | ICD-10-CM | POA: Diagnosis not present

## 2021-06-16 DIAGNOSIS — H02845 Edema of left lower eyelid: Secondary | ICD-10-CM | POA: Diagnosis not present

## 2021-06-16 MED ORDER — DOXYCYCLINE HYCLATE 100 MG PO TABS: 100.0000 mg | ORAL_TABLET | Freq: Two times a day (BID) | ORAL | 0 refills | Status: DC

## 2021-06-16 NOTE — Patient Instructions (Signed)
Start doxycycline twice daily (antibiotic). Apply warm compresses twice daily. Continue your over the counter allergy eye drops. Start your allegra. Call if symptoms worsen or if not improved in 2-3 days.

## 2021-06-16 NOTE — Progress Notes (Signed)
Subjective:   By signing my name below, I, Carylon Perches, attest that this documentation has been prepared under the direction and in the presence of Karie Chimera, NP 06/16/2021   Patient ID: Brenda Marsh, female    DOB: [birth year], 76 y.o.   MRN: [id number]  Chief Complaint  Patient presents with   Facial Swelling    Complains of swelling on left eyelid since yesterday.  Painful to the touch    HPI Patient is in today for an office visit.  Left Eye Swelling - She complains of a swollen left eye. She also has associated symptoms of an itchy eyelid. She reports that during this season, she has elevated environmental allergies. She also reports that she was around someone who was diagnosed with pink eye two weeks ago.  She does have Allegra at home but does not regularly take it at home. She takes 50 Mcg/ACT of Flonase.   Health Maintenance Due  Topic Date Due   FOOT EXAM  Never done   Zoster Vaccines- Shingrix (1 of 2) Never done   Pneumonia Vaccine 70+ Years old (1 - PCV) Never done   TETANUS/TDAP  09/05/2016   URINE MICROALBUMIN  05/10/2017   OPHTHALMOLOGY EXAM  09/20/2019   HEMOGLOBIN A1C  05/21/2020    Past Medical History:  Diagnosis Date   AICD (automatic cardioverter/defibrillator) present    Allergic state 05/17/2016   Dr Ernst Bowler asthma and allergy specialist.   Allergy    Arthritis    left knee pain, MRI in 2008-tricompartmental  degenerate changes, mucoid degeneration of ACL, post horn meniscal tear and ant horn meniscal tear   Asthma    Breast cancer in female [facility]) 2000   left; S/P lumpectomy, radiation and chemotherapy   CHF (congestive heart failure) ([place])    Colon polyp    unclear pathology   Hyperlipidemia    Hypertension    LBBB (left bundle branch block)    Low back pain    left L3-4 injected   Lymphedema of left arm    Nonischemic cardiomyopathy ([place])    Presence of permanent cardiac pacemaker    Preventative health care 05/18/2016    Travel sickness 05/12/2015   Unspecified constipation 04/22/2012   Vertigo     Past Surgical History:  Procedure Laterality Date   APPENDECTOMY  2009   Dr. Lucia Gaskins   BI-VENTRICULAR IMPLANTABLE CARDIOVERTER DEFIBRILLATOR N/A 11/16/2013   Procedure: BI-VENTRICULAR IMPLANTABLE CARDIOVERTER DEFIBRILLATOR  (CRT-D);  Surgeon: Evans Lance, MD;  Location: [facility] CATH LAB;  Service: Cardiovascular;  Laterality: N/A;   BI-VENTRICULAR IMPLANTABLE CARDIOVERTER DEFIBRILLATOR  (CRT-D)  11/16/13   MDT CRTD implanted by Dr Lovena Le for NICM, CHF, and LBBB   BIV ICD GENERATOR CHANGEOUT N/A 06/19/2020   Procedure: BIV ICD GENERATOR CHANGEOUT;  Surgeon: Evans Lance, MD;  Location: [facility] CV LAB;  Service: Cardiovascular;  Laterality: N/A;   BREAST BIOPSY Left 2000   BREAST LUMPECTOMY Left 2000   CARDIAC CATHETERIZATION  ~ 2010   "no blockage"   CARPAL TUNNEL RELEASE Bilateral 2007   Dr. Daylene Katayama   COLONOSCOPY     KNEE ARTHROSCOPY Right    POLYPECTOMY     TONSILLECTOMY     TRIGGER FINGER RELEASE Bilateral 2007   VAGINAL HYSTERECTOMY     Paritial    Family History  Problem Relation Age of Onset   Heart attack Mother 12   Diabetes Sister    Diabetes Brother    Coronary artery disease  Other        Female first degree relative <60   Hyperlipidemia Other    Hypertension Other    Cancer Other        prostate, 1st degree relative <50   Coronary artery disease Maternal Grandmother    Colon cancer Maternal Aunt    Cancer Daughter    Allergic rhinitis Neg Hx    Angioedema Neg Hx    Asthma Neg Hx    Eczema Neg Hx    Immunodeficiency Neg Hx    Urticaria Neg Hx    Stomach cancer Neg Hx    Rectal cancer Neg Hx    Esophageal cancer Neg Hx    Liver cancer Neg Hx    Colon polyps Neg Hx     Social History   Socioeconomic History   Marital status: Widowed    Spouse name: Not on file   Number of children: 2   Years of education: 14   Highest education level: Not on file  Occupational  History    Employer: DISABLED  Tobacco Use   Smoking status: Former    Packs/day: 0.10    Years: 2.00    Pack years: 0.20    Types: Cigarettes    Quit date: 01/18/1970    Years since quitting: 51.4   Smokeless tobacco: Never   Tobacco comments:    "smoked maybe 1 pack/month when I did smoke  Vaping Use   Vaping Use: Never used  Substance and Sexual Activity   Alcohol use: No   Drug use: No   Sexual activity: Never    Birth control/protection: Surgical, Post-menopausal  Other Topics Concern   Not on file  Social History Narrative   Widow - husband died in 2003/04/25   Occupation - retired from working in Radiation protection practitioner   2 daughter - on lives in [place], other in [place]   Remote history of tobacco but none in 40 years   Caffeine- coffee, 1 cup daily; 1 energy drink daily (V8)   Social Determinants of Health   Financial Resource Strain: Low Risk    Difficulty of Paying Living Expenses: Not hard at all  Food Insecurity: No Food Insecurity   Worried About Charity fundraiser in the Last Year: Never true   Arboriculturist in the Last Year: Never true  Transportation Needs: No Transportation Needs   Lack of Transportation (Medical): No   Lack of Transportation (Non-Medical): No  Physical Activity: Inactive   Days of Exercise per Week: 0 days   Minutes of Exercise per Session: 0 min  Stress: No Stress Concern Present   Feeling of Stress : Not at all  Social Connections: Moderately Isolated   Frequency of Communication with Friends and Family: More than three times a week   Frequency of Social Gatherings with Friends and Family: More than three times a week   Attends Religious Services: More than 4 times per year   Active Member of [employer] or Organizations: No   Attends Archivist Meetings: Never   Marital Status: Widowed  Human resources officer Violence: Not At Risk   Fear of Current or Ex-Partner: No   Emotionally Abused: No   Physically Abused: No   Sexually Abused: No     Outpatient Medications Prior to Visit  Medication Sig Dispense Refill   albuterol (VENTOLIN HFA) 108 (90 Base) MCG/ACT inhaler Inhale 2 puffs into the lungs every 4 (four) hours as needed for wheezing or shortness of  breath.     Ascorbic Acid (VITAMIN C PO) Take 1 tablet by mouth every evening.     bisoprolol (ZEBETA) 5 MG tablet TAKE 1/2 TABLET (2.5 MG TOTAL) BY MOUTH DAILY 45 tablet 3   Cholecalciferol (VITAMIN D3) 50 MCG (2000 UT) TABS Take 6,000 Units by mouth every evening.     COVID-19 mRNA bivalent vaccine, Pfizer, (PFIZER COVID-19 VAC BIVALENT) injection Inject into the muscle. 0.3 mL 0   meclizine (ANTIVERT) 12.5 MG tablet Take 1 tablet (12.5 mg total) by mouth 3 (three) times daily as needed for dizziness. 90 tablet 3   Multiple Vitamin (MULTIVITAMIN WITH MINERALS) TABS tablet Take 1 tablet by mouth every evening.     spironolactone (ALDACTONE) 25 MG tablet Take 0.5 tablets (12.5 mg total) by mouth daily. 45 tablet 3   vitamin C (ASCORBIC ACID) 500 MG tablet Take 500 mg by mouth every evening.     Vitamin E 670 MG (1000 UT) CAPS Take 1,000 Units by mouth 2 (two) times a week. In the evening     Zinc 50 MG TABS Take 50 mg by mouth every other day. In the evening     fluticasone (FLONASE) 50 MCG/ACT nasal spray Place 1 spray into both nostrils daily as needed for allergies.     No facility-administered medications prior to visit.    Allergies  Allergen Reactions   Atorvastatin Other (See Comments)    Myalgia, weakness   Naproxen Sodium Shortness Of Breath    Difficulty breathing   Shellfish Allergy Shortness Of Breath    Cant breath    Contrast Media [Iodinated Contrast Media] Nausea And Vomiting   Crestor [Rosuvastatin] Other (See Comments)    Myalgia, weakness   Metrizamide Nausea And Vomiting   Lisinopril Swelling and Cough    Difficulty breathing   Penicillins Rash    Rash on arms   Simvastatin Other (See Comments)    Myalgia, weakness    Review of Systems   Eyes:        (+) Swollen Left Eye      Objective:    Physical Exam Constitutional:      General: She is not in acute distress.    Appearance: Normal appearance. She is not ill-appearing.  HENT:     Head: Normocephalic and atraumatic.     Right Ear: External ear normal.     Left Ear: External ear normal.  Eyes:     Extraocular Movements: Extraocular movements intact.     Pupils: Pupils are equal, round, and reactive to light.     Comments: Left upper eyelid is swollen and tender to touch, no significant erythema. No scleral injection, no discharge.   Cardiovascular:     Rate and Rhythm: Normal rate.  Pulmonary:     Effort: Pulmonary effort is normal.  Skin:    General: Skin is warm and dry.  Neurological:     Mental Status: She is alert and oriented to person, place, and time.  Psychiatric:        Mood and Affect: Mood normal.        Behavior: Behavior normal.        Judgment: Judgment normal.    BP (!) 126/58 (BP Location: Right Arm, Patient Position: Sitting, Cuff Size: Large)   Pulse 73   Temp 98.2 F (36.8 C) (Oral)   Resp 16   Wt 217 lb (98.4 kg)   SpO2 100%   BMI 37.25 kg/m  Wt Readings from Last 3  Encounters:  06/16/21 217 lb (98.4 kg)  04/22/21 216 lb (98 kg)  11/14/20 217 lb (98.4 kg)       Assessment & Plan:   Problem List Items Addressed This Visit       Unprioritized   Pain and swelling of lower eyelid of left eye - Primary    New.  ? Related to allergies or early stye/cellulitis.  Recommended the following:   Start doxycycline twice daily (antibiotic). Apply warm compresses twice daily. Continue your over the counter allergy eye drops. Start your allegra. Call if symptoms worsen or if not improved in 2-3 days.          Meds ordered this encounter  Medications   doxycycline (VIBRA-TABS) 100 MG tablet    Sig: Take 1 tablet (100 mg total) by mouth 2 (two) times daily.    Dispense:  14 tablet    Refill:  0    Order Specific Question:    Supervising Provider    Answer:   [name] A [[id number]]    I, Nance Pear, NP, personally preformed the services described in this documentation.  All medical record entries made by the scribe were at my direction and in my presence.  I have reviewed the chart and discharge instructions (if applicable) and agree that the record reflects my personal performance and is accurate and complete. 06/16/2021   I,Amber Collins,acting as a scribe for Nance Pear, NP.,have documented all relevant documentation on the behalf of Nance Pear, NP,as directed by  Nance Pear, NP while in the presence of Nance Pear, NP.    Nance Pear, NP

## 2021-06-16 NOTE — Assessment & Plan Note (Signed)
New.  ? Related to allergies or early stye/cellulitis.  Recommended the following:   Start doxycycline twice daily (antibiotic). Apply warm compresses twice daily. Continue your over the counter allergy eye drops. Start your allegra. Call if symptoms worsen or if not improved in 2-3 days.

## 2021-06-18 ENCOUNTER — Ambulatory Visit (INDEPENDENT_AMBULATORY_CARE_PROVIDER_SITE_OTHER): Payer: No Typology Code available for payment source

## 2021-06-18 DIAGNOSIS — I428 Other cardiomyopathies: Secondary | ICD-10-CM

## 2021-06-18 LAB — CUP PACEART REMOTE DEVICE CHECK
Battery Remaining Longevity: 79 mo
Battery Voltage: 2.99 V
Brady Statistic AP VP Percent: 0.67 %
Brady Statistic AP VS Percent: 0.02 %
Brady Statistic AS VP Percent: 95.57 %
Brady Statistic AS VS Percent: 3.74 %
Brady Statistic RA Percent Paced: 0.69 %
Brady Statistic RV Percent Paced: 80.42 %
Date Time Interrogation Session: 20230601012406
HighPow Impedance: 77 Ohm
Implantable Lead Implant Date: 20151030
Implantable Lead Implant Date: 20151030
Implantable Lead Implant Date: 20151030
Implantable Lead Location: 753858
Implantable Lead Location: 753859
Implantable Lead Location: 753860
Implantable Lead Model: 4598
Implantable Lead Model: 5076
Implantable Lead Model: 6935
Implantable Pulse Generator Implant Date: 20220602
Lead Channel Impedance Value: 1026 Ohm
Lead Channel Impedance Value: 1083 Ohm
Lead Channel Impedance Value: 1140 Ohm
Lead Channel Impedance Value: 1349 Ohm
Lead Channel Impedance Value: 1368 Ohm
Lead Channel Impedance Value: 256.667
Lead Channel Impedance Value: 269.677
Lead Channel Impedance Value: 276.523
Lead Channel Impedance Value: 304 Ohm
Lead Channel Impedance Value: 366.603
Lead Channel Impedance Value: 393.735
Lead Channel Impedance Value: 399 Ohm
Lead Channel Impedance Value: 418 Ohm
Lead Channel Impedance Value: 456 Ohm
Lead Channel Impedance Value: 665 Ohm
Lead Channel Impedance Value: 760 Ohm
Lead Channel Impedance Value: 817 Ohm
Lead Channel Impedance Value: 988 Ohm
Lead Channel Pacing Threshold Amplitude: 0.75 V
Lead Channel Pacing Threshold Amplitude: 0.875 V
Lead Channel Pacing Threshold Amplitude: 2 V
Lead Channel Pacing Threshold Pulse Width: 0.4 ms
Lead Channel Pacing Threshold Pulse Width: 0.4 ms
Lead Channel Pacing Threshold Pulse Width: 0.8 ms
Lead Channel Sensing Intrinsic Amplitude: 15.5 mV
Lead Channel Sensing Intrinsic Amplitude: 15.5 mV
Lead Channel Sensing Intrinsic Amplitude: 3.625 mV
Lead Channel Sensing Intrinsic Amplitude: 3.625 mV
Lead Channel Setting Pacing Amplitude: 1.75 V
Lead Channel Setting Pacing Amplitude: 2 V
Lead Channel Setting Pacing Amplitude: 2.5 V
Lead Channel Setting Pacing Pulse Width: 0.3 ms
Lead Channel Setting Pacing Pulse Width: 0.8 ms
Lead Channel Setting Sensing Sensitivity: 0.3 mV

## 2021-06-24 ENCOUNTER — Ambulatory Visit: Payer: No Typology Code available for payment source | Admitting: Cardiology

## 2021-06-26 NOTE — Progress Notes (Signed)
Remote ICD transmission.   

## 2021-09-02 ENCOUNTER — Ambulatory Visit (INDEPENDENT_AMBULATORY_CARE_PROVIDER_SITE_OTHER): Payer: No Typology Code available for payment source

## 2021-09-02 ENCOUNTER — Telehealth: Payer: Self-pay

## 2021-09-02 ENCOUNTER — Other Ambulatory Visit: Payer: Self-pay | Admitting: Family Medicine

## 2021-09-02 VITALS — BP 129/80 | HR 70 | Temp 98.2°F | Resp 16 | Ht 64.0 in | Wt 215.4 lb

## 2021-09-02 DIAGNOSIS — Z0000 Encounter for general adult medical examination without abnormal findings: Secondary | ICD-10-CM | POA: Diagnosis not present

## 2021-09-02 DIAGNOSIS — M79671 Pain in right foot: Secondary | ICD-10-CM

## 2021-09-02 NOTE — Telephone Encounter (Signed)
Pt would like a referral foot pain in both feet.

## 2021-09-02 NOTE — Progress Notes (Addendum)
Subjective:   Brenda Marsh is a 76 y.o. female who presents for Medicare Annual (Subsequent) preventive examination.  Review of Systems      Cardiac Risk Factors include: advanced age (>15mn, >>23women);obesity (BMI >30kg/m2);diabetes mellitus;hypertension;dyslipidemia     Objective:    Today's Vitals   09/02/21 1309  BP: 129/80  Pulse: 70  Resp: 16  Temp: 98.2 F (36.8 C)  SpO2: 100%  Weight: 215 lb 6.4 oz (97.7 kg)  Height: '5\' 4"'$  (1.626 m)   Body mass index is 36.97 kg/m.     09/02/2021    1:06 PM 10/29/2020   12:24 PM 08/25/2020    1:08 PM 06/19/2020   12:30 PM 10/31/2019    3:45 PM 08/13/2019    9:56 AM 10/10/2018   10:59 AM  Advanced Directives  Does Patient Have a Medical Advance Directive? Yes Yes Yes Yes Yes Yes Yes  Type of AParamed[place]of AFaucettLiving will Living will;Healthcare Power of AHarmonyLiving will Living will HKingsleyLiving will HBartholomewLiving will HEldredLiving will  Does patient want to make changes to medical advance directive? No - Patient declined No - Patient declined   No - Patient declined No - Patient declined No - Patient declined  Copy of HPrairie Cityin Chart? No - copy requested No - copy requested No - copy requested   No - copy requested No - copy requested    Current Medications (verified) Outpatient Encounter Medications as of 09/02/2021  Medication Sig   albuterol (VENTOLIN HFA) 108 (90 Base) MCG/ACT inhaler Inhale 2 puffs into the lungs every 4 (four) hours as needed for wheezing or shortness of breath.   Ascorbic Acid (VITAMIN C PO) Take 1 tablet by mouth every evening.   bisoprolol (ZEBETA) 5 MG tablet TAKE 1/2 TABLET (2.5 MG TOTAL) BY MOUTH DAILY   Cholecalciferol (VITAMIN D3) 50 MCG (2000 UT) TABS Take 6,000 Units by mouth every evening.   COVID-19 mRNA bivalent vaccine, Pfizer, (PFIZER  COVID-19 VAC BIVALENT) injection Inject into the muscle.   doxycycline (VIBRA-TABS) 100 MG tablet Take 1 tablet (100 mg total) by mouth 2 (two) times daily.   meclizine (ANTIVERT) 12.5 MG tablet Take 1 tablet (12.5 mg total) by mouth 3 (three) times daily as needed for dizziness.   Multiple Vitamin (MULTIVITAMIN WITH MINERALS) TABS tablet Take 1 tablet by mouth every evening.   spironolactone (ALDACTONE) 25 MG tablet Take 0.5 tablets (12.5 mg total) by mouth daily.   vitamin C (ASCORBIC ACID) 500 MG tablet Take 500 mg by mouth every evening.   Vitamin E 670 MG (1000 UT) CAPS Take 1,000 Units by mouth 2 (two) times a week. In the evening   Zinc 50 MG TABS Take 50 mg by mouth every other day. In the evening   [DISCONTINUED] fluticasone (FLONASE) 50 MCG/ACT nasal spray Place 1 spray into both nostrils daily as needed for allergies.   No facility-administered encounter medications on file as of 09/02/2021.    Allergies (verified) Atorvastatin, Naproxen sodium, Shellfish allergy, Contrast media [iodinated contrast media], Crestor [rosuvastatin], Metrizamide, Lisinopril, Penicillins, and Simvastatin   History: Past Medical History:  Diagnosis Date   AICD (automatic cardioverter/defibrillator) present    Allergic state 05/17/2016   Dr GErnst Bowlerasthma and allergy specialist.   Allergy    Arthritis    left knee pain, MRI in 2008-tricompartmental  degenerate changes, mucoid degeneration of ACL, post horn  meniscal tear and ant horn meniscal tear   Asthma    Breast cancer in female [facility]) 2000   left; S/P lumpectomy, radiation and chemotherapy   CHF (congestive heart failure) ([place])    Colon polyp    unclear pathology   Hyperlipidemia    Hypertension    LBBB (left bundle branch block)    Low back pain    left L3-4 injected   Lymphedema of left arm    Nonischemic cardiomyopathy ([place])    Presence of permanent cardiac pacemaker    Preventative health care 05/18/2016   Travel sickness 05/12/2015    Unspecified constipation 04/22/2012   Vertigo    Past Surgical History:  Procedure Laterality Date   APPENDECTOMY  2009   Dr. Lucia Gaskins   BI-VENTRICULAR IMPLANTABLE CARDIOVERTER DEFIBRILLATOR N/A 11/16/2013   Procedure: BI-VENTRICULAR IMPLANTABLE CARDIOVERTER DEFIBRILLATOR  (CRT-D);  Surgeon: Evans Lance, MD;  Location: [facility] CATH LAB;  Service: Cardiovascular;  Laterality: N/A;   BI-VENTRICULAR IMPLANTABLE CARDIOVERTER DEFIBRILLATOR  (CRT-D)  11/16/13   MDT CRTD implanted by Dr Lovena Le for NICM, CHF, and LBBB   BIV ICD GENERATOR CHANGEOUT N/A 06/19/2020   Procedure: BIV ICD GENERATOR CHANGEOUT;  Surgeon: Evans Lance, MD;  Location: [facility] CV LAB;  Service: Cardiovascular;  Laterality: N/A;   BREAST BIOPSY Left 2000   BREAST LUMPECTOMY Left 2000   CARDIAC CATHETERIZATION  ~ 2010   "no blockage"   CARPAL TUNNEL RELEASE Bilateral 2007   Dr. Daylene Katayama   COLONOSCOPY     KNEE ARTHROSCOPY Right    POLYPECTOMY     TONSILLECTOMY     TRIGGER FINGER RELEASE Bilateral 2007   VAGINAL HYSTERECTOMY     Paritial   Family History  Problem Relation Age of Onset   Heart attack Mother 5   Diabetes Sister    Diabetes Brother    Coronary artery disease Other        Female first degree relative <60   Hyperlipidemia Other    Hypertension Other    Cancer Other        prostate, 1st degree relative <50   Coronary artery disease Maternal Grandmother    Colon cancer Maternal Aunt    Cancer Daughter    Allergic rhinitis Neg Hx    Angioedema Neg Hx    Asthma Neg Hx    Eczema Neg Hx    Immunodeficiency Neg Hx    Urticaria Neg Hx    Stomach cancer Neg Hx    Rectal cancer Neg Hx    Esophageal cancer Neg Hx    Liver cancer Neg Hx    Colon polyps Neg Hx    Social History   Socioeconomic History   Marital status: Widowed    Spouse name: Not on file   Number of children: 2   Years of education: 14   Highest education level: Not on file  Occupational History    Employer: DISABLED  Tobacco Use    Smoking status: Former    Packs/day: 0.10    Years: 2.00    Total pack years: 0.20    Types: Cigarettes    Quit date: 01/18/1970    Years since quitting: 51.6   Smokeless tobacco: Never   Tobacco comments:    "smoked maybe 1 pack/month when I did smoke  Vaping Use   Vaping Use: Never used  Substance and Sexual Activity   Alcohol use: No   Drug use: No   Sexual activity: Never    Birth  control/protection: Surgical, Post-menopausal  Other Topics Concern   Not on file  Social History Narrative   Widow - husband died in 2003/04/12   Occupation - retired from working in Radiation protection practitioner   2 daughter - on lives in [place], other in [place]   Remote history of tobacco but none in 40 years   Caffeine- coffee, 1 cup daily; 1 energy drink daily (V8)   Social Determinants of Health   Financial Resource Strain: Low Risk  (08/25/2020)   Overall Financial Resource Strain (CARDIA)    Difficulty of Paying Living Expenses: Not hard at all  Food Insecurity: No Food Insecurity (08/25/2020)   Hunger Vital Sign    Worried About Running Out of Food in the Last Year: Never true    [place] in the Last Year: Never true  Transportation Needs: No Transportation Needs (08/25/2020)   PRAPARE - Hydrologist (Medical): No    Lack of Transportation (Non-Medical): No  Physical Activity: Inactive (08/25/2020)   Exercise Vital Sign    Days of Exercise per Week: 0 days    Minutes of Exercise per Session: 0 min  Stress: No Stress Concern Present (08/25/2020)   [place]    Feeling of Stress : Not at all  Social Connections: Moderately Isolated (08/25/2020)   Social Connection and Isolation Panel [NHANES]    Frequency of Communication with Friends and Family: More than three times a week    Frequency of Social Gatherings with Friends and Family: More than three times a week    Attends Religious Services: More than 4 times  per year    Active Member of [employer] or Organizations: No    Attends Archivist Meetings: Never    Marital Status: Widowed    Tobacco Counseling Counseling given: Not Answered Tobacco comments: "smoked maybe 1 pack/month when I did smoke   Clinical Intake:  Pre-visit preparation completed: Yes  Pain : No/denies pain     BMI - recorded: 36.97 Nutritional Status: BMI > 30  Obese Nutritional Risks: Nausea/ vomitting/ diarrhea Diabetes: Yes CBG done?: No Did pt. bring in CBG monitor from home?: No  How often do you need to have someone help you when you read instructions, pamphlets, or other written materials from your doctor or pharmacy?: 1 - Never  Diabetic?yes Nutrition Risk Assessment:  Has the patient had any N/V/D within the last 2 months?  Yes  Does the patient have any non-healing wounds?  No  Has the patient had any unintentional weight loss or weight gain?  No   Diabetes:  Is the patient diabetic?  Yes  If diabetic, was a CBG obtained today?  No  Did the patient bring in their glucometer from home?  No  How often do you monitor your CBG's? N/A.   Financial Strains and Diabetes Management:  Are you having any financial strains with the device, your supplies or your medication? No .  Does the patient want to be seen by Chronic Care Management for management of their diabetes?  No  Would the patient like to be referred to a Nutritionist or for Diabetic Management?  No   Diabetic Exams:  Diabetic Eye Exam: Overdue for diabetic eye exam. Pt has been advised about the importance in completing this exam. Patient advised to call and schedule an eye exam. Diabetic Foot Exam: Overdue, Pt has been advised about the importance in completing this  exam. Pt is scheduled for diabetic foot exam on not yet.   Interpreter Needed?: No  Information entered by :: [place] of Daily Living    09/02/2021    1:13 PM  In your present state of health,  do you have any difficulty performing the following activities:  Hearing? 0  Vision? 0  Difficulty concentrating or making decisions? 0  Walking or climbing stairs? 0  Preparing Food and eating ? N  Using the Toilet? N  In the past six months, have you accidently leaked urine? N  Do you have problems with loss of bowel control? N  Managing your Medications? N  Managing your Finances? N  Housekeeping or managing your Housekeeping? N    Patient Care Team: Mosie Lukes, MD as PCP - General (Family Medicine) Evans Lance, MD as Consulting Physician (Cardiology) Milus Banister, MD as Consulting Physician (Gastroenterology) Ernst Bowler Gwenith Daily, MD as Consulting Physician (Allergy and Immunology) [name], MD as Consulting Physician (Oncology) Stanford Breed Denice Bors, MD as Consulting Physician (Cardiology) Melissa Montane, MD as Consulting Physician (Otolaryngology)  Indicate any recent Medical Services you may have received from other than [facility] providers in the past year (date may be approximate).     Assessment:   This is a routine wellness examination for Mylene.  Hearing/Vision screen No results found.  Dietary issues and exercise activities discussed: Current Exercise Habits: The patient does not participate in regular exercise at present, Exercise limited by: None identified   Goals Addressed             This Visit's Progress    Increase physical activity   Not on track      Depression Screen    09/02/2021    1:07 PM 08/25/2020    1:11 PM 05/22/2019   11:02 AM 05/17/2016    1:43 PM 05/12/2015    9:55 AM  PHQ 2/9 Scores  PHQ - 2 Score 0 0 0 0 0  PHQ- 9 Score   2      Fall Risk    09/02/2021    1:07 PM 08/25/2020    1:10 PM 08/13/2019    9:56 AM 05/17/2016    1:43 PM 09/29/2015    3:00 PM  [place] in the past year? 0 0 0 No No  Number falls in past yr: 0 0 0    Injury with Fall? 0 0 0    Risk for fall due to : No Fall Risks      Follow up  Falls evaluation completed Falls prevention discussed Education provided;Falls prevention discussed      FALL RISK PREVENTION PERTAINING TO THE HOME:  Any stairs in or around the home? Yes  If so, are there any without handrails? No  Home free of loose throw rugs in walkways, pet beds, electrical cords, etc? Yes  Adequate lighting in your home to reduce risk of falls? Yes   ASSISTIVE DEVICES UTILIZED TO PREVENT FALLS:  Life alert? Yes  Use of a cane, walker or w/c? No  Grab bars in the bathroom? Yes  Shower chair or bench in shower? No  Elevated toilet seat or a handicapped toilet? No   TIMED UP AND GO:  Was the test performed? Yes .  Length of time to ambulate 10 feet: 10 sec.   Gait steady and fast without use of assistive device  Cognitive Function:    05/17/2016    1:45  PM  MMSE - Mini Mental State Exam  Orientation to time 5  Orientation to Place 5  Registration 3  Attention/ Calculation 5  Recall 2  Language- name 2 objects 2  Language- repeat 1  Language- follow 3 step command 3  Language- read & follow direction 1  Write a sentence 1  Copy design 1  Total score 29        09/02/2021    1:16 PM  6CIT Screen  What Year? 0 points  What month? 0 points  What time? 0 points  Count back from 20 0 points  Months in reverse 2 points  Repeat phrase 0 points  Total Score 2 points    Immunizations Immunization History  Administered Date(s) Administered   PFIZER Comirnaty(Gray Top)Covid-19 Tri-Sucrose Vaccine 05/22/2020   PFIZER(Purple Top)SARS-COV-2 Vaccination 02/24/2019, 03/17/2019, 09/15/2019   Pfizer Covid-19 Vaccine Bivalent Booster 42yr & up 11/18/2020   Td 09/06/2006    TDAP status: Due, Education has been provided regarding the importance of this vaccine. Advised may receive this vaccine at local pharmacy or Health Dept. Aware to provide a copy of the vaccination record if obtained from local pharmacy or Health Dept. Verbalized acceptance and  understanding.  Flu Vaccine status: Declined, Education has been provided regarding the importance of this vaccine but patient still declined. Advised may receive this vaccine at local pharmacy or Health Dept. Aware to provide a copy of the vaccination record if obtained from local pharmacy or Health Dept. Verbalized acceptance and understanding.  Pneumococcal vaccine status: Declined,  Education has been provided regarding the importance of this vaccine but patient still declined. Advised may receive this vaccine at local pharmacy or Health Dept. Aware to provide a copy of the vaccination record if obtained from local pharmacy or Health Dept. Verbalized acceptance and understanding.   Covid-19 vaccine status: Completed vaccines  Qualifies for Shingles Vaccine? Yes   Zostavax completed No   Shingrix Completed?: No.    Education has been provided regarding the importance of this vaccine. Patient has been advised to call insurance company to determine out of pocket expense if they have not yet received this vaccine. Advised may also receive vaccine at local pharmacy or Health Dept. Verbalized acceptance and understanding.  Screening Tests Health Maintenance  Topic Date Due   FOOT EXAM  Never done   Zoster Vaccines- Shingrix (1 of 2) Never done   Pneumonia Vaccine 76 Years old (1 - PCV) Never done   TETANUS/TDAP  09/05/2016   URINE MICROALBUMIN  05/10/2017   OPHTHALMOLOGY EXAM  09/20/2019   HEMOGLOBIN A1C  05/21/2020   COVID-19 Vaccine (6 - Pfizer series) 03/18/2021   INFLUENZA VACCINE  08/18/2021   COLONOSCOPY (Pts 45-44yrInsurance coverage will need to be confirmed)  01/14/2022   DEXA SCAN  Completed   Hepatitis C Screening  Completed   HPV VACCINES  Aged Out    Health Maintenance  Health Maintenance Due  Topic Date Due   FOOT EXAM  Never done   Zoster Vaccines- Shingrix (1 of 2) Never done   Pneumonia Vaccine [age]Years old (1 - PCV) Never done   TETANUS/TDAP  09/05/2016    URINE MICROALBUMIN  05/10/2017   OPHTHALMOLOGY EXAM  09/20/2019   HEMOGLOBIN A1C  05/21/2020   COVID-19 Vaccine (6 - Pfizer series) 03/18/2021   INFLUENZA VACCINE  08/18/2021    Colorectal cancer screening: No longer required.   Mammogram status: Completed 11/11/20. Repeat every year  Bone Density status: Completed 11/11/20.  Results reflect: Bone density results: OSTEOPOROSIS. Repeat every 2 years.  Lung Cancer Screening: (Low Dose CT Chest recommended if Age 49-80 years, 30 pack-year currently smoking OR have quit w/in 15years.) does not qualify.   Lung Cancer Screening Referral: n/a  Additional Screening:  Hepatitis C Screening: does qualify; Completed 05/12/15  Vision Screening: Recommended annual ophthalmology exams for early detection of glaucoma and other disorders of the eye. Is the patient up to date with their annual eye exam?  No  Who is the provider or what is the name of the office in which the patient attends annual eye exams? [facility] If pt is not established with a provider, would they like to be referred to a provider to establish care? No .   Dental Screening: Recommended annual dental exams for proper oral hygiene  Community Resource Referral / Chronic Care Management: CRR required this visit?  No   CCM required this visit?  No      Plan:     I have personally reviewed and noted the following in the patient's chart:   Medical and social history Use of alcohol, tobacco or illicit drugs  Current medications and supplements including opioid prescriptions.  Functional ability and status Nutritional status Physical activity Advanced directives List of other physicians Hospitalizations, surgeries, and ER visits in previous 12 months Vitals Screenings to include cognitive, depression, and falls Referrals and appointments  In addition, I have reviewed and discussed with patient certain preventive protocols, quality metrics, and best practice  recommendations. A written personalized care plan for preventive services as well as general preventive health recommendations were provided to patient.     Duard Brady Charls [place], [place]   09/02/2021   Nurse Notes: none   I have reviewed and agree with Health Coaches documentation.  Kathlene November, MD

## 2021-09-02 NOTE — Patient Instructions (Signed)
Brenda Marsh , Thank you for taking time to come for your Medicare Wellness Visit. I appreciate your ongoing commitment to your health goals. Please review the following plan we discussed and let me know if I can assist you in the future.   Screening recommendations/referrals: Colonoscopy: no longer needed Mammogram: 11/11/20 due 11/11/20 Bone Density: 11/11/20 Recommended yearly ophthalmology/optometry visit for glaucoma screening and checkup Recommended yearly dental visit for hygiene and checkup  Vaccinations: Influenza vaccine: declined Pneumococcal vaccine: declined Tdap vaccine: declined Shingles vaccine: declined   Covid-19:completed  Advanced directives: yes, not on file  Conditions/risks identified: see problem list   Next appointment: Follow up in one year for your annual wellness visit 09/07/22   Preventive Care 81 Years and Older, Female Preventive care refers to lifestyle choices and visits with your health care provider that can promote health and wellness. What does preventive care include? A yearly physical exam. This is also called an annual well check. Dental exams once or twice a year. Routine eye exams. Ask your health care provider how often you should have your eyes checked. Personal lifestyle choices, including: Daily care of your teeth and gums. Regular physical activity. Eating a healthy diet. Avoiding tobacco and drug use. Limiting alcohol use. Practicing safe sex. Taking low-dose aspirin every day. Taking vitamin and mineral supplements as recommended by your health care provider. What happens during an annual well check? The services and screenings done by your health care provider during your annual well check will depend on your age, overall health, lifestyle risk factors, and family history of disease. Counseling  Your health care provider may ask you questions about your: Alcohol use. Tobacco use. Drug use. Emotional well-being. Home and  relationship well-being. Sexual activity. Eating habits. History of falls. Memory and ability to understand (cognition). Work and work Statistician. Reproductive health. Screening  You may have the following tests or measurements: Height, weight, and BMI. Blood pressure. Lipid and cholesterol levels. These may be checked every 5 years, or more frequently if you are over 61 years old. Skin check. Lung cancer screening. You may have this screening every year starting at age 60 if you have a 30-pack-year history of smoking and currently smoke or have quit within the past 15 years. Fecal occult blood test (FOBT) of the stool. You may have this test every year starting at age 61. Flexible sigmoidoscopy or colonoscopy. You may have a sigmoidoscopy every 5 years or a colonoscopy every 10 years starting at age 8. Hepatitis C blood test. Hepatitis B blood test. Sexually transmitted disease (STD) testing. Diabetes screening. This is done by checking your blood sugar (glucose) after you have not eaten for a while (fasting). You may have this done every 1-3 years. Bone density scan. This is done to screen for osteoporosis. You may have this done starting at age 25. Mammogram. This may be done every 1-2 years. Talk to your health care provider about how often you should have regular mammograms. Talk with your health care provider about your test results, treatment options, and if necessary, the need for more tests. Vaccines  Your health care provider may recommend certain vaccines, such as: Influenza vaccine. This is recommended every year. Tetanus, diphtheria, and acellular pertussis (Tdap, Td) vaccine. You may need a Td booster every 10 years. Zoster vaccine. You may need this after age 61. Pneumococcal 13-valent conjugate (PCV13) vaccine. One dose is recommended after age 68. Pneumococcal polysaccharide (PPSV23) vaccine. One dose is recommended after age 102. Talk to  your health care provider  about which screenings and vaccines you need and how often you need them. This information is not intended to replace advice given to you by your health care provider. Make sure you discuss any questions you have with your health care provider. Document Released: 01/31/2015 Document Revised: 09/24/2015 Document Reviewed: 11/05/2014 Elsevier Interactive Patient Education  2017 [place] Prevention in the Home Falls can cause injuries. They can happen to people of all ages. There are many things you can do to make your home safe and to help prevent falls. What can I do on the outside of my home? Regularly fix the edges of walkways and driveways and fix any cracks. Remove anything that might make you trip as you walk through a door, such as a raised step or threshold. Trim any bushes or trees on the path to your home. Use bright outdoor lighting. Clear any walking paths of anything that might make someone trip, such as rocks or tools. Regularly check to see if handrails are loose or broken. Make sure that both sides of any steps have handrails. Any raised decks and porches should have guardrails on the edges. Have any leaves, snow, or ice cleared regularly. Use sand or salt on walking paths during winter. Clean up any spills in your garage right away. This includes oil or grease spills. What can I do in the bathroom? Use night lights. Install grab bars by the toilet and in the tub and shower. Do not use towel bars as grab bars. Use non-skid mats or decals in the tub or shower. If you need to sit down in the shower, use a plastic, non-slip stool. Keep the floor dry. Clean up any water that spills on the floor as soon as it happens. Remove soap buildup in the tub or shower regularly. Attach bath mats securely with double-sided non-slip rug tape. Do not have throw rugs and other things on the floor that can make you trip. What can I do in the bedroom? Use night lights. Make sure  that you have a light by your bed that is easy to reach. Do not use any sheets or blankets that are too big for your bed. They should not hang down onto the floor. Have a firm chair that has side arms. You can use this for support while you get dressed. Do not have throw rugs and other things on the floor that can make you trip. What can I do in the kitchen? Clean up any spills right away. Avoid walking on wet floors. Keep items that you use a lot in easy-to-reach places. If you need to reach something above you, use a strong step stool that has a grab bar. Keep electrical cords out of the way. Do not use floor polish or wax that makes floors slippery. If you must use wax, use non-skid floor wax. Do not have throw rugs and other things on the floor that can make you trip. What can I do with my stairs? Do not leave any items on the stairs. Make sure that there are handrails on both sides of the stairs and use them. Fix handrails that are broken or loose. Make sure that handrails are as long as the stairways. Check any carpeting to make sure that it is firmly attached to the stairs. Fix any carpet that is loose or worn. Avoid having throw rugs at the top or bottom of the stairs. If you do have throw rugs, attach  them to the floor with carpet tape. Make sure that you have a light switch at the top of the stairs and the bottom of the stairs. If you do not have them, ask someone to add them for you. What else can I do to help prevent falls? Wear shoes that: Do not have high heels. Have rubber bottoms. Are comfortable and fit you well. Are closed at the toe. Do not wear sandals. If you use a stepladder: Make sure that it is fully opened. Do not climb a closed stepladder. Make sure that both sides of the stepladder are locked into place. Ask someone to hold it for you, if possible. Clearly mark and make sure that you can see: Any grab bars or handrails. First and last steps. Where the edge of  each step is. Use tools that help you move around (mobility aids) if they are needed. These include: Canes. Walkers. Scooters. Crutches. Turn on the lights when you go into a dark area. Replace any light bulbs as soon as they burn out. Set up your furniture so you have a clear path. Avoid moving your furniture around. If any of your floors are uneven, fix them. If there are any pets around you, be aware of where they are. Review your medicines with your doctor. Some medicines can make you feel dizzy. This can increase your chance of falling. Ask your doctor what other things that you can do to help prevent falls. This information is not intended to replace advice given to you by your health care provider. Make sure you discuss any questions you have with your health care provider. Document Released: 10/31/2008 Document Revised: 06/12/2015 Document Reviewed: 02/08/2014 Elsevier Interactive Patient Education  2017 [employer].

## 2021-09-15 ENCOUNTER — Ambulatory Visit: Payer: No Typology Code available for payment source | Admitting: Podiatry

## 2021-09-15 ENCOUNTER — Ambulatory Visit: Payer: Medicare HMO | Admitting: Podiatry

## 2021-09-15 ENCOUNTER — Ambulatory Visit (INDEPENDENT_AMBULATORY_CARE_PROVIDER_SITE_OTHER): Payer: No Typology Code available for payment source

## 2021-09-15 ENCOUNTER — Encounter: Payer: Self-pay | Admitting: Podiatry

## 2021-09-15 ENCOUNTER — Ambulatory Visit (INDEPENDENT_AMBULATORY_CARE_PROVIDER_SITE_OTHER): Payer: No Typology Code available for payment source | Admitting: Family Medicine

## 2021-09-15 VITALS — BP 142/72 | HR 82 | Temp 97.7°F | Resp 18 | Ht 65.0 in | Wt 216.0 lb

## 2021-09-15 DIAGNOSIS — M2041 Other hammer toe(s) (acquired), right foot: Secondary | ICD-10-CM | POA: Diagnosis not present

## 2021-09-15 DIAGNOSIS — M7672 Peroneal tendinitis, left leg: Secondary | ICD-10-CM | POA: Diagnosis not present

## 2021-09-15 DIAGNOSIS — Z86010 Personal history of colonic polyps: Secondary | ICD-10-CM | POA: Diagnosis not present

## 2021-09-15 DIAGNOSIS — M7662 Achilles tendinitis, left leg: Secondary | ICD-10-CM

## 2021-09-15 DIAGNOSIS — I10 Essential (primary) hypertension: Secondary | ICD-10-CM

## 2021-09-15 DIAGNOSIS — K5900 Constipation, unspecified: Secondary | ICD-10-CM | POA: Diagnosis not present

## 2021-09-15 DIAGNOSIS — E782 Mixed hyperlipidemia: Secondary | ICD-10-CM | POA: Diagnosis not present

## 2021-09-15 DIAGNOSIS — M778 Other enthesopathies, not elsewhere classified: Secondary | ICD-10-CM | POA: Diagnosis not present

## 2021-09-15 DIAGNOSIS — Z853 Personal history of malignant neoplasm of breast: Secondary | ICD-10-CM

## 2021-09-15 DIAGNOSIS — N63 Unspecified lump in unspecified breast: Secondary | ICD-10-CM | POA: Diagnosis not present

## 2021-09-15 DIAGNOSIS — E119 Type 2 diabetes mellitus without complications: Secondary | ICD-10-CM

## 2021-09-15 DIAGNOSIS — I5042 Chronic combined systolic (congestive) and diastolic (congestive) heart failure: Secondary | ICD-10-CM | POA: Diagnosis not present

## 2021-09-15 MED ORDER — DEXAMETHASONE SODIUM PHOSPHATE 120 MG/30ML IJ SOLN
2.0000 mg | Freq: Once | INTRAMUSCULAR | Status: AC
  Administered 2021-09-15: 2 mg via INTRA_ARTICULAR

## 2021-09-15 NOTE — Assessment & Plan Note (Signed)
Encouraged Pure or MIND diet, decrease po intake and increase exercise as tolerated. Needs 7-8 hours of sleep nightly. Avoid trans fats, eat small, frequent meals every 4-5 hours with lean proteins, complex carbs and healthy fats. Minimize simple carbs, high fat foods and processed foods.

## 2021-09-15 NOTE — Progress Notes (Signed)
Subjective:   By signing my name below, I, Kellie Simmering, attest that this documentation has been prepared under the direction and in the presence of Mosie Lukes, MD 09/15/2021.     Patient ID: Brenda Marsh, female    DOB: 01/31/1945, 76 y.o.   MRN: [id number]  No chief complaint on file.  HPI Patient is in today for an office visit.  Mammogram: She is requesting a new order for a mammogram as she had received a COVID-19 immunization before her mammogram last year, leading to complications.   Podiatry: She saw her podiatrist, Dr. Milinda Pointer, today who injected 2 mg of Dexamethasone to manage her peroneal tendinitis and achilles tendinitis.   Oncology: She is currently seeing her oncologist, Dr. Marin Olp, due to her history of breast cancer.   Weight: Her weight has been relatively the same across her last 3 visits. She shows no interest in taking weight loss medications at this time.  Wt Readings from Last 3 Encounters:  09/15/21 216 lb (98 kg)  09/02/21 215 lb 6.4 oz (97.7 kg)  06/16/21 217 lb (98.4 kg)   Bowel movements: She complains of irregularities with bowel movements. She drinks prune juice every morning to manage this. She denies blood in her stool. She is okay with receiving a gastroenterologist referral.   Vertigo: She states that she experiences vertigo once every 2-3 months. She reports that Meclizine 12.5 mg is effective at managing this. She describes having a feeling that something is moving in her ear.   Blood sugar: She does not check her blood sugar regularly. Lab Results  Component Value Date   HGBA1C 5.5 11/22/2019   Immunizations: She has been informed about receiving COVID-19, high-dose Flu, and RSV immunizations.   Past Surgical History:  Procedure Laterality Date   APPENDECTOMY  2009   Dr. Lucia Gaskins   BI-VENTRICULAR IMPLANTABLE CARDIOVERTER DEFIBRILLATOR N/A 11/16/2013   Procedure: BI-VENTRICULAR IMPLANTABLE CARDIOVERTER DEFIBRILLATOR  (CRT-D);   Surgeon: Evans Lance, MD;  Location: [facility] CATH LAB;  Service: Cardiovascular;  Laterality: N/A;   BI-VENTRICULAR IMPLANTABLE CARDIOVERTER DEFIBRILLATOR  (CRT-D)  11/16/13   MDT CRTD implanted by Dr Lovena Le for NICM, CHF, and LBBB   BIV ICD GENERATOR CHANGEOUT N/A 06/19/2020   Procedure: BIV ICD GENERATOR CHANGEOUT;  Surgeon: Evans Lance, MD;  Location: [place] CV LAB;  Service: Cardiovascular;  Laterality: N/A;   BREAST BIOPSY Left 2000   BREAST LUMPECTOMY Left 2000   CARDIAC CATHETERIZATION  ~ 2010   "no blockage"   CARPAL TUNNEL RELEASE Bilateral 2007   Dr. Daylene Katayama   COLONOSCOPY     KNEE ARTHROSCOPY Right    POLYPECTOMY     TONSILLECTOMY     TRIGGER FINGER RELEASE Bilateral 2007   VAGINAL HYSTERECTOMY     Paritial   Family History  Problem Relation Age of Onset   Heart attack Mother 53   Diabetes Sister    Diabetes Brother    Coronary artery disease Other        Female first degree relative <60   Hyperlipidemia Other    Hypertension Other    Cancer Other        prostate, 1st degree relative <50   Coronary artery disease Maternal Grandmother    Colon cancer Maternal Aunt    Cancer Daughter    Allergic rhinitis Neg Hx    Angioedema Neg Hx    Asthma Neg Hx    Eczema Neg Hx    Immunodeficiency Neg Hx  Urticaria Neg Hx    Stomach cancer Neg Hx    Rectal cancer Neg Hx    Esophageal cancer Neg Hx    Liver cancer Neg Hx    Colon polyps Neg Hx    Social History   Socioeconomic History   Marital status: Widowed    Spouse name: Not on file   Number of children: 2   Years of education: 14   Highest education level: Not on file  Occupational History    Employer: DISABLED  Tobacco Use   Smoking status: Former    Packs/day: 0.10    Years: 2.00    Total pack years: 0.20    Types: Cigarettes    Quit date: 01/18/1970    Years since quitting: 51.6   Smokeless tobacco: Never   Tobacco comments:    "smoked maybe 1 pack/month when I did smoke  Vaping Use   Vaping  Use: Never used  Substance and Sexual Activity   Alcohol use: No   Drug use: No   Sexual activity: Never    Birth control/protection: Surgical, Post-menopausal  Other Topics Concern   Not on file  Social History Narrative   Widow - husband died in 2003/06/03   Occupation - retired from working in Museum/gallery curator   2 daughter - on lives in [place], other in [place]   Remote history of tobacco but none in 40 years   Caffeine- coffee, 1 cup daily; 1 energy drink daily (V8)   Social Determinants of Health   Financial Resource Strain: Low Risk  (08/25/2020)   Overall Financial Resource Strain (CARDIA)    Difficulty of Paying Living Expenses: Not hard at all  Food Insecurity: No Food Insecurity (08/25/2020)   Hunger Vital Sign    Worried About Running Out of Food in the Last Year: Never true    Ran Out of Food in the Last Year: Never true  Transportation Needs: No Transportation Needs (08/25/2020)   PRAPARE - Administrator, Civil Service (Medical): No    Lack of Transportation (Non-Medical): No  Physical Activity: Inactive (08/25/2020)   Exercise Vital Sign    Days of Exercise per Week: 0 days    Minutes of Exercise per Session: 0 min  Stress: No Stress Concern Present (08/25/2020)   [employer] of Occupational Health - Occupational Stress Questionnaire    Feeling of Stress : Not at all  Social Connections: Moderately Isolated (08/25/2020)   Social Connection and Isolation Panel [NHANES]    Frequency of Communication with Friends and Family: More than three times a week    Frequency of Social Gatherings with Friends and Family: More than three times a week    Attends Religious Services: More than 4 times per year    Active Member of [employer] or Organizations: No    Attends Banker Meetings: Never    Marital Status: Widowed  Intimate Partner Violence: Not At Risk (08/25/2020)   Humiliation, Afraid, Rape, and Kick questionnaire    Fear of Current or Ex-Partner: No     Emotionally Abused: No    Physically Abused: No    Sexually Abused: No   Outpatient Medications Prior to Visit  Medication Sig Dispense Refill   albuterol (VENTOLIN HFA) 108 (90 Base) MCG/ACT inhaler Inhale 2 puffs into the lungs every 4 (four) hours as needed for wheezing or shortness of breath.     Ascorbic Acid (VITAMIN C PO) Take 1 tablet by mouth every evening.  bisoprolol (ZEBETA) 5 MG tablet TAKE 1/2 TABLET (2.5 MG TOTAL) BY MOUTH DAILY 45 tablet 3   Cholecalciferol (VITAMIN D3) 50 MCG (2000 UT) TABS Take 6,000 Units by mouth every evening.     COVID-19 mRNA bivalent vaccine, Pfizer, (PFIZER COVID-19 VAC BIVALENT) injection Inject into the muscle. 0.3 mL 0   doxycycline (VIBRA-TABS) 100 MG tablet Take 1 tablet (100 mg total) by mouth 2 (two) times daily. 14 tablet 0   meclizine (ANTIVERT) 12.5 MG tablet Take 1 tablet (12.5 mg total) by mouth 3 (three) times daily as needed for dizziness. 90 tablet 3   Multiple Vitamin (MULTIVITAMIN WITH MINERALS) TABS tablet Take 1 tablet by mouth every evening.     spironolactone (ALDACTONE) 25 MG tablet Take 0.5 tablets (12.5 mg total) by mouth daily. 45 tablet 3   vitamin C (ASCORBIC ACID) 500 MG tablet Take 500 mg by mouth every evening.     Vitamin E 670 MG (1000 UT) CAPS Take 1,000 Units by mouth 2 (two) times a week. In the evening     Zinc 50 MG TABS Take 50 mg by mouth every other day. In the evening     No facility-administered medications prior to visit.   Allergies  Allergen Reactions   Atorvastatin Other (See Comments)    Myalgia, weakness   Naproxen Sodium Shortness Of Breath    Difficulty breathing   Shellfish Allergy Shortness Of Breath    Cant breath    Contrast Media [Iodinated Contrast Media] Nausea And Vomiting   Crestor [Rosuvastatin] Other (See Comments)    Myalgia, weakness   Metrizamide Nausea And Vomiting   Lisinopril Swelling and Cough    Difficulty breathing   Penicillins Rash    Rash on arms   Simvastatin  Other (See Comments)    Myalgia, weakness   Review of Systems  Gastrointestinal:  Positive for constipation. Negative for blood in stool.  Neurological:  Negative for dizziness.      Objective:    Physical Exam Constitutional:      General: She is not in acute distress.    Appearance: Normal appearance. She is not ill-appearing.  HENT:     Head: Normocephalic and atraumatic.     Right Ear: External ear normal.     Left Ear: External ear normal.     Mouth/Throat:     Mouth: Mucous membranes are moist.     Pharynx: Oropharynx is clear.  Eyes:     Extraocular Movements: Extraocular movements intact.     Pupils: Pupils are equal, round, and reactive to light.  Cardiovascular:     Rate and Rhythm: Normal rate and regular rhythm.     Pulses: Normal pulses.     Heart sounds: Normal heart sounds. No murmur heard.    No gallop.  Pulmonary:     Effort: Pulmonary effort is normal. No respiratory distress.     Breath sounds: Normal breath sounds. No wheezing or rales.  Abdominal:     General: Bowel sounds are normal.  Skin:    General: Skin is warm and dry.  Neurological:     Mental Status: She is alert and oriented to person, place, and time.  Psychiatric:        Mood and Affect: Mood normal.        Behavior: Behavior normal.        Judgment: Judgment normal.    There were no vitals taken for this visit. Wt Readings from Last 3 Encounters:  09/02/21  215 lb 6.4 oz (97.7 kg)  06/16/21 217 lb (98.4 kg)  04/22/21 216 lb (98 kg)   Diabetic Foot Exam - Simple   No data filed    Lab Results  Component Value Date   WBC 6.4 10/29/2020   HGB 12.5 10/29/2020   HCT 37.3 10/29/2020   PLT 228 10/29/2020   GLUCOSE 82 10/29/2020   CHOL 288 (H) 11/22/2019   TRIG 277 (H) 11/22/2019   HDL 43 (L) 11/22/2019   LDLCALC 196 (H) 11/22/2019   ALT 18 10/29/2020   AST 22 10/29/2020   NA 139 10/29/2020   K 3.8 10/29/2020   CL 104 10/29/2020   CREATININE 0.90 10/29/2020   BUN 11  10/29/2020   CO2 25 10/29/2020   TSH 0.79 11/22/2019   HGBA1C 5.5 11/22/2019   MICROALBUR <0.7 05/10/2016   Lab Results  Component Value Date   TSH 0.79 11/22/2019   Lab Results  Component Value Date   WBC 6.4 10/29/2020   HGB 12.5 10/29/2020   HCT 37.3 10/29/2020   MCV 92.8 10/29/2020   PLT 228 10/29/2020   Lab Results  Component Value Date   NA 139 10/29/2020   K 3.8 10/29/2020   CO2 25 10/29/2020   GLUCOSE 82 10/29/2020   BUN 11 10/29/2020   CREATININE 0.90 10/29/2020   BILITOT 0.4 10/29/2020   ALKPHOS 67 10/29/2020   AST 22 10/29/2020   ALT 18 10/29/2020   PROT 7.7 10/29/2020   ALBUMIN 4.3 10/29/2020   CALCIUM 10.7 (H) 10/29/2020   ANIONGAP 10 10/29/2020   EGFR 64 06/10/2020   GFR 81.34 05/22/2019   Lab Results  Component Value Date   CHOL 288 (H) 11/22/2019   Lab Results  Component Value Date   HDL 43 (L) 11/22/2019   Lab Results  Component Value Date   LDLCALC 196 (H) 11/22/2019   Lab Results  Component Value Date   TRIG 277 (H) 11/22/2019   Lab Results  Component Value Date   CHOLHDL 6.7 (H) 11/22/2019   Lab Results  Component Value Date   HGBA1C 5.5 11/22/2019     Assessment & Plan:   Problem List Items Addressed This Visit   None  No orders of the defined types were placed in this encounter.  I, Kellie Simmering, personally preformed the services described in this documentation.  All medical record entries made by the scribe were at my direction and in my presence.  I have reviewed the chart and discharge instructions (if applicable) and agree that the record reflects my personal performance and is accurate and complete. 09/15/2021  I,Mohammed Iqbal,acting as a scribe for [name], MD.,have documented all relevant documentation on the behalf of [name], MD,as directed by  [name], MD while in the presence of [name], MD.  Kellie Simmering

## 2021-09-15 NOTE — Assessment & Plan Note (Signed)
>>  ASSESSMENT AND PLAN FOR MORB[facility]ESITY (HCC) WRITTEN ON 09/15/2021  3:57 PM BY BLYTH, STACEY A, MD  Encouraged Pure or MIND diet, decrease po intake and increase exercise as tolerated. Needs 7-8 hours of sleep nightly. Avoid trans fats, eat small, frequent meals every 4-5 hours with lean proteins, complex carbs and healthy fats. Minimize simple carbs, high fat foods and processed foods.

## 2021-09-15 NOTE — Patient Instructions (Addendum)
Yerba Matte tea do not drink  [employer] shoes for [employer]  Encouraged increased hydration and fiber in diet. Daily probiotics. If bowels not moving can use MilkOfMagnesium 2 tbls po in 4 oz of warm prune juice by mouth every 2-3 days. If no results then repeat in 4 hours with  Dulcolax suppository pr, may repeat again in 4 more hours as needed. Seek care if symptoms worsen. Consider daily Miralax and/or Dulcolax if symptoms persist.    RSV (Respiratory Syncitial Virus) vaccine once at the [place] booster late September or early October   Shingrix is the new shingles shot, 2 shots over 2-6 months, confirm coverage with insurance and document, then can return here for shots with nurse appt or at pharmacy

## 2021-09-15 NOTE — Assessment & Plan Note (Signed)
Well controlled, no changes to meds. Encouraged heart healthy diet such as the DASH diet and exercise as tolerated.  °

## 2021-09-15 NOTE — Progress Notes (Signed)
Subjective:  Patient ID: Brenda Marsh, female    DOB: [birth year],  MRN: [id number] HPI Chief Complaint  Patient presents with   Ankle Pain    Lateral ankle left - swelling, aching x 3 weeks, no injury, couldn't even bear weight one day, no treatment   Toe Pain    2nd toe right - hammertoe deformity x years, worsening, rubbing shoes   New Patient (Initial Visit)    76 y.o. female presents with the above complaint.   ROS: Denies fever chills nausea vomiting muscle aches pains calf pain back pain chest pain shortness of breath.  Past Medical History:  Diagnosis Date   AICD (automatic cardioverter/defibrillator) present    Allergic state 05/17/2016   Dr Ernst Bowler asthma and allergy specialist.   Allergy    Arthritis    left knee pain, MRI in 2008-tricompartmental  degenerate changes, mucoid degeneration of ACL, post horn meniscal tear and ant horn meniscal tear   Asthma    Breast cancer in female [facility]) 2000   left; S/P lumpectomy, radiation and chemotherapy   CHF (congestive heart failure) ([place])    Colon polyp    unclear pathology   Hyperlipidemia    Hypertension    LBBB (left bundle branch block)    Low back pain    left L3-4 injected   Lymphedema of left arm    Nonischemic cardiomyopathy ([place])    Presence of permanent cardiac pacemaker    Preventative health care 05/18/2016   Travel sickness 05/12/2015   Unspecified constipation 04/22/2012   Vertigo    Past Surgical History:  Procedure Laterality Date   APPENDECTOMY  2009   Dr. Lucia Gaskins   BI-VENTRICULAR IMPLANTABLE CARDIOVERTER DEFIBRILLATOR N/A 11/16/2013   Procedure: BI-VENTRICULAR IMPLANTABLE CARDIOVERTER DEFIBRILLATOR  (CRT-D);  Surgeon: Evans Lance, MD;  Location: [facility] CATH LAB;  Service: Cardiovascular;  Laterality: N/A;   BI-VENTRICULAR IMPLANTABLE CARDIOVERTER DEFIBRILLATOR  (CRT-D)  11/16/13   MDT CRTD implanted by Dr Lovena Le for NICM, CHF, and LBBB   BIV ICD GENERATOR CHANGEOUT N/A 06/19/2020   Procedure: BIV  ICD GENERATOR CHANGEOUT;  Surgeon: Evans Lance, MD;  Location: [place] CV LAB;  Service: Cardiovascular;  Laterality: N/A;   BREAST BIOPSY Left 2000   BREAST LUMPECTOMY Left 2000   CARDIAC CATHETERIZATION  ~ 2010   "no blockage"   CARPAL TUNNEL RELEASE Bilateral 2007   Dr. Daylene Katayama   COLONOSCOPY     KNEE ARTHROSCOPY Right    POLYPECTOMY     TONSILLECTOMY     TRIGGER FINGER RELEASE Bilateral 2007   VAGINAL HYSTERECTOMY     Paritial    Current Outpatient Medications:    albuterol (VENTOLIN HFA) 108 (90 Base) MCG/ACT inhaler, Inhale 2 puffs into the lungs every 4 (four) hours as needed for wheezing or shortness of breath., Disp: , Rfl:    Ascorbic Acid (VITAMIN C PO), Take 1 tablet by mouth every evening., Disp: , Rfl:    bisoprolol (ZEBETA) 5 MG tablet, TAKE 1/2 TABLET (2.5 MG TOTAL) BY MOUTH DAILY, Disp: 45 tablet, Rfl: 3   Cholecalciferol (VITAMIN D3) 50 MCG (2000 UT) TABS, Take 6,000 Units by mouth every evening., Disp: , Rfl:    COVID-19 mRNA bivalent vaccine, Pfizer, (PFIZER COVID-19 VAC BIVALENT) injection, Inject into the muscle., Disp: 0.3 mL, Rfl: 0   doxycycline (VIBRA-TABS) 100 MG tablet, Take 1 tablet (100 mg total) by mouth 2 (two) times daily., Disp: 14 tablet, Rfl: 0   meclizine (ANTIVERT) 12.5 MG tablet, Take  1 tablet (12.5 mg total) by mouth 3 (three) times daily as needed for dizziness., Disp: 90 tablet, Rfl: 3   Multiple Vitamin (MULTIVITAMIN WITH MINERALS) TABS tablet, Take 1 tablet by mouth every evening., Disp: , Rfl:    spironolactone (ALDACTONE) 25 MG tablet, Take 0.5 tablets (12.5 mg total) by mouth daily., Disp: 45 tablet, Rfl: 3   vitamin C (ASCORBIC ACID) 500 MG tablet, Take 500 mg by mouth every evening., Disp: , Rfl:    Vitamin E 670 MG (1000 UT) CAPS, Take 1,000 Units by mouth 2 (two) times a week. In the evening, Disp: , Rfl:    Zinc 50 MG TABS, Take 50 mg by mouth every other day. In the evening, Disp: , Rfl:   Allergies  Allergen Reactions    Atorvastatin Other (See Comments)    Myalgia, weakness   Naproxen Sodium Shortness Of Breath    Difficulty breathing   Shellfish Allergy Shortness Of Breath    Cant breath    Contrast Media [Iodinated Contrast Media] Nausea And Vomiting   Crestor [Rosuvastatin] Other (See Comments)    Myalgia, weakness   Metrizamide Nausea And Vomiting   Lisinopril Swelling and Cough    Difficulty breathing   Penicillins Rash    Rash on arms   Simvastatin Other (See Comments)    Myalgia, weakness   Review of Systems Objective:  There were no vitals filed for this visit.  General: Well developed, nourished, in no acute distress, alert and oriented x3   Dermatological: Skin is warm, dry and supple bilateral. Nails x 10 are well maintained; remaining integument appears unremarkable at this time. There are no open sores, no preulcerative lesions, no rash or signs of infection present.  Vascular: Dorsalis Pedis artery and Posterior Tibial artery pedal pulses are 2/4 bilateral with immedate capillary fill time. Pedal hair growth present. No varicosities and no lower extremity edema present bilateral.   Neruologic: Grossly intact via light touch bilateral. Vibratory intact via tuning fork bilateral. Protective threshold with Semmes Wienstein monofilament intact to all pedal sites bilateral. Patellar and Achilles deep tendon reflexes 2+ bilateral. No Babinski or clonus noted bilateral.   Musculoskeletal: No gross boney pedal deformities bilateral. No pain, crepitus, or limitation noted with foot and ankle range of motion bilateral. Muscular strength 5/5 in all groups tested bilateral.  She has tenderness on palpation of the second toe PIPJ it appears to be slightly to moderately arthritic.  She also has tenderness on palpation of the peroneal tendons posteriorly superiorly at the myotendinous junction she also has tenderness of the Achilles in the similar area.  Radiographs did show tears to the Achilles  tendons with a history of calcification within those at some point.  Gait: Unassisted, Nonantalgic.    Radiographs:  Radiographs taken today demonstrate an osseously mature individual.  Mild pes planus bilateral hallux valgus is noted bilaterally.  Hammertoe deformity second digit right foot with osteoarthritic changes at the PIPJ.  Posterior compartment does demonstrate thickening of the Achilles tendon appears to be watershed area mid substance with a history of probable tears and calcification within the tendon itself.  Assessment & Plan:   Assessment: Hammertoe deformity second digit right with capsulitis.  Osteoarthritis second digit right.  Hallux valgus bilateral.  Peroneal tendinitis Achilles tendinitis left.  Plan: Discussed etiology pathology conservative versus surgical therapies at this point she does not want surgery to correct the hammertoe.  We did discuss the possible need for this.  I injected the fat  pad area between the peroneals and the Achilles with 2 mg of dexamethasone to help alleviate her symptoms.  Also placed her in a cam walker.  Also suggested the use of Voltaren gel.     Issiah Huffaker T. [place], [place]

## 2021-09-15 NOTE — Assessment & Plan Note (Signed)
Encourage heart healthy diet such as MIND or DASH diet, increase exercise, avoid trans fats, simple carbohydrates and processed foods, consider a krill or fish or flaxseed oil cap daily.  °

## 2021-09-16 LAB — CBC
HCT: 38.7 % (ref 36.0–46.0)
Hemoglobin: 12.9 g/dL (ref 12.0–15.0)
MCHC: 33.4 g/dL (ref 30.0–36.0)
MCV: 95.4 fl (ref 78.0–100.0)
Platelets: 221 10*3/uL (ref 150.0–400.0)
RBC: 4.06 Mil/uL (ref 3.87–5.11)
RDW: 14.2 % (ref 11.5–15.5)
WBC: 6.3 10*3/uL (ref 4.0–10.5)

## 2021-09-16 LAB — COMPREHENSIVE METABOLIC PANEL
ALT: 21 U/L (ref 0–35)
AST: 24 U/L (ref 0–37)
Albumin: 4.3 g/dL (ref 3.5–5.2)
Alkaline Phosphatase: 84 U/L (ref 39–117)
BUN: 9 mg/dL (ref 6–23)
CO2: 23 mEq/L (ref 19–32)
Calcium: 9.8 mg/dL (ref 8.4–10.5)
Chloride: 105 mEq/L (ref 96–112)
Creatinine, Ser: 0.82 mg/dL (ref 0.40–1.20)
GFR: 69.59 mL/min (ref >60.00)
Glucose, Bld: 119 mg/dL — ABNORMAL HIGH (ref 70–99)
Potassium: 4 mEq/L (ref 3.5–5.1)
Sodium: 139 mEq/L (ref 135–145)
Total Bilirubin: 0.4 mg/dL (ref 0.2–1.2)
Total Protein: 8 g/dL (ref 6.0–8.3)

## 2021-09-16 LAB — HEMOGLOBIN A1C: Hgb A1c MFr Bld: 6 % (ref 4.6–6.5)

## 2021-09-16 LAB — LIPID PANEL
Cholesterol: 309 mg/dL — ABNORMAL HIGH (ref 0–200)
HDL: 48.2 mg/dL (ref >39.00)
LDL Cholesterol: 233 mg/dL — ABNORMAL HIGH (ref 0–99)
NonHDL: 261.14
Total CHOL/HDL Ratio: 6
Triglycerides: 141 mg/dL (ref 0.0–149.0)
VLDL: 28.2 mg/dL (ref 0.0–40.0)

## 2021-09-16 LAB — TSH: TSH: 0.49 u[IU]/mL (ref 0.35–5.50)

## 2021-09-16 NOTE — Assessment & Plan Note (Signed)
No recent exacerbation. No changes 

## 2021-09-16 NOTE — Assessment & Plan Note (Signed)
hgba1c acceptable, minimize simple carbs. Increase exercise as tolerated. Continue current meds 

## 2021-09-17 ENCOUNTER — Ambulatory Visit (INDEPENDENT_AMBULATORY_CARE_PROVIDER_SITE_OTHER): Payer: No Typology Code available for payment source

## 2021-09-17 DIAGNOSIS — I428 Other cardiomyopathies: Secondary | ICD-10-CM | POA: Diagnosis not present

## 2021-09-18 ENCOUNTER — Other Ambulatory Visit: Payer: Self-pay

## 2021-09-18 DIAGNOSIS — E782 Mixed hyperlipidemia: Secondary | ICD-10-CM

## 2021-09-21 LAB — CUP PACEART REMOTE DEVICE CHECK
Battery Remaining Longevity: 73 mo
Battery Voltage: 2.99 V
Brady Statistic AP VP Percent: 0.35 %
Brady Statistic AP VS Percent: 0.01 %
Brady Statistic AS VP Percent: 95.78 %
Brady Statistic AS VS Percent: 3.85 %
Brady Statistic RA Percent Paced: 0.37 %
Brady Statistic RV Percent Paced: 85.41 %
Date Time Interrogation Session: 20230831031807
HighPow Impedance: 72 Ohm
Implantable Lead Implant Date: 20151030
Implantable Lead Implant Date: 20151030
Implantable Lead Implant Date: 20151030
Implantable Lead Location: 753858
Implantable Lead Location: 753859
Implantable Lead Location: 753860
Implantable Lead Model: 4598
Implantable Lead Model: 5076
Implantable Lead Model: 6935
Implantable Pulse Generator Implant Date: 20220602
Lead Channel Impedance Value: 1140 Ohm
Lead Channel Impedance Value: 1311 Ohm
Lead Channel Impedance Value: 1349 Ohm
Lead Channel Impedance Value: 240.906
Lead Channel Impedance Value: 246.655
Lead Channel Impedance Value: 268.078
Lead Channel Impedance Value: 285 Ohm
Lead Channel Impedance Value: 348.587
Lead Channel Impedance Value: 360.753
Lead Channel Impedance Value: 361 Ohm
Lead Channel Impedance Value: 399 Ohm
Lead Channel Impedance Value: 456 Ohm
Lead Channel Impedance Value: 608 Ohm
Lead Channel Impedance Value: 646 Ohm
Lead Channel Impedance Value: 817 Ohm
Lead Channel Impedance Value: 893 Ohm
Lead Channel Impedance Value: 950 Ohm
Lead Channel Impedance Value: 950 Ohm
Lead Channel Pacing Threshold Amplitude: 0.75 V
Lead Channel Pacing Threshold Amplitude: 0.875 V
Lead Channel Pacing Threshold Amplitude: 1.75 V
Lead Channel Pacing Threshold Pulse Width: 0.4 ms
Lead Channel Pacing Threshold Pulse Width: 0.4 ms
Lead Channel Pacing Threshold Pulse Width: 0.8 ms
Lead Channel Sensing Intrinsic Amplitude: 11.625 mV
Lead Channel Sensing Intrinsic Amplitude: 11.625 mV
Lead Channel Sensing Intrinsic Amplitude: 4 mV
Lead Channel Sensing Intrinsic Amplitude: 4 mV
Lead Channel Setting Pacing Amplitude: 2 V
Lead Channel Setting Pacing Amplitude: 2 V
Lead Channel Setting Pacing Amplitude: 2.5 V
Lead Channel Setting Pacing Pulse Width: 0.3 ms
Lead Channel Setting Pacing Pulse Width: 0.8 ms
Lead Channel Setting Sensing Sensitivity: 0.3 mV

## 2021-10-01 ENCOUNTER — Telehealth: Payer: Self-pay | Admitting: Family Medicine

## 2021-10-01 NOTE — Telephone Encounter (Signed)
Lilia Pro from the Breast imaging center called to make PCP aware that they had tried to contact patient for the referral that was sent to them regarding a diagnostic mammogram. Patient had called them back and told them she was fine to which the breast center told her if nothing was wrong then a screening mammogram was all she needed. Lilia Pro stated she was trying to explain to the patient the difference between the mammograms and how she could come to the Med center [facility] imaging for a screening mammogram, but the patient got frustrated and was not happy and ended up hanging up in them. Lilia Pro can be reached at [phone] ext. [phone] for further questions. Please advise.

## 2021-10-08 ENCOUNTER — Other Ambulatory Visit: Payer: Self-pay | Admitting: Family Medicine

## 2021-10-08 DIAGNOSIS — Z1231 Encounter for screening mammogram for malignant neoplasm of breast: Secondary | ICD-10-CM

## 2021-10-08 NOTE — Telephone Encounter (Signed)
Left vm to return call.    

## 2021-10-08 NOTE — Telephone Encounter (Signed)
Pt agrees to get the Diagnostic Mammogram, she does not want to go to the same center because the staff was rude.   Are there any other Breast Centers that she can have the Diag imaging done at?

## 2021-10-08 NOTE — Progress Notes (Signed)
Remote ICD transmission.   

## 2021-10-09 NOTE — Telephone Encounter (Signed)
Notified pt and she is agreeable to go to [facility].  Order printed and faxed. Pt not due until 11/12/21 and she will call them to schedule appt closer to that date.

## 2021-10-22 ENCOUNTER — Encounter: Payer: Self-pay | Admitting: Nurse Practitioner

## 2021-10-22 ENCOUNTER — Ambulatory Visit (INDEPENDENT_AMBULATORY_CARE_PROVIDER_SITE_OTHER): Payer: No Typology Code available for payment source | Admitting: Nurse Practitioner

## 2021-10-22 VITALS — BP 130/68 | HR 78 | Ht 64.0 in | Wt 216.0 lb

## 2021-10-22 DIAGNOSIS — E261 Secondary hyperaldosteronism: Secondary | ICD-10-CM | POA: Diagnosis not present

## 2021-10-22 DIAGNOSIS — Z86010 Personal history of colonic polyps: Secondary | ICD-10-CM

## 2021-10-22 DIAGNOSIS — I700 Atherosclerosis of aorta: Secondary | ICD-10-CM | POA: Diagnosis not present

## 2021-10-22 DIAGNOSIS — Z853 Personal history of malignant neoplasm of breast: Secondary | ICD-10-CM | POA: Diagnosis not present

## 2021-10-22 DIAGNOSIS — Z008 Encounter for other general examination: Secondary | ICD-10-CM | POA: Diagnosis not present

## 2021-10-22 DIAGNOSIS — T466X5D Adverse effect of antihyperlipidemic and antiarteriosclerotic drugs, subsequent encounter: Secondary | ICD-10-CM | POA: Diagnosis not present

## 2021-10-22 DIAGNOSIS — I130 Hypertensive heart and chronic kidney disease with heart failure and stage 1 through stage 4 chronic kidney disease, or unspecified chronic kidney disease: Secondary | ICD-10-CM | POA: Diagnosis not present

## 2021-10-22 DIAGNOSIS — R2681 Unsteadiness on feet: Secondary | ICD-10-CM | POA: Diagnosis not present

## 2021-10-22 DIAGNOSIS — Z950 Presence of cardiac pacemaker: Secondary | ICD-10-CM | POA: Diagnosis not present

## 2021-10-22 DIAGNOSIS — N182 Chronic kidney disease, stage 2 (mild): Secondary | ICD-10-CM | POA: Diagnosis not present

## 2021-10-22 DIAGNOSIS — E1122 Type 2 diabetes mellitus with diabetic chronic kidney disease: Secondary | ICD-10-CM | POA: Diagnosis not present

## 2021-10-22 DIAGNOSIS — E1169 Type 2 diabetes mellitus with other specified complication: Secondary | ICD-10-CM | POA: Diagnosis not present

## 2021-10-22 DIAGNOSIS — E785 Hyperlipidemia, unspecified: Secondary | ICD-10-CM | POA: Diagnosis not present

## 2021-10-22 DIAGNOSIS — I509 Heart failure, unspecified: Secondary | ICD-10-CM | POA: Diagnosis not present

## 2021-10-22 DIAGNOSIS — Z6836 Body mass index (BMI) 36.0-36.9, adult: Secondary | ICD-10-CM | POA: Diagnosis not present

## 2021-10-22 NOTE — Progress Notes (Deleted)
Chief Complaint:  ***   Assessment & Plan      HPI:    Brenda Marsh is a 76 y.o. year old female known to Dr. Ardis Hughs with a past medical history of colon polyps, breast cancer, appendectomy,  AICD placement. .  See PMH / [place] for additional history   No changes in [facility] of colon cancer. She has no blood in stool or blood changes.   Interval history:   Previous Labs / Imaging::    Latest Ref Rng & Units 09/15/2021    4:02 PM 10/29/2020   11:27 AM 06/10/2020    9:55 AM  CBC  WBC 4.0 - 10.5 K/uL 6.3  6.4  7.0   Hemoglobin 12.0 - 15.0 g/dL 12.9  12.5  12.9   Hematocrit 36.0 - 46.0 % 38.7  37.3  38.4   Platelets 150.0 - 400.0 K/uL 221.0  228  197     Lab Results  Component Value Date   LIPASE 113 08/31/2009      Latest Ref Rng & Units 09/15/2021    4:02 PM 10/29/2020   11:27 AM 06/10/2020    9:55 AM  CMP  Glucose 70 - 99 mg/dL 119  82  84   BUN 6 - 23 mg/dL '9  11  10   '$ Creatinine 0.40 - 1.20 mg/dL 0.82  0.90  0.93   Sodium 135 - 145 mEq/L 139  139  140   Potassium 3.5 - 5.1 mEq/L 4.0  3.8  4.4   Chloride 96 - 112 mEq/L 105  104  103   CO2 19 - 32 mEq/L '23  25  23   '$ Calcium 8.4 - 10.5 mg/dL 9.8  10.7  10.1   Total Protein 6.0 - 8.3 g/dL 8.0  7.7    Total Bilirubin 0.2 - 1.2 mg/dL 0.4  0.4    Alkaline Phos 39 - 117 U/L 84  67    AST 0 - 37 U/L 24  22    ALT 0 - 35 U/L 21  18        Previous GI Evaluation  Dec 2018 colonoscopy -Three 2 to 4 mm polyps in the rectum, in the descending colon and in the ascending colon, removed with a cold snare. Resected and retrieved. - Diverticulosis - The examination was otherwise normal on direct and retroflexion views Surgical [P], rectum, descending and ascending, polyp (3) - TUBULAR ADENOMAS (TWO). - SEPARATE FRAGMENT OF BENIGN POLYPOID COLORECTAL MUCOSA WITH A LYMPHOID AGGREGATE. - HIGH GRADE DYSPLASIA IS NOT IDENTIFIED.  Imaging:   Past Medical History:  Diagnosis Date   AICD (automatic  cardioverter/defibrillator) present    Allergic state 05/17/2016   Dr Ernst Bowler asthma and allergy specialist.   Allergy    Arthritis    left knee pain, MRI in 2008-tricompartmental  degenerate changes, mucoid degeneration of ACL, post horn meniscal tear and ant horn meniscal tear   Asthma    Breast cancer in female [facility]) 2000   left; S/P lumpectomy, radiation and chemotherapy   CHF (congestive heart failure) ([place])    Colon polyp    unclear pathology   Hyperlipidemia    Hypertension    LBBB (left bundle branch block)    Low back pain    left L3-4 injected   Lymphedema of left arm    Nonischemic cardiomyopathy ([place])    Presence of permanent cardiac pacemaker    Preventative health care 05/18/2016   Travel sickness 05/12/2015  Unspecified constipation 04/22/2012   Vertigo    Past Surgical History:  Procedure Laterality Date   APPENDECTOMY  2009   Dr. Lucia Gaskins   BI-VENTRICULAR IMPLANTABLE CARDIOVERTER DEFIBRILLATOR N/A 11/16/2013   Procedure: BI-VENTRICULAR IMPLANTABLE CARDIOVERTER DEFIBRILLATOR  (CRT-D);  Surgeon: Evans Lance, MD;  Location: [facility] CATH LAB;  Service: Cardiovascular;  Laterality: N/A;   BI-VENTRICULAR IMPLANTABLE CARDIOVERTER DEFIBRILLATOR  (CRT-D)  11/16/13   MDT CRTD implanted by Dr Lovena Le for NICM, CHF, and LBBB   BIV ICD GENERATOR CHANGEOUT N/A 06/19/2020   Procedure: BIV ICD GENERATOR CHANGEOUT;  Surgeon: Evans Lance, MD;  Location: [place] CV LAB;  Service: Cardiovascular;  Laterality: N/A;   BREAST BIOPSY Left 2000   BREAST LUMPECTOMY Left 2000   CARDIAC CATHETERIZATION  ~ 2010   "no blockage"   CARPAL TUNNEL RELEASE Bilateral 2007   Dr. Daylene Katayama   COLONOSCOPY     KNEE ARTHROSCOPY Right    POLYPECTOMY     TONSILLECTOMY     TRIGGER FINGER RELEASE Bilateral 2007   VAGINAL HYSTERECTOMY     Paritial   Family History  Problem Relation Age of Onset   Heart attack Mother 23   Diabetes Sister    Diabetes Brother    Coronary artery disease Other         Female first degree relative <60   Hyperlipidemia Other    Hypertension Other    Cancer Other        prostate, 1st degree relative <50   Coronary artery disease Maternal Grandmother    Colon cancer Maternal Aunt    Cancer Daughter    Allergic rhinitis Neg Hx    Angioedema Neg Hx    Asthma Neg Hx    Eczema Neg Hx    Immunodeficiency Neg Hx    Urticaria Neg Hx    Stomach cancer Neg Hx    Rectal cancer Neg Hx    Esophageal cancer Neg Hx    Liver cancer Neg Hx    Colon polyps Neg Hx    Social History   Tobacco Use   Smoking status: Former    Packs/day: 0.10    Years: 2.00    Total pack years: 0.20    Types: Cigarettes    Quit date: 01/18/1970    Years since quitting: 51.7   Smokeless tobacco: Never   Tobacco comments:    "smoked maybe 1 pack/month when I did smoke  Vaping Use   Vaping Use: Never used  Substance Use Topics   Alcohol use: No   Drug use: No   Current Outpatient Medications  Medication Sig Dispense Refill   Ascorbic Acid (VITAMIN C PO) Take 1 tablet by mouth every evening.     bisoprolol (ZEBETA) 5 MG tablet TAKE 1/2 TABLET (2.5 MG TOTAL) BY MOUTH DAILY 45 tablet 3   Cholecalciferol (VITAMIN D3) 50 MCG (2000 UT) TABS Take 6,000 Units by mouth every evening.     fexofenadine (ALLEGRA) 180 MG tablet Take 180 mg by mouth daily.     meclizine (ANTIVERT) 12.5 MG tablet Take 1 tablet (12.5 mg total) by mouth 3 (three) times daily as needed for dizziness. 90 tablet 3   Multiple Vitamin (MULTIVITAMIN WITH MINERALS) TABS tablet Take 1 tablet by mouth every evening.     spironolactone (ALDACTONE) 25 MG tablet Take 0.5 tablets (12.5 mg total) by mouth daily. 45 tablet 3   vitamin C (ASCORBIC ACID) 500 MG tablet Take 500 mg by mouth every evening.  Vitamin E 670 MG (1000 UT) CAPS Take 1,000 Units by mouth 2 (two) times a week. In the evening     Zinc 50 MG TABS Take 50 mg by mouth every other day. In the evening     No current facility-administered medications for  this visit.   Allergies  Allergen Reactions   Atorvastatin Other (See Comments)    Myalgia, weakness   Naproxen Sodium Shortness Of Breath    Difficulty breathing   Shellfish Allergy Shortness Of Breath    Cant breath    Contrast Media [Iodinated Contrast Media] Nausea And Vomiting   Crestor [Rosuvastatin] Other (See Comments)    Myalgia, weakness   Metrizamide Nausea And Vomiting   Lisinopril Swelling and Cough    Difficulty breathing   Penicillins Rash    Rash on arms   Simvastatin Other (See Comments)    Myalgia, weakness     Review of Systems: Positive for ***.  All other systems reviewed and negative except where noted in HPI.   Wt Readings from Last 3 Encounters:  09/15/21 216 lb (98 kg)  09/02/21 215 lb 6.4 oz (97.7 kg)  06/16/21 217 lb (98.4 kg)    Physical Exam   There were no vitals taken for this visit. Constitutional:  Generally well appearing ***female in no acute distress. Psychiatric: Pleasant. Normal mood and affect. Behavior is normal. EENT: Pupils normal.  Conjunctivae are normal. No scleral icterus. Neck supple.  Cardiovascular: Normal rate, regular rhythm. No edema Pulmonary/chest: Effort normal and breath sounds normal. No wheezing, rales or rhonchi. Abdominal: Soft, nondistended, nontender. Bowel sounds active throughout. There are no masses palpable. No hepatomegaly. Neurological: Alert and oriented to person place and time. Skin: Skin is warm and dry. No rashes noted.  Tye Savoy, NP  10/22/2021, 9:59 AM  Cc:  Referring Provider Mosie Lukes, MD

## 2021-10-22 NOTE — Progress Notes (Signed)
Chief Complaint:  none, here to discuss colonoscopy   Assessment & Plan   76 year old female with history of adenomatous colon polyps.  She had 2 small tubular adenomas removed in December 2018.  Originally told she needed a 5-year surveillance colonoscopy.  Based on most current polyp surveillance guidelines she would now be due in 7 years from her last one.  She has no family history of colon cancer.  She has no alarm symptoms and hemoglobin is normal.  We discussed the guideline changes.  Will put her on the colonoscopy recall list for December 2025.  At that time she can come in to discuss whether or not we want to proceed with surveillance program given that she will be close to 76 years of age   HPI:    Brenda Marsh is a 76 y.o. year old female known to Dr. Ardis Marsh with a past medical history of colon polyps, breast cancer, appendectomy,  AICD placement.  See PMH / [place] for additional history  Brenda Marsh was told in 2018 that she would need a 5-year follow-up colonoscopy.  She has no [facility] of colon cancer. She has no blood in stool or blood changes. She feels well.   Hgb 12.9   Previous Labs / Imaging::    Latest Ref Rng & Units 09/15/2021    4:02 PM 10/29/2020   11:27 AM 06/10/2020    9:55 AM  CBC  WBC 4.0 - 10.5 K/uL 6.3  6.4  7.0   Hemoglobin 12.0 - 15.0 g/dL 12.9  12.5  12.9   Hematocrit 36.0 - 46.0 % 38.7  37.3  38.4   Platelets 150.0 - 400.0 K/uL 221.0  228  197     Lab Results  Component Value Date   LIPASE 113 08/31/2009      Latest Ref Rng & Units 09/15/2021    4:02 PM 10/29/2020   11:27 AM 06/10/2020    9:55 AM  CMP  Glucose 70 - 99 mg/dL 119  82  84   BUN 6 - 23 mg/dL '9  11  10   '$ Creatinine 0.40 - 1.20 mg/dL 0.82  0.90  0.93   Sodium 135 - 145 mEq/L 139  139  140   Potassium 3.5 - 5.1 mEq/L 4.0  3.8  4.4   Chloride 96 - 112 mEq/L 105  104  103   CO2 19 - 32 mEq/L '23  25  23   '$ Calcium 8.4 - 10.5 mg/dL 9.8  10.7  10.1   Total Protein 6.0 - 8.3 g/dL  8.0  7.7    Total Bilirubin 0.2 - 1.2 mg/dL 0.4  0.4    Alkaline Phos 39 - 117 U/L 84  67    AST 0 - 37 U/L 24  22    ALT 0 - 35 U/L 21  18        Previous GI Evaluation  Dec 2018 colonoscopy -Three 2 to 4 mm polyps in the rectum, in the descending colon and in the ascending colon, removed with a cold snare. Resected and retrieved. - Diverticulosis - The examination was otherwise normal on direct and retroflexion views Surgical [P], rectum, descending and ascending, polyp (3) - TUBULAR ADENOMAS (TWO). - SEPARATE FRAGMENT OF BENIGN POLYPOID COLORECTAL MUCOSA WITH A LYMPHOID AGGREGATE. - HIGH GRADE DYSPLASIA IS NOT IDENTIFIED.  Imaging:   Past Medical History:  Diagnosis Date   AICD (automatic cardioverter/defibrillator) present    Allergic state 05/17/2016   Dr  Gallagher asthma and allergy specialist.   Allergy    Arthritis    left knee pain, MRI in 2008-tricompartmental  degenerate changes, mucoid degeneration of ACL, post horn meniscal tear and ant horn meniscal tear   Asthma    Breast cancer in female [facility]) 2000   left; S/P lumpectomy, radiation and chemotherapy   CHF (congestive heart failure) ([place])    Colon polyp    unclear pathology   Hyperlipidemia    Hypertension    LBBB (left bundle branch block)    Low back pain    left L3-4 injected   Lymphedema of left arm    Nonischemic cardiomyopathy ([place])    Presence of permanent cardiac pacemaker    Preventative health care 05/18/2016   Travel sickness 05/12/2015   Unspecified constipation 04/22/2012   Vertigo    Past Surgical History:  Procedure Laterality Date   APPENDECTOMY  2009   Dr. Lucia Gaskins   BI-VENTRICULAR IMPLANTABLE CARDIOVERTER DEFIBRILLATOR N/A 11/16/2013   Procedure: BI-VENTRICULAR IMPLANTABLE CARDIOVERTER DEFIBRILLATOR  (CRT-D);  Surgeon: Evans Lance, MD;  Location[facility] C[facility] LAB;  Service: Cardiovascular;  Laterality: N/A;   BI-VENTRICULAR IMPLANTABLE CARDIOVERTER DEFIBRILLATOR  (CRT-D)  11/16/13   MDT  CRTD implanted by Dr Lovena Le for NICM, CHF, and LBBB   BIV ICD GENERATOR CHANGEOUT N/A 06/19/2020   Procedure: BIV ICD GENERATOR CHANGEOUT;  Surgeon: Evans Lance, MD;  Location: [place] CV LAB;  Service: Cardiovascular;  Laterality: N/A;   BREAST BIOPSY Left 2000   BREAST LUMPECTOMY Left 2000   CARDIAC CATHETERIZATION  ~ 2010   "no blockage"   CARPAL TUNNEL RELEASE Bilateral 2007   Dr. Daylene Katayama   COLONOSCOPY     KNEE ARTHROSCOPY Right    POLYPECTOMY     TONSILLECTOMY     TRIGGER FINGER RELEASE Bilateral 2007   VAGINAL HYSTERECTOMY     Paritial   Family History  Problem Relation Age of Onset   Heart attack Mother 68   Diabetes Sister    Diabetes Brother    Coronary artery disease Other        Female first degree relative <60   Hyperlipidemia Other    Hypertension Other    Cancer Other        prostate, 1st degree relative <50   Coronary artery disease Maternal Grandmother    Colon cancer Maternal Aunt    Cancer Daughter    Allergic rhinitis Neg Hx    Angioedema Neg Hx    Asthma Neg Hx    Eczema Neg Hx    Immunodeficiency Neg Hx    Urticaria Neg Hx    Stomach cancer Neg Hx    Rectal cancer Neg Hx    Esophageal cancer Neg Hx    Liver cancer Neg Hx    Colon polyps Neg Hx    Social History   Tobacco Use   Smoking status: Former    Packs/day: 0.10    Years: 2.00    Total pack years: 0.20    Types: Cigarettes    Quit date: 01/18/1970    Years since quitting: 51.7   Smokeless tobacco: Never   Tobacco comments:    "smoked maybe 1 pack/month when I did smoke  Vaping Use   Vaping Use: Never used  Substance Use Topics   Alcohol use: No   Drug use: No   Current Outpatient Medications  Medication Sig Dispense Refill   Ascorbic Acid (VITAMIN C PO) Take 1 tablet by mouth every evening.  bisoprolol (ZEBETA) 5 MG tablet TAKE 1/2 TABLET (2.5 MG TOTAL) BY MOUTH DAILY 45 tablet 3   Cholecalciferol (VITAMIN D3) 50 MCG (2000 UT) TABS Take 6,000 Units by mouth every  evening.     fexofenadine (ALLEGRA) 180 MG tablet Take 180 mg by mouth daily.     meclizine (ANTIVERT) 12.5 MG tablet Take 1 tablet (12.5 mg total) by mouth 3 (three) times daily as needed for dizziness. 90 tablet 3   Multiple Vitamin (MULTIVITAMIN WITH MINERALS) TABS tablet Take 1 tablet by mouth every evening.     spironolactone (ALDACTONE) 25 MG tablet Take 0.5 tablets (12.5 mg total) by mouth daily. 45 tablet 3   vitamin C (ASCORBIC ACID) 500 MG tablet Take 500 mg by mouth every evening.     Vitamin E 670 MG (1000 UT) CAPS Take 1,000 Units by mouth 2 (two) times a week. In the evening     Zinc 50 MG TABS Take 50 mg by mouth every other day. In the evening     No current facility-administered medications for this visit.   Allergies  Allergen Reactions   Atorvastatin Other (See Comments)    Myalgia, weakness   Naproxen Sodium Shortness Of Breath    Difficulty breathing   Shellfish Allergy Shortness Of Breath    Cant breath    Contrast Media [Iodinated Contrast Media] Nausea And Vomiting   Crestor [Rosuvastatin] Other (See Comments)    Myalgia, weakness   Metrizamide Nausea And Vomiting   Lisinopril Swelling and Cough    Difficulty breathing   Penicillins Rash    Rash on arms   Simvastatin Other (See Comments)    Myalgia, weakness     Review of Systems: All systems reviewed and negative except where noted in HPI.   Wt Readings from Last 3 Encounters:  10/22/21 216 lb (98 kg)  09/15/21 216 lb (98 kg)  09/02/21 215 lb 6.4 oz (97.7 kg)    Physical Exam   BP 130/68   Pulse 78   Ht '5\' 4"'$  (1.626 m)   Wt 216 lb (98 kg)   SpO2 98%   BMI 37.08 kg/m  Constitutional:  Generally well appearing female in no acute distress. Psychiatric: Pleasant. Normal mood and affect. Behavior is normal. EENT: Pupils normal.  Conjunctivae are normal. No scleral icterus. Neck supple.  Cardiovascular: Normal rate, regular rhythm. No edema Pulmonary/chest: Effort normal and breath sounds  normal. No wheezing, rales or rhonchi. Abdominal: Soft, nondistended, nontender. Bowel sounds active throughout. There are no masses palpable. No hepatomegaly. Neurological: Alert and oriented to person place and time. Skin: Skin is warm and dry. No rashes noted.  Tye Savoy, NP  10/22/2021, 10:49 AM  Cc:  Referring Provider Mosie Lukes, MD

## 2021-10-22 NOTE — Patient Instructions (Signed)
You will be due for a recall colonoscopy in 12/19/2023. We will send you a reminder in the mail when it gets closer to that time.  _______________________________________________________  If you are age 76 or older, your body mass index should be between 23-30. Your Body mass index is 37.08 kg/m. If this is out of the aforementioned range listed, please consider follow up with your Primary Care Provider.  If you are age 32 or younger, your body mass index should be between 19-25. Your Body mass index is 37.08 kg/m. If this is out of the aformentioned range listed, please consider follow up with your Primary Care Provider.   ________________________________________________________  The [facility] providers would like to encourage you to use [facility] to communicate with providers for non-urgent requests or questions.  Due to long hold times on the telephone, sending your provider a message by [facility] may be a faster and more efficient way to get a response.  Please allow 48 business hours for a response.  Please remember that this is for non-urgent requests.  _______________________________________________________  Thank you for choosing me and [facility].

## 2021-10-22 NOTE — Progress Notes (Signed)
Agree with assessment / plan as outlined.  

## 2021-10-29 ENCOUNTER — Inpatient Hospital Stay (HOSPITAL_BASED_OUTPATIENT_CLINIC_OR_DEPARTMENT_OTHER): Payer: No Typology Code available for payment source | Admitting: Hematology & Oncology

## 2021-10-29 ENCOUNTER — Inpatient Hospital Stay: Payer: No Typology Code available for payment source | Attending: Hematology & Oncology

## 2021-10-29 ENCOUNTER — Encounter: Payer: Self-pay | Admitting: Hematology & Oncology

## 2021-10-29 ENCOUNTER — Telehealth: Payer: Self-pay | Admitting: *Deleted

## 2021-10-29 VITALS — BP 127/63 | HR 80 | Temp 98.7°F | Resp 18 | Wt 217.0 lb

## 2021-10-29 DIAGNOSIS — Z853 Personal history of malignant neoplasm of breast: Secondary | ICD-10-CM | POA: Diagnosis not present

## 2021-10-29 DIAGNOSIS — Z08 Encounter for follow-up examination after completed treatment for malignant neoplasm: Secondary | ICD-10-CM | POA: Insufficient documentation

## 2021-10-29 LAB — CBC WITH DIFFERENTIAL (CANCER CENTER ONLY)
Abs Immature Granulocytes: 0.02 10*3/uL (ref 0.00–0.07)
Basophils Absolute: 0.1 10*3/uL (ref 0.0–0.1)
Basophils Relative: 1 %
Eosinophils Absolute: 0.4 10*3/uL (ref 0.0–0.5)
Eosinophils Relative: 6 %
HCT: 38.7 % (ref 36.0–46.0)
Hemoglobin: 12.8 g/dL (ref 12.0–15.0)
Immature Granulocytes: 0 %
Lymphocytes Relative: 42 %
Lymphs Abs: 2.7 10*3/uL (ref 0.7–4.0)
MCH: 31 pg (ref 26.0–34.0)
MCHC: 33.1 g/dL (ref 30.0–36.0)
MCV: 93.7 fL (ref 80.0–100.0)
Monocytes Absolute: 0.5 10*3/uL (ref 0.1–1.0)
Monocytes Relative: 7 %
Neutro Abs: 2.8 10*3/uL (ref 1.7–7.7)
Neutrophils Relative %: 44 %
Platelet Count: 214 10*3/uL (ref 150–400)
RBC: 4.13 MIL/uL (ref 3.87–5.11)
RDW: 14.2 % (ref 11.5–15.5)
WBC Count: 6.3 10*3/uL (ref 4.0–10.5)
nRBC: 0 % (ref 0.0–0.2)

## 2021-10-29 LAB — CMP (CANCER CENTER ONLY)
ALT: 20 U/L (ref 0–44)
AST: 25 U/L (ref 15–41)
Albumin: 4.5 g/dL (ref 3.5–5.0)
Alkaline Phosphatase: 71 U/L (ref 38–126)
Anion gap: 9 (ref 5–15)
BUN: 9 mg/dL (ref 8–23)
CO2: 25 mmol/L (ref 22–32)
Calcium: 10.5 mg/dL — ABNORMAL HIGH (ref 8.9–10.3)
Chloride: 105 mmol/L (ref 98–111)
Creatinine: 0.91 mg/dL (ref 0.44–1.00)
GFR, Estimated: >60 mL/min (ref >60)
Glucose, Bld: 82 mg/dL (ref 70–99)
Potassium: 4.2 mmol/L (ref 3.5–5.1)
Sodium: 139 mmol/L (ref 135–145)
Total Bilirubin: 0.4 mg/dL (ref 0.3–1.2)
Total Protein: 8.4 g/dL — ABNORMAL HIGH (ref 6.5–8.1)

## 2021-10-29 NOTE — Telephone Encounter (Signed)
Per 10/29/21 los - gave upcoming appointments - confirmed

## 2021-10-29 NOTE — Progress Notes (Signed)
Hematology and Oncology Follow Up Visit  Brenda Marsh [id number] Nov 27, 1945 76 y.o. 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 She comes back for her yearly visit.  As always, she is doing pretty well.  We see her yearly.  The only issue that she really had was that there was an abnormality with an mammogram that she had done back in October last year.  This of the right breast.  She had ultrasound and biopsy.  The biopsy was done on 11/26/2020.  The pathology report ([id number]) showed a fibroadenoma.  She has a lot of lymphedema of the left arm.  We will go ahead and order her a lymphedema sleeve.  She will have 1 made for her.  Her heart is doing okay.  She has a defibrillator.  Thankfully, this is not going off.  She has had no problems with bleeding or bruising.  There is no change in bowel or bladder habits.  She has had limited swelling in the left ankle.  I think she saw a podiatrist.  She has had no rashes.  There has been no cough or shortness of breath.  Overall, her performance status is ECOG 1.    Medications:  Allergies as of 10/29/2021       Reactions   Atorvastatin Other (See Comments)   Myalgia, weakness   Naproxen Sodium Shortness Of Breath   Difficulty breathing   Shellfish Allergy Shortness Of Breath   Cant breath   Contrast Media [iodinated Contrast Media] Nausea And Vomiting   Crestor [rosuvastatin] Other (See Comments)   Myalgia, weakness   Metrizamide Nausea And Vomiting   Lisinopril Swelling, Cough   Difficulty breathing   Penicillins Rash   Rash on arms   Simvastatin Other (See Comments)   Myalgia, weakness        Medication List        Accurate as of October 29, 2021 12:38 PM. If you have any questions, ask your nurse or doctor.          ascorbic acid 500 MG  tablet Commonly known as: VITAMIN C Take 500 mg by mouth every evening.   bisoprolol 5 MG tablet Commonly known as: ZEBETA TAKE 1/2 TABLET (2.5 MG TOTAL) BY MOUTH DAILY   fexofenadine 180 MG tablet Commonly known as: ALLEGRA Take 180 mg by mouth daily.   meclizine 12.5 MG tablet Commonly known as: ANTIVERT Take 1 tablet (12.5 mg total) by mouth 3 (three) times daily as needed for dizziness.   multivitamin with minerals Tabs tablet Take 1 tablet by mouth every evening.   spironolactone 25 MG tablet Commonly known as: ALDACTONE Take 0.5 tablets (12.5 mg total) by mouth daily.   VITAMIN C PO Take 1 tablet by mouth every evening.   Vitamin D3 50 MCG (2000 UT) Tabs Take 6,000 Units by mouth every evening.   Vitamin E 670 MG (1000 UT) Caps Take 1,000 Units by mouth 2 (two) times a week. In the evening   Zinc 50 MG Tabs Take 50 mg by mouth every other day. In the evening        Allergies:   Past Medical History, Surgical history, Social history, and Family History were reviewed and updated.  Review of Systems: Review of Systems  Constitutional: Negative.   HENT: Negative.  Eyes: Negative.   Respiratory: Negative.    Cardiovascular: Negative.   Gastrointestinal: Negative.   Genitourinary: Negative.   Musculoskeletal: Negative.   Skin: Negative.   Neurological: Negative.   Endo/Heme/Allergies: Negative.   Psychiatric/Behavioral: Negative.       Physical Exam:  weight is 217 lb (98.4 kg). Her oral temperature is 98.7 F (37.1 C). Her blood pressure is 127/63 and her pulse is 80. Her respiration is 18 and oxygen saturation is 100%.   Wt Readings from Last 3 Encounters:  10/29/21 217 lb (98.4 kg)  10/22/21 216 lb (98 kg)  09/15/21 216 lb (98 kg)    Physical Exam Vitals reviewed.  Constitutional:      Comments: Breast exam shows her right breast with no masses, edema or erythema.  There is no right axillary adenopathy.  Left breast is slightly contracted  from surgery and radiation.  She has a well-healed lumpectomy scar at the 2 o'clock position.  There is some firmness at the lumpectomy site.  She has no distinct mass in the left breast.  There is no left axillary adenopathy.  HENT:     Head: Normocephalic and atraumatic.  Eyes:     Pupils: Pupils are equal, round, and reactive to light.  Cardiovascular:     Rate and Rhythm: Normal rate and regular rhythm.     Heart sounds: Normal heart sounds.  Pulmonary:     Effort: Pulmonary effort is normal.     Breath sounds: Normal breath sounds.  Abdominal:     General: Bowel sounds are normal.     Palpations: Abdomen is soft.  Musculoskeletal:        General: No tenderness or deformity. Normal range of motion.     Cervical back: Normal range of motion.     Comments: There is lymphedema in the left arm.  This is moderate.  There is no erythema or warmth.  She has good range of motion of the left arm.  Lymphadenopathy:     Cervical: No cervical adenopathy.  Skin:    General: Skin is warm and dry.     Findings: No erythema or rash.  Neurological:     Mental Status: She is alert and oriented to person, place, and time.  Psychiatric:        Behavior: Behavior normal.        Thought Content: Thought content normal.        Judgment: Judgment normal.     Lab Results  Component Value Date   WBC 6.3 10/29/2021   HGB 12.8 10/29/2021   HCT 38.7 10/29/2021   MCV 93.7 10/29/2021   PLT 214 10/29/2021   Lab Results  Component Value Date   IRON 56 12/21/2012   TIBC 429 12/21/2012   UIBC 373 12/21/2012   IRONPCTSAT 13 (L) 12/21/2012   Lab Results  Component Value Date   RBC 4.13 10/29/2021   No results found for: "KPAFRELGTCHN", "LAMBDASER", "KAPLAMBRATIO" No results found for: "IGGSERUM", "IGA", "IGMSERUM" No results found for: "TOTALPROTELP", "ALBUMINELP", "A1GS", "A2GS", "BETS", "BETA2SER", "GAMS", "MSPIKE", "SPEI"   Chemistry      Component Value Date/Time   NA 139 10/29/2021 1128    NA 140 06/10/2020 0955   NA 143 09/29/2016 1500   K 4.2 10/29/2021 1128   K 3.8 09/29/2016 1500   CL 105 10/29/2021 1128   CL 105 09/29/2016 1500   CO2 25 10/29/2021 1128   CO2 28 09/29/2016 1500   BUN 9 10/29/2021 1128  BUN 10 06/10/2020 0955   BUN 12 09/29/2016 1500   CREATININE 0.91 10/29/2021 1128   CREATININE 0.9 09/29/2016 1500      Component Value Date/Time   CALCIUM 10.5 (H) 10/29/2021 1128   CALCIUM 9.7 09/29/2016 1500   ALKPHOS 71 10/29/2021 1128   ALKPHOS 81 09/29/2016 1500   AST 25 10/29/2021 1128   ALT 20 10/29/2021 1128   ALT 23 09/29/2016 1500   BILITOT 0.4 10/29/2021 1128     Impression and Plan: Ms. Rochette is a 76 year old African-American female with stage IIA ductal carcinoma the left breast. She had 1 lymph node positive. Her tumor was ER positive. She was on tamoxifen.  She is approximate 21 years out from treatment.  I am glad that there is no problem with the right breast.  Again, she is followed closely and gets her mammograms yearly.  We will plan to get her back in 1 year.  We can always get her back sooner if she has any problems.     [name], MD 10/12/202312:38 PM

## 2021-11-10 ENCOUNTER — Ambulatory Visit: Payer: No Typology Code available for payment source

## 2021-11-11 DIAGNOSIS — H524 Presbyopia: Secondary | ICD-10-CM | POA: Diagnosis not present

## 2021-11-11 DIAGNOSIS — H2513 Age-related nuclear cataract, bilateral: Secondary | ICD-10-CM | POA: Diagnosis not present

## 2021-11-11 DIAGNOSIS — E119 Type 2 diabetes mellitus without complications: Secondary | ICD-10-CM | POA: Diagnosis not present

## 2021-11-11 DIAGNOSIS — H18413 Arcus senilis, bilateral: Secondary | ICD-10-CM | POA: Diagnosis not present

## 2021-11-11 DIAGNOSIS — H5203 Hypermetropia, bilateral: Secondary | ICD-10-CM | POA: Diagnosis not present

## 2021-11-11 DIAGNOSIS — H43393 Other vitreous opacities, bilateral: Secondary | ICD-10-CM | POA: Diagnosis not present

## 2021-11-11 DIAGNOSIS — H25013 Cortical age-related cataract, bilateral: Secondary | ICD-10-CM | POA: Diagnosis not present

## 2021-11-11 DIAGNOSIS — H43813 Vitreous degeneration, bilateral: Secondary | ICD-10-CM | POA: Diagnosis not present

## 2021-11-11 LAB — HM DIABETES EYE EXAM

## 2021-11-12 ENCOUNTER — Encounter: Payer: Self-pay | Admitting: *Deleted

## 2021-11-13 DIAGNOSIS — Z1231 Encounter for screening mammogram for malignant neoplasm of breast: Secondary | ICD-10-CM | POA: Diagnosis not present

## 2021-11-13 LAB — HM MAMMOGRAPHY

## 2021-11-18 ENCOUNTER — Encounter: Payer: Self-pay | Admitting: Family Medicine

## 2021-12-02 DIAGNOSIS — H5203 Hypermetropia, bilateral: Secondary | ICD-10-CM | POA: Diagnosis not present

## 2021-12-02 DIAGNOSIS — H524 Presbyopia: Secondary | ICD-10-CM | POA: Diagnosis not present

## 2021-12-10 ENCOUNTER — Other Ambulatory Visit: Payer: Self-pay | Admitting: Cardiology

## 2021-12-16 ENCOUNTER — Other Ambulatory Visit (HOSPITAL_BASED_OUTPATIENT_CLINIC_OR_DEPARTMENT_OTHER): Payer: Self-pay

## 2021-12-16 MED ORDER — COMIRNATY 30 MCG/0.3ML IM SUSY
PREFILLED_SYRINGE | INTRAMUSCULAR | 0 refills | Status: DC
  Filled 2021-12-16: qty 0.3, 1d supply, fill #0

## 2021-12-17 ENCOUNTER — Ambulatory Visit (INDEPENDENT_AMBULATORY_CARE_PROVIDER_SITE_OTHER): Payer: No Typology Code available for payment source

## 2021-12-17 DIAGNOSIS — I428 Other cardiomyopathies: Secondary | ICD-10-CM

## 2021-12-17 LAB — CUP PACEART REMOTE DEVICE CHECK
Battery Remaining Longevity: 71 mo
Battery Voltage: 2.98 V
Brady Statistic AP VP Percent: 0.31 %
Brady Statistic AP VS Percent: 0.01 %
Brady Statistic AS VP Percent: 95.74 %
Brady Statistic AS VS Percent: 3.94 %
Brady Statistic RA Percent Paced: 0.32 %
Brady Statistic RV Percent Paced: 86.77 %
Date Time Interrogation Session: 20231130001705
HighPow Impedance: 74 Ohm
Implantable Lead Connection Status: 753985
Implantable Lead Connection Status: 753985
Implantable Lead Connection Status: 753985
Implantable Lead Implant Date: 20151030
Implantable Lead Implant Date: 20151030
Implantable Lead Implant Date: 20151030
Implantable Lead Location: 753858
Implantable Lead Location: 753859
Implantable Lead Location: 753860
Implantable Lead Model: 4598
Implantable Lead Model: 5076
Implantable Lead Model: 6935
Implantable Pulse Generator Implant Date: 20220602
Lead Channel Impedance Value: 1026 Ohm
Lead Channel Impedance Value: 1140 Ohm
Lead Channel Impedance Value: 1349 Ohm
Lead Channel Impedance Value: 1406 Ohm
Lead Channel Impedance Value: 256.667
Lead Channel Impedance Value: 269.677
Lead Channel Impedance Value: 276.523
Lead Channel Impedance Value: 342 Ohm
Lead Channel Impedance Value: 366.603
Lead Channel Impedance Value: 393.735
Lead Channel Impedance Value: 399 Ohm
Lead Channel Impedance Value: 418 Ohm
Lead Channel Impedance Value: 456 Ohm
Lead Channel Impedance Value: 665 Ohm
Lead Channel Impedance Value: 760 Ohm
Lead Channel Impedance Value: 817 Ohm
Lead Channel Impedance Value: 988 Ohm
Lead Channel Impedance Value: 988 Ohm
Lead Channel Pacing Threshold Amplitude: 0.875 V
Lead Channel Pacing Threshold Amplitude: 0.875 V
Lead Channel Pacing Threshold Amplitude: 1.75 V
Lead Channel Pacing Threshold Pulse Width: 0.4 ms
Lead Channel Pacing Threshold Pulse Width: 0.4 ms
Lead Channel Pacing Threshold Pulse Width: 0.8 ms
Lead Channel Sensing Intrinsic Amplitude: 12.125 mV
Lead Channel Sensing Intrinsic Amplitude: 12.125 mV
Lead Channel Sensing Intrinsic Amplitude: 3.5 mV
Lead Channel Sensing Intrinsic Amplitude: 3.5 mV
Lead Channel Setting Pacing Amplitude: 1.75 V
Lead Channel Setting Pacing Amplitude: 2 V
Lead Channel Setting Pacing Amplitude: 2.5 V
Lead Channel Setting Pacing Pulse Width: 0.3 ms
Lead Channel Setting Pacing Pulse Width: 0.8 ms
Lead Channel Setting Sensing Sensitivity: 0.3 mV
Zone Setting Status: 755011
Zone Setting Status: 755011

## 2021-12-21 ENCOUNTER — Ambulatory Visit: Payer: No Typology Code available for payment source

## 2021-12-22 ENCOUNTER — Ambulatory Visit: Payer: No Typology Code available for payment source

## 2022-01-06 NOTE — Progress Notes (Signed)
Remote ICD transmission.   

## 2022-01-26 ENCOUNTER — Ambulatory Visit: Payer: No Typology Code available for payment source | Attending: Internal Medicine | Admitting: Internal Medicine

## 2022-01-26 ENCOUNTER — Encounter: Payer: Self-pay | Admitting: Internal Medicine

## 2022-01-26 VITALS — BP 120/72 | HR 80 | Ht 64.0 in | Wt 215.0 lb

## 2022-01-26 DIAGNOSIS — I10 Essential (primary) hypertension: Secondary | ICD-10-CM | POA: Diagnosis not present

## 2022-01-26 DIAGNOSIS — Z95810 Presence of automatic (implantable) cardiac defibrillator: Secondary | ICD-10-CM | POA: Diagnosis not present

## 2022-01-26 DIAGNOSIS — I5042 Chronic combined systolic (congestive) and diastolic (congestive) heart failure: Secondary | ICD-10-CM

## 2022-01-26 LAB — CUP PACEART INCLINIC DEVICE CHECK
Battery Remaining Longevity: 68 mo
Battery Voltage: 2.98 V
Brady Statistic AP VP Percent: 0.44 %
Brady Statistic AP VS Percent: 0.02 %
Brady Statistic AS VP Percent: 95.78 %
Brady Statistic AS VS Percent: 3.76 %
Brady Statistic RA Percent Paced: 0.46 %
Brady Statistic RV Percent Paced: 80.19 %
Date Time Interrogation Session: 20240109115347
HighPow Impedance: 75 Ohm
Implantable Lead Connection Status: 753985
Implantable Lead Connection Status: 753985
Implantable Lead Connection Status: 753985
Implantable Lead Implant Date: 20151030
Implantable Lead Implant Date: 20151030
Implantable Lead Implant Date: 20151030
Implantable Lead Location: 753858
Implantable Lead Location: 753859
Implantable Lead Location: 753860
Implantable Lead Model: 4598
Implantable Lead Model: 5076
Implantable Lead Model: 6935
Implantable Pulse Generator Implant Date: 20220602
Lead Channel Impedance Value: 1064 Ohm
Lead Channel Impedance Value: 1064 Ohm
Lead Channel Impedance Value: 1254 Ohm
Lead Channel Impedance Value: 1406 Ohm
Lead Channel Impedance Value: 1425 Ohm
Lead Channel Impedance Value: 276.59 Ohm
Lead Channel Impedance Value: 279.484
Lead Channel Impedance Value: 299.657
Lead Channel Impedance Value: 361 Ohm
Lead Channel Impedance Value: 389.614
Lead Channel Impedance Value: 395.381
Lead Channel Impedance Value: 399 Ohm
Lead Channel Impedance Value: 456 Ohm
Lead Channel Impedance Value: 456 Ohm
Lead Channel Impedance Value: 703 Ohm
Lead Channel Impedance Value: 722 Ohm
Lead Channel Impedance Value: 874 Ohm
Lead Channel Impedance Value: 988 Ohm
Lead Channel Pacing Threshold Amplitude: 0.75 V
Lead Channel Pacing Threshold Amplitude: 0.875 V
Lead Channel Pacing Threshold Amplitude: 1.75 V
Lead Channel Pacing Threshold Pulse Width: 0.4 ms
Lead Channel Pacing Threshold Pulse Width: 0.4 ms
Lead Channel Pacing Threshold Pulse Width: 0.8 ms
Lead Channel Sensing Intrinsic Amplitude: 14.875 mV
Lead Channel Sensing Intrinsic Amplitude: 15.375 mV
Lead Channel Sensing Intrinsic Amplitude: 3.875 mV
Lead Channel Sensing Intrinsic Amplitude: 5 mV
Lead Channel Setting Pacing Amplitude: 2 V
Lead Channel Setting Pacing Amplitude: 2 V
Lead Channel Setting Pacing Amplitude: 2.5 V
Lead Channel Setting Pacing Pulse Width: 0.4 ms
Lead Channel Setting Pacing Pulse Width: 0.8 ms
Lead Channel Setting Sensing Sensitivity: 0.3 mV
Zone Setting Status: 755011
Zone Setting Status: 755011

## 2022-01-26 NOTE — Patient Instructions (Signed)
Medication Instructions:  Your physician recommends that you continue on your current medications as directed. Please refer to the Current Medication list given to you today.  *If you need a refill on your cardiac medications before your next appointment, please call your pharmacy*  Lab Work: None ordered.  If you have labs (blood work) drawn today and your tests are completely normal, you will receive your results only by: [place] (if you have MyChart) OR A paper copy in the mail If you have any lab test that is abnormal or we need to change your treatment, we will call you to review the results.  Testing/Procedures: None ordered.  Follow-Up: At [facility], you and your health needs are our priority.  As part of our continuing mission to provide you with exceptional heart care, we have created designated Provider Care Teams.  These Care Teams include your primary Cardiologist (physician) and Advanced Practice Providers (APPs -  Physician Assistants and Nurse Practitioners) who all work together to provide you with the care you need, when you need it.  We recommend signing up for the patient portal called "MyChart".  Sign up information is provided on this After Visit Summary.  MyChart is used to connect with patients for Virtual Visits (Telemedicine).  Patients are able to view lab/test results, encounter notes, upcoming appointments, etc.  Non-urgent messages can be sent to your provider as well.   To learn more about what you can do with MyChart, go to [email or web address].    Your next appointment:   1 year(s)  The format for your next appointment:   In Person  Provider:   Cristopher Peru, MD{or one of the following Advanced Practice Providers on your designated Care Team:   Tommye Standard, [place] Legrand Como "Jonni Sanger" Chalmers Cater, [place]  Remote monitoring is used to monitor your ICD from home. This monitoring reduces the number of office visits required to check your device to one  time per year. It allows [place] to keep an eye on the functioning of your device to ensure it is working properly. You are scheduled for a device check from home on 03/18/22. You may send your transmission at any time that day. If you have a wireless device, the transmission will be sent automatically. After your physician reviews your transmission, you will receive a postcard with your next transmission date.  Important Information About [place]

## 2022-01-26 NOTE — Progress Notes (Signed)
HPI Brenda Marsh returns today for followup. She is a pleasant 77 yo woman with breast CA,s/p chemotherapy, LV dysfunction on maximal medical therapy, s/p biv ICD insertion. She is s/p Biv ICD gen change out and returns forr additional eval.  She denies chest pain or sob. Her lymphedema is better.   Allergies  Allergen Reactions   Atorvastatin Other (See Comments)    Myalgia, weakness   Naproxen Sodium Shortness Of Breath    Difficulty breathing   Shellfish Allergy Shortness Of Breath    Cant breath    Contrast Media [Iodinated Contrast Media] Nausea And Vomiting   Crestor [Rosuvastatin] Other (See Comments)    Myalgia, weakness   Metrizamide Nausea And Vomiting   Lisinopril Swelling and Cough    Difficulty breathing   Penicillins Rash    Rash on arms   Simvastatin Other (See Comments)    Myalgia, weakness     Current Outpatient Medications  Medication Sig Dispense Refill   Ascorbic Acid (VITAMIN C PO) Take 1 tablet by mouth every evening.     bisoprolol (ZEBETA) 5 MG tablet TAKE 1/2 TABLET (2.5 MG TOTAL) BY MOUTH DAILY 45 tablet PRN   Cholecalciferol (VITAMIN D3) 50 MCG (2000 UT) TABS Take 6,000 Units by mouth every evening.     COVID-19 mRNA vaccine 2023-2024 (COMIRNATY) syringe Inject into the muscle. 0.3 mL 0   fexofenadine (ALLEGRA) 180 MG tablet Take 180 mg by mouth daily.     meclizine (ANTIVERT) 12.5 MG tablet Take 1 tablet (12.5 mg total) by mouth 3 (three) times daily as needed for dizziness. 90 tablet 3   Multiple Vitamin (MULTIVITAMIN WITH MINERALS) TABS tablet Take 1 tablet by mouth every evening.     spironolactone (ALDACTONE) 25 MG tablet Take 0.5 tablets (12.5 mg total) by mouth daily. 45 tablet 3   vitamin C (ASCORBIC ACID) 500 MG tablet Take 500 mg by mouth every evening.     Vitamin E 670 MG (1000 UT) CAPS Take 1,000 Units by mouth 2 (two) times a week. In the evening     Zinc 50 MG TABS Take 50 mg by mouth every other day. In the evening     No  current facility-administered medications for this visit.     Past Medical History:  Diagnosis Date   AICD (automatic cardioverter/defibrillator) present    Allergic state 05/17/2016   Dr Ernst Bowler asthma and allergy specialist.   Allergy    Arthritis    left knee pain, MRI in 2008-tricompartmental  degenerate changes, mucoid degeneration of ACL, post horn meniscal tear and ant horn meniscal tear   Asthma    Breast cancer in female [facility]) 2000   left; S/P lumpectomy, radiation and chemotherapy   CHF (congestive heart failure) ([place])    Colon polyp    unclear pathology   Hyperlipidemia    Hypertension    LBBB (left bundle branch block)    Low back pain    left L3-4 injected   Lymphedema of left arm    Nonischemic cardiomyopathy ([place])    Presence of permanent cardiac pacemaker    Preventative health care 05/18/2016   Travel sickness 05/12/2015   Unspecified constipation 04/22/2012   Vertigo     ROS:   All systems reviewed and negative except as noted in the HPI.   Past Surgical History:  Procedure Laterality Date   APPENDECTOMY  2009   Dr. Lucia Gaskins   BI-VENTRICULAR IMPLANTABLE CARDIOVERTER DEFIBRILLATOR N/A 11/16/2013  Procedure: BI-VENTRICULAR IMPLANTABLE CARDIOVERTER DEFIBRILLATOR  (CRT-D);  Surgeon: Evans Lance, MD;  Location[facility] CATH LAB;  Service: Cardiovascular;  Laterality: N/A;   BI-VENTRICULAR IMPLANTABLE CARDIOVERTER DEFIBRILLATOR  (CRT-D)  11/16/13   MDT CRTD implanted by Dr Lovena Le for NICM, CHF, and LBBB   BIV ICD GENERATOR CHANGEOUT N/A 06/19/2020   Procedure: BIV ICD GENERATOR CHANGEOUT;  Surgeon: Evans Lance, MD;  Location: [place] CV LAB;  Service: Cardiovascular;  Laterality: N/A;   BREAST BIOPSY Left 2000   BREAST LUMPECTOMY Left 1998-04-15   CARDIAC CATHETERIZATION  ~ 04-14-2008   "no blockage"   CARPAL TUNNEL RELEASE Bilateral 2005/04/14   Dr. Daylene Katayama   COLONOSCOPY     KNEE ARTHROSCOPY Right    POLYPECTOMY     TONSILLECTOMY     TRIGGER FINGER RELEASE Bilateral  2005-04-14   VAGINAL HYSTERECTOMY     Paritial     Family History  Problem Relation Age of Onset   Heart attack Mother 64   Diabetes Sister    Diabetes Brother    Coronary artery disease Other        Female first degree relative <60   Hyperlipidemia Other    Hypertension Other    Cancer Other        prostate, 1st degree relative <50   Coronary artery disease Maternal Grandmother    Colon cancer Maternal Aunt    Cancer Daughter    Allergic rhinitis Neg Hx    Angioedema Neg Hx    Asthma Neg Hx    Eczema Neg Hx    Immunodeficiency Neg Hx    Urticaria Neg Hx    Stomach cancer Neg Hx    Rectal cancer Neg Hx    Esophageal cancer Neg Hx    Liver cancer Neg Hx    Colon polyps Neg Hx      Social History   Socioeconomic History   Marital status: Widowed    Spouse name: Not on file   Number of children: 2   Years of education: 14   Highest education level: Not on file  Occupational History    Employer: DISABLED  Tobacco Use   Smoking status: Former    Packs/day: 0.10    Years: 2.00    Total pack years: 0.20    Types: Cigarettes    Quit date: 01/18/1970    Years since quitting: 52.0   Smokeless tobacco: Never   Tobacco comments:    "smoked maybe 1 pack/month when I did smoke  Vaping Use   Vaping Use: Never used  Substance and Sexual Activity   Alcohol use: No   Drug use: No   Sexual activity: Never    Birth control/protection: Surgical, Post-menopausal  Other Topics Concern   Not on file  Social History Narrative   Widow - husband died in 15-Apr-2003   Occupation - retired from working in Radiation protection practitioner   2 daughter - on lives in [place], other in [place]   Remote history of tobacco but none in 40 years   Caffeine- coffee, 1 cup daily; 1 energy drink daily (V8)   Social Determinants of Health   Financial Resource Strain: [place]  (08/25/2020)   Overall Financial Resource Strain (CARDIA)    Difficulty of Paying Living Expenses: Not hard at all  Food Insecurity: No Food  Insecurity (08/25/2020)   Hunger Vital Sign    Worried About [place] in the Last Year: Never true    Ran Out of  Food in the Last Year: Never true  Transportation Needs: No Transportation Needs (08/25/2020)   PRAPARE - Hydrologist (Medical): No    Lack of Transportation (Non-Medical): No  Physical Activity: Inactive (08/25/2020)   Exercise Vital Sign    Days of Exercise per Week: 0 days    Minutes of Exercise per Session: 0 min  Stress: No Stress Concern Present (08/25/2020)   [place]    Feeling of Stress : Not at all  Social Connections: Moderately Isolated (08/25/2020)   Social Connection and Isolation Panel [NHANES]    Frequency of Communication with Friends and Family: More than three times a week    Frequency of Social Gatherings with Friends and Family: More than three times a week    Attends Religious Services: More than 4 times per year    Active Member of [employer] or Organizations: No    Attends Archivist Meetings: Never    Marital Status: Widowed  Intimate Partner Violence: Not At Risk (08/25/2020)   Humiliation, Afraid, Rape, and Kick questionnaire    Fear of Current or Ex-Partner: No    Emotionally Abused: No    Physically Abused: No    Sexually Abused: No     BP 120/72   Pulse 80   Ht '5\' 4"'$  (1.626 m)   Wt 215 lb (97.5 kg)   SpO2 99%   BMI 36.90 kg/m   Physical Exam:  Well appearing NAD HEENT: Unremarkable Neck:  No JVD, no thyromegally Lymphatics:  No adenopathy Back:  No CVA tenderness Lungs:  Clear HEART:  Regular rate rhythm, no murmurs, no rubs, no clicks Abd:  soft, positive bowel sounds, no organomegally, no rebound, no guarding Ext:  2 plus pulses, no edema, no cyanosis, no clubbing Skin:  No rashes no nodules Neuro:  CN II through XII intact, motor grossly intact  EKG - nsr with biv pacing  DEVICE  Normal device function.  See PaceArt  for details.   Assess/Plan: 1. Chronic systolic heart failure - she is class 2. She will continue her current meds.  2. Obesity - I encouraged the patient to increase her physical activity.  3. ICD - she is s/p biv gen change out and doing well. Normal device function. 4. HTN - her bp is elevated a bit but is better at home. She is encouraged to reduce her salt intake.   Brenda Overlie Agapito Hanway,MD

## 2022-03-18 ENCOUNTER — Ambulatory Visit: Payer: No Typology Code available for payment source

## 2022-03-18 DIAGNOSIS — I428 Other cardiomyopathies: Secondary | ICD-10-CM

## 2022-03-18 LAB — CUP PACEART REMOTE DEVICE CHECK
Battery Remaining Longevity: 64 mo
Battery Voltage: 2.98 V
Brady Statistic AP VP Percent: 0.27 %
Brady Statistic AP VS Percent: 0.01 %
Brady Statistic AS VP Percent: 96.29 %
Brady Statistic AS VS Percent: 3.42 %
Brady Statistic RA Percent Paced: 0.28 %
Brady Statistic RV Percent Paced: 86.1 %
Date Time Interrogation Session: 20240229043724
HighPow Impedance: 74 Ohm
Implantable Lead Connection Status: 753985
Implantable Lead Connection Status: 753985
Implantable Lead Connection Status: 753985
Implantable Lead Implant Date: 20151030
Implantable Lead Implant Date: 20151030
Implantable Lead Implant Date: 20151030
Implantable Lead Location: 753858
Implantable Lead Location: 753859
Implantable Lead Location: 753860
Implantable Lead Model: 4598
Implantable Lead Model: 5076
Implantable Lead Model: 6935
Implantable Pulse Generator Implant Date: 20220602
Lead Channel Impedance Value: 1121 Ohm
Lead Channel Impedance Value: 1254 Ohm
Lead Channel Impedance Value: 1292 Ohm
Lead Channel Impedance Value: 246.655
Lead Channel Impedance Value: 249.375
Lead Channel Impedance Value: 263.855
Lead Channel Impedance Value: 285 Ohm
Lead Channel Impedance Value: 342 Ohm
Lead Channel Impedance Value: 353.147
Lead Channel Impedance Value: 358.75 Ohm
Lead Channel Impedance Value: 399 Ohm
Lead Channel Impedance Value: 456 Ohm
Lead Channel Impedance Value: 646 Ohm
Lead Channel Impedance Value: 665 Ohm
Lead Channel Impedance Value: 779 Ohm
Lead Channel Impedance Value: 893 Ohm
Lead Channel Impedance Value: 931 Ohm
Lead Channel Impedance Value: 950 Ohm
Lead Channel Pacing Threshold Amplitude: 0.875 V
Lead Channel Pacing Threshold Amplitude: 0.875 V
Lead Channel Pacing Threshold Amplitude: 1.875 V
Lead Channel Pacing Threshold Pulse Width: 0.4 ms
Lead Channel Pacing Threshold Pulse Width: 0.4 ms
Lead Channel Pacing Threshold Pulse Width: 0.8 ms
Lead Channel Sensing Intrinsic Amplitude: 11.875 mV
Lead Channel Sensing Intrinsic Amplitude: 11.875 mV
Lead Channel Sensing Intrinsic Amplitude: 3.75 mV
Lead Channel Sensing Intrinsic Amplitude: 3.75 mV
Lead Channel Setting Pacing Amplitude: 2 V
Lead Channel Setting Pacing Amplitude: 2 V
Lead Channel Setting Pacing Amplitude: 2.5 V
Lead Channel Setting Pacing Pulse Width: 0.4 ms
Lead Channel Setting Pacing Pulse Width: 0.8 ms
Lead Channel Setting Sensing Sensitivity: 0.3 mV
Zone Setting Status: 755011
Zone Setting Status: 755011

## 2022-03-19 ENCOUNTER — Other Ambulatory Visit: Payer: Self-pay

## 2022-03-19 DIAGNOSIS — Z853 Personal history of malignant neoplasm of breast: Secondary | ICD-10-CM

## 2022-03-22 ENCOUNTER — Inpatient Hospital Stay: Payer: No Typology Code available for payment source | Attending: Hematology & Oncology

## 2022-03-22 NOTE — Progress Notes (Signed)
[facility] CSW Progress Note  Clinical Education officer, museum contacted patient by phone to discuss Advance Directives per the referral from Lottie Dawson, NP.  CSW provided education regarding AD.  Scheduled appointment with patient for Wednesday, March 6, at 1pm at [facility] to complete.  Provided CSW contact information.    Rodman Pickle Sukhman Martine, LCSW

## 2022-03-23 ENCOUNTER — Ambulatory Visit: Payer: No Typology Code available for payment source | Admitting: Family Medicine

## 2022-03-23 ENCOUNTER — Encounter: Payer: Self-pay | Admitting: Family Medicine

## 2022-03-23 VITALS — BP 138/82 | HR 73 | Resp 18 | Ht 64.0 in | Wt 219.8 lb

## 2022-03-23 DIAGNOSIS — E119 Type 2 diabetes mellitus without complications: Secondary | ICD-10-CM | POA: Diagnosis not present

## 2022-03-23 DIAGNOSIS — Z0000 Encounter for general adult medical examination without abnormal findings: Secondary | ICD-10-CM | POA: Diagnosis not present

## 2022-03-23 DIAGNOSIS — R42 Dizziness and giddiness: Secondary | ICD-10-CM | POA: Diagnosis not present

## 2022-03-23 DIAGNOSIS — M8580 Other specified disorders of bone density and structure, unspecified site: Secondary | ICD-10-CM

## 2022-03-23 DIAGNOSIS — E782 Mixed hyperlipidemia: Secondary | ICD-10-CM | POA: Diagnosis not present

## 2022-03-23 DIAGNOSIS — I5042 Chronic combined systolic (congestive) and diastolic (congestive) heart failure: Secondary | ICD-10-CM

## 2022-03-23 DIAGNOSIS — I10 Essential (primary) hypertension: Secondary | ICD-10-CM

## 2022-03-23 DIAGNOSIS — H269 Unspecified cataract: Secondary | ICD-10-CM | POA: Insufficient documentation

## 2022-03-23 MED ORDER — MECLIZINE HCL 12.5 MG PO TABS: 12.5000 mg | ORAL_TABLET | Freq: Three times a day (TID) | ORAL | 3 refills | Status: AC | PRN

## 2022-03-23 MED ORDER — SCOPOLAMINE 1 MG/3DAYS TD PT72: 1.0000 | MEDICATED_PATCH | TRANSDERMAL | 0 refills | Status: DC

## 2022-03-23 NOTE — Progress Notes (Signed)
Subjective:   By signing my name below, I, Kellie Simmering, attest that this documentation has been prepared under the direction and in the presence of Mosie Lukes, MD., 03/23/2022.   Patient ID: Brenda Marsh, female    DOB: 1945-02-13, 77 y.o.   MRN: [id number]  No chief complaint on file.  HPI Patient is in today for a comprehensive physical exam and follow up on chronic medical concerns. She denies CP/palpitations/SOB/HA/congestion/fever/ chills/GI or GU symptoms.   Arthralgia Patient is doing well today, but reports that she has been experiencing arthritic aches and pains in her joints, predominantly in both knees and thumbs. She is wondering if it would be appropriate to take the supplement glucosamine.  Cataracts She has upcoming cataracts surgery which will be performed by her opthalmologic Dr. Rex Kras.  Family History Her 84 yo daughter recently passed away from Parkinson's disease in 12/2021.  Grief She is handling the loss of her daughter well and chooses not to receive counseling/therapy at this time.  Right Knee Pain Patient reports that she experiences intermittent pain in her right knee, which feels like bee stings.  Supplements She has started taking vitamin D 2000 IU daily.  Vertigo She reports that she is experiencing intermittent episodes of vertigo, sometimes occurring daily. She has been managing this with Meclizine 12.5 mg tid prn. She is requesting a prescription for Scopolamine patches to use on her upcoming cruise.  Past Medical History:  Diagnosis Date   AICD (automatic cardioverter/defibrillator) present    Allergic state 05/17/2016   Dr Ernst Bowler asthma and allergy specialist.   Allergy    Arthritis    left knee pain, MRI in 2008-tricompartmental  degenerate changes, mucoid degeneration of ACL, post horn meniscal tear and ant horn meniscal tear   Asthma    Breast cancer in female [facility]) 2000   left; S/P lumpectomy, radiation and  chemotherapy   CHF (congestive heart failure) ([place])    Colon polyp    unclear pathology   Hyperlipidemia    Hypertension    LBBB (left bundle branch block)    Low back pain    left L3-4 injected   Lymphedema of left arm    Nonischemic cardiomyopathy ([place])    Presence of permanent cardiac pacemaker    Preventative health care 05/18/2016   Travel sickness 05/12/2015   Unspecified constipation 04/22/2012   Vertigo     Past Surgical History:  Procedure Laterality Date   APPENDECTOMY  2009   Dr. Lucia Gaskins   BI-VENTRICULAR IMPLANTABLE CARDIOVERTER DEFIBRILLATOR N/A 11/16/2013   Procedure: BI-VENTRICULAR IMPLANTABLE CARDIOVERTER DEFIBRILLATOR  (CRT-D);  Surgeon: Evans Lance, MD;  Location: [facility] CATH LAB;  Service: Cardiovascular;  Laterality: N/A;   BI-VENTRICULAR IMPLANTABLE CARDIOVERTER DEFIBRILLATOR  (CRT-D)  11/16/13   MDT CRTD implanted by Dr Lovena Le for NICM, CHF, and LBBB   BIV ICD GENERATOR CHANGEOUT N/A 06/19/2020   Procedure: BIV ICD GENERATOR CHANGEOUT;  Surgeon: Evans Lance, MD;  Location: [place] CV LAB;  Service: Cardiovascular;  Laterality: N/A;   BREAST BIOPSY Left 2000   BREAST LUMPECTOMY Left 2000   CARDIAC CATHETERIZATION  ~ 2010   "no blockage"   CARPAL TUNNEL RELEASE Bilateral 2007   Dr. Daylene Katayama   COLONOSCOPY     KNEE ARTHROSCOPY Right    POLYPECTOMY     TONSILLECTOMY     TRIGGER FINGER RELEASE Bilateral 2007   VAGINAL HYSTERECTOMY     Paritial    Family History  Problem Relation Age of  Onset   Heart attack Mother 49   Diabetes Sister    Diabetes Brother    Coronary artery disease Other        Female first degree relative <60   Hyperlipidemia Other    Hypertension Other    Cancer Other        prostate, 1st degree relative <50   Coronary artery disease Maternal Grandmother    Colon cancer Maternal Aunt    Cancer Daughter    Allergic rhinitis Neg Hx    Angioedema Neg Hx    Asthma Neg Hx    Eczema Neg Hx    Immunodeficiency Neg Hx    Urticaria Neg  Hx    Stomach cancer Neg Hx    Rectal cancer Neg Hx    Esophageal cancer Neg Hx    Liver cancer Neg Hx    Colon polyps Neg Hx     Social History   Socioeconomic History   Marital status: Widowed    Spouse name: Not on file   Number of children: 2   Years of education: 14   Highest education level: Not on file  Occupational History    Employer: DISABLED  Tobacco Use   Smoking status: Former    Packs/day: 0.10    Years: 2.00    Total pack years: 0.20    Types: Cigarettes    Quit date: 01/18/1970    Years since quitting: 52.2   Smokeless tobacco: Never   Tobacco comments:    "smoked maybe 1 pack/month when I did smoke  Vaping Use   Vaping Use: Never used  Substance and Sexual Activity   Alcohol use: No   Drug use: No   Sexual activity: Never    Birth control/protection: Surgical, Post-menopausal  Other Topics Concern   Not on file  Social History Narrative   Widow - husband died in 03/30/2003   Occupation - retired from working in Radiation protection practitioner   2 daughter - on lives in [place], other in [place]   Remote history of tobacco but none in 40 years   Caffeine- coffee, 1 cup daily; 1 energy drink daily (V8)   Social Determinants of Health   Financial Resource Strain: [place]  (08/25/2020)   Overall Financial Resource Strain (CARDIA)    Difficulty of Paying Living Expenses: Not hard at all  Food Insecurity: No Food Insecurity (08/25/2020)   Hunger Vital Sign    Worried About Running Out of Food in the Last Year: Never true    [place] in the Last Year: Never true  Transportation Needs: No Transportation Needs (08/25/2020)   PRAPARE - Hydrologist (Medical): No    Lack of Transportation (Non-Medical): No  Physical Activity: Inactive (08/25/2020)   Exercise Vital Sign    Days of Exercise per Week: 0 days    Minutes of Exercise per Session: 0 min  Stress: No Stress Concern Present (08/25/2020)   [place]    Feeling of Stress : Not at all  Social Connections: Moderately Isolated (08/25/2020)   Social Connection and Isolation Panel [NHANES]    Frequency of Communication with Friends and Family: More than three times a week    Frequency of Social Gatherings with Friends and Family: More than three times a week    Attends Religious Services: More than 4 times per year    Active Member of Clubs or Organizations: No  Attends Archivist Meetings: Never    Marital Status: Widowed  Intimate Partner Violence: Not At Risk (08/25/2020)   Humiliation, Afraid, Rape, and Kick questionnaire    Fear of Current or Ex-Partner: No    Emotionally Abused: No    Physically Abused: No    Sexually Abused: No    Outpatient Medications Prior to Visit  Medication Sig Dispense Refill   Ascorbic Acid (VITAMIN C PO) Take 1 tablet by mouth every evening.     bisoprolol (ZEBETA) 5 MG tablet TAKE 1/2 TABLET (2.5 MG TOTAL) BY MOUTH DAILY 45 tablet PRN   Cholecalciferol (VITAMIN D3) 50 MCG (2000 UT) TABS Take 6,000 Units by mouth every evening.     COVID-19 mRNA vaccine 2023-2024 (COMIRNATY) syringe Inject into the muscle. 0.3 mL 0   fexofenadine (ALLEGRA) 180 MG tablet Take 180 mg by mouth daily.     meclizine (ANTIVERT) 12.5 MG tablet Take 1 tablet (12.5 mg total) by mouth 3 (three) times daily as needed for dizziness. 90 tablet 3   Multiple Vitamin (MULTIVITAMIN WITH MINERALS) TABS tablet Take 1 tablet by mouth every evening.     spironolactone (ALDACTONE) 25 MG tablet Take 0.5 tablets (12.5 mg total) by mouth daily. 45 tablet 3   vitamin C (ASCORBIC ACID) 500 MG tablet Take 500 mg by mouth every evening.     Vitamin E 670 MG (1000 UT) CAPS Take 1,000 Units by mouth 2 (two) times a week. In the evening     Zinc 50 MG TABS Take 50 mg by mouth every other day. In the evening     No facility-administered medications prior to visit.    Allergies  Allergen Reactions    Atorvastatin Other (See Comments)    Myalgia, weakness   Naproxen Sodium Shortness Of Breath    Difficulty breathing   Shellfish Allergy Shortness Of Breath    Cant breath    Contrast Media [Iodinated Contrast Media] Nausea And Vomiting   Crestor [Rosuvastatin] Other (See Comments)    Myalgia, weakness   Metrizamide Nausea And Vomiting   Lisinopril Swelling and Cough    Difficulty breathing   Penicillins Rash    Rash on arms   Simvastatin Other (See Comments)    Myalgia, weakness    Review of Systems  Constitutional:  Negative for chills and fever.  HENT:  Negative for congestion.   Respiratory:  Negative for shortness of breath.   Cardiovascular:  Negative for chest pain and palpitations.  Gastrointestinal:  Negative for abdominal pain, blood in stool, constipation, diarrhea, nausea and vomiting.  Genitourinary:  Negative for dysuria, frequency, hematuria and urgency.  Musculoskeletal:  Positive for joint pain (bilaterally in knees and thumbs.).       (+) right knee pain.  Skin:           Neurological:  Negative for headaches.       Objective:    Physical Exam Constitutional:      General: She is not in acute distress.    Appearance: Normal appearance. She is obese. She is not ill-appearing.  HENT:     Head: Normocephalic and atraumatic.     Right Ear: Tympanic membrane, ear canal and external ear normal.     Left Ear: Tympanic membrane, ear canal and external ear normal.     Nose: Nose normal.     Mouth/Throat:     Mouth: Mucous membranes are moist.     Pharynx: Oropharynx is clear.  Eyes:  General:        Right eye: No discharge.        Left eye: No discharge.     Extraocular Movements: Extraocular movements intact.     Right eye: No nystagmus.     Left eye: No nystagmus.     Pupils: Pupils are equal, round, and reactive to light.  Neck:     Vascular: No carotid bruit.  Cardiovascular:     Rate and Rhythm: Normal rate and regular rhythm.     Pulses:  Normal pulses.     Heart sounds: Normal heart sounds. No murmur heard.    No gallop.  Pulmonary:     Effort: Pulmonary effort is normal. No respiratory distress.     Breath sounds: Normal breath sounds. No wheezing or rales.  Abdominal:     General: Bowel sounds are normal.     Palpations: Abdomen is soft.     Tenderness: There is no abdominal tenderness. There is no guarding.  Musculoskeletal:        General: Normal range of motion.     Cervical back: Normal range of motion.     Right lower leg: No edema.     Left lower leg: No edema.     Comments: Muscle strength 5/5 on upper and lower extremities.   Lymphadenopathy:     Cervical: No cervical adenopathy.  Skin:    General: Skin is warm and dry.  Neurological:     Mental Status: She is alert and oriented to person, place, and time.     Sensory: Sensation is intact.     Motor: Motor function is intact.     Coordination: Coordination is intact.     Deep Tendon Reflexes:     Reflex Scores:      Patellar reflexes are 1+ on the right side and 2+ on the left side. Psychiatric:        Mood and Affect: Mood normal.        Behavior: Behavior normal.        Judgment: Judgment normal.     There were no vitals taken for this visit. Wt Readings from Last 3 Encounters:  01/26/22 215 lb (97.5 kg)  10/29/21 217 lb (98.4 kg)  10/22/21 216 lb (98 kg)    Diabetic Foot Exam - Simple   No data filed    Lab Results  Component Value Date   WBC 6.3 10/29/2021   HGB 12.8 10/29/2021   HCT 38.7 10/29/2021   PLT 214 10/29/2021   GLUCOSE 82 10/29/2021   CHOL 309 (H) 09/15/2021   TRIG 141.0 09/15/2021   HDL 48.20 09/15/2021   LDLCALC 233 (H) 09/15/2021   ALT 20 10/29/2021   AST 25 10/29/2021   NA 139 10/29/2021   K 4.2 10/29/2021   CL 105 10/29/2021   CREATININE 0.91 10/29/2021   BUN 9 10/29/2021   CO2 25 10/29/2021   TSH 0.49 09/15/2021   HGBA1C 6.0 09/15/2021   MICROALBUR <0.7 05/10/2016    Lab Results  Component Value  Date   TSH 0.49 09/15/2021   Lab Results  Component Value Date   WBC 6.3 10/29/2021   HGB 12.8 10/29/2021   HCT 38.7 10/29/2021   MCV 93.7 10/29/2021   PLT 214 10/29/2021   Lab Results  Component Value Date   NA 139 10/29/2021   K 4.2 10/29/2021   CO2 25 10/29/2021   GLUCOSE 82 10/29/2021   BUN 9 10/29/2021   CREATININE 0.91  10/29/2021   BILITOT 0.4 10/29/2021   ALKPHOS 71 10/29/2021   AST 25 10/29/2021   ALT 20 10/29/2021   PROT 8.4 (H) 10/29/2021   ALBUMIN 4.5 10/29/2021   CALCIUM 10.5 (H) 10/29/2021   ANIONGAP 9 10/29/2021   EGFR 64 06/10/2020   GFR 69.59 09/15/2021   Lab Results  Component Value Date   CHOL 309 (H) 09/15/2021   Lab Results  Component Value Date   HDL 48.20 09/15/2021   Lab Results  Component Value Date   LDLCALC 233 (H) 09/15/2021   Lab Results  Component Value Date   TRIG 141.0 09/15/2021   Lab Results  Component Value Date   CHOLHDL 6 09/15/2021   Lab Results  Component Value Date   HGBA1C 6.0 09/15/2021      Assessment & Plan:  Colonoscopy: Last completed on 01/14/2017. Impression: -Three 2 to 4 mm polyps in the rectum, in the descending colon and in the ascending colon, removed with a cold snare. Resected and retrieved. -Diverticulosis. -The examination was otherwise normal on direct and retroflexion views. Repeat in 5 years.  DEXA: Last completed on 11/11/2020. The BMD measured at Femur Neck Right is 0.818 g/cm2 with a T-score of -1.6. This patient is considered OSTEOPENIC according to [facility]) criteria. L3 & L4 were excluded due to degenerative changes. Compared with the prior study on, 05/24/2016 the BMD of the total mean shows no statistically significant change. The scan quality is good. Repeat in 2-5 years.  Mammogram: Last completed on 11/13/2021 with no mammographic evidence of malignancy. Repeat in 1-2 years.  Pap Smear: Last completed on 11/19/2019 with normal results. Repeat pap smear is not  recommended due to findings and patient's age.  Advanced Directives: Encouraged patient to complete advanced care planning documents.  Healthy Lifestyle: Encouraged 6-8 hours of sleep, heart healthy diet such as MIND and PURE, 60-80 oz of non-alcohol/non-caffeinated fluids, and minimum of 4000 steps daily.  Immunizations: Reviewed patient's immunization history and encouraged Flu, Prevnar 98, RSV, Shingles, and Tetanus vaccinations.  Labs: Routine blood work ordered.  Vertigo: Meclizine 12.5 refilled and Scopolamine patches 1 mg/3 days  prescribed. Referral placed to neurology.  Problem List Items Addressed This Visit   None  No orders of the defined types were placed in this encounter.  I, Kellie Simmering, personally preformed the services described in this documentation.  All medical record entries made by the scribe were at my direction and in my presence.  I have reviewed the chart and discharge instructions (if applicable) and agree that the record reflects my personal performance and is accurate and complete. 03/23/2022  I,Mohammed Iqbal,acting as a scribe for [name], MD.,have documented all relevant documentation on the behalf of [name], MD,as directed by  [name], MD while in the presence of [name], MD.  Kellie Simmering

## 2022-03-23 NOTE — Patient Instructions (Addendum)
Tetanus is due at pharmacy Shingrix is the new shingles shot, 2 shots over 2-6 months, confirm coverage with insurance and document, then can return here for shots with nurse appt or at pharmacy  Prevnar 20 once Annual flu and covid RSV, Respiratory Syncitial Virus vaccine, Arexvy vaccine at pharmacy  6-8 hours of sleep 60-80 ounces of fluids, caffeine requires extra water 4000 steps, 8000 steps Exercise, chair yoga MIND diet (mediterranean and the DASH)    Preventive Care 65 Years and Older, Female Preventive care refers to lifestyle choices and visits with your health care provider that can promote health and wellness. Preventive care visits are also called wellness exams. What can I expect for my preventive care visit? Counseling Your health care provider may ask you questions about your: Medical history, including: Past medical problems. Family medical history. Pregnancy and menstrual history. History of falls. Current health, including: Memory and ability to understand (cognition). Emotional well-being. Home life and relationship well-being. Sexual activity and sexual health. Lifestyle, including: Alcohol, nicotine or tobacco, and drug use. Access to firearms. Diet, exercise, and sleep habits. Work and work Statistician. Sunscreen use. Safety issues such as seatbelt and bike helmet use. Physical exam Your health care provider will check your: Height and weight. These may be used to calculate your BMI (body mass index). BMI is a measurement that tells if you are at a healthy weight. Waist circumference. This measures the distance around your waistline. This measurement also tells if you are at a healthy weight and may help predict your risk of certain diseases, such as type 2 diabetes and high blood pressure. Heart rate and blood pressure. Body temperature. Skin for abnormal spots. What immunizations do I need?  Vaccines are usually given at various ages, according to a  schedule. Your health care provider will recommend vaccines for you based on your age, medical history, and lifestyle or other factors, such as travel or where you work. What tests do I need? Screening Your health care provider may recommend screening tests for certain conditions. This may include: Lipid and cholesterol levels. Hepatitis C test. Hepatitis B test. HIV (human immunodeficiency virus) test. STI (sexually transmitted infection) testing, if you are at risk. Lung cancer screening. Colorectal cancer screening. Diabetes screening. This is done by checking your blood sugar (glucose) after you have not eaten for a while (fasting). Mammogram. Talk with your health care provider about how often you should have regular mammograms. BRCA-related cancer screening. This may be done if you have a family history of breast, ovarian, tubal, or peritoneal cancers. Bone density scan. This is done to screen for osteoporosis. Talk with your health care provider about your test results, treatment options, and if necessary, the need for more tests. Follow these instructions at home: Eating and drinking  Eat a diet that includes fresh fruits and vegetables, whole grains, lean protein, and low-fat dairy products. Limit your intake of foods with high amounts of sugar, saturated fats, and salt. Take vitamin and mineral supplements as recommended by your health care provider. Do not drink alcohol if your health care provider tells you not to drink. If you drink alcohol: Limit how much you have to 0-1 drink a day. Know how much alcohol is in your drink. In the [place] one drink equals one 12 oz bottle of beer (355 mL), one 5 oz glass of wine (148 mL), or one 1 oz glass of hard liquor (44 mL). Lifestyle Brush your teeth every morning and night with fluoride toothpaste.  Floss one time each day. Exercise for at least 30 minutes 5 or more days each week. Do not use any products that contain nicotine or  tobacco. These products include cigarettes, chewing tobacco, and vaping devices, such as e-cigarettes. If you need help quitting, ask your health care provider. Do not use drugs. If you are sexually active, practice safe sex. Use a condom or other form of protection in order to prevent STIs. Take aspirin only as told by your health care provider. Make sure that you understand how much to take and what form to take. Work with your health care provider to find out whether it is safe and beneficial for you to take aspirin daily. Ask your health care provider if you need to take a cholesterol-lowering medicine (statin). Find healthy ways to manage stress, such as: Meditation, yoga, or listening to music. Journaling. Talking to a trusted person. Spending time with friends and family. Minimize exposure to UV radiation to reduce your risk of skin cancer. Safety Always wear your seat belt while driving or riding in a vehicle. Do not drive: If you have been drinking alcohol. Do not ride with someone who has been drinking. When you are tired or distracted. While texting. If you have been using any mind-altering substances or drugs. Wear a helmet and other protective equipment during sports activities. If you have firearms in your house, make sure you follow all gun safety procedures. What's next? Visit your health care provider once a year for an annual wellness visit. Ask your health care provider how often you should have your eyes and teeth checked. Stay up to date on all vaccines. This information is not intended to replace advice given to you by your health care provider. Make sure you discuss any questions you have with your health care provider. Document Revised: 07/02/2020 Document Reviewed: 07/02/2020 Elsevier Patient Education  [place].

## 2022-03-23 NOTE — Assessment & Plan Note (Signed)
Will be removed soon. Dr Bradley Ferris

## 2022-03-24 ENCOUNTER — Inpatient Hospital Stay: Payer: No Typology Code available for payment source | Admitting: Licensed Clinical Social Worker

## 2022-03-24 DIAGNOSIS — M8580 Other specified disorders of bone density and structure, unspecified site: Secondary | ICD-10-CM | POA: Insufficient documentation

## 2022-03-24 LAB — COMPREHENSIVE METABOLIC PANEL
ALT: 18 U/L (ref 0–35)
AST: 22 U/L (ref 0–37)
Albumin: 4.1 g/dL (ref 3.5–5.2)
Alkaline Phosphatase: 77 U/L (ref 39–117)
BUN: 13 mg/dL (ref 6–23)
CO2: 26 mEq/L (ref 19–32)
Calcium: 10.2 mg/dL (ref 8.4–10.5)
Chloride: 102 mEq/L (ref 96–112)
Creatinine, Ser: 0.83 mg/dL (ref 0.40–1.20)
GFR: 68.34 mL/min (ref >60.00)
Glucose, Bld: 74 mg/dL (ref 70–99)
Potassium: 4.1 mEq/L (ref 3.5–5.1)
Sodium: 138 mEq/L (ref 135–145)
Total Bilirubin: 0.3 mg/dL (ref 0.2–1.2)
Total Protein: 7.7 g/dL (ref 6.0–8.3)

## 2022-03-24 LAB — VITAMIN D 25 HYDROXY (VIT D DEFICIENCY, FRACTURES): VITD: 61.03 ng/mL (ref 30.00–100.00)

## 2022-03-24 LAB — CBC WITH DIFFERENTIAL/PLATELET
Basophils Absolute: 0.1 10*3/uL (ref 0.0–0.1)
Basophils Relative: 1.3 % (ref 0.0–3.0)
Eosinophils Absolute: 0.4 10*3/uL (ref 0.0–0.7)
Eosinophils Relative: 5.6 % — ABNORMAL HIGH (ref 0.0–5.0)
HCT: 39.3 % (ref 36.0–46.0)
Hemoglobin: 13.2 g/dL (ref 12.0–15.0)
Lymphocytes Relative: 43.3 % (ref 12.0–46.0)
Lymphs Abs: 2.9 10*3/uL (ref 0.7–4.0)
MCHC: 33.5 g/dL (ref 30.0–36.0)
MCV: 96.2 fl (ref 78.0–100.0)
Monocytes Absolute: 0.5 10*3/uL (ref 0.1–1.0)
Monocytes Relative: 7.3 % (ref 3.0–12.0)
Neutro Abs: 2.9 10*3/uL (ref 1.4–7.7)
Neutrophils Relative %: 42.5 % — ABNORMAL LOW (ref 43.0–77.0)
Platelets: 241 10*3/uL (ref 150.0–400.0)
RBC: 4.09 Mil/uL (ref 3.87–5.11)
RDW: 14.1 % (ref 11.5–15.5)
WBC: 6.7 10*3/uL (ref 4.0–10.5)

## 2022-03-24 LAB — LIPID PANEL
Cholesterol: 321 mg/dL — ABNORMAL HIGH (ref 0–200)
HDL: 45 mg/dL (ref >39.00)
LDL Cholesterol: 240 mg/dL — ABNORMAL HIGH (ref 0–99)
NonHDL: 276.31
Total CHOL/HDL Ratio: 7
Triglycerides: 184 mg/dL — ABNORMAL HIGH (ref 0.0–149.0)
VLDL: 36.8 mg/dL (ref 0.0–40.0)

## 2022-03-24 LAB — TSH: TSH: 1.68 u[IU]/mL (ref 0.35–5.50)

## 2022-03-24 NOTE — Assessment & Plan Note (Signed)
No recent exacerbations.  

## 2022-03-24 NOTE — Assessment & Plan Note (Signed)
Encouraged to get adequate exercise, calcium and vitamin d intake 

## 2022-03-24 NOTE — Assessment & Plan Note (Signed)
hgba1c acceptable, minimize simple carbs. Increase exercise as tolerated. Continue current meds 

## 2022-03-24 NOTE — Progress Notes (Signed)
[place] [facility]  Patient presented to [facility] to review and complete healthcare advance directives.  Clinical Social Worker met with patient.  The patient designated Librarian, academic as their primary healthcare agent and Sharen Hones as their secondary agent.  Patient also completed healthcare living will.    Documents were notarized and copies made for patient/family. Clinical Social Worker will send documents to medical records to be scanned into patient's chart. Clinical Social Worker encouraged patient/family to contact with any additional questions or concerns.   Margaree Mackintosh, LCSW Clinical Social Worker [facility]

## 2022-03-24 NOTE — Assessment & Plan Note (Signed)
Patient encouraged to maintain heart healthy diet, regular exercise, adequate sleep. Consider daily probiotics. Take medications as prescribed. Labs ordered and reviewed. Given and reviewed copy of ACP documents from [place] Secretary of State and encouraged to complete and return 

## 2022-03-25 ENCOUNTER — Encounter: Payer: Self-pay | Admitting: *Deleted

## 2022-03-25 NOTE — Progress Notes (Signed)
My chart message sent as well

## 2022-04-16 NOTE — Progress Notes (Signed)
Remote ICD transmission.   

## 2022-05-14 ENCOUNTER — Encounter: Payer: Self-pay | Admitting: Diagnostic Neuroimaging

## 2022-05-14 ENCOUNTER — Ambulatory Visit (INDEPENDENT_AMBULATORY_CARE_PROVIDER_SITE_OTHER): Payer: No Typology Code available for payment source | Admitting: Diagnostic Neuroimaging

## 2022-05-14 VITALS — BP 128/68 | HR 74 | Ht 65.0 in | Wt 224.8 lb

## 2022-05-14 DIAGNOSIS — R200 Anesthesia of skin: Secondary | ICD-10-CM | POA: Diagnosis not present

## 2022-05-14 DIAGNOSIS — R42 Dizziness and giddiness: Secondary | ICD-10-CM | POA: Diagnosis not present

## 2022-05-14 NOTE — Progress Notes (Signed)
[facility]  PATIENT: Brenda Marsh DOB: 1945-05-24  REFERRING CLINICIAN: Bradd Canary, MD  HISTORY FROM: patient  REASON FOR VISIT: new consult    HISTORICAL  CHIEF COMPLAINT:  Chief Complaint  Patient presents with   New Patient (Initial Visit)    Pt in 6 pt here for Neuropathy pain  Pt states pins and needles feeling in right leg Pt states in right foot burning and tingling in big toe Pt states questions about vertigo     HISTORY OF PRESENT ILLNESS:   UPDATE (05/14/22, VRP): 77 year old female with pain / stinging in the feet x 1 month. Remote chemo neuropathy around 2000, but that resolved.  Still having issues with intermittent vertigo.  Taking meclizine almost on a daily basis.  PRIOR HPI (06/11/16): 77 year old female here for evaluation of vertigo. Patient had first vertigo attack in 2015. Second attack was in November 2016. She describes severe room spinning sensation associated with nausea. Symptoms would last for up to 5-10 minutes at a time. Patient has had excessive workup in the past. Since that time she's had intermittent episodes at least once per week, with milder spinning vertigo sensation. She also has a daily low-level pressure sensation with headache. No ringing in ears. Symptoms slightly worse in the evening, but sometimes occur early in the morning.   REVIEW OF SYSTEMS: Full 14 system review of systems performed and negative with exception of: as per HPI.  ALLERGIES: Allergies  Allergen Reactions   Atorvastatin Other (See Comments)    Myalgia, weakness   Naproxen Sodium Shortness Of Breath    Difficulty breathing   Shellfish Allergy Shortness Of Breath    Cant breath    Contrast Media [Iodinated Contrast Media] Nausea And Vomiting   Crestor [Rosuvastatin] Other (See Comments)    Myalgia, weakness   Metrizamide Nausea And Vomiting   Lisinopril Swelling and Cough    Difficulty breathing   Penicillins Rash    Rash on arms    Simvastatin Other (See Comments)    Myalgia, weakness    HOME MEDICATIONS: Outpatient Medications Prior to Visit  Medication Sig Dispense Refill   Ascorbic Acid (VITAMIN C PO) Take 1 tablet by mouth every evening.     bisoprolol (ZEBETA) 5 MG tablet TAKE 1/2 TABLET (2.5 MG TOTAL) BY MOUTH DAILY 45 tablet PRN   Cholecalciferol (VITAMIN D3) 50 MCG (2000 UT) TABS Take 6,000 Units by mouth every evening.     fexofenadine (ALLEGRA) 180 MG tablet Take 180 mg by mouth daily.     meclizine (ANTIVERT) 12.5 MG tablet Take 1 tablet (12.5 mg total) by mouth 3 (three) times daily as needed for dizziness. 90 tablet 3   Multiple Vitamin (MULTIVITAMIN WITH MINERALS) TABS tablet Take 1 tablet by mouth every evening.     scopolamine (TRANSDERM-SCOP) 1 MG/3DAYS Place 1 patch (1.5 mg total) onto the skin every 3 (three) days. 3 patch 0   spironolactone (ALDACTONE) 25 MG tablet Take 0.5 tablets (12.5 mg total) by mouth daily. 45 tablet 3   vitamin C (ASCORBIC ACID) 500 MG tablet Take 500 mg by mouth every evening.     Zinc 50 MG TABS Take 50 mg by mouth every other day. In the evening     No facility-administered medications prior to visit.    PAST MEDICAL HISTORY: Past Medical History:  Diagnosis Date   AICD (automatic cardioverter/defibrillator) present    Allergic state 05/17/2016   Dr Dellis Anes asthma and allergy specialist.  Allergy    Arthritis    left knee pain, MRI in 2008-tricompartmental  degenerate changes, mucoid degeneration of ACL, post horn meniscal tear and ant horn meniscal tear   Asthma    Breast cancer in female [facility]) 2000   left; S/P lumpectomy, radiation and chemotherapy   CHF (congestive heart failure) (HCC)    Colon polyp    unclear pathology   Hyperlipidemia    Hypertension    LBBB (left bundle branch block)    Low back pain    left L3-4 injected   Lymphedema of left arm    Nonischemic cardiomyopathy (HCC)    Presence of permanent cardiac pacemaker    Preventative  health care 05/18/2016   Travel sickness 05/12/2015   Unspecified constipation 04/22/2012   Vertigo     PAST SURGICAL HISTORY: Past Surgical History:  Procedure Laterality Date   APPENDECTOMY  2009   Dr. Ezzard Standing   BI-VENTRICULAR IMPLANTABLE CARDIOVERTER DEFIBRILLATOR N/A 11/16/2013   Procedure: BI-VENTRICULAR IMPLANTABLE CARDIOVERTER DEFIBRILLATOR  (CRT-D);  Surgeon: Marinus Maw, MD;  Location: [facility] CATH LAB;  Service: Cardiovascular;  Laterality: N/A;   BI-VENTRICULAR IMPLANTABLE CARDIOVERTER DEFIBRILLATOR  (CRT-D)  11/16/13   MDT CRTD implanted by Dr Ladona Ridgel for NICM, CHF, and LBBB   BIV ICD GENERATOR CHANGEOUT N/A 06/19/2020   Procedure: BIV ICD GENERATOR CHANGEOUT;  Surgeon: Marinus Maw, MD;  [facility] INVASIVE CV LAB;  Service: Cardiovascular;  Laterality: N/A;   BREAST BIOPSY Left 2000   BREAST LUMPECTOMY Left 2000   CARDIAC CATHETERIZATION  ~ 2010   "no blockage"   CARPAL TUNNEL RELEASE Bilateral 2007   Dr. Teressa Senter   COLONOSCOPY     KNEE ARTHROSCOPY Right    POLYPECTOMY     TONSILLECTOMY     TRIGGER FINGER RELEASE Bilateral 2007   VAGINAL HYSTERECTOMY     Paritial    FAMILY HISTORY: Family History  Problem Relation Age of Onset   Heart attack Mother 3   Diabetes Sister    Diabetes Brother    Colon cancer Maternal Aunt    Learning disabilities Paternal Uncle    Coronary artery disease Maternal Grandmother    Cancer Daughter        breast cancer   Parkinson's disease Daughter    Thyroid disease Daughter    Hypertension Daughter    Diabetes Daughter    Coronary artery disease Other        Female first degree relative <60   Hyperlipidemia Other    Hypertension Other    Cancer Other        prostate, 1st degree relative <50   Allergic rhinitis Neg Hx    Angioedema Neg Hx    Asthma Neg Hx    Eczema Neg Hx    Immunodeficiency Neg Hx    Urticaria Neg Hx    Stomach cancer Neg Hx    Rectal cancer Neg Hx    Esophageal cancer Neg Hx    Liver cancer Neg Hx     Colon polyps Neg Hx    Neuropathy Neg Hx     SOCIAL HISTORY:  Social History   Socioeconomic History   Marital status: Widowed    Spouse name: Not on file   Number of children: 2   Years of education: 14   Highest education level: Not on file  Occupational History    Employer: DISABLED  Tobacco Use   Smoking status: Former    Packs/day: 0.10    Years: 2.00  Additional pack years: 0.00    Total pack years: 0.20    Types: Cigarettes    Quit date: 01/18/1970    Years since quitting: 52.3   Smokeless tobacco: Never   Tobacco comments:    "smoked maybe 1 pack/month when I did smoke  Vaping Use   Vaping Use: Never used  Substance and Sexual Activity   Alcohol use: No   Drug use: No   Sexual activity: Never    Birth control/protection: Surgical, Post-menopausal  Other Topics Concern   Not on file  Social History Narrative   Widow - husband died in 06/14/03   Occupation - retired from working in Museum/gallery curator   2 daughter - on lives in [place], other in [place]   Remote history of tobacco but none in 40 years   Caffeine- coffee, 1 cup daily; 1 energy drink daily (V8)   Social Determinants of Health   Financial Resource Strain: Low Risk  (08/25/2020)   Overall Financial Resource Strain (CARDIA)    Difficulty of Paying Living Expenses: Not hard at all  Food Insecurity: No Food Insecurity (08/25/2020)   Hunger Vital Sign    Worried About Running Out of Food in the Last Year: Never true    Ran Out of Food in the Last Year: Never true  Transportation Needs: No Transportation Needs (08/25/2020)   PRAPARE - Administrator, Civil Service (Medical): No    Lack of Transportation (Non-Medical): No  Physical Activity: Inactive (08/25/2020)   Exercise Vital Sign    Days of Exercise per Week: 0 days    Minutes of Exercise per Session: 0 min  Stress: No Stress Concern Present (08/25/2020)   [employer] of Occupational Health - Occupational Stress Questionnaire    Feeling of  Stress : Not at all  Social Connections: Moderately Isolated (08/25/2020)   Social Connection and Isolation Panel [NHANES]    Frequency of Communication with Friends and Family: More than three times a week    Frequency of Social Gatherings with Friends and Family: More than three times a week    Attends Religious Services: More than 4 times per year    Active Member of [employer] or Organizations: No    Attends Banker Meetings: Never    Marital Status: Widowed  Intimate Partner Violence: Not At Risk (08/25/2020)   Humiliation, Afraid, Rape, and Kick questionnaire    Fear of Current or Ex-Partner: No    Emotionally Abused: No    Physically Abused: No    Sexually Abused: No     PHYSICAL EXAM  GENERAL EXAM/CONSTITUTIONAL: Vitals:  Vitals:   05/14/22 1022  BP: 128/68  Pulse: 74  Weight: 224 lb 12.8 oz (102 kg)  Height: 5\' 5"  (1.651 m)   Body mass index is 37.41 kg/m. No results found.  Patient is in no distress; well developed, nourished and groomed; neck is supple  CARDIOVASCULAR: Examination of carotid arteries is normal; no carotid bruits Regular rate and rhythm, no murmurs Examination of peripheral vascular system by observation and palpation is normal  EYES: Ophthalmoscopic exam of optic discs and posterior segments is normal; no papilledema or hemorrhages  MUSCULOSKELETAL: Gait, strength, tone, movements noted in Neurologic exam below  NEUROLOGIC: MENTAL STATUS:     05/17/2016    1:45 PM  MMSE - Mini Mental State Exam  Orientation to time 5  Orientation to Place 5  Registration 3  Attention/ Calculation 5  Recall 2  Language- name 2  objects 2  Language- repeat 1  Language- follow 3 step command 3  Language- read & follow direction 1  Write a sentence 1  Copy design 1  Total score 29   awake, alert, oriented to person, place and time recent and remote memory intact normal attention and concentration language fluent, comprehension intact,  naming intact,  fund of knowledge appropriate  CRANIAL NERVE:  2nd - no papilledema on fundoscopic exam 2nd, 3rd, 4th, 6th - pupils equal and reactive to light, visual fields full to confrontation, extraocular muscles intact, no nystagmus 5th - facial sensation symmetric 7th - facial strength symmetric 8th - hearing intact 9th - palate elevates symmetrically, uvula midline 11th - shoulder shrug symmetric 12th - tongue protrusion midline  MOTOR:  normal bulk and tone, full strength in the BUE, BLE  SENSORY:  normal and symmetric to light touch, temperature, vibration  COORDINATION:  finger-nose-finger, fine finger movements normal  REFLEXES:  deep tendon reflexes TRACE and symmetric  GAIT/STATION:  narrow based gait    DIAGNOSTIC DATA (LABS, IMAGING, TESTING) - I reviewed patient records, labs, notes, testing and imaging myself where available.  Lab Results  Component Value Date   WBC 6.7 03/23/2022   HGB 13.2 03/23/2022   HCT 39.3 03/23/2022   MCV 96.2 03/23/2022   PLT 241.0 03/23/2022      Component Value Date/Time   NA 138 03/23/2022 1542   NA 140 06/10/2020 0955   NA 143 09/29/2016 1500   K 4.1 03/23/2022 1542   K 3.8 09/29/2016 1500   CL 102 03/23/2022 1542   CL 105 09/29/2016 1500   CO2 26 03/23/2022 1542   CO2 28 09/29/2016 1500   GLUCOSE 74 03/23/2022 1542   GLUCOSE 84 09/29/2016 1500   BUN 13 03/23/2022 1542   BUN 10 06/10/2020 0955   BUN 12 09/29/2016 1500   CREATININE 0.83 03/23/2022 1542   CREATININE 0.91 10/29/2021 1128   CREATININE 0.9 09/29/2016 1500   CALCIUM 10.2 03/23/2022 1542   CALCIUM 9.7 09/29/2016 1500   PROT 7.7 03/23/2022 1542   PROT 7.8 09/29/2016 1500   ALBUMIN 4.1 03/23/2022 1542   ALBUMIN 3.8 09/29/2016 1500   AST 22 03/23/2022 1542   AST 25 10/29/2021 1128   ALT 18 03/23/2022 1542   ALT 20 10/29/2021 1128   ALT 23 09/29/2016 1500   ALKPHOS 77 03/23/2022 1542   ALKPHOS 81 09/29/2016 1500   BILITOT 0.3 03/23/2022 1542    BILITOT 0.4 10/29/2021 1128   GFRNONAA >60 10/29/2021 1128   GFRNONAA >89 07/27/2013 1407   GFRAA >60 10/10/2018 1035   GFRAA >89 07/27/2013 1407   Lab Results  Component Value Date   CHOL 321 (H) 03/23/2022   HDL 45.00 03/23/2022   LDLCALC 240 (H) 03/23/2022   TRIG 184.0 (H) 03/23/2022   CHOLHDL 7 03/23/2022   Lab Results  Component Value Date   HGBA1C 6.0 09/15/2021   Lab Results  Component Value Date   VITAMINB12 477 12/21/2012   Lab Results  Component Value Date   TSH 1.68 03/23/2022    06/26/12 MRI brain  - No acute abnormality and no change from 2005.  11/23/14 CT head  - normal  06/11/16 CT head  - Unremarkable CT appearance of the brain.     ASSESSMENT AND PLAN  77 y.o. year old female here with:     Dx:  1. Vertigo   2. Numbness in feet     PLAN:  INTERMITTENT VERTIGO /  NAUSEA (chronic issue since ~2015; unclear etiology; ddx: peripheral vestibulopathy, vestibular migraine, meniere's dz) - use meclizine as needed for severe vertgo attacks; try not to use on daily basis  NUMBNESS / PAIN (feet, ankles; since March 2024; also prior chemo-neuropathy in 2000) - ddx: chemo-neuropathy, lumbar radiculopathy, plantar fasciitis, arthritis - consider to check B12 level; continue voltaren gel, consider gabapentin for pain mgmt; patient wants to hold off for now  Orders Placed This Encounter  Procedures   Vitamin B12   Return for return to PCP, pending test results.    Suanne Marker, MD 05/14/2022, 11:16 AM Certified in Neurology, Neurophysiology and Neuroimaging  [facility] [address] [place], [place] [address] [phone]

## 2022-05-14 NOTE — Patient Instructions (Addendum)
  INTERMITTENT VERTIGO / NAUSEA (chronic issue since ~2015; unclear etiology; ddx: peripheral vestibulopathy, vestibular migraine, meniere's dz) - use meclizine as needed for severe vertgo attacks; try not to use on daily basis  NUMBNESS / PAIN (feet, ankles; since March 2024; also prior chemo-neuropathy in 2000) - ddx: chemo-neuropathy, lumbar radiculopathy, plantar fasciitis, arthritis - consider to check B12 level; continue voltaren gel, consider gabapentin for pain mgmt; patient wants to hold off for now

## 2022-05-15 LAB — VITAMIN B12: Vitamin B-12: 281 pg/mL (ref 232–1245)

## 2022-06-15 ENCOUNTER — Telehealth: Payer: Self-pay | Admitting: Cardiology

## 2022-06-15 DIAGNOSIS — I5022 Chronic systolic (congestive) heart failure: Secondary | ICD-10-CM

## 2022-06-15 MED ORDER — SPIRONOLACTONE 25 MG PO TABS: 12.5000 mg | ORAL_TABLET | Freq: Every day | ORAL | 1 refills | Status: DC

## 2022-06-15 NOTE — Telephone Encounter (Signed)
*  STAT* If patient is at the pharmacy, call can be transferred to refill team.   1. Which medications need to be refilled? (please list name of each medication and dose if known) spironolactone (ALDACTONE) 25 MG tablet   2. Which pharmacy/location (including street and city if local pharmacy) is medication to be sent to?  [id number] - [place], [place] - [address] AT [address]OSS [address]    3. Do they need a 30 day or 90 day supply? 90 day

## 2022-06-15 NOTE — Telephone Encounter (Signed)
Pt's medication was sent to pt's pharmacy as requested. Confirmation received.  °

## 2022-06-17 ENCOUNTER — Ambulatory Visit (INDEPENDENT_AMBULATORY_CARE_PROVIDER_SITE_OTHER): Payer: No Typology Code available for payment source

## 2022-06-17 DIAGNOSIS — I428 Other cardiomyopathies: Secondary | ICD-10-CM

## 2022-06-17 LAB — CUP PACEART REMOTE DEVICE CHECK
Battery Remaining Longevity: 60 mo
Battery Voltage: 2.98 V
Brady Statistic AP VP Percent: 0.08 %
Brady Statistic AP VS Percent: 0.01 %
Brady Statistic AS VP Percent: 97.82 %
Brady Statistic AS VS Percent: 2.08 %
Brady Statistic RA Percent Paced: 0.1 %
Brady Statistic RV Percent Paced: 92.92 %
Date Time Interrogation Session: 20240530022826
HighPow Impedance: 77 Ohm
Implantable Lead Connection Status: 753985
Implantable Lead Connection Status: 753985
Implantable Lead Connection Status: 753985
Implantable Lead Implant Date: 20151030
Implantable Lead Implant Date: 20151030
Implantable Lead Implant Date: 20151030
Implantable Lead Location: 753858
Implantable Lead Location: 753859
Implantable Lead Location: 753860
Implantable Lead Model: 4598
Implantable Lead Model: 5076
Implantable Lead Model: 6935
Implantable Pulse Generator Implant Date: 20220602
Lead Channel Impedance Value: 1007 Ohm
Lead Channel Impedance Value: 1121 Ohm
Lead Channel Impedance Value: 1311 Ohm
Lead Channel Impedance Value: 1311 Ohm
Lead Channel Impedance Value: 249.375
Lead Channel Impedance Value: 254.534
Lead Channel Impedance Value: 268.078
Lead Channel Impedance Value: 342 Ohm
Lead Channel Impedance Value: 361 Ohm
Lead Channel Impedance Value: 366.603
Lead Channel Impedance Value: 377.863
Lead Channel Impedance Value: 399 Ohm
Lead Channel Impedance Value: 456 Ohm
Lead Channel Impedance Value: 665 Ohm
Lead Channel Impedance Value: 703 Ohm
Lead Channel Impedance Value: 817 Ohm
Lead Channel Impedance Value: 931 Ohm
Lead Channel Impedance Value: 988 Ohm
Lead Channel Pacing Threshold Amplitude: 0.875 V
Lead Channel Pacing Threshold Amplitude: 0.875 V
Lead Channel Pacing Threshold Amplitude: 1.75 V
Lead Channel Pacing Threshold Pulse Width: 0.4 ms
Lead Channel Pacing Threshold Pulse Width: 0.4 ms
Lead Channel Pacing Threshold Pulse Width: 0.8 ms
Lead Channel Sensing Intrinsic Amplitude: 19.625 mV
Lead Channel Sensing Intrinsic Amplitude: 19.625 mV
Lead Channel Sensing Intrinsic Amplitude: 3.375 mV
Lead Channel Sensing Intrinsic Amplitude: 3.375 mV
Lead Channel Setting Pacing Amplitude: 2 V
Lead Channel Setting Pacing Amplitude: 2 V
Lead Channel Setting Pacing Amplitude: 2.5 V
Lead Channel Setting Pacing Pulse Width: 0.4 ms
Lead Channel Setting Pacing Pulse Width: 0.8 ms
Lead Channel Setting Sensing Sensitivity: 0.3 mV
Zone Setting Status: 755011
Zone Setting Status: 755011

## 2022-07-06 NOTE — Progress Notes (Signed)
Remote ICD transmission.   

## 2022-08-18 ENCOUNTER — Encounter (INDEPENDENT_AMBULATORY_CARE_PROVIDER_SITE_OTHER): Payer: Self-pay

## 2022-09-08 ENCOUNTER — Ambulatory Visit: Payer: No Typology Code available for payment source | Admitting: *Deleted

## 2022-09-08 VITALS — BP 102/61 | HR 79 | Ht 65.0 in | Wt 223.4 lb

## 2022-09-08 DIAGNOSIS — Z0000 Encounter for general adult medical examination without abnormal findings: Secondary | ICD-10-CM | POA: Diagnosis not present

## 2022-09-08 NOTE — Patient Instructions (Signed)
Brenda Marsh , Thank you for taking time to come for your Medicare Wellness Visit. I appreciate your ongoing commitment to your health goals. Please review the following plan we discussed and let me know if I can assist you in the future.     This is a list of the screening recommended for you and due dates:  Health Maintenance  Topic Date Due   Complete foot exam   Never done   Zoster (Shingles) Vaccine (1 of 2) Never done   Pneumonia Vaccine (1 of 1 - PCV) Never done   DTaP/Tdap/Td vaccine (2 - Tdap) 09/05/2016   Yearly kidney health urinalysis for diabetes  05/10/2017   Colon Cancer Screening  01/14/2022   Hemoglobin A1C  03/18/2022   COVID-19 Vaccine (7 - 2023-24 season) 04/16/2022   Flu Shot  08/19/2022   Eye exam for diabetics  11/12/2022   Yearly kidney function blood test for diabetes  03/23/2023   Medicare Annual Wellness Visit  09/08/2023   DEXA scan (bone density measurement)  Completed   Hepatitis C Screening  Completed   HPV Vaccine  Aged Out    Next appointment: Follow up in one year for your annual wellness visit.   Preventive Care 6 Years and Older, Female Preventive care refers to lifestyle choices and visits with your health care provider that can promote health and wellness. What does preventive care include? A yearly physical exam. This is also called an annual well check. Dental exams once or twice a year. Routine eye exams. Ask your health care provider how often you should have your eyes checked. Personal lifestyle choices, including: Daily care of your teeth and gums. Regular physical activity. Eating a healthy diet. Avoiding tobacco and drug use. Limiting alcohol use. Practicing safe sex. Taking low-dose aspirin every day. Taking vitamin and mineral supplements as recommended by your health care provider. What happens during an annual well check? The services and screenings done by your health care provider during your annual well check will  depend on your age, overall health, lifestyle risk factors, and family history of disease. Counseling  Your health care provider may ask you questions about your: Alcohol use. Tobacco use. Drug use. Emotional well-being. Home and relationship well-being. Sexual activity. Eating habits. History of falls. Memory and ability to understand (cognition). Work and work Astronomer. Reproductive health. Screening  You may have the following tests or measurements: Height, weight, and BMI. Blood pressure. Lipid and cholesterol levels. These may be checked every 5 years, or more frequently if you are over 40 years old. Skin check. Lung cancer screening. You may have this screening every year starting at age 40 if you have a 30-pack-year history of smoking and currently smoke or have quit within the past 15 years. Fecal occult blood test (FOBT) of the stool. You may have this test every year starting at age 52. Flexible sigmoidoscopy or colonoscopy. You may have a sigmoidoscopy every 5 years or a colonoscopy every 10 years starting at age 55. Hepatitis C blood test. Hepatitis B blood test. Sexually transmitted disease (STD) testing. Diabetes screening. This is done by checking your blood sugar (glucose) after you have not eaten for a while (fasting). You may have this done every 1-3 years. Bone density scan. This is done to screen for osteoporosis. You may have this done starting at age 28. Mammogram. This may be done every 1-2 years. Talk to your health care provider about how often you should have regular mammograms. Talk with  your health care provider about your test results, treatment options, and if necessary, the need for more tests. Vaccines  Your health care provider may recommend certain vaccines, such as: Influenza vaccine. This is recommended every year. Tetanus, diphtheria, and acellular pertussis (Tdap, Td) vaccine. You may need a Td booster every 10 years. Zoster vaccine. You may  need this after age 73. Pneumococcal 13-valent conjugate (PCV13) vaccine. One dose is recommended after age 89. Pneumococcal polysaccharide (PPSV23) vaccine. One dose is recommended after age 77. Talk to your health care provider about which screenings and vaccines you need and how often you need them. This information is not intended to replace advice given to you by your health care provider. Make sure you discuss any questions you have with your health care provider. Document Released: 01/31/2015 Document Revised: 09/24/2015 Document Reviewed: 11/05/2014 Elsevier Interactive Patient Education  2017 [employer].  Fall Prevention in the Home Falls can cause injuries. They can happen to people of all ages. There are many things you can do to make your home safe and to help prevent falls. What can I do on the outside of my home? Regularly fix the edges of walkways and driveways and fix any cracks. Remove anything that might make you trip as you walk through a door, such as a raised step or threshold. Trim any bushes or trees on the path to your home. Use bright outdoor lighting. Clear any walking paths of anything that might make someone trip, such as rocks or tools. Regularly check to see if handrails are loose or broken. Make sure that both sides of any steps have handrails. Any raised decks and porches should have guardrails on the edges. Have any leaves, snow, or ice cleared regularly. Use sand or salt on walking paths during winter. Clean up any spills in your garage right away. This includes oil or grease spills. What can I do in the bathroom? Use night lights. Install grab bars by the toilet and in the tub and shower. Do not use towel bars as grab bars. Use non-skid mats or decals in the tub or shower. If you need to sit down in the shower, use a plastic, non-slip stool. Keep the floor dry. Clean up any water that spills on the floor as soon as it happens. Remove soap buildup in  the tub or shower regularly. Attach bath mats securely with double-sided non-slip rug tape. Do not have throw rugs and other things on the floor that can make you trip. What can I do in the bedroom? Use night lights. Make sure that you have a light by your bed that is easy to reach. Do not use any sheets or blankets that are too big for your bed. They should not hang down onto the floor. Have a firm chair that has side arms. You can use this for support while you get dressed. Do not have throw rugs and other things on the floor that can make you trip. What can I do in the kitchen? Clean up any spills right away. Avoid walking on wet floors. Keep items that you use a lot in easy-to-reach places. If you need to reach something above you, use a strong step stool that has a grab bar. Keep electrical cords out of the way. Do not use floor polish or wax that makes floors slippery. If you must use wax, use non-skid floor wax. Do not have throw rugs and other things on the floor that can make you trip.  What can I do with my stairs? Do not leave any items on the stairs. Make sure that there are handrails on both sides of the stairs and use them. Fix handrails that are broken or loose. Make sure that handrails are as long as the stairways. Check any carpeting to make sure that it is firmly attached to the stairs. Fix any carpet that is loose or worn. Avoid having throw rugs at the top or bottom of the stairs. If you do have throw rugs, attach them to the floor with carpet tape. Make sure that you have a light switch at the top of the stairs and the bottom of the stairs. If you do not have them, ask someone to add them for you. What else can I do to help prevent falls? Wear shoes that: Do not have high heels. Have rubber bottoms. Are comfortable and fit you well. Are closed at the toe. Do not wear sandals. If you use a stepladder: Make sure that it is fully opened. Do not climb a closed  stepladder. Make sure that both sides of the stepladder are locked into place. Ask someone to hold it for you, if possible. Clearly mark and make sure that you can see: Any grab bars or handrails. First and last steps. Where the edge of each step is. Use tools that help you move around (mobility aids) if they are needed. These include: Canes. Walkers. Scooters. Crutches. Turn on the lights when you go into a dark area. Replace any light bulbs as soon as they burn out. Set up your furniture so you have a clear path. Avoid moving your furniture around. If any of your floors are uneven, fix them. If there are any pets around you, be aware of where they are. Review your medicines with your doctor. Some medicines can make you feel dizzy. This can increase your chance of falling. Ask your doctor what other things that you can do to help prevent falls. This information is not intended to replace advice given to you by your health care provider. Make sure you discuss any questions you have with your health care provider. Document Released: 10/31/2008 Document Revised: 06/12/2015 Document Reviewed: 02/08/2014 Elsevier Interactive Patient Education  2017 [employer].

## 2022-09-08 NOTE — Progress Notes (Signed)
Subjective:   Brenda Marsh is a 77 y.o. female who presents for Medicare Annual (Subsequent) preventive examination.  Visit Complete: In person   Review of Systems     Cardiac Risk Factors include: advanced age (>59men, >36 women);dyslipidemia;obesity (BMI >30kg/m2);hypertension;diabetes mellitus     Objective:    Today's Vitals   09/08/22 1305  BP: 102/61  Pulse: 79  Weight: 223 lb 6.4 oz (101.3 kg)  Height: 5\' 5"  (1.651 m)   Body mass index is 37.18 kg/m.     09/08/2022    1:11 PM 10/29/2021   12:07 PM 09/02/2021    1:06 PM 10/29/2020   12:24 PM 08/25/2020    1:08 PM 06/19/2020   12:30 PM 10/31/2019    3:45 PM  Advanced Directives  Does Patient Have a Medical Advance Directive? Yes Yes Yes Yes Yes Yes Yes  Type of Advance Directive Living will;Healthcare Power of Attorney;Out of facility DNR (pink MOST or yellow form) Healthcare Power of [place];Living will Healthcare Power of [place];Living will Living will;Healthcare Power of [employer] Power of [place];Living will Living will Healthcare Power of [place];Living will  Does patient want to make changes to medical advance directive? No - Patient declined  No - Patient declined No - Patient declined   No - Patient declined  Copy of Healthcare Power of Attorney in Chart? No - copy requested No - copy requested No - copy requested No - copy requested No - copy requested      Current Medications (verified) Outpatient Encounter Medications as of 09/08/2022  Medication Sig   Ascorbic Acid (VITAMIN C PO) Take 1 tablet by mouth every evening.   bisoprolol (ZEBETA) 5 MG tablet TAKE 1/2 TABLET (2.5 MG TOTAL) BY MOUTH DAILY   Cholecalciferol (VITAMIN D3) 50 MCG (2000 UT) TABS Take 6,000 Units by mouth every evening.   fexofenadine (ALLEGRA) 180 MG tablet Take 180 mg by mouth daily.   meclizine (ANTIVERT) 12.5 MG tablet Take 1 tablet (12.5 mg total) by mouth 3 (three) times daily as needed for dizziness.   Multiple  Vitamin (MULTIVITAMIN WITH MINERALS) TABS tablet Take 1 tablet by mouth every evening.   spironolactone (ALDACTONE) 25 MG tablet Take 0.5 tablets (12.5 mg total) by mouth daily.   vitamin C (ASCORBIC ACID) 500 MG tablet Take 500 mg by mouth every evening.   Zinc 50 MG TABS Take 50 mg by mouth every other day. In the evening   [DISCONTINUED] scopolamine (TRANSDERM-SCOP) 1 MG/3DAYS Place 1 patch (1.5 mg total) onto the skin every 3 (three) days.   No facility-administered encounter medications on file as of 09/08/2022.    Allergies (verified) Atorvastatin, Naproxen sodium, Shellfish allergy, Contrast media [iodinated contrast media], Crestor [rosuvastatin], Metrizamide, Lisinopril, Penicillins, and Simvastatin   History: Past Medical History:  Diagnosis Date   AICD (automatic cardioverter/defibrillator) present    Allergic state 05/17/2016   Dr Dellis Anes asthma and allergy specialist.   Allergy    Arthritis    left knee pain, MRI in 2008-tricompartmental  degenerate changes, mucoid degeneration of ACL, post horn meniscal tear and ant horn meniscal tear   Asthma    Breast cancer in female [facility]) 2000   left; S/P lumpectomy, radiation and chemotherapy   CHF (congestive heart failure) (HCC)    Colon polyp    unclear pathology   Hyperlipidemia    Hypertension    LBBB (left bundle branch block)    Low back pain    left L3-4 injected   Lymphedema  of left arm    Nonischemic cardiomyopathy (HCC)    Presence of permanent cardiac pacemaker    Preventative health care 05/18/2016   Travel sickness 05/12/2015   Unspecified constipation 04/22/2012   Vertigo    Past Surgical History:  Procedure Laterality Date   APPENDECTOMY  2009   Dr. Ezzard Standing   BI-VENTRICULAR IMPLANTABLE CARDIOVERTER DEFIBRILLATOR N/A 11/16/2013   Procedure: BI-VENTRICULAR IMPLANTABLE CARDIOVERTER DEFIBRILLATOR  (CRT-D);  Surgeon: Marinus Maw, MD;  Location: [facility] CATH LAB;  Service: Cardiovascular;  Laterality: N/A;    BI-VENTRICULAR IMPLANTABLE CARDIOVERTER DEFIBRILLATOR  (CRT-D)  11/16/13   MDT CRTD implanted by Dr Ladona Ridgel for NICM, CHF, and LBBB   BIV ICD GENERATOR CHANGEOUT N/A 06/19/2020   Procedure: BIV ICD GENERATOR CHANGEOUT;  Surgeon: Marinus Maw, MD;  Location: [facility] INVASIVE CV LAB;  Service: Cardiovascular;  Laterality: N/A;   BREAST BIOPSY Left 2000   BREAST LUMPECTOMY Left 2000   CARDIAC CATHETERIZATION  ~ 2010   "no blockage"   CARPAL TUNNEL RELEASE Bilateral 2007   Dr. Teressa Senter   COLONOSCOPY     KNEE ARTHROSCOPY Right    POLYPECTOMY     TONSILLECTOMY     TRIGGER FINGER RELEASE Bilateral 2007   VAGINAL HYSTERECTOMY     Paritial   Family History  Problem Relation Age of Onset   Heart attack Mother 57   Diabetes Sister    Diabetes Brother    Colon cancer Maternal Aunt    Learning disabilities Paternal Uncle    Coronary artery disease Maternal Grandmother    Cancer Daughter        breast cancer   Parkinson's disease Daughter    Thyroid disease Daughter    Hypertension Daughter    Diabetes Daughter    Coronary artery disease Other        Female first degree relative <60   Hyperlipidemia Other    Hypertension Other    Cancer Other        prostate, 1st degree relative <50   Allergic rhinitis Neg Hx    Angioedema Neg Hx    Asthma Neg Hx    Eczema Neg Hx    Immunodeficiency Neg Hx    Urticaria Neg Hx    Stomach cancer Neg Hx    Rectal cancer Neg Hx    Esophageal cancer Neg Hx    Liver cancer Neg Hx    Colon polyps Neg Hx    Neuropathy Neg Hx    Social History   Socioeconomic History   Marital status: Widowed    Spouse name: Not on file   Number of children: 2   Years of education: 14   Highest education level: Not on file  Occupational History    Employer: DISABLED  Tobacco Use   Smoking status: Former    Current packs/day: 0.00    Average packs/day: 0.1 packs/day for 2.0 years (0.2 ttl pk-yrs)    Types: Cigarettes    Start date: 01/19/1968    Quit date: 01/18/1970     Years since quitting: 52.6   Smokeless tobacco: Never   Tobacco comments:    "smoked maybe 1 pack/month when I did smoke  Vaping Use   Vaping status: Never Used  Substance and Sexual Activity   Alcohol use: No   Drug use: No   Sexual activity: Never    Birth control/protection: Surgical, Post-menopausal  Other Topics Concern   Not on file  Social History Narrative   Widow - husband died in  2005   Occupation - retired from working in Museum/gallery curator   2 daughter - on lives in [place], other in [place]   Remote history of tobacco but none in 40 years   Caffeine- coffee, 1 cup daily; 1 energy drink daily (V8)   Social Determinants of Health   Financial Resource Strain: Medium Risk (09/08/2022)   Overall Financial Resource Strain (CARDIA)    Difficulty of Paying Living Expenses: Somewhat hard  Food Insecurity: No Food Insecurity (09/08/2022)   Hunger Vital Sign    Worried About Running Out of Food in the Last Year: Never true    Ran Out of Food in the Last Year: Never true  Transportation Needs: No Transportation Needs (09/08/2022)   PRAPARE - Administrator, Civil Service (Medical): No    Lack of Transportation (Non-Medical): No  Physical Activity: Inactive (09/08/2022)   Exercise Vital Sign    Days of Exercise per Week: 0 days    Minutes of Exercise per Session: 0 min  Stress: No Stress Concern Present (09/08/2022)   [employer] of Occupational Health - Occupational Stress Questionnaire    Feeling of Stress : Not at all  Social Connections: Moderately Integrated (09/08/2022)   Social Connection and Isolation Panel [NHANES]    Frequency of Communication with Friends and Family: More than three times a week    Frequency of Social Gatherings with Friends and Family: Once a week    Attends Religious Services: More than 4 times per year    Active Member of [employer] or Organizations: Yes    Attends Banker Meetings: More than 4 times per year    Marital  Status: Widowed    Tobacco Counseling Counseling given: Not Answered Tobacco comments: "smoked maybe 1 pack/month when I did smoke   Clinical Intake:  Pre-visit preparation completed: Yes  Pain : No/denies pain  BMI - recorded: 37.18 Nutritional Status: BMI > 30  Obese Nutritional Risks: None Diabetes: Yes CBG done?: No Did pt. bring in CBG monitor from home?: No  How often do you need to have someone help you when you read instructions, pamphlets, or other written materials from your doctor or pharmacy?: 1 - Never  Interpreter Needed?: No  Information entered by :: Donne Anon, CMA   Activities of Daily Living    09/08/2022    1:07 PM  In your present state of health, do you have any difficulty performing the following activities:  Hearing? 0  Vision? 1  Comment bilateral cataracts  Difficulty concentrating or making decisions? 0  Walking or climbing stairs? 0  Dressing or bathing? 0  Doing errands, shopping? 0  Preparing Food and eating ? N  Using the Toilet? N  In the past six months, have you accidently leaked urine? N  Do you have problems with loss of bowel control? N  Managing your Medications? N  Managing your Finances? N  Housekeeping or managing your Housekeeping? N    Patient Care Team: Bradd Canary, MD as PCP - General (Family Medicine) Marinus Maw, MD as Consulting Physician (Cardiology) Rachael Fee, MD as Consulting Physician (Gastroenterology) Dellis Anes Hetty Ely, MD as Consulting Physician (Allergy and Immunology) Josph Macho, MD as Consulting Physician (Oncology) Jens Som Madolyn Frieze, MD as Consulting Physician (Cardiology) Suzanna Obey, MD as Consulting Physician (Otolaryngology)  Indicate any recent Medical Services you may have received from other than [facility] providers in the past year (date may be approximate).  Assessment:   This is a routine wellness examination for Melaney.  Hearing/Vision screen No results  found.  Dietary issues and exercise activities discussed:     Goals Addressed             This Visit's Progress    Increase physical activity   Not on track      Depression Screen    09/08/2022    1:22 PM 03/23/2022    2:10 PM 09/02/2021    1:07 PM 08/25/2020    1:11 PM 05/22/2019   11:02 AM 05/17/2016    1:43 PM 05/12/2015    9:55 AM  PHQ 2/9 Scores  PHQ - 2 Score 0 0 0 0 0 0 0  PHQ- 9 Score     2      Fall Risk    09/08/2022    1:09 PM 03/23/2022    2:10 PM 09/02/2021    1:07 PM 08/25/2020    1:10 PM 08/13/2019    9:56 AM  Fall Risk   Falls in the past year? 0 0 0 0 0  Number falls in past yr: 0 0 0 0 0  Injury with Fall? 0 0 0 0 0  Risk for fall due to : No Fall Risks No Fall Risks No Fall Risks    Follow up Falls evaluation completed Falls evaluation completed Falls evaluation completed Falls prevention discussed Education provided;Falls prevention discussed    MEDICARE RISK AT HOME: Medicare Risk at Home Any stairs in or around the home?: Yes If so, are there any without handrails?: No Home free of loose throw rugs in walkways, pet beds, electrical cords, etc?: Yes Adequate lighting in your home to reduce risk of falls?: Yes Life alert?: Yes Use of a cane, walker or w/c?: No Grab bars in the bathroom?: No Shower chair or bench in shower?: No Elevated toilet seat or a handicapped toilet?: No  TIMED UP AND GO:  Was the test performed?  Yes  Length of time to ambulate 10 feet: 6 sec Gait steady and fast without use of assistive device    Cognitive Function:    05/17/2016    1:45 PM  MMSE - Mini Mental State Exam  Orientation to time 5  Orientation to Place 5  Registration 3  Attention/ Calculation 5  Recall 2  Language- name 2 objects 2  Language- repeat 1  Language- follow 3 step command 3  Language- read & follow direction 1  Write a sentence 1  Copy design 1  Total score 29        09/08/2022    1:24 PM 09/02/2021    1:16 PM  6CIT Screen  What  Year? 0 points 0 points  What month? 0 points 0 points  What time? 0 points 0 points  Count back from 20 0 points 0 points  Months in reverse 0 points 2 points  Repeat phrase 0 points 0 points  Total Score 0 points 2 points    Immunizations Immunization History  Administered Date(s) Administered   COVID-19, mRNA, vaccine(Comirnaty)12 years and older 12/16/2021   PFIZER Comirnaty(Gray Top)Covid-19 Tri-Sucrose Vaccine 05/22/2020   PFIZER(Purple Top)SARS-COV-2 Vaccination 02/24/2019, 03/17/2019, 09/15/2019   Pfizer Covid-19 Vaccine Bivalent Booster 64yrs & up 11/18/2020   Td 09/06/2006    TDAP status: Due, Education has been provided regarding the importance of this vaccine. Advised may receive this vaccine at local pharmacy or Health Dept. Aware to provide a copy of the vaccination record if  obtained from local pharmacy or Health Dept. Verbalized acceptance and understanding.  Flu Vaccine status: Due, Education has been provided regarding the importance of this vaccine. Advised may receive this vaccine at local pharmacy or Health Dept. Aware to provide a copy of the vaccination record if obtained from local pharmacy or Health Dept. Verbalized acceptance and understanding.  Pneumococcal vaccine status: Due, Education has been provided regarding the importance of this vaccine. Advised may receive this vaccine at local pharmacy or Health Dept. Aware to provide a copy of the vaccination record if obtained from local pharmacy or Health Dept. Verbalized acceptance and understanding.  Covid-19 vaccine status: Information provided on how to obtain vaccines.   Qualifies for Shingles Vaccine? Yes   Zostavax completed No   Shingrix Completed?: No.    Education has been provided regarding the importance of this vaccine. Patient has been advised to call insurance company to determine out of pocket expense if they have not yet received this vaccine. Advised may also receive vaccine at local pharmacy or  Health Dept. Verbalized acceptance and understanding.  Screening Tests Health Maintenance  Topic Date Due   FOOT EXAM  Never done   Zoster Vaccines- Shingrix (1 of 2) Never done   Pneumonia Vaccine 12+ Years old (1 of 1 - PCV) Never done   DTaP/Tdap/Td (2 - Tdap) 09/05/2016   Diabetic kidney evaluation - Urine ACR  05/10/2017   Colonoscopy  01/14/2022   HEMOGLOBIN A1C  03/18/2022   COVID-19 Vaccine (7 - 2023-24 season) 04/16/2022   INFLUENZA VACCINE  08/19/2022   OPHTHALMOLOGY EXAM  11/12/2022   Diabetic kidney evaluation - eGFR measurement  03/23/2023   Medicare Annual Wellness (AWV)  09/08/2023   DEXA SCAN  Completed   Hepatitis C Screening  Completed   HPV VACCINES  Aged Out    Health Maintenance  Health Maintenance Due  Topic Date Due   FOOT EXAM  Never done   Zoster Vaccines- Shingrix (1 of 2) Never done   Pneumonia Vaccine 58+ Years old (1 of 1 - PCV) Never done   DTaP/Tdap/Td (2 - Tdap) 09/05/2016   Diabetic kidney evaluation - Urine ACR  05/10/2017   Colonoscopy  01/14/2022   HEMOGLOBIN A1C  03/18/2022   COVID-19 Vaccine (7 - 2023-24 season) 04/16/2022   INFLUENZA VACCINE  08/19/2022    Colorectal cancer screening: Type of screening: Colonoscopy. Completed 01/14/17. Repeat every 5 years  Mammogram status: Ordered 09/15/21. Pt provided with contact info and advised to call to schedule appt.   Bone Density status: Completed 11/11/20. Results reflect: Bone density results: OSTEOPENIA. Repeat every 2 years.  Lung Cancer Screening: (Low Dose CT Chest recommended if Age 75-80 years, 20 pack-year currently smoking OR have quit w/in 15years.) does not qualify.   Additional Screening:  Hepatitis C Screening: does qualify; Completed 05/12/15  Vision Screening: Recommended annual ophthalmology exams for early detection of glaucoma and other disorders of the eye. Is the patient up to date with their annual eye exam?  Yes  Who is the provider or what is the name of the  office in which the patient attends annual eye exams? [facility] If pt is not established with a provider, would they like to be referred to a provider to establish care? No .   Dental Screening: Recommended annual dental exams for proper oral hygiene  Diabetic Foot Exam: Diabetic Foot Exam: Overdue, Pt has been advised about the importance in completing this exam. Pt is scheduled for diabetic foot exam  on N/a.  Community Resource Referral / Chronic Care Management: CRR required this visit?  No   CCM required this visit?  No     Plan:     I have personally reviewed and noted the following in the patient's chart:   Medical and social history Use of alcohol, tobacco or illicit drugs  Current medications and supplements including opioid prescriptions. Patient is not currently taking opioid prescriptions. Functional ability and status Nutritional status Physical activity Advanced directives List of other physicians Hospitalizations, surgeries, and ER visits in previous 12 months Vitals Screenings to include cognitive, depression, and falls Referrals and appointments  In addition, I have reviewed and discussed with patient certain preventive protocols, quality metrics, and best practice recommendations. A written personalized care plan for preventive services as well as general preventive health recommendations were provided to patient.     Donne Anon, CMA   09/08/2022   After Visit Summary: Sent to mychart.  Nurse Notes: None

## 2022-09-09 ENCOUNTER — Other Ambulatory Visit: Payer: Self-pay | Admitting: Cardiology

## 2022-09-09 DIAGNOSIS — I5022 Chronic systolic (congestive) heart failure: Secondary | ICD-10-CM

## 2022-09-16 ENCOUNTER — Ambulatory Visit (INDEPENDENT_AMBULATORY_CARE_PROVIDER_SITE_OTHER): Payer: No Typology Code available for payment source

## 2022-09-16 DIAGNOSIS — I428 Other cardiomyopathies: Secondary | ICD-10-CM

## 2022-09-16 LAB — CUP PACEART REMOTE DEVICE CHECK
Battery Remaining Longevity: 56 mo
Battery Voltage: 2.98 V
Brady Statistic AP VP Percent: 0.05 %
Brady Statistic AP VS Percent: 0.01 %
Brady Statistic AS VP Percent: 98.48 %
Brady Statistic AS VS Percent: 1.46 %
Brady Statistic RA Percent Paced: 0.06 %
Brady Statistic RV Percent Paced: 93.24 %
Date Time Interrogation Session: 20240829012506
HighPow Impedance: 77 Ohm
Implantable Lead Connection Status: 753985
Implantable Lead Connection Status: 753985
Implantable Lead Connection Status: 753985
Implantable Lead Implant Date: 20151030
Implantable Lead Implant Date: 20151030
Implantable Lead Implant Date: 20151030
Implantable Lead Location: 753858
Implantable Lead Location: 753859
Implantable Lead Location: 753860
Implantable Lead Model: 4598
Implantable Lead Model: 5076
Implantable Lead Model: 6935
Implantable Pulse Generator Implant Date: 20220602
Lead Channel Impedance Value: 1026 Ohm
Lead Channel Impedance Value: 1026 Ohm
Lead Channel Impedance Value: 1121 Ohm
Lead Channel Impedance Value: 1349 Ohm
Lead Channel Impedance Value: 1349 Ohm
Lead Channel Impedance Value: 249.375
Lead Channel Impedance Value: 254.534
Lead Channel Impedance Value: 263.855
Lead Channel Impedance Value: 342 Ohm
Lead Channel Impedance Value: 358.75 Ohm
Lead Channel Impedance Value: 369.526
Lead Channel Impedance Value: 399 Ohm
Lead Channel Impedance Value: 399 Ohm
Lead Channel Impedance Value: 475 Ohm
Lead Channel Impedance Value: 665 Ohm
Lead Channel Impedance Value: 703 Ohm
Lead Channel Impedance Value: 779 Ohm
Lead Channel Impedance Value: 950 Ohm
Lead Channel Pacing Threshold Amplitude: 0.75 V
Lead Channel Pacing Threshold Amplitude: 0.875 V
Lead Channel Pacing Threshold Amplitude: 1.875 V
Lead Channel Pacing Threshold Pulse Width: 0.4 ms
Lead Channel Pacing Threshold Pulse Width: 0.4 ms
Lead Channel Pacing Threshold Pulse Width: 0.8 ms
Lead Channel Sensing Intrinsic Amplitude: 16.125 mV
Lead Channel Sensing Intrinsic Amplitude: 16.125 mV
Lead Channel Sensing Intrinsic Amplitude: 3.875 mV
Lead Channel Sensing Intrinsic Amplitude: 3.875 mV
Lead Channel Setting Pacing Amplitude: 2 V
Lead Channel Setting Pacing Amplitude: 2 V
Lead Channel Setting Pacing Amplitude: 2.5 V
Lead Channel Setting Pacing Pulse Width: 0.4 ms
Lead Channel Setting Pacing Pulse Width: 0.8 ms
Lead Channel Setting Sensing Sensitivity: 0.3 mV
Zone Setting Status: 755011
Zone Setting Status: 755011

## 2022-09-23 NOTE — Progress Notes (Signed)
Remote ICD transmission.   

## 2022-09-29 NOTE — Progress Notes (Signed)
HPI: FU dilated cardiomyopathy. Patient has a history of left bundle branch block. She underwent cardiac catheterization in [facility] in March of 2010 for reduced LV function. Ejection fraction was 35%. The left main was normal. The LAD had a proximal 25-30% lesion. The circumflex was normal. The right coronary artery had a 15% lesion. Patient was treated medically. Echocardiogram in August 2015 showed an ejection fraction of 25-30% and restrictive filling. Had biventricular ICD October 2015. Carotid Dopplers August 2019 showed less than 50% bilaterally.  Echocardiogram April 2023 showed ejection fraction 45 to 50%, grade 1 diastolic dysfunction, mild left atrial enlargement, trace aortic insufficiency..  Since she was last seen, she denies dyspnea, chest pain, palpitations or syncope.  Current Outpatient Medications  Medication Sig Dispense Refill   Ascorbic Acid (VITAMIN C PO) Take 1 tablet by mouth every evening.     bisoprolol (ZEBETA) 5 MG tablet TAKE 1/2 TABLET (2.5 MG TOTAL) BY MOUTH DAILY 45 tablet PRN   Cholecalciferol (VITAMIN D3) 50 MCG (2000 UT) TABS Take 6,000 Units by mouth every evening.     fexofenadine (ALLEGRA) 180 MG tablet Take 180 mg by mouth daily.     meclizine (ANTIVERT) 12.5 MG tablet Take 1 tablet (12.5 mg total) by mouth 3 (three) times daily as needed for dizziness. 90 tablet 3   Multiple Vitamin (MULTIVITAMIN WITH MINERALS) TABS tablet Take 1 tablet by mouth every evening.     spironolactone (ALDACTONE) 25 MG tablet TAKE 1/2 TABLET BY MOUTH EVERY DAY 15 tablet 0   vitamin C (ASCORBIC ACID) 500 MG tablet Take 500 mg by mouth every evening.     Zinc 50 MG TABS Take 50 mg by mouth every other day. In the evening     No current facility-administered medications for this visit.     Past Medical History:  Diagnosis Date   AICD (automatic cardioverter/defibrillator) present    Allergic state 05/17/2016   Dr Dellis Anes asthma and allergy specialist.   Allergy     Arthritis    left knee pain, MRI in 2008-tricompartmental  degenerate changes, mucoid degeneration of ACL, post horn meniscal tear and ant horn meniscal tear   Asthma    Breast cancer in female [facility]) 2000   left; S/P lumpectomy, radiation and chemotherapy   CHF (congestive heart failure) (HCC)    Colon polyp    unclear pathology   Hyperlipidemia    Hypertension    LBBB (left bundle branch block)    Low back pain    left L3-4 injected   Lymphedema of left arm    Nonischemic cardiomyopathy (HCC)    Presence of permanent cardiac pacemaker    Preventative health care 05/18/2016   Travel sickness 05/12/2015   Unspecified constipation 04/22/2012   Vertigo     Past Surgical History:  Procedure Laterality Date   APPENDECTOMY  2009   Dr. Ezzard Standing   BI-VENTRICULAR IMPLANTABLE CARDIOVERTER DEFIBRILLATOR N/A 11/16/2013   Procedure: BI-VENTRICULAR IMPLANTABLE CARDIOVERTER DEFIBRILLATOR  (CRT-D);  Surgeon: Marinus Maw, MD;  Location: [facility] CATH LAB;  Service: Cardiovascular;  Laterality: N/A;   BI-VENTRICULAR IMPLANTABLE CARDIOVERTER DEFIBRILLATOR  (CRT-D)  11/16/13   MDT CRTD implanted by Dr Ladona Ridgel for NICM, CHF, and LBBB   BIV ICD GENERATOR CHANGEOUT N/A 06/19/2020   Procedure: BIV ICD GENERATOR CHANGEOUT;  Surgeon: Marinus Maw, MD;  Location: [facility] INVASIVE CV LAB;  Service: Cardiovascular;  Laterality: N/A;   BREAST BIOPSY Left 2000   BREAST LUMPECTOMY Left 2000  CARDIAC CATHETERIZATION  ~ 10-26-2008   "no blockage"   CARPAL TUNNEL RELEASE Bilateral 10-26-2005   Dr. Teressa Senter   COLONOSCOPY     KNEE ARTHROSCOPY Right    POLYPECTOMY     TONSILLECTOMY     TRIGGER FINGER RELEASE Bilateral 10-26-05   VAGINAL HYSTERECTOMY     Paritial    Social History   Socioeconomic History   Marital status: Widowed    Spouse name: Not on file   Number of children: 2   Years of education: 14   Highest education level: Not on file  Occupational History    Employer: DISABLED  Tobacco Use   Smoking status: Former     Current packs/day: 0.00    Average packs/day: 0.1 packs/day for 2.0 years (0.2 ttl pk-yrs)    Types: Cigarettes    Start date: 01/19/1968    Quit date: 01/18/1970    Years since quitting: 52.7   Smokeless tobacco: Never   Tobacco comments:    "smoked maybe 1 pack/month when I did smoke  Vaping Use   Vaping status: Never Used  Substance and Sexual Activity   Alcohol use: No   Drug use: No   Sexual activity: Never    Birth control/protection: Surgical, Post-menopausal  Other Topics Concern   Not on file  Social History Narrative   Widow - husband died in 2003/10/27   Occupation - retired from working in Museum/gallery curator   2 daughter - on lives in [place], other in [place]   Remote history of tobacco but none in 40 years   Caffeine- coffee, 1 cup daily; 1 energy drink daily (V8)   Social Determinants of Health   Financial Resource Strain: Medium Risk (09/08/2022)   Overall Financial Resource Strain (CARDIA)    Difficulty of Paying Living Expenses: Somewhat hard  Food Insecurity: No Food Insecurity (09/08/2022)   Hunger Vital Sign    Worried About Running Out of Food in the Last Year: Never true    Ran Out of Food in the Last Year: Never true  Transportation Needs: No Transportation Needs (09/08/2022)   PRAPARE - Administrator, Civil Service (Medical): No    Lack of Transportation (Non-Medical): No  Physical Activity: Inactive (09/08/2022)   Exercise Vital Sign    Days of Exercise per Week: 0 days    Minutes of Exercise per Session: 0 min  Stress: No Stress Concern Present (09/08/2022)   [employer] of Occupational Health - Occupational Stress Questionnaire    Feeling of Stress : Not at all  Social Connections: Moderately Integrated (09/08/2022)   Social Connection and Isolation Panel [NHANES]    Frequency of Communication with Friends and Family: More than three times a week    Frequency of Social Gatherings with Friends and Family: Once a week    Attends Religious  Services: More than 4 times per year    Active Member of [employer] or Organizations: Yes    Attends Banker Meetings: More than 4 times per year    Marital Status: Widowed  Intimate Partner Violence: Not At Risk (09/08/2022)   Humiliation, Afraid, Rape, and Kick questionnaire    Fear of Current or Ex-Partner: No    Emotionally Abused: No    Physically Abused: No    Sexually Abused: No    Family History  Problem Relation Age of Onset   Heart attack Mother 38   Diabetes Sister    Diabetes Brother    Colon cancer Maternal  Aunt    Learning disabilities Paternal Uncle    Coronary artery disease Maternal Grandmother    Cancer Daughter        breast cancer   Parkinson's disease Daughter    Thyroid disease Daughter    Hypertension Daughter    Diabetes Daughter    Coronary artery disease Other        Female first degree relative <60   Hyperlipidemia Other    Hypertension Other    Cancer Other        prostate, 1st degree relative <50   Allergic rhinitis Neg Hx    Angioedema Neg Hx    Asthma Neg Hx    Eczema Neg Hx    Immunodeficiency Neg Hx    Urticaria Neg Hx    Stomach cancer Neg Hx    Rectal cancer Neg Hx    Esophageal cancer Neg Hx    Liver cancer Neg Hx    Colon polyps Neg Hx    Neuropathy Neg Hx     ROS: no fevers or chills, productive cough, hemoptysis, dysphasia, odynophagia, melena, hematochezia, dysuria, hematuria, rash, seizure activity, orthopnea, PND, pedal edema, claudication. Remaining systems are negative.  Physical Exam: Well-developed well-nourished in no acute distress.  Skin is warm and dry.  HEENT is normal.  Neck is supple.  Chest is clear to auscultation with normal expansion.  Cardiovascular exam is regular rate and rhythm.  Abdominal exam nontender or distended. No masses palpated. Extremities show no edema. neuro grossly intact  ECG- personally reviewed  A/P  1 cardiomyopathy-LV function mildly reduced on most recent  echocardiogram.  Patient previously did not tolerate ACE inhibitor or ARB.  She declined hydralazine/nitrates in the past.  Continue beta-blocker.  2 hypertension-patient's blood pressure is controlled.  Continue present medications.  She does not wish to consider other medications in the future.  3 history of hyperlipidemia-patient is intolerant to statins and does not want to pursue PCSK9 inhibitors.  She is willing to try Zetia.  Will begin 10 mg daily.  Check lipids and liver in 8 weeks.  4 chronic combined systolic/diastolic congestive heart failure-she appears to be euvolemic.  Continue diuretic.  5 history of ICD-followed by Dr. Ladona Ridgel.  6 carotid artery disease-mild on most recent Dopplers.  [name], MD

## 2022-10-06 ENCOUNTER — Encounter: Payer: Self-pay | Admitting: Cardiology

## 2022-10-06 ENCOUNTER — Ambulatory Visit: Payer: No Typology Code available for payment source | Attending: Cardiology | Admitting: Cardiology

## 2022-10-06 VITALS — BP 132/77 | HR 71 | Ht 65.0 in | Wt 224.0 lb

## 2022-10-06 DIAGNOSIS — I5022 Chronic systolic (congestive) heart failure: Secondary | ICD-10-CM

## 2022-10-06 DIAGNOSIS — I2510 Atherosclerotic heart disease of native coronary artery without angina pectoris: Secondary | ICD-10-CM

## 2022-10-06 DIAGNOSIS — I10 Essential (primary) hypertension: Secondary | ICD-10-CM | POA: Diagnosis not present

## 2022-10-06 DIAGNOSIS — E782 Mixed hyperlipidemia: Secondary | ICD-10-CM

## 2022-10-06 DIAGNOSIS — I428 Other cardiomyopathies: Secondary | ICD-10-CM

## 2022-10-06 MED ORDER — EZETIMIBE 10 MG PO TABS: 10.0000 mg | ORAL_TABLET | Freq: Every day | ORAL | 3 refills | Status: DC

## 2022-10-06 NOTE — Patient Instructions (Signed)
Medication Instructions:   START EZETIMIBE 10 MG ONCE DAILY  *If you need a refill on your cardiac medications before your next appointment, please call your pharmacy*   Lab Work:  Your physician recommends that you return for lab work in: [facility]  [employer]  Located on the 3 rd floor in [address] Hours-Monday - Friday 8 am-11:30 AM and 1 pm -4 pm   If you have labs (blood work) drawn today and your tests are completely normal, you will receive your results only by: MyChart Message (if you have MyChart) OR A paper copy in the mail If you have any lab test that is abnormal or we need to change your treatment, we will call you to review the results.    Follow-Up: At [facility], you and your health needs are our priority.  As part of our continuing mission to provide you with exceptional heart care, we have created designated Provider Care Teams.  These Care Teams include your primary Cardiologist (physician) and Advanced Practice Providers (APPs -  Physician Assistants and Nurse Practitioners) who all work together to provide you with the care you need, when you need it.  We recommend signing up for the patient portal called "MyChart".  Sign up information is provided on this After Visit Summary.  MyChart is used to connect with patients for Virtual Visits (Telemedicine).  Patients are able to view lab/test results, encounter notes, upcoming appointments, etc.  Non-urgent messages can be sent to your provider as well.   To learn more about what you can do with MyChart, go to [email or web address].    Your next appointment:   6 month(s)  Provider:   [name], MD

## 2022-11-04 ENCOUNTER — Telehealth: Payer: Self-pay | Admitting: Cardiology

## 2022-11-04 NOTE — Telephone Encounter (Signed)
Called to discuss side effects; no answer, left call back number.

## 2022-11-04 NOTE — Telephone Encounter (Signed)
Patient returned RN's call. 

## 2022-11-04 NOTE — Telephone Encounter (Signed)
Pt c/o medication issue:  1. Name of Medication:   ezetimibe (ZETIA) 10 MG tablet    2. How are you currently taking this medication (dosage and times per day)?   Take 1 tablet (10 mg total) by mouth daily.    3. Are you having a reaction (difficulty breathing--STAT)? No  4. What is your medication issue? Pt states that since starting medication she has began to have hip pain. Pt would like to know if another medication is able to be prescribed. She would like a callback regarding this matter. Please advise

## 2022-11-04 NOTE — Telephone Encounter (Signed)
The hip pain started in both sides of hips 4-5 days after starting Zetia. It hurts so bad that she is taking Advil, using lidocaine patch on both hips, and heat at night.   It hurts worse when she moves. She has not stopped taking it (she may stop now); she has been on it since 10/06/22. When she was on statins it caused severe pain in legs. She knows she needs something for her cholesterol, but this just hurts so bad. Wants an alternative.   Informed her that I will send this information to her provider and our Pharmacist for recommendations.

## 2022-11-04 NOTE — Telephone Encounter (Addendum)
Left message for pt of dr Ludwig Clarks recommendations.

## 2022-11-04 NOTE — Addendum Note (Signed)
Addended by: Freddi Starr on: 11/04/2022 03:20 PM   Modules accepted: Orders

## 2022-11-12 ENCOUNTER — Other Ambulatory Visit: Payer: Self-pay

## 2022-11-12 DIAGNOSIS — Z853 Personal history of malignant neoplasm of breast: Secondary | ICD-10-CM

## 2022-11-15 ENCOUNTER — Ambulatory Visit: Payer: No Typology Code available for payment source | Admitting: Hematology & Oncology

## 2022-11-15 ENCOUNTER — Other Ambulatory Visit: Payer: No Typology Code available for payment source

## 2022-11-15 ENCOUNTER — Inpatient Hospital Stay (HOSPITAL_BASED_OUTPATIENT_CLINIC_OR_DEPARTMENT_OTHER): Payer: No Typology Code available for payment source | Admitting: Hematology & Oncology

## 2022-11-15 ENCOUNTER — Inpatient Hospital Stay: Payer: No Typology Code available for payment source | Attending: Hematology & Oncology

## 2022-11-15 VITALS — BP 118/61 | HR 78 | Temp 98.1°F | Resp 17 | Ht 65.0 in | Wt 222.0 lb

## 2022-11-15 DIAGNOSIS — Z08 Encounter for follow-up examination after completed treatment for malignant neoplasm: Secondary | ICD-10-CM | POA: Insufficient documentation

## 2022-11-15 DIAGNOSIS — I890 Lymphedema, not elsewhere classified: Secondary | ICD-10-CM | POA: Insufficient documentation

## 2022-11-15 DIAGNOSIS — Z853 Personal history of malignant neoplasm of breast: Secondary | ICD-10-CM | POA: Insufficient documentation

## 2022-11-15 DIAGNOSIS — E119 Type 2 diabetes mellitus without complications: Secondary | ICD-10-CM

## 2022-11-15 DIAGNOSIS — M2550 Pain in unspecified joint: Secondary | ICD-10-CM | POA: Diagnosis not present

## 2022-11-15 DIAGNOSIS — Z1231 Encounter for screening mammogram for malignant neoplasm of breast: Secondary | ICD-10-CM | POA: Diagnosis not present

## 2022-11-15 LAB — CMP (CANCER CENTER ONLY)
ALT: 16 U/L (ref 0–44)
AST: 20 U/L (ref 15–41)
Albumin: 4.4 g/dL (ref 3.5–5.0)
Alkaline Phosphatase: 64 U/L (ref 38–126)
Anion gap: 8 (ref 5–15)
BUN: 12 mg/dL (ref 8–23)
CO2: 27 mmol/L (ref 22–32)
Calcium: 10.2 mg/dL (ref 8.9–10.3)
Chloride: 107 mmol/L (ref 98–111)
Creatinine: 0.94 mg/dL (ref 0.44–1.00)
GFR, Estimated: >60 mL/min (ref >60)
Glucose, Bld: 75 mg/dL (ref 70–99)
Potassium: 4.5 mmol/L (ref 3.5–5.1)
Sodium: 142 mmol/L (ref 135–145)
Total Bilirubin: 0.3 mg/dL (ref 0.3–1.2)
Total Protein: 7.2 g/dL (ref 6.5–8.1)

## 2022-11-15 LAB — CBC WITH DIFFERENTIAL (CANCER CENTER ONLY)
Abs Immature Granulocytes: 0.01 10*3/uL (ref 0.00–0.07)
Basophils Absolute: 0.1 10*3/uL (ref 0.0–0.1)
Basophils Relative: 1 %
Eosinophils Absolute: 0.3 10*3/uL (ref 0.0–0.5)
Eosinophils Relative: 6 %
HCT: 39.5 % (ref 36.0–46.0)
Hemoglobin: 13 g/dL (ref 12.0–15.0)
Immature Granulocytes: 0 %
Lymphocytes Relative: 41 %
Lymphs Abs: 2.1 10*3/uL (ref 0.7–4.0)
MCH: 31.1 pg (ref 26.0–34.0)
MCHC: 32.9 g/dL (ref 30.0–36.0)
MCV: 94.5 fL (ref 80.0–100.0)
Monocytes Absolute: 0.4 10*3/uL (ref 0.1–1.0)
Monocytes Relative: 8 %
Neutro Abs: 2.4 10*3/uL (ref 1.7–7.7)
Neutrophils Relative %: 44 %
Platelet Count: 210 10*3/uL (ref 150–400)
RBC: 4.18 MIL/uL (ref 3.87–5.11)
RDW: 14.2 % (ref 11.5–15.5)
WBC Count: 5.2 10*3/uL (ref 4.0–10.5)
nRBC: 0 % (ref 0.0–0.2)

## 2022-11-15 LAB — HM MAMMOGRAPHY

## 2022-11-15 LAB — HEMOGLOBIN A1C
Hgb A1c MFr Bld: 5.7 % — ABNORMAL HIGH (ref 4.8–5.6)
Mean Plasma Glucose: 116.89 mg/dL

## 2022-11-15 NOTE — Progress Notes (Signed)
Hematology and Oncology Follow Up Visit  KELENE MOURADIAN [id number] 1945-11-28 77 y.o. 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 Borges is here today for follow-up. She comes back for her yearly visit.  She is doing pretty well.  She had wonderful trip to [place].  She was up there for 8 days.  She had a very nice time.  She was put on I think Zetia to help with her cholesterol.  However, she cannot take this because of arthralgias.  She has had no problems with cough or shortness of breath.  Thankfully, there is been no problems with COVID.  She has had no change in bowel or bladder habits.  She has had no rashes.  She has had no leg swelling.  She has some chronic lymphedema of the left arm..  She has had no headache.  There is been no bleeding.  Overall, I think her heart has been doing pretty well.  She does have an implantable defibrillator.  She is followed pretty closely by cardiology.  Overall, I would say that her performance status is probably ECOG 1.    Medications:  Allergies as of 11/15/2022       Reactions   Atorvastatin Other (See Comments)   Myalgia, weakness   Naproxen Sodium Shortness Of Breath   Difficulty breathing   Shellfish Allergy Shortness Of Breath   Cant breath   Contrast Media [iodinated Contrast Media] Nausea And Vomiting   Crestor [rosuvastatin] Other (See Comments)   Myalgia, weakness   Metrizamide Nausea And Vomiting   Lisinopril Swelling, Cough   Difficulty breathing   Penicillins Rash   Rash on arms   Simvastatin Other (See Comments)   Myalgia, weakness        Medication List        Accurate as of November 15, 2022 10:22 AM. If you have any questions, ask your nurse or doctor.          ascorbic acid 500 MG tablet Commonly known as: VITAMIN C Take 500 mg by mouth every evening.   bisoprolol 5 MG  tablet Commonly known as: ZEBETA TAKE 1/2 TABLET (2.5 MG TOTAL) BY MOUTH DAILY   fexofenadine 180 MG tablet Commonly known as: ALLEGRA Take 180 mg by mouth daily.   meclizine 12.5 MG tablet Commonly known as: ANTIVERT Take 1 tablet (12.5 mg total) by mouth 3 (three) times daily as needed for dizziness.   multivitamin with minerals Tabs tablet Take 1 tablet by mouth every evening.   spironolactone 25 MG tablet Commonly known as: ALDACTONE TAKE 1/2 TABLET BY MOUTH EVERY DAY   VITAMIN C PO Take 1 tablet by mouth every evening.   Vitamin D3 50 MCG (2000 UT) Tabs Take 6,000 Units by mouth every evening.   Zinc 50 MG Tabs Take 50 mg by mouth every other day. In the evening        Allergies:   Past Medical History, Surgical history, Social history, and Family History were reviewed and updated.  Review of Systems: Review of Systems  Constitutional: Negative.   HENT: Negative.    Eyes: Negative.   Respiratory: Negative.    Cardiovascular: Negative.   Gastrointestinal: Negative.   Genitourinary: Negative.   Musculoskeletal: Negative.   Skin: Negative.   Neurological: Negative.   Endo/Heme/Allergies: Negative.   Psychiatric/Behavioral: Negative.  Physical Exam:  height is 5\' 5"  (1.651 m) and weight is 222 lb (100.7 kg). Her oral temperature is 98.1 F (36.7 C). Her blood pressure is 118/61 and her pulse is 78. Her respiration is 17 and oxygen saturation is 100%.   Wt Readings from Last 3 Encounters:  11/15/22 222 lb (100.7 kg)  10/06/22 224 lb (101.6 kg)  09/08/22 223 lb 6.4 oz (101.3 kg)    Physical Exam Vitals reviewed.  Constitutional:      Comments: Breast exam shows her right breast with no masses, edema or erythema.  There is no right axillary adenopathy.  Left breast is slightly contracted from surgery and radiation.  She has a well-healed lumpectomy scar at the 2 o'clock position.  There is some firmness at the lumpectomy site.  She has no distinct  mass in the left breast.  There is no left axillary adenopathy.  HENT:     Head: Normocephalic and atraumatic.  Eyes:     Pupils: Pupils are equal, round, and reactive to light.  Cardiovascular:     Rate and Rhythm: Normal rate and regular rhythm.     Heart sounds: Normal heart sounds.  Pulmonary:     Effort: Pulmonary effort is normal.     Breath sounds: Normal breath sounds.  Abdominal:     General: Bowel sounds are normal.     Palpations: Abdomen is soft.  Musculoskeletal:        General: No tenderness or deformity. Normal range of motion.     Cervical back: Normal range of motion.     Comments: There is lymphedema in the left arm.  This is moderate.  There is no erythema or warmth.  She has good range of motion of the left arm.  Lymphadenopathy:     Cervical: No cervical adenopathy.  Skin:    General: Skin is warm and dry.     Findings: No erythema or rash.  Neurological:     Mental Status: She is alert and oriented to person, place, and time.  Psychiatric:        Behavior: Behavior normal.        Thought Content: Thought content normal.        Judgment: Judgment normal.     Lab Results  Component Value Date   WBC 5.2 11/15/2022   HGB 13.0 11/15/2022   HCT 39.5 11/15/2022   MCV 94.5 11/15/2022   PLT 210 11/15/2022   Lab Results  Component Value Date   IRON 56 12/21/2012   TIBC 429 12/21/2012   UIBC 373 12/21/2012   IRONPCTSAT 13 (L) 12/21/2012   Lab Results  Component Value Date   RBC 4.18 11/15/2022   No results found for: "KPAFRELGTCHN", "LAMBDASER", "KAPLAMBRATIO" No results found for: "IGGSERUM", "IGA", "IGMSERUM" No results found for: "TOTALPROTELP", "ALBUMINELP", "A1GS", "A2GS", "BETS", "BETA2SER", "GAMS", "MSPIKE", "SPEI"   Chemistry      Component Value Date/Time   NA 142 11/15/2022 0934   NA 140 06/10/2020 0955   NA 143 09/29/2016 1500   K 4.5 11/15/2022 0934   K 3.8 09/29/2016 1500   CL 107 11/15/2022 0934   CL 105 09/29/2016 1500   CO2 27  11/15/2022 0934   CO2 28 09/29/2016 1500   BUN 12 11/15/2022 0934   BUN 10 06/10/2020 0955   BUN 12 09/29/2016 1500   CREATININE 0.94 11/15/2022 0934   CREATININE 0.9 09/29/2016 1500      Component Value Date/Time   CALCIUM 10.2 11/15/2022  1610   CALCIUM 9.7 09/29/2016 1500   ALKPHOS 64 11/15/2022 0934   ALKPHOS 81 09/29/2016 1500   AST 20 11/15/2022 0934   ALT 16 11/15/2022 0934   ALT 23 09/29/2016 1500   BILITOT 0.3 11/15/2022 0934     Impression and Plan: Ms. Blueford is a 77 year old African-American female with stage IIA ductal carcinoma the left breast. She had 1 lymph node positive. Her tumor was ER positive. She was on tamoxifen.  She is approximate 22 years out from treatment.  I really think that in the long run, her problems were probably be from her heart.  As always, she like to come back to see [place].  She just has a lot of confidence in her office.  We will plan to get her back in 1 year.    Josph Macho, MD 10/28/202410:22 AM

## 2022-11-17 ENCOUNTER — Encounter: Payer: Self-pay | Admitting: Family Medicine

## 2022-11-24 DIAGNOSIS — Z6841 Body Mass Index (BMI) 40.0 and over, adult: Secondary | ICD-10-CM | POA: Diagnosis not present

## 2022-11-24 DIAGNOSIS — E785 Hyperlipidemia, unspecified: Secondary | ICD-10-CM | POA: Diagnosis not present

## 2022-11-24 DIAGNOSIS — I130 Hypertensive heart and chronic kidney disease with heart failure and stage 1 through stage 4 chronic kidney disease, or unspecified chronic kidney disease: Secondary | ICD-10-CM | POA: Diagnosis not present

## 2022-11-24 DIAGNOSIS — N182 Chronic kidney disease, stage 2 (mild): Secondary | ICD-10-CM | POA: Diagnosis not present

## 2022-11-24 DIAGNOSIS — Z008 Encounter for other general examination: Secondary | ICD-10-CM | POA: Diagnosis not present

## 2022-11-24 DIAGNOSIS — I509 Heart failure, unspecified: Secondary | ICD-10-CM | POA: Diagnosis not present

## 2022-11-24 DIAGNOSIS — E261 Secondary hyperaldosteronism: Secondary | ICD-10-CM | POA: Diagnosis not present

## 2022-11-24 DIAGNOSIS — I700 Atherosclerosis of aorta: Secondary | ICD-10-CM | POA: Diagnosis not present

## 2022-11-30 DIAGNOSIS — H43813 Vitreous degeneration, bilateral: Secondary | ICD-10-CM | POA: Diagnosis not present

## 2022-11-30 DIAGNOSIS — E119 Type 2 diabetes mellitus without complications: Secondary | ICD-10-CM | POA: Diagnosis not present

## 2022-11-30 DIAGNOSIS — H18413 Arcus senilis, bilateral: Secondary | ICD-10-CM | POA: Diagnosis not present

## 2022-11-30 DIAGNOSIS — H524 Presbyopia: Secondary | ICD-10-CM | POA: Diagnosis not present

## 2022-11-30 DIAGNOSIS — H2513 Age-related nuclear cataract, bilateral: Secondary | ICD-10-CM | POA: Diagnosis not present

## 2022-11-30 DIAGNOSIS — H25013 Cortical age-related cataract, bilateral: Secondary | ICD-10-CM | POA: Diagnosis not present

## 2022-11-30 DIAGNOSIS — H5203 Hypermetropia, bilateral: Secondary | ICD-10-CM | POA: Diagnosis not present

## 2022-11-30 DIAGNOSIS — H43393 Other vitreous opacities, bilateral: Secondary | ICD-10-CM | POA: Diagnosis not present

## 2022-11-30 LAB — HM DIABETES EYE EXAM

## 2022-12-23 ENCOUNTER — Other Ambulatory Visit: Payer: Self-pay | Admitting: Cardiology

## 2022-12-26 ENCOUNTER — Other Ambulatory Visit: Payer: Self-pay | Admitting: Cardiology

## 2022-12-26 DIAGNOSIS — I5022 Chronic systolic (congestive) heart failure: Secondary | ICD-10-CM

## 2022-12-29 DIAGNOSIS — H2513 Age-related nuclear cataract, bilateral: Secondary | ICD-10-CM | POA: Diagnosis not present

## 2022-12-29 DIAGNOSIS — H25812 Combined forms of age-related cataract, left eye: Secondary | ICD-10-CM | POA: Diagnosis not present

## 2022-12-29 DIAGNOSIS — H25013 Cortical age-related cataract, bilateral: Secondary | ICD-10-CM | POA: Diagnosis not present

## 2022-12-29 DIAGNOSIS — H11133 Conjunctival pigmentations, bilateral: Secondary | ICD-10-CM | POA: Diagnosis not present

## 2023-01-14 ENCOUNTER — Telehealth: Payer: Self-pay

## 2023-01-14 NOTE — Telephone Encounter (Signed)
Call pt for remote transmission on Monday for clearance.

## 2023-01-17 ENCOUNTER — Telehealth: Payer: Self-pay

## 2023-01-17 ENCOUNTER — Encounter: Payer: Self-pay | Admitting: Internal Medicine

## 2023-01-17 NOTE — Telephone Encounter (Signed)
   Patient Name: Brenda Marsh  DOB: 06-10-1945 MRN: [id number]  Primary Cardiologist: None  Chart reviewed as part of pre-operative protocol coverage. Cataract extractions are recognized in guidelines as low risk surgeries that do not typically require specific preoperative testing or holding of blood thinner therapy. Therefore, given past medical history and time since last visit, based on ACC/AHA guidelines, Brenda Marsh would be at acceptable risk for the planned procedure without further cardiovascular testing.   I will route this recommendation to the requesting party via Epic fax function and remove from pre-op pool.  Please call with questions.  Sharlene Dory, PA-C 01/17/2023, 4:27 PM

## 2023-01-17 NOTE — Progress Notes (Signed)
PERIOPERATIVE PRESCRIPTION FOR IMPLANTED CARDIAC DEVICE PROGRAMMING   Patient Information:  Patient: Brenda Marsh  MRN: [id number]  Date of Birth: [birth year]     Surgeon:  Not indicated Surgeon's Group or Practice Name:  [facility]  Phone number:  [phone] Fax number:  [phone] Planned Procedure:  Left Cataract Extraction   Date of Procedure:   01/25/23     Device Information:   Clinic EP Physician:   Dr. Lewayne Bunting Device Type:  Defibrillator Manufacturer and Phone #:  [employer]tronic: [id number] Pacemaker Dependent?:  No Date of Last Device Check:  09/16/2022        Normal Device Function?:  Yes     Electrophysiologist's Recommendations:   Have magnet available. Provide continuous ECG monitoring when magnet is used or reprogramming is to be performed.  Procedure may interfere with device function.  Magnet should be placed over device during procedure.  Per Device Clinic Standing Orders, Lenor Coffin  01/17/2023 4:25 PM

## 2023-01-17 NOTE — Telephone Encounter (Signed)
   Pre-operative Risk Assessment    Patient Name: Brenda Marsh  DOB: 03/08/1945 MRN: [id number]   Date of last office visit: 10/06/2023 [name], MD Date of next office visit: 04/06/2023 [name], MD   Request for Surgical Clearance    Procedure:   Left Cataract Extraction  Date of Surgery:  Clearance 01/25/23                                 Surgeon:  Not indicated Surgeon's Group or Practice Name:  [facility]  Phone number:  [phone] Fax number:  [phone]   Type of Clearance Requested:   - Medical    Type of Anesthesia:   Regional   Additional requests/questions:  Please include Pacemaker report (within the last 3 months.  Elyse Jarvis   01/17/2023, 3:35 PM

## 2023-01-20 ENCOUNTER — Telehealth: Payer: Self-pay

## 2023-01-20 NOTE — Telephone Encounter (Signed)
 Pt opto surgeon called in wanting to know do we need to have a more updated transmission in order for the pt to have this surgery since the last one was in August? The clearance form has been sent already. We have tried to send transmission but got an error code of 3230, pt told to try again in an hour and we will check and see if we receive it in the morning. If not received we will call pt with tech on the line

## 2023-01-25 DIAGNOSIS — H25812 Combined forms of age-related cataract, left eye: Secondary | ICD-10-CM | POA: Diagnosis not present

## 2023-01-25 DIAGNOSIS — H2512 Age-related nuclear cataract, left eye: Secondary | ICD-10-CM | POA: Diagnosis not present

## 2023-03-17 ENCOUNTER — Ambulatory Visit (INDEPENDENT_AMBULATORY_CARE_PROVIDER_SITE_OTHER): Payer: Self-pay

## 2023-03-17 DIAGNOSIS — I428 Other cardiomyopathies: Secondary | ICD-10-CM

## 2023-03-18 LAB — CUP PACEART REMOTE DEVICE CHECK
Battery Remaining Longevity: 45 mo
Battery Voltage: 2.96 V
Brady Statistic AP VP Percent: 0.07 %
Brady Statistic AP VS Percent: 0.01 %
Brady Statistic AS VP Percent: 98.48 %
Brady Statistic AS VS Percent: 1.44 %
Brady Statistic RA Percent Paced: 0.08 %
Brady Statistic RV Percent Paced: 91.6 %
Date Time Interrogation Session: 20250227044225
HighPow Impedance: 72 Ohm
Implantable Lead Connection Status: 753985
Implantable Lead Connection Status: 753985
Implantable Lead Connection Status: 753985
Implantable Lead Implant Date: 20151030
Implantable Lead Implant Date: 20151030
Implantable Lead Implant Date: 20151030
Implantable Lead Location: 753858
Implantable Lead Location: 753859
Implantable Lead Location: 753860
Implantable Lead Model: 4598
Implantable Lead Model: 5076
Implantable Lead Model: 6935
Implantable Pulse Generator Implant Date: 20220602
Lead Channel Impedance Value: 1083 Ohm
Lead Channel Impedance Value: 1235 Ohm
Lead Channel Impedance Value: 1254 Ohm
Lead Channel Impedance Value: 253.786
Lead Channel Impedance Value: 256.667
Lead Channel Impedance Value: 269.677
Lead Channel Impedance Value: 304 Ohm
Lead Channel Impedance Value: 349.189
Lead Channel Impedance Value: 354.667
Lead Channel Impedance Value: 361 Ohm
Lead Channel Impedance Value: 418 Ohm
Lead Channel Impedance Value: 456 Ohm
Lead Channel Impedance Value: 646 Ohm
Lead Channel Impedance Value: 665 Ohm
Lead Channel Impedance Value: 760 Ohm
Lead Channel Impedance Value: 893 Ohm
Lead Channel Impedance Value: 988 Ohm
Lead Channel Impedance Value: 988 Ohm
Lead Channel Pacing Threshold Amplitude: 0.625 V
Lead Channel Pacing Threshold Amplitude: 0.875 V
Lead Channel Pacing Threshold Amplitude: 2 V
Lead Channel Pacing Threshold Pulse Width: 0.4 ms
Lead Channel Pacing Threshold Pulse Width: 0.4 ms
Lead Channel Pacing Threshold Pulse Width: 0.8 ms
Lead Channel Sensing Intrinsic Amplitude: 15.25 mV
Lead Channel Sensing Intrinsic Amplitude: 15.25 mV
Lead Channel Sensing Intrinsic Amplitude: 3.25 mV
Lead Channel Sensing Intrinsic Amplitude: 3.25 mV
Lead Channel Setting Pacing Amplitude: 2 V
Lead Channel Setting Pacing Amplitude: 2 V
Lead Channel Setting Pacing Amplitude: 2.5 V
Lead Channel Setting Pacing Pulse Width: 0.4 ms
Lead Channel Setting Pacing Pulse Width: 0.8 ms
Lead Channel Setting Sensing Sensitivity: 0.3 mV
Zone Setting Status: 755011
Zone Setting Status: 755011

## 2023-03-20 ENCOUNTER — Encounter: Payer: Self-pay | Admitting: Internal Medicine

## 2023-03-22 ENCOUNTER — Ambulatory Visit: Payer: Self-pay

## 2023-03-22 DIAGNOSIS — I5022 Chronic systolic (congestive) heart failure: Secondary | ICD-10-CM

## 2023-03-22 DIAGNOSIS — I428 Other cardiomyopathies: Secondary | ICD-10-CM

## 2023-03-24 ENCOUNTER — Encounter: Payer: Self-pay | Admitting: Internal Medicine

## 2023-03-24 ENCOUNTER — Ambulatory Visit: Payer: No Typology Code available for payment source | Attending: Internal Medicine | Admitting: Internal Medicine

## 2023-03-24 VITALS — BP 134/72 | HR 89 | Ht 64.0 in | Wt 222.0 lb

## 2023-03-24 DIAGNOSIS — I5042 Chronic combined systolic (congestive) and diastolic (congestive) heart failure: Secondary | ICD-10-CM | POA: Diagnosis not present

## 2023-03-24 DIAGNOSIS — Z95810 Presence of automatic (implantable) cardiac defibrillator: Secondary | ICD-10-CM | POA: Diagnosis not present

## 2023-03-24 LAB — CUP PACEART INCLINIC DEVICE CHECK
Battery Remaining Longevity: 46 mo
Battery Voltage: 2.96 V
Brady Statistic AP VP Percent: 0.09 %
Brady Statistic AP VS Percent: 0.01 %
Brady Statistic AS VP Percent: 98.08 %
Brady Statistic AS VS Percent: 1.82 %
Brady Statistic RA Percent Paced: 0.1 %
Brady Statistic RV Percent Paced: 91.66 %
Date Time Interrogation Session: 20250306105523
HighPow Impedance: 73 Ohm
Implantable Lead Connection Status: 753985
Implantable Lead Connection Status: 753985
Implantable Lead Connection Status: 753985
Implantable Lead Implant Date: 20151030
Implantable Lead Implant Date: 20151030
Implantable Lead Implant Date: 20151030
Implantable Lead Location: 753858
Implantable Lead Location: 753859
Implantable Lead Location: 753860
Implantable Lead Model: 4598
Implantable Lead Model: 5076
Implantable Lead Model: 6935
Implantable Pulse Generator Implant Date: 20220602
Lead Channel Impedance Value: 1007 Ohm
Lead Channel Impedance Value: 1007 Ohm
Lead Channel Impedance Value: 1121 Ohm
Lead Channel Impedance Value: 1292 Ohm
Lead Channel Impedance Value: 1311 Ohm
Lead Channel Impedance Value: 270.508
Lead Channel Impedance Value: 270.508
Lead Channel Impedance Value: 287.631
Lead Channel Impedance Value: 342 Ohm
Lead Channel Impedance Value: 358.75 Ohm
Lead Channel Impedance Value: 358.75 Ohm
Lead Channel Impedance Value: 456 Ohm
Lead Channel Impedance Value: 456 Ohm
Lead Channel Impedance Value: 475 Ohm
Lead Channel Impedance Value: 665 Ohm
Lead Channel Impedance Value: 665 Ohm
Lead Channel Impedance Value: 779 Ohm
Lead Channel Impedance Value: 931 Ohm
Lead Channel Pacing Threshold Amplitude: 0.75 V
Lead Channel Pacing Threshold Amplitude: 0.875 V
Lead Channel Pacing Threshold Amplitude: 1.75 V
Lead Channel Pacing Threshold Amplitude: 2 V
Lead Channel Pacing Threshold Pulse Width: 0.4 ms
Lead Channel Pacing Threshold Pulse Width: 0.4 ms
Lead Channel Pacing Threshold Pulse Width: 0.8 ms
Lead Channel Pacing Threshold Pulse Width: 0.8 ms
Lead Channel Sensing Intrinsic Amplitude: 18.375 mV
Lead Channel Sensing Intrinsic Amplitude: 21.875 mV
Lead Channel Sensing Intrinsic Amplitude: 4 mV
Lead Channel Sensing Intrinsic Amplitude: 4.875 mV
Lead Channel Setting Pacing Amplitude: 2 V
Lead Channel Setting Pacing Amplitude: 2 V
Lead Channel Setting Pacing Amplitude: 2.5 V
Lead Channel Setting Pacing Pulse Width: 0.4 ms
Lead Channel Setting Pacing Pulse Width: 0.8 ms
Lead Channel Setting Sensing Sensitivity: 0.3 mV
Zone Setting Status: 755011
Zone Setting Status: 755011

## 2023-03-24 LAB — CUP PACEART REMOTE DEVICE CHECK
Battery Remaining Longevity: 45 mo
Battery Voltage: 2.96 V
Brady Statistic AP VP Percent: 0.04 %
Brady Statistic AP VS Percent: 0.01 %
Brady Statistic AS VP Percent: 98.95 %
Brady Statistic AS VS Percent: 1 %
Brady Statistic RA Percent Paced: 0.05 %
Brady Statistic RV Percent Paced: 96.95 %
Date Time Interrogation Session: 20250304063425
HighPow Impedance: 69 Ohm
Implantable Lead Connection Status: 753985
Implantable Lead Connection Status: 753985
Implantable Lead Connection Status: 753985
Implantable Lead Implant Date: 20151030
Implantable Lead Implant Date: 20151030
Implantable Lead Implant Date: 20151030
Implantable Lead Location: 753858
Implantable Lead Location: 753859
Implantable Lead Location: 753860
Implantable Lead Model: 4598
Implantable Lead Model: 5076
Implantable Lead Model: 6935
Implantable Pulse Generator Implant Date: 20220602
Lead Channel Impedance Value: 1026 Ohm
Lead Channel Impedance Value: 1197 Ohm
Lead Channel Impedance Value: 1235 Ohm
Lead Channel Impedance Value: 226.51 Ohm
Lead Channel Impedance Value: 226.51 Ohm
Lead Channel Impedance Value: 240.667
Lead Channel Impedance Value: 304 Ohm
Lead Channel Impedance Value: 330.057
Lead Channel Impedance Value: 330.057
Lead Channel Impedance Value: 361 Ohm
Lead Channel Impedance Value: 361 Ohm
Lead Channel Impedance Value: 418 Ohm
Lead Channel Impedance Value: 608 Ohm
Lead Channel Impedance Value: 608 Ohm
Lead Channel Impedance Value: 722 Ohm
Lead Channel Impedance Value: 836 Ohm
Lead Channel Impedance Value: 931 Ohm
Lead Channel Impedance Value: 931 Ohm
Lead Channel Pacing Threshold Amplitude: 0.875 V
Lead Channel Pacing Threshold Amplitude: 0.875 V
Lead Channel Pacing Threshold Amplitude: 2 V
Lead Channel Pacing Threshold Pulse Width: 0.4 ms
Lead Channel Pacing Threshold Pulse Width: 0.4 ms
Lead Channel Pacing Threshold Pulse Width: 0.8 ms
Lead Channel Sensing Intrinsic Amplitude: 18.375 mV
Lead Channel Sensing Intrinsic Amplitude: 18.375 mV
Lead Channel Sensing Intrinsic Amplitude: 4 mV
Lead Channel Sensing Intrinsic Amplitude: 4 mV
Lead Channel Setting Pacing Amplitude: 2 V
Lead Channel Setting Pacing Amplitude: 2 V
Lead Channel Setting Pacing Amplitude: 2.5 V
Lead Channel Setting Pacing Pulse Width: 0.4 ms
Lead Channel Setting Pacing Pulse Width: 0.8 ms
Lead Channel Setting Sensing Sensitivity: 0.3 mV
Zone Setting Status: 755011
Zone Setting Status: 755011

## 2023-03-24 NOTE — Patient Instructions (Signed)

## 2023-03-24 NOTE — Progress Notes (Signed)
 HPI Brenda Marsh returns today for followup. She is a pleasant 78 yo woman with breast CA,s/p chemotherapy, LV dysfunction on maximal medical therapy, s/p biv ICD insertion. She is s/p Biv ICD gen change out and returns for additional eval.  She denies chest pain or sob. Her lymphedema is better.   Allergies  Allergen Reactions   Atorvastatin Other (See Comments)    Myalgia, weakness   Naproxen Sodium Shortness Of Breath    Difficulty breathing   Shellfish Allergy Shortness Of Breath    Cant breath    Contrast Media [Iodinated Contrast Media] Nausea And Vomiting   Crestor [Rosuvastatin] Other (See Comments)    Myalgia, weakness   Metrizamide Nausea And Vomiting   Lisinopril Swelling and Cough    Difficulty breathing   Penicillins Rash    Rash on arms   Simvastatin Other (See Comments)    Myalgia, weakness     Current Outpatient Medications  Medication Sig Dispense Refill   Ascorbic Acid (VITAMIN C PO) Take 1 tablet by mouth every evening.     bisoprolol (ZEBETA) 5 MG tablet TAKE 1/2 TABLET (2.5 MG TOTAL) BY MOUTH DAILY 45 tablet 2   Cholecalciferol (VITAMIN D3) 50 MCG (2000 UT) TABS Take 6,000 Units by mouth every evening.     fexofenadine (ALLEGRA) 180 MG tablet Take 180 mg by mouth daily.     meclizine (ANTIVERT) 12.5 MG tablet Take 1 tablet (12.5 mg total) by mouth 3 (three) times daily as needed for dizziness. 90 tablet 3   Multiple Vitamin (MULTIVITAMIN WITH MINERALS) TABS tablet Take 1 tablet by mouth every evening.     spironolactone (ALDACTONE) 25 MG tablet Take 0.5 tablets (12.5 mg total) by mouth daily. 45 tablet 2   vitamin C (ASCORBIC ACID) 500 MG tablet Take 500 mg by mouth every evening.     Zinc 50 MG TABS Take 50 mg by mouth every other day. In the evening     No current facility-administered medications for this visit.     Past Medical History:  Diagnosis Date   AICD (automatic cardioverter/defibrillator) present    Allergic state 05/17/2016   Dr  Dellis Anes asthma and allergy specialist.   Allergy    Arthritis    left knee pain, MRI in 2008-tricompartmental  degenerate changes, mucoid degeneration of ACL, post horn meniscal tear and ant horn meniscal tear   Asthma    Breast cancer in female [facility]) 2000   left; S/P lumpectomy, radiation and chemotherapy   CHF (congestive heart failure) (HCC)    Colon polyp    unclear pathology   Hyperlipidemia    Hypertension    LBBB (left bundle branch block)    Low back pain    left L3-4 injected   Lymphedema of left arm    Nonischemic cardiomyopathy (HCC)    Presence of permanent cardiac pacemaker    Preventative health care 05/18/2016   Travel sickness 05/12/2015   Unspecified constipation 04/22/2012   Vertigo     ROS:   All systems reviewed and negative except as noted in the HPI.   Past Surgical History:  Procedure Laterality Date   APPENDECTOMY  2009   Dr. Ezzard Standing   BI-VENTRICULAR IMPLANTABLE CARDIOVERTER DEFIBRILLATOR N/A 11/16/2013   Procedure: BI-VENTRICULAR IMPLANTABLE CARDIOVERTER DEFIBRILLATOR  (CRT-D);  Surgeon: Marinus Maw, MD;  Location: [facility] CATH LAB;  Service: Cardiovascular;  Laterality: N/A;   BI-VENTRICULAR IMPLANTABLE CARDIOVERTER DEFIBRILLATOR  (CRT-D)  11/16/13   MDT CRTD implanted  by Dr Ladona Ridgel for NICM, CHF, and LBBB   BIV ICD GENERATOR CHANGEOUT N/A 06/19/2020   Procedure: BIV ICD GENERATOR CHANGEOUT;  Surgeon: Marinus Maw, MD;  [facility] INVAS[facility] CV LAB;  Service: Cardiovascular;  Laterality: N/A;   BREAST BIOPSY Left 2000   BREAST LUMPECTOMY Left 2000   CARDIAC CATHETERIZATION  ~ 04-04-08   "no blockage"   CARPAL TUNNEL RELEASE Bilateral 2005-04-04   Dr. Teressa Senter   COLONOSCOPY     KNEE ARTHROSCOPY Right    POLYPECTOMY     TONSILLECTOMY     TRIGGER FINGER RELEASE Bilateral 04-04-05   VAGINAL HYSTERECTOMY     Paritial     Family History  Problem Relation Age of Onset   Heart attack Mother 36   Diabetes Sister    Diabetes Brother    Colon cancer Maternal Aunt     Learning disabilities Paternal Uncle    Coronary artery disease Maternal Grandmother    Cancer Daughter        breast cancer   Parkinson's disease Daughter    Thyroid disease Daughter    Hypertension Daughter    Diabetes Daughter    Coronary artery disease Other        Female first degree relative <60   Hyperlipidemia Other    Hypertension Other    Cancer Other        prostate, 1st degree relative <50   Allergic rhinitis Neg Hx    Angioedema Neg Hx    Asthma Neg Hx    Eczema Neg Hx    Immunodeficiency Neg Hx    Urticaria Neg Hx    Stomach cancer Neg Hx    Rectal cancer Neg Hx    Esophageal cancer Neg Hx    Liver cancer Neg Hx    Colon polyps Neg Hx    Neuropathy Neg Hx      Social History   Socioeconomic History   Marital status: Widowed    Spouse name: Not on file   Number of children: 2   Years of education: 14   Highest education level: Not on file  Occupational History    Employer: DISABLED  Tobacco Use   Smoking status: Former    Current packs/day: 0.00    Average packs/day: 0.1 packs/day for 2.0 years (0.2 ttl pk-yrs)    Types: Cigarettes    Start date: 01/19/1968    Quit date: 01/18/1970    Years since quitting: 53.2   Smokeless tobacco: Never   Tobacco comments:    "smoked maybe 1 pack/month when I did smoke  Vaping Use   Vaping status: Never Used  Substance and Sexual Activity   Alcohol use: No   Drug use: No   Sexual activity: Never    Birth control/protection: Surgical, Post-menopausal  Other Topics Concern   Not on file  Social History Narrative   Widow - husband died in 2003/04/05   Occupation - retired from working in Museum/gallery curator   2 daughter - on lives in [place], other in [place]   Remote history of tobacco but none in 40 years   Caffeine- coffee, 1 cup daily; 1 energy drink daily (V8)   Social Drivers of Health   Financial Resource Strain: Medium Risk (09/08/2022)   Overall Financial Resource Strain (CARDIA)    Difficulty of Paying  Living Expenses: Somewhat hard  Food Insecurity: No Food Insecurity (09/08/2022)   Hunger Vital Sign    Worried About Running Out of Food in the Last  Year: Never true    Ran Out of Food in the Last Year: Never true  Transportation Needs: No Transportation Needs (09/08/2022)   PRAPARE - Administrator, Civil Service (Medical): No    Lack of Transportation (Non-Medical): No  Physical Activity: Inactive (09/08/2022)   Exercise Vital Sign    Days of Exercise per Week: 0 days    Minutes of Exercise per Session: 0 min  Stress: No Stress Concern Present (09/08/2022)   [employer] of Occupational Health - Occupational Stress Questionnaire    Feeling of Stress : Not at all  Social Connections: Moderately Integrated (09/08/2022)   Social Connection and Isolation Panel [NHANES]    Frequency of Communication with Friends and Family: More than three times a week    Frequency of Social Gatherings with Friends and Family: Once a week    Attends Religious Services: More than 4 times per year    Active Member of [employer] or Organizations: Yes    Attends Banker Meetings: More than 4 times per year    Marital Status: Widowed  Intimate Partner Violence: Not At Risk (09/08/2022)   Humiliation, Afraid, Rape, and Kick questionnaire    Fear of Current or Ex-Partner: No    Emotionally Abused: No    Physically Abused: No    Sexually Abused: No     BP 134/72   Pulse 89   Ht 5\' 4"  (1.626 m)   Wt 222 lb (100.7 kg)   SpO2 98%   BMI 38.11 kg/m   Physical Exam:  Well appearing NAD HEENT: Unremarkable Neck:  No JVD, no thyromegally Lymphatics:  No adenopathy Back:  No CVA tenderness Lungs:  Clear with no wheezes HEART:  Regular rate rhythm, no murmurs, no rubs, no clicks Abd:  soft, positive bowel sounds, no organomegally, no rebound, no guarding Ext:  2 plus pulses, left arm edema, no cyanosis, no clubbing Skin:  No rashes no nodules Neuro:  CN II through XII intact, motor  grossly intact  EKG - NSR with biv pacing  DEVICE  Normal device function.  See PaceArt for details.   Assess/Plan:  Chronic systolic heart failure - she is class 2. She will continue her current meds.  2. Obesity - I encouraged the patient to increase her physical activity.  3. ICD - she is s/p biv gen change out and doing well. Normal device function. 4. HTN - her bp is elevated a bit but is better at home. She is encouraged to reduce her salt intake.   Sharlot Gowda Korvin Valentine,MD

## 2023-03-25 NOTE — Progress Notes (Signed)
 HPI: FU dilated cardiomyopathy. Patient has a history of left bundle branch block. She underwent cardiac catheterization in [facility] in March of 2010 for reduced LV function. Ejection fraction was 35%. The left main was normal. The LAD had a proximal 25-30% lesion. The circumflex was normal. The right coronary artery had a 15% lesion. Patient was treated medically. Echocardiogram in August 2015 showed an ejection fraction of 25-30% and restrictive filling. Had biventricular ICD October 2015. Carotid Dopplers August 2019 showed less than 50% bilaterally.  Echocardiogram April 2023 showed ejection fraction 45 to 50%, grade 1 diastolic dysfunction, mild left atrial enlargement, trace aortic insufficiency..  Since she was last seen, she has dyspnea with more vigorous activities but not routine activities.  No orthopnea, PND, pedal edema, exertional chest pain or syncope.  Current Outpatient Medications  Medication Sig Dispense Refill   Ascorbic Acid (VITAMIN C PO) Take 1 tablet by mouth every evening.     bisoprolol (ZEBETA) 5 MG tablet TAKE 1/2 TABLET (2.5 MG TOTAL) BY MOUTH DAILY 45 tablet 2   Cholecalciferol (VITAMIN D3) 50 MCG (2000 UT) TABS Take 6,000 Units by mouth every evening.     fexofenadine (ALLEGRA) 180 MG tablet Take 180 mg by mouth daily.     meclizine (ANTIVERT) 12.5 MG tablet Take 1 tablet (12.5 mg total) by mouth 3 (three) times daily as needed for dizziness. 90 tablet 3   Multiple Vitamin (MULTIVITAMIN WITH MINERALS) TABS tablet Take 1 tablet by mouth every evening.     spironolactone (ALDACTONE) 25 MG tablet Take 0.5 tablets (12.5 mg total) by mouth daily. 45 tablet 2   vitamin C (ASCORBIC ACID) 500 MG tablet Take 500 mg by mouth every evening.     Zinc 50 MG TABS Take 50 mg by mouth every other day. In the evening     No current facility-administered medications for this visit.     Past Medical History:  Diagnosis Date   AICD (automatic cardioverter/defibrillator)  present    Allergic state 05/17/2016   Dr Dellis Anes asthma and allergy specialist.   Allergy    Arthritis    left knee pain, MRI in 2008-tricompartmental  degenerate changes, mucoid degeneration of ACL, post horn meniscal tear and ant horn meniscal tear   Asthma    Breast cancer in female [facility]) 2000   left; S/P lumpectomy, radiation and chemotherapy   CHF (congestive heart failure) (HCC)    Colon polyp    unclear pathology   Hyperlipidemia    Hypertension    LBBB (left bundle branch block)    Low back pain    left L3-4 injected   Lymphedema of left arm    Nonischemic cardiomyopathy (HCC)    Presence of permanent cardiac pacemaker    Preventative health care 05/18/2016   Travel sickness 05/12/2015   Unspecified constipation 04/22/2012   Vertigo     Past Surgical History:  Procedure Laterality Date   APPENDECTOMY  2009   Dr. Ezzard Standing   BI-VENTRICULAR IMPLANTABLE CARDIOVERTER DEFIBRILLATOR N/A 11/16/2013   Procedure: BI-VENTRICULAR IMPLANTABLE CARDIOVERTER DEFIBRILLATOR  (CRT-D);  Surgeon: Marinus Maw, MD;  Location[facility] CATH LAB;  Service: Cardiovascular;  Laterality: N/A;   BI-VENTRICULAR IMPLANTABLE CARDIOVERTER DEFIBRILLATOR  (CRT-D)  11/16/13   MDT CRTD implanted by Dr Ladona Ridgel for NICM, CHF, and LBBB   BIV ICD GENERATOR CHANGEOUT N/A 06/19/2020   Procedure: BIV ICD GENERATOR CHANGEOUT;  Surgeon: Marinus Maw, MD;  Location: [facility] INVASIVE CV LAB;  Service: Cardiovascular;  Laterality: N/A;   BREAST BIOPSY Left 2000   BREAST LUMPECTOMY Left 2000   CARDIAC CATHETERIZATION  ~ 04-25-08   "no blockage"   CARPAL TUNNEL RELEASE Bilateral 04/25/2005   Dr. Teressa Senter   COLONOSCOPY     KNEE ARTHROSCOPY Right    POLYPECTOMY     TONSILLECTOMY     TRIGGER FINGER RELEASE Bilateral 04/25/05   VAGINAL HYSTERECTOMY     Paritial    Social History   Socioeconomic History   Marital status: Widowed    Spouse name: Not on file   Number of children: 2   Years of education: 14   Highest education level: Not  on file  Occupational History    Employer: DISABLED  Tobacco Use   Smoking status: Former    Current packs/day: 0.00    Average packs/day: 0.1 packs/day for 2.0 years (0.2 ttl pk-yrs)    Types: Cigarettes    Start date: 01/19/1968    Quit date: 01/18/1970    Years since quitting: 53.2   Smokeless tobacco: Never   Tobacco comments:    "smoked maybe 1 pack/month when I did smoke  Vaping Use   Vaping status: Never Used  Substance and Sexual Activity   Alcohol use: No   Drug use: No   Sexual activity: Never    Birth control/protection: Surgical, Post-menopausal  Other Topics Concern   Not on file  Social History Narrative   Widow - husband died in 26-Apr-2003   Occupation - retired from working in Museum/gallery curator   2 daughter - on lives in [place], other in [place]   Remote history of tobacco but none in 40 years   Caffeine- coffee, 1 cup daily; 1 energy drink daily (V8)   Social Drivers of Corporate investment banker Strain: Medium Risk (09/08/2022)   Overall Financial Resource Strain (CARDIA)    Difficulty of Paying Living Expenses: Somewhat hard  Food Insecurity: No Food Insecurity (09/08/2022)   Hunger Vital Sign    Worried About Running Out of Food in the Last Year: Never true    Ran Out of Food in the Last Year: Never true  Transportation Needs: No Transportation Needs (09/08/2022)   PRAPARE - Administrator, Civil Service (Medical): No    Lack of Transportation (Non-Medical): No  Physical Activity: Inactive (09/08/2022)   Exercise Vital Sign    Days of Exercise per Week: 0 days    Minutes of Exercise per Session: 0 min  Stress: No Stress Concern Present (09/08/2022)   [employer] of Occupational Health - Occupational Stress Questionnaire    Feeling of Stress : Not at all  Social Connections: Moderately Integrated (09/08/2022)   Social Connection and Isolation Panel [NHANES]    Frequency of Communication with Friends and Family: More than three times a week     Frequency of Social Gatherings with Friends and Family: Once a week    Attends Religious Services: More than 4 times per year    Active Member of [employer] or Organizations: Yes    Attends Banker Meetings: More than 4 times per year    Marital Status: Widowed  Intimate Partner Violence: Not At Risk (09/08/2022)   Humiliation, Afraid, Rape, and Kick questionnaire    Fear of Current or Ex-Partner: No    Emotionally Abused: No    Physically Abused: No    Sexually Abused: No    Family History  Problem Relation Age of Onset   Heart attack Mother  72   Diabetes Sister    Diabetes Brother    Colon cancer Maternal Aunt    Learning disabilities Paternal Uncle    Coronary artery disease Maternal Grandmother    Cancer Daughter        breast cancer   Parkinson's disease Daughter    Thyroid disease Daughter    Hypertension Daughter    Diabetes Daughter    Coronary artery disease Other        Female first degree relative <60   Hyperlipidemia Other    Hypertension Other    Cancer Other        prostate, 1st degree relative <50   Allergic rhinitis Neg Hx    Angioedema Neg Hx    Asthma Neg Hx    Eczema Neg Hx    Immunodeficiency Neg Hx    Urticaria Neg Hx    Stomach cancer Neg Hx    Rectal cancer Neg Hx    Esophageal cancer Neg Hx    Liver cancer Neg Hx    Colon polyps Neg Hx    Neuropathy Neg Hx     ROS: no fevers or chills, productive cough, hemoptysis, dysphasia, odynophagia, melena, hematochezia, dysuria, hematuria, rash, seizure activity, orthopnea, PND, pedal edema, claudication. Remaining systems are negative.  Physical Exam: Well-developed well-nourished in no acute distress.  Skin is warm and dry.  HEENT is normal.  Neck is supple.  Chest is clear to auscultation with normal expansion.  Cardiovascular exam is regular rate and rhythm.  Abdominal exam nontender or distended. No masses palpated. Extremities show no edema. neuro grossly intact   A/P  1  history of cardiomyopathy-LV function mildly reduced on most recent echocardiogram.  Patient previously did not tolerate ACE inhibitor or ARB and she declined hydralazine/nitrates.  Continue beta-blocker.  2 hyperlipidemia-intolerant to statins and Zetia and unwilling to try PCSK9 inhibitors.  Check lipids.  3 hypertension-blood pressure elevated;   4 chronic combined systolic/diastolic congestive heart failure-she is euvolemic on examination.  Will continue spironolactone at present dose.  We discussed SGLT2 inhibitor but she declined.  She is not interested in any continue other medications.  Check potassium and renal function.  5 ICD-per electrophysiology.  6 carotid artery disease-mild on most recent Dopplers.  [name], MD

## 2023-04-06 ENCOUNTER — Ambulatory Visit: Payer: No Typology Code available for payment source | Attending: Cardiology | Admitting: Cardiology

## 2023-04-06 ENCOUNTER — Encounter: Payer: Self-pay | Admitting: Cardiology

## 2023-04-06 VITALS — BP 147/76 | HR 78 | Ht 64.0 in | Wt 220.0 lb

## 2023-04-06 DIAGNOSIS — I10 Essential (primary) hypertension: Secondary | ICD-10-CM

## 2023-04-06 DIAGNOSIS — I428 Other cardiomyopathies: Secondary | ICD-10-CM | POA: Diagnosis not present

## 2023-04-06 DIAGNOSIS — I5042 Chronic combined systolic (congestive) and diastolic (congestive) heart failure: Secondary | ICD-10-CM | POA: Diagnosis not present

## 2023-04-06 DIAGNOSIS — E782 Mixed hyperlipidemia: Secondary | ICD-10-CM | POA: Diagnosis not present

## 2023-04-06 DIAGNOSIS — Z95810 Presence of automatic (implantable) cardiac defibrillator: Secondary | ICD-10-CM | POA: Diagnosis not present

## 2023-04-06 NOTE — Patient Instructions (Signed)
    Lab Work:  Your physician recommends that you return for lab FASTING  [employer]  Located on the 3 rd floor in [address] Hours-Monday - Friday 8 am-11:30 AM and 1 pm -4 pm   If you have labs (blood work) drawn today and your tests are completely normal, you will receive your results only by: MyChart Message (if you have MyChart) OR A paper copy in the mail If you have any lab test that is abnormal or we need to change your treatment, we will call you to review the results.   Follow-Up: At [facility], you and your health needs are our priority.  As part of our continuing mission to provide you with exceptional heart care, we have created designated Provider Care Teams.  These Care Teams include your primary Cardiologist (physician) and Advanced Practice Providers (APPs -  Physician Assistants and Nurse Practitioners) who all work together to provide you with the care you need, when you need it.   Your next appointment:   12 month(s)  Provider:   [name], MD

## 2023-04-07 DIAGNOSIS — E782 Mixed hyperlipidemia: Secondary | ICD-10-CM | POA: Diagnosis not present

## 2023-04-07 DIAGNOSIS — I5042 Chronic combined systolic (congestive) and diastolic (congestive) heart failure: Secondary | ICD-10-CM | POA: Diagnosis not present

## 2023-04-08 ENCOUNTER — Encounter: Payer: Self-pay | Admitting: *Deleted

## 2023-04-08 LAB — BASIC METABOLIC PANEL
BUN/Creatinine Ratio: 10 — ABNORMAL LOW (ref 12–28)
BUN: 8 mg/dL (ref 8–27)
CO2: 22 mmol/L (ref 20–29)
Calcium: 9.7 mg/dL (ref 8.7–10.3)
Chloride: 105 mmol/L (ref 96–106)
Creatinine, Ser: 0.84 mg/dL (ref 0.57–1.00)
Glucose: 87 mg/dL (ref 70–99)
Potassium: 4.4 mmol/L (ref 3.5–5.2)
Sodium: 141 mmol/L (ref 134–144)
eGFR: 72 mL/min/{1.73_m2} (ref >59)

## 2023-04-08 LAB — LIPID PANEL
Chol/HDL Ratio: 6.9 ratio — ABNORMAL HIGH (ref 0.0–4.4)
Cholesterol, Total: 282 mg/dL — ABNORMAL HIGH (ref 100–199)
HDL: 41 mg/dL (ref >39)
LDL Chol Calc (NIH): 215 mg/dL — ABNORMAL HIGH (ref 0–99)
Triglycerides: 140 mg/dL (ref 0–149)
VLDL Cholesterol Cal: 26 mg/dL (ref 5–40)

## 2023-04-19 NOTE — Progress Notes (Signed)
 Remote ICD transmission.

## 2023-04-19 NOTE — Addendum Note (Signed)
 Addended by: Elease Etienne A on: 04/19/2023 08:58 AM   Modules accepted: Orders

## 2023-04-26 NOTE — Progress Notes (Signed)
 Remote ICD transmission.

## 2023-04-26 NOTE — Addendum Note (Signed)
 Addended by: Geralyn Flash D on: 04/26/2023 08:54 AM   Modules accepted: Orders

## 2023-06-21 ENCOUNTER — Ambulatory Visit (INDEPENDENT_AMBULATORY_CARE_PROVIDER_SITE_OTHER): Payer: Self-pay

## 2023-06-21 DIAGNOSIS — I5042 Chronic combined systolic (congestive) and diastolic (congestive) heart failure: Secondary | ICD-10-CM

## 2023-06-21 LAB — CUP PACEART REMOTE DEVICE CHECK
Battery Remaining Longevity: 39 mo
Battery Voltage: 2.96 V
Brady Statistic AP VP Percent: 0.04 %
Brady Statistic AP VS Percent: 0.01 %
Brady Statistic AS VP Percent: 98.72 %
Brady Statistic AS VS Percent: 1.22 %
Brady Statistic RA Percent Paced: 0.06 %
Brady Statistic RV Percent Paced: 92.63 %
Date Time Interrogation Session: 20250603044224
HighPow Impedance: 73 Ohm
Implantable Lead Connection Status: 753985
Implantable Lead Connection Status: 753985
Implantable Lead Connection Status: 753985
Implantable Lead Implant Date: 20151030
Implantable Lead Implant Date: 20151030
Implantable Lead Implant Date: 20151030
Implantable Lead Location: 753858
Implantable Lead Location: 753859
Implantable Lead Location: 753860
Implantable Lead Model: 4598
Implantable Lead Model: 5076
Implantable Lead Model: 6935
Implantable Pulse Generator Implant Date: 20220602
Lead Channel Impedance Value: 1064 Ohm
Lead Channel Impedance Value: 1178 Ohm
Lead Channel Impedance Value: 1197 Ohm
Lead Channel Impedance Value: 247.704
Lead Channel Impedance Value: 253.786
Lead Channel Impedance Value: 264.733
Lead Channel Impedance Value: 304 Ohm
Lead Channel Impedance Value: 330.057
Lead Channel Impedance Value: 340.944
Lead Channel Impedance Value: 361 Ohm
Lead Channel Impedance Value: 418 Ohm
Lead Channel Impedance Value: 456 Ohm
Lead Channel Impedance Value: 608 Ohm
Lead Channel Impedance Value: 646 Ohm
Lead Channel Impedance Value: 722 Ohm
Lead Channel Impedance Value: 893 Ohm
Lead Channel Impedance Value: 931 Ohm
Lead Channel Impedance Value: 988 Ohm
Lead Channel Pacing Threshold Amplitude: 0.625 V
Lead Channel Pacing Threshold Amplitude: 0.875 V
Lead Channel Pacing Threshold Amplitude: 1.875 V
Lead Channel Pacing Threshold Pulse Width: 0.4 ms
Lead Channel Pacing Threshold Pulse Width: 0.4 ms
Lead Channel Pacing Threshold Pulse Width: 0.8 ms
Lead Channel Sensing Intrinsic Amplitude: 22.75 mV
Lead Channel Sensing Intrinsic Amplitude: 22.75 mV
Lead Channel Sensing Intrinsic Amplitude: 3.75 mV
Lead Channel Sensing Intrinsic Amplitude: 3.75 mV
Lead Channel Setting Pacing Amplitude: 2 V
Lead Channel Setting Pacing Amplitude: 2 V
Lead Channel Setting Pacing Amplitude: 2.5 V
Lead Channel Setting Pacing Pulse Width: 0.4 ms
Lead Channel Setting Pacing Pulse Width: 0.8 ms
Lead Channel Setting Sensing Sensitivity: 0.3 mV
Zone Setting Status: 755011
Zone Setting Status: 755011

## 2023-06-23 ENCOUNTER — Ambulatory Visit: Payer: Self-pay | Admitting: Internal Medicine

## 2023-07-13 DIAGNOSIS — T466X5A Adverse effect of antihyperlipidemic and antiarteriosclerotic drugs, initial encounter: Secondary | ICD-10-CM | POA: Diagnosis not present

## 2023-07-13 DIAGNOSIS — Z950 Presence of cardiac pacemaker: Secondary | ICD-10-CM | POA: Diagnosis not present

## 2023-07-13 DIAGNOSIS — I110 Hypertensive heart disease with heart failure: Secondary | ICD-10-CM | POA: Diagnosis not present

## 2023-07-13 DIAGNOSIS — Z008 Encounter for other general examination: Secondary | ICD-10-CM | POA: Diagnosis not present

## 2023-07-13 DIAGNOSIS — Z6839 Body mass index (BMI) 39.0-39.9, adult: Secondary | ICD-10-CM | POA: Diagnosis not present

## 2023-07-13 DIAGNOSIS — I4891 Unspecified atrial fibrillation: Secondary | ICD-10-CM | POA: Diagnosis not present

## 2023-07-13 DIAGNOSIS — Z853 Personal history of malignant neoplasm of breast: Secondary | ICD-10-CM | POA: Diagnosis not present

## 2023-07-13 DIAGNOSIS — Z95810 Presence of automatic (implantable) cardiac defibrillator: Secondary | ICD-10-CM | POA: Diagnosis not present

## 2023-07-13 DIAGNOSIS — E785 Hyperlipidemia, unspecified: Secondary | ICD-10-CM | POA: Diagnosis not present

## 2023-08-16 NOTE — Progress Notes (Signed)
 Remote ICD transmission.

## 2023-09-06 DIAGNOSIS — M722 Plantar fascial fibromatosis: Secondary | ICD-10-CM | POA: Diagnosis not present

## 2023-09-08 ENCOUNTER — Other Ambulatory Visit: Payer: Self-pay | Admitting: Cardiology

## 2023-09-12 ENCOUNTER — Encounter

## 2023-09-12 ENCOUNTER — Telehealth: Payer: Self-pay | Admitting: *Deleted

## 2023-09-12 ENCOUNTER — Ambulatory Visit (INDEPENDENT_AMBULATORY_CARE_PROVIDER_SITE_OTHER): Admitting: *Deleted

## 2023-09-12 VITALS — Ht 64.0 in | Wt 215.0 lb

## 2023-09-12 DIAGNOSIS — R739 Hyperglycemia, unspecified: Secondary | ICD-10-CM

## 2023-09-12 DIAGNOSIS — Z0000 Encounter for general adult medical examination without abnormal findings: Secondary | ICD-10-CM | POA: Diagnosis not present

## 2023-09-12 NOTE — Patient Instructions (Signed)
 Ms. Brenda Marsh , Thank you for taking time out of your busy schedule to complete your Annual Wellness Visit with me. I enjoyed our conversation and look forward to speaking with you again next year. I, as well as your care team,  appreciate your ongoing commitment to your health goals. Please review the following plan we discussed and let me know if I can assist you in the future. Your Game plan/ To Do List    I hope your foot will be feeling better soon!  Follow up Visits: Next Medicare AWV with our clinical staff: 09/12/24 3pm, telephone.    Next Office Visit with your provider:  09/21/23 3pm, Harlene Jolly, NP.  Clinician Recommendations:  Aim for 30 minutes of exercise or brisk walking, 6-8 glasses of water, and 5 servings of fruits and vegetables each day.   You will need to get the following vaccines at your local pharmacy: FLU      This is a list of the screening recommended for you and due dates:  Health Maintenance  Topic Date Due   Complete foot exam   Never done   Pneumococcal Vaccine for age over 79 (1 of 2 - PCV) Never done   Zoster (Shingles) Vaccine (1 of 2) Never done   Yearly kidney health urinalysis for diabetes  01/15/2012   DTaP/Tdap/Td vaccine (2 - Tdap) 09/05/2016   COVID-19 Vaccine (7 - 2024-25 season) 09/19/2022   Eye exam for diabetics  11/12/2022   Hemoglobin A1C  05/16/2023   Medicare Annual Wellness Visit  09/08/2023   Flu Shot  09/11/2024*   Yearly kidney function blood test for diabetes  04/06/2024   DEXA scan (bone density measurement)  Completed   Hepatitis C Screening  Completed   HPV Vaccine  Aged Out   Meningitis B Vaccine  Aged Out   Colon Cancer Screening  Discontinued  *Topic was postponed. The date shown is not the original due date.    Advanced directives: (In Chart) A copy of your advanced directives are scanned into your chart should your provider ever need it. Advance Care Planning is important because it:  [x]  Makes sure you receive  the medical care that is consistent with your values, goals, and preferences  [x]  It provides guidance to your family and loved ones and reduces their decisional burden about whether or not they are making the right decisions based on your wishes.  Follow the link provided in your after visit summary or read over the paperwork we have mailed to you to help you started getting your Advance Directives in place. If you need assistance in completing these, please reach out to us  so that we can help you!  See attachments for Preventive Care and Fall Prevention Tips.

## 2023-09-12 NOTE — Progress Notes (Signed)
 Subjective:   Brenda Marsh is a 78 y.o. who presents for a Medicare Wellness preventive visit.  As a reminder, Annual Wellness Visits don't include a physical exam, and some assessments may be limited, especially if this visit is performed virtually. We may recommend an in-person follow-up visit with your provider if needed.  Visit Complete: Virtual I connected with  Brenda SHAUNNA Bohr on 09/12/23 by a audio enabled telemedicine application and verified that I am speaking with the correct person using two identifiers.  Patient Location: Home  Provider Location: Office/Clinic  I discussed the limitations of evaluation and management by telemedicine. The patient expressed understanding and agreed to proceed.  Vital Signs: Because this visit was a virtual/telehealth visit, some criteria may be missing or patient reported. Any vitals not documented were not able to be obtained and vitals that have been documented are patient reported.  VideoDeclined- This patient declined Librarian, academic. Therefore the visit was completed with audio only.  Persons Participating in Visit: Patient.  AWV Questionnaire: No: Patient Medicare AWV questionnaire was not completed prior to this visit.  Cardiac Risk Factors include: advanced age (>75men, >66 women);diabetes mellitus;dyslipidemia;hypertension;Other (see comment), Risk factor comments: History of breast cancer, CAD, cardiomyopathy     Objective:    Today's Vitals   09/12/23 1500  Weight: 215 lb (97.5 kg)  Height: 5' 4 (1.626 m)   Body mass index is 36.9 kg/m.     09/12/2023    3:34 PM 11/15/2022    9:52 AM 09/08/2022    1:11 PM 10/29/2021   12:07 PM 09/02/2021    1:06 PM 10/29/2020   12:24 PM 08/25/2020    1:08 PM  Advanced Directives  Does Patient Have a Medical Advance Directive? Yes No Yes Yes Yes Yes Yes  Type of Estate agent of Brenda Marsh;Living will  Living will;Healthcare  Power of [place];Out of facility DNR (pink MOST or yellow form) Healthcare Power of [place];Living will Healthcare Power of [place];Living will Living will;Healthcare Power of [employer] Power of [place];Living will  Does patient want to make changes to medical advance directive? No - Patient declined  No - Patient declined  No - Patient declined No - Patient declined   Copy of Healthcare Power of Attorney in Chart? Yes - validated most recent copy scanned in chart (See row information)  No - copy requested No - copy requested No - copy requested No - copy requested No - copy requested    Current Medications (verified) Outpatient Encounter Medications as of 09/12/2023  Medication Sig   bisoprolol  (ZEBETA ) 5 MG tablet TAKE 1/2 TABLET (2.5 MG TOTAL) BY MOUTH DAILY   Cholecalciferol  (VITAMIN D3) 50 MCG (2000 UT) TABS Take 6,000 Units by mouth every evening.   meclizine  (ANTIVERT ) 12.5 MG tablet Take 1 tablet (12.5 mg total) by mouth 3 (three) times daily as needed for dizziness.   Multiple Vitamin (MULTIVITAMIN WITH MINERALS) TABS tablet Take 1 tablet by mouth every evening.   spironolactone  (ALDACTONE ) 25 MG tablet Take 0.5 tablets (12.5 mg total) by mouth daily.   vitamin C (ASCORBIC ACID) 500 MG tablet Take 500 mg by mouth every evening.   fexofenadine (ALLEGRA) 180 MG tablet Take 180 mg by mouth daily. (Patient not taking: Reported on 09/12/2023)   Zinc 50 MG TABS Take 50 mg by mouth every other day. In the evening (Patient not taking: Reported on 09/12/2023)   [DISCONTINUED] Ascorbic Acid (VITAMIN C PO) Take 1 tablet by mouth  every evening.   No facility-administered encounter medications on file as of 09/12/2023.    Allergies (verified) Atorvastatin, Naproxen sodium, Shellfish allergy , Contrast media [iodinated contrast media], Crestor [rosuvastatin], Metrizamide, Lisinopril, Penicillins, and Simvastatin   History: Past Medical History:  Diagnosis Date   AICD (automatic  cardioverter/defibrillator) present    Allergic state 05/17/2016   Dr Iva asthma and allergy  specialist.   Allergy     Arthritis    left knee pain, MRI in 2008-tricompartmental  degenerate changes, mucoid degeneration of ACL, post horn meniscal tear and ant horn meniscal tear   Asthma    Breast cancer in female Brenda Marsh) 2000   left; S/P lumpectomy, radiation and chemotherapy   CHF (congestive heart failure) (HCC)    Colon polyp    unclear pathology   Hyperlipidemia    Hypertension    LBBB (left bundle branch block)    Low back pain    left L3-4 injected   Lymphedema of left arm    Nonischemic cardiomyopathy (HCC)    Presence of permanent cardiac pacemaker    Preventative health care 05/18/2016   Travel sickness 05/12/2015   Unspecified constipation 04/22/2012   Vertigo    Past Surgical History:  Procedure Laterality Date   APPENDECTOMY  2009   Dr. Ethyl   BI-VENTRICULAR IMPLANTABLE CARDIOVERTER DEFIBRILLATOR N/A 11/16/2013   Procedure: BI-VENTRICULAR IMPLANTABLE CARDIOVERTER DEFIBRILLATOR  (CRT-D);  Surgeon: Brenda LELON [place], MD;  Location: [facility] CATH LAB;  Service: Cardiovascular;  Laterality: N/A;   BI-VENTRICULAR IMPLANTABLE CARDIOVERTER DEFIBRILLATOR  (CRT-D)  11/16/2013   MDT CRTD implanted by Dr Marsh for NICM, CHF, and LBBB   BIV ICD GENERATOR CHANGEOUT N/A 06/19/2020   Procedure: BIV ICD GENERATOR CHANGEOUT;  Surgeon: Marsh Brenda LELON, MD;  Location: [facility] INVASIVE CV LAB;  Service: Cardiovascular;  Laterality: N/A;   BREAST BIOPSY Left 2000   BREAST LUMPECTOMY Left 2000   CARDIAC CATHETERIZATION  ~ 2010   no blockage   CARPAL TUNNEL RELEASE Bilateral 2007   Dr. Leonor   CATARACT EXTRACTION W/ INTRAOCULAR LENS IMPLANT Left 01/2023   COLONOSCOPY     KNEE ARTHROSCOPY Right    POLYPECTOMY     TONSILLECTOMY     TRIGGER FINGER RELEASE Bilateral 2007   VAGINAL HYSTERECTOMY     Paritial   Family History  Problem Relation Age of Onset   Heart attack Brenda Marsh 62   Diabetes  Brenda Marsh    Diabetes Brenda Marsh    Colon cancer Brenda Marsh    Learning disabilities Brenda Marsh    Coronary artery disease Brenda Marsh    Cancer Daughter        breast cancer   Parkinson's disease Daughter    Thyroid  disease Daughter    Hypertension Daughter    Diabetes Daughter    Coronary artery disease Other        Female first degree relative <60   Hyperlipidemia Other    Hypertension Other    Cancer Other        prostate, 1st degree relative <50   Allergic rhinitis Neg Hx    Angioedema Neg Hx    Asthma Neg Hx    Eczema Neg Hx    Immunodeficiency Neg Hx    Urticaria Neg Hx    Stomach cancer Neg Hx    Rectal cancer Neg Hx    Esophageal cancer Neg Hx    Liver cancer Neg Hx    Colon polyps Neg Hx    Neuropathy Neg Hx    Social  History   Socioeconomic History   Marital status: Widowed    Spouse name: Not on file   Number of children: 2   Years of education: 14   Highest education level: Not on file  Occupational History    Employer: DISABLED  Tobacco Use   Smoking status: Former    Current packs/day: 0.00    Average packs/day: 0.1 packs/day for 2.0 years (0.2 ttl pk-yrs)    Types: Cigarettes    Start date: 01/19/1968    Quit date: 01/18/1970    Years since quitting: 53.6   Smokeless tobacco: Never   Tobacco comments:    smoked maybe 1 pack/month when I did smoke  Vaping Use   Vaping status: Never Used  Substance and Sexual Activity   Alcohol use: No   Drug use: No   Sexual activity: Never    Birth control/protection: Surgical, Post-menopausal  Other Topics Concern   Not on file  Social History Narrative   Widow - husband died in 2003-10-08   Occupation - retired from working in Museum/gallery curator   2 daughter - on lives in [place], other in [place]   Remote history of tobacco but none in 40 years   Caffeine- coffee, 1 cup daily; 1 energy drink daily (V8)   Social Drivers of Corporate investment banker Strain: High Risk (09/12/2023)   Overall Financial  Resource Strain (CARDIA)    Difficulty of Paying Living Expenses: Very hard  Food Insecurity: No Food Insecurity (09/12/2023)   Hunger Vital Sign    Worried About Running Out of Food in the Last Year: Never true    Ran Out of Food in the Last Year: Never true  Transportation Needs: No Transportation Needs (09/12/2023)   PRAPARE - Administrator, Civil Service (Medical): No    Lack of Transportation (Non-Medical): No  Physical Activity: Sufficiently Active (09/12/2023)   Exercise Vital Sign    Days of Exercise per Week: 3 days    Minutes of Exercise per Session: 60 min  Stress: Stress Concern Present (09/12/2023)   [employer] of Occupational Health - Occupational Stress Questionnaire    Feeling of Stress: To some extent  Social Connections: Moderately Integrated (09/12/2023)   Social Connection and Isolation Panel    Frequency of Communication with Friends and Family: More than three times a week    Frequency of Social Gatherings with Friends and Family: Once a week    Attends Religious Services: More than 4 times per year    Active Member of [employer] or Organizations: Yes    Attends Banker Meetings: More than 4 times per year    Marital Status: Widowed    Tobacco Counseling Counseling given: Not Answered Tobacco comments: smoked maybe 1 pack/month when I did smoke    Clinical Intake:  Pre-visit preparation completed: Yes  Pain : No/denies pain     BMI - recorded: 36.9 Nutritional Status: BMI > 30  Obese Nutritional Risks: None Diabetes: Yes CBG done?: No Did pt. bring in CBG monitor from home?: No  Lab Results  Component Value Date   HGBA1C 5.7 (H) 11/15/2022   HGBA1C 6.0 09/15/2021   HGBA1C 5.5 11/22/2019     How often do you need to have someone help you when you read instructions, pamphlets, or other written materials from your doctor or pharmacy?: 1 - Never What is the last grade level you completed in school?:  college  Interpreter Needed?: No  Information entered  by :: Lolita Libra, CMA   Activities of Daily Living     09/12/2023    3:10 PM  In your present state of health, do you have any difficulty performing the following activities:  Hearing? 0  Vision? 0  Difficulty concentrating or making decisions? 0  Walking or climbing stairs? 0  Dressing or bathing? 0  Doing errands, shopping? 0  Preparing Food and eating ? N  Using the Toilet? N  In the past six months, have you accidently leaked urine? N  Do you have problems with loss of bowel control? N  Managing your Medications? N  Managing your Finances? N  Housekeeping or managing your Housekeeping? N    Patient Care Team: Domenica Harlene LABOR, MD as PCP - General (Family Medicine) Waddell Brenda ORN, MD as Consulting Physician (Cardiology) Teressa Toribio SQUIBB, MD (Inactive) as Consulting Physician (Gastroenterology) Iva Marty Saltness, MD as Consulting Physician (Allergy  and Immunology) Timmy Maude SAUNDERS, MD as Consulting Physician (Oncology) Pietro Redell RAMAN, MD as Consulting Physician (Cardiology) Roark Rush, MD as Consulting Physician (Otolaryngology) [facility]  I have updated your Care Teams any recent Medical Services you may have received from other providers in the past year.     Assessment:   This is a routine wellness examination for [place].  Hearing/Vision screen Hearing Screening - Comments:: Denies hearing difficulties.  Vision Screening - Comments:: Up to date with routine eye exams with [facility].    Goals Addressed   None    Depression Screen     09/12/2023    3:15 PM 09/08/2022    1:22 PM 03/23/2022    2:10 PM 09/02/2021    1:07 PM 08/25/2020    1:11 PM 05/22/2019   11:02 AM 05/17/2016    1:43 PM  PHQ 2/9 Scores  PHQ - 2 Score 0 0 0 0 0 0 0  PHQ- 9 Score 0     2     Fall Risk     09/12/2023    3:08 PM 09/08/2022    1:09 PM 03/23/2022    2:10 PM 09/02/2021    1:07 PM  08/25/2020    1:10 PM  Fall Risk   Falls in the past year? 0 0 0 0 0  Number falls in past yr: 0 0 0 0 0  Injury with Fall? 0 0 0 0 0  Risk for fall due to : Impaired balance/gait No Fall Risks No Fall Risks No Fall Risks   Follow up Education provided Falls evaluation completed Falls evaluation completed Falls evaluation completed  Falls prevention discussed      Data saved with a previous flowsheet row definition    MEDICARE RISK AT HOME:  Medicare Risk at Home Any stairs in or around the home?: Yes If so, are there any without handrails?: No Home free of loose throw rugs in walkways, pet beds, electrical cords, etc?: Yes Adequate lighting in your home to reduce risk of falls?: Yes Life alert?: No Use of a cane, walker or w/c?: Yes (has cane/walker for recent foot issue but not typically needed) Grab bars in the bathroom?: No Shower chair or bench in shower?: No Elevated toilet seat or a handicapped toilet?: Yes  TIMED UP AND GO:  Was the test performed?  No,audio  Cognitive Function: 6CIT completed    05/17/2016    1:45 PM  MMSE - Mini Mental State Exam  Orientation to time 5   Orientation to  Place 5   Registration 3   Attention/ Calculation 5   Recall 2   Language- name 2 objects 2   Language- repeat 1  Language- follow 3 step command 3   Language- read & follow direction 1   Write a sentence 1   Copy design 1   Total score 29      Data saved with a previous flowsheet row definition        09/12/2023    3:17 PM 09/08/2022    1:24 PM 09/02/2021    1:16 PM  6CIT Screen  What Year? 0 points 0 points 0 points  What month? 0 points 0 points 0 points  What time? 0 points 0 points 0 points  Count back from 20 0 points 0 points 0 points  Months in reverse 2 points 0 points 2 points  Repeat phrase 2 points 0 points 0 points  Total Score 4 points 0 points 2 points    Immunizations Immunization History  Administered Date(s) Administered   PFIZER Comirnaty (Gray  Top)Covid-19 Tri-Sucrose Vaccine 05/22/2020   PFIZER(Purple Top)SARS-COV-2 Vaccination 02/24/2019, 03/17/2019, 09/15/2019   Pfizer Covid-19 Vaccine Bivalent Booster 35yrs & up 11/18/2020   Pfizer(Comirnaty )Fall Seasonal Vaccine 12 years and older 12/16/2021   Td 09/06/2006    Screening Tests Health Maintenance  Topic Date Due   FOOT EXAM  Never done   Pneumococcal Vaccine: 50+ Years (1 of 2 - PCV) Never done   Zoster Vaccines- Shingrix (1 of 2) Never done   Diabetic kidney evaluation - Urine ACR  01/15/2012   DTaP/Tdap/Td (2 - Tdap) 09/05/2016   COVID-19 Vaccine (7 - 2024-25 season) 09/19/2022   OPHTHALMOLOGY EXAM  11/12/2022   HEMOGLOBIN A1C  05/16/2023   Medicare Annual Wellness (AWV)  09/08/2023   INFLUENZA VACCINE  09/11/2024 (Originally 08/19/2023)   Diabetic kidney evaluation - eGFR measurement  04/06/2024   DEXA SCAN  Completed   Hepatitis C Screening  Completed   HPV VACCINES  Aged Out   Meningococcal B Vaccine  Aged Out   Colonoscopy  Discontinued    Health Maintenance  Health Maintenance Due  Topic Date Due   FOOT EXAM  Never done   Pneumococcal Vaccine: 50+ Years (1 of 2 - PCV) Never done   Zoster Vaccines- Shingrix (1 of 2) Never done   Diabetic kidney evaluation - Urine ACR  01/15/2012   DTaP/Tdap/Td (2 - Tdap) 09/05/2016   COVID-19 Vaccine (7 - 2024-25 season) 09/19/2022   OPHTHALMOLOGY EXAM  11/12/2022   HEMOGLOBIN A1C  05/16/2023   Medicare Annual Wellness (AWV)  09/08/2023   Health Maintenance Items Addressed: Declines flu vaccine, will get tetanus at pharmacy. Copy of most recent eye exam has been requested.  Other HM items will be addressed at upcoming OV.  Additional Screening:  Vision Screening: Recommended annual ophthalmology exams for early detection of glaucoma and other disorders of the eye. Would you like a referral to an eye doctor? No    Dental Screening: Recommended annual dental exams for proper oral hygiene  Community Resource Referral  / Chronic Care Management: CRR required this visit?  No   CCM required this visit?  No   Plan:    I have personally reviewed and noted the following in the patient's chart:   Medical and social history Use of alcohol, tobacco or illicit drugs  Current medications and supplements including opioid prescriptions. Patient is not currently taking opioid prescriptions. Functional ability and status Nutritional status Physical activity Advanced directives  List of other physicians Hospitalizations, surgeries, and ER visits in previous 12 months Vitals Screenings to include cognitive, depression, and falls Referrals and appointments  In addition, I have reviewed and discussed with patient certain preventive protocols, quality metrics, and best practice recommendations. A written personalized care plan for preventive services as well as general preventive health recommendations were provided to patient.   Lolita Libra, CMA   09/12/2023   After Visit Summary: (MyChart) Due to this being a telephonic visit, the after visit summary with patients personalized plan was offered to patient via MyChart   Notes: see phone note.

## 2023-09-12 NOTE — Telephone Encounter (Signed)
 Pt had AWV today.  She has diagnosis of diet controlled diabetes on her problem list. Can we verify this is correct?  If she is not, can we remove this dx as it is triggering eye exams, urine MALB (not done since 2012) and foot exams (never done).  I have scheduled pt a follow up with Jessica for 09/21/23. If pt is diabetic maybe we can catch these health maintenance items up at that visit.  Also, pt is currently going to [facility] about a problem with her left foot / tendon? And wanted to make you aware.  Has caused her significant pain and reduced mobility. Received injection today and has a boot to wear.

## 2023-09-13 NOTE — Telephone Encounter (Signed)
 I have spoken with pt and she voices understanding that labs have not supported diabetes diagnosis and she is ok with having that removed from her current problem list. Would you want us  to place hyperglycemia diagnosis in her record to continue monitoring the A1c?

## 2023-09-15 DIAGNOSIS — R739 Hyperglycemia, unspecified: Secondary | ICD-10-CM | POA: Insufficient documentation

## 2023-09-15 NOTE — Telephone Encounter (Signed)
 I have updated problem list. I added modifier to urine MALB that pt is not a candidate and test still shows in the care gap.

## 2023-09-16 NOTE — Telephone Encounter (Signed)
 Placed ticket with IT to see if they can get pt removed from the DM registry to resolve the remaining care gap.

## 2023-09-17 ENCOUNTER — Other Ambulatory Visit: Payer: Self-pay | Admitting: Cardiology

## 2023-09-17 DIAGNOSIS — I5022 Chronic systolic (congestive) heart failure: Secondary | ICD-10-CM

## 2023-09-19 NOTE — Progress Notes (Addendum)
 Subjective:     Patient ID: Brenda Marsh, female    DOB: 11-Jul-1945, 78 y.o.   MRN: [id number]  No chief complaint on file.   HPI  Brenda Marsh is  78 yo female presents for FU chronic conditions. Widower lives. Driving still. 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 She has five grandchildren and five great-grandchildren. She is attending her 60th high school reunion this weekend. Her oldest great-grandson recently got married last weekend, and her granddaughter.   Follows with Ortho, Cardiology, Ophthalmology, Oncology, Neurology  CHF- Follows with cardiology, Dr. Redell Shallow AICD present, Hx Left BBB- Dr. Waddell Spirnoloactone 25 mg (0.5 tab) daily Bisoprolol  (Zebeta ) 5 mg (0.5 tab) daily  Vertigo Meclizine  prn  HCM: -Mammogram: Last 10/2022 WNL, Scheduled for 10/2023 -DEXA: Last 10-2020; Repeat 2024 - Immunizations: Covid-19. Shingles, PNA-   Patient denies fever, chills, SOB, CP, palpitations, dyspnea, edema, HA, vision changes, N/V/D, abdominal pain, urinary symptoms, rash, weight changes, and recent illness or hospitalizations.     History of Present Illness              There are no preventive care reminders to display for this patient.   Past Medical History:  Diagnosis Date   AICD (automatic cardioverter/defibrillator) present    Allergic state 05/17/2016   Dr Iva asthma and allergy  specialist.   Allergy     Arthritis    left knee pain, MRI in 2008-tricompartmental  degenerate changes, mucoid degeneration of ACL, post horn meniscal tear and ant horn meniscal tear   Asthma    Breast cancer in female [facility]) 2000   left; S/P lumpectomy, radiation and chemotherapy   CHF (congestive heart failure) (HCC)    Colon polyp    unclear pathology   Hyperlipidemia    Hypertension    LBBB (left bundle branch block)    Low back pain    left L3-4 injected   Lymphedema of left arm    Nonischemic cardiomyopathy  (HCC)    Presence of permanent cardiac pacemaker    Preventative health care 05/18/2016   Travel sickness 05/12/2015   Unspecified constipation 04/22/2012   Vertigo     Past Surgical History:  Procedure Laterality Date   APPENDECTOMY  2009   Dr. Ethyl   BI-VENTRICULAR IMPLANTABLE CARDIOVERTER DEFIBRILLATOR N/A 11/16/2013   Procedure: BI-VENTRICULAR IMPLANTABLE CARDIOVERTER DEFIBRILLATOR  (CRT-D);  Surgeon: Danelle LELON Waddell, MD;  Location: [facility] CATH LAB;  Service: Cardiovascular;  Laterality: N/A;   BI-VENTRICULAR IMPLANTABLE CARDIOVERTER DEFIBRILLATOR  (CRT-D)  11/16/2013   MDT CRTD implanted by Dr Waddell for NICM, CHF, and LBBB   BIV ICD GENERATOR CHANGEOUT N/A 06/19/2020   Procedure: BIV ICD GENERATOR CHANGEOUT;  Surgeon: Waddell Danelle LELON, MD;  Location: [facility] INVASIVE CV LAB;  Service: Cardiovascular;  Laterality: N/A;   BREAST BIOPSY Left 2000   BREAST LUMPECTOMY Left 2000   CARDIAC CATHETERIZATION  ~ 2010   no blockage   CARPAL TUNNEL RELEASE Bilateral 2007   Dr. Leonor   CATARACT EXTRACTION W/ INTRAOCULAR LENS IMPLANT Left 01/2023   COLONOSCOPY     KNEE ARTHROSCOPY Right    POLYPECTOMY     TONSILLECTOMY     TRIGGER FINGER RELEASE Bilateral 2007   VAGINAL HYSTERECTOMY     Paritial    Family History  Problem Relation Age of Onset   Heart attack Mother 65   Diabetes Sister    Diabetes Brother    Colon cancer Maternal Aunt    Learning  disabilities Paternal Uncle    Coronary artery disease Maternal Grandmother    Cancer Daughter        breast cancer   Parkinson's disease Daughter    Thyroid  disease Daughter    Hypertension Daughter    Diabetes Daughter    Coronary artery disease Other        Female first degree relative <60   Hyperlipidemia Other    Hypertension Other    Cancer Other        prostate, 1st degree relative <50   Allergic rhinitis Neg Hx    Angioedema Neg Hx    Asthma Neg Hx    Eczema Neg Hx    Immunodeficiency Neg Hx    Urticaria Neg Hx    Stomach  cancer Neg Hx    Rectal cancer Neg Hx    Esophageal cancer Neg Hx    Liver cancer Neg Hx    Colon polyps Neg Hx    Neuropathy Neg Hx     Social History   Socioeconomic History   Marital status: Widowed    Spouse name: Not on file   Number of children: 2   Years of education: 14   Highest education level: Not on file  Occupational History    Employer: DISABLED  Tobacco Use   Smoking status: Former    Current packs/day: 0.00    Average packs/day: 0.1 packs/day for 2.0 years (0.2 ttl pk-yrs)    Types: Cigarettes    Start date: 01/19/1968    Quit date: 01/18/1970    Years since quitting: 53.7   Smokeless tobacco: Never   Tobacco comments:    smoked maybe 1 pack/month when I did smoke  Vaping Use   Vaping status: Never Used  Substance and Sexual Activity   Alcohol use: No   Drug use: No   Sexual activity: Never    Birth control/protection: Surgical, Post-menopausal  Other Topics Concern   Not on file  Social History Narrative   Widow - husband died in 10-13-2003   Occupation - retired from working in Museum/gallery curator   2 daughter - on lives in [place], other in [place]   Remote history of tobacco but none in 40 years   Caffeine- coffee, 1 cup daily; 1 energy drink daily (V8)   Social Drivers of Corporate investment banker Strain: High Risk (09/12/2023)   Overall Financial Resource Strain (CARDIA)    Difficulty of Paying Living Expenses: Very hard  Food Insecurity: No Food Insecurity (09/12/2023)   Hunger Vital Sign    Worried About Running Out of Food in the Last Year: Never true    Ran Out of Food in the Last Year: Never true  Transportation Needs: No Transportation Needs (09/12/2023)   PRAPARE - Administrator, Civil Service (Medical): No    Lack of Transportation (Non-Medical): No  Physical Activity: Sufficiently Active (09/12/2023)   Exercise Vital Sign    Days of Exercise per Week: 3 days    Minutes of Exercise per Session: 60 min  Stress: Stress Concern  Present (09/12/2023)   [employer] of Occupational Health - Occupational Stress Questionnaire    Feeling of Stress: To some extent  Social Connections: Moderately Integrated (09/12/2023)   Social Connection and Isolation Panel    Frequency of Communication with Friends and Family: More than three times a week    Frequency of Social Gatherings with Friends and Family: Once a week    Attends Religious Services: More  than 4 times per year    Active Member of Clubs or Organizations: Yes    Attends Banker Meetings: More than 4 times per year    Marital Status: Widowed  Intimate Partner Violence: Not At Risk (09/12/2023)   Humiliation, Afraid, Rape, and Kick questionnaire    Fear of Current or Ex-Partner: No    Emotionally Abused: No    Physically Abused: No    Sexually Abused: No    Outpatient Medications Prior to Visit  Medication Sig Dispense Refill   cyanocobalamin  (VITAMIN B12) 1000 MCG tablet Take 1,000 mcg by mouth daily.     bisoprolol  (ZEBETA ) 5 MG tablet TAKE 1/2 TABLET (2.5 MG TOTAL) BY MOUTH DAILY 45 tablet 1   Cholecalciferol  (VITAMIN D3) 50 MCG (2000 UT) TABS Take 6,000 Units by mouth every evening.     meclizine  (ANTIVERT ) 12.5 MG tablet Take 1 tablet (12.5 mg total) by mouth 3 (three) times daily as needed for dizziness. 90 tablet 3   Multiple Vitamin (MULTIVITAMIN WITH MINERALS) TABS tablet Take 1 tablet by mouth every evening.     spironolactone  (ALDACTONE ) 25 MG tablet TAKE 1/2 TABLET BY MOUTH EVERY DAY 45 tablet 1   vitamin C (ASCORBIC ACID) 500 MG tablet Take 500 mg by mouth every evening.     fexofenadine (ALLEGRA) 180 MG tablet Take 180 mg by mouth daily. (Patient not taking: Reported on 09/12/2023)     Zinc 50 MG TABS Take 50 mg by mouth every other day. In the evening (Patient not taking: Reported on 09/12/2023)     No facility-administered medications prior to visit.    Allergies  Allergen Reactions   Atorvastatin Other (See Comments)     Myalgia, weakness   Naproxen Sodium Shortness Of Breath    Difficulty breathing   Shellfish Allergy  Shortness Of Breath    Cant breath    Contrast Media [Iodinated Contrast Media] Nausea And Vomiting   Crestor [Rosuvastatin] Other (See Comments)    Myalgia, weakness   Metrizamide Nausea And Vomiting   Lisinopril Swelling and Cough    Difficulty breathing   Penicillins Rash    Rash on arms   Simvastatin Other (See Comments)    Myalgia, weakness    ROS    See HPI Objective:    Physical Exam  General: No acute distress. Awake and conversant. +obese Eyes: Normal conjunctiva, anicteric. Round symmetric pupils.  Respiratory: CTAB. Respirations are non-labored. No wheezing.  Skin: Warm. No rashes or ulcers.  +LUE lymphadema, RUE WNL Psych: Alert and oriented. Cooperative, Appropriate mood and affect, Normal judgment.  CV: RRR. No murmur. No lower extremity edema.  MSK: Normal ambulation. No clubbing or cyanosis.  Neuro:  CN II-XII grossly normal.      BP 125/62   Pulse 76   Ht 5' 4 (1.626 m)   Wt 215 lb 6.4 oz (97.7 kg)   BMI 36.97 kg/m  Wt Readings from Last 3 Encounters:  09/21/23 215 lb 6.4 oz (97.7 kg)  09/12/23 215 lb (97.5 kg)  04/06/23 220 lb (99.8 kg)       Assessment & Plan:   Problem List Items Addressed This Visit     Anemia   Update CBC      Relevant Medications   cyanocobalamin  (VITAMIN B12) 1000 MCG tablet   Other Relevant Orders   CBC with Differential/Platelet (Completed)   TSH (Completed)   Chronic combined systolic and diastolic CHF (congestive heart failure) (HCC)   NO recent exacerbations.  Follows with cardiology.       Class 2 severe obesity due to excess calories with serious comorbidity and body mass index (BMI) of 36.0 to 36.9 in adult [facility])   Encouraged DASH or MIND diet, decrease po intake and increase exercise as tolerated. Needs 7-8 hours of sleep nightly. Avoid trans fats, eat small, frequent meals every 4-5 hours with lean  proteins, complex carbs and healthy fats. Minimize simple carbs, high fat foods and processed foods          History of breast cancer (Chronic)   Follows with Dr. Timmy- Oncology/Hematology. Last [facility] 2024. Mgm Scheduled 10/2023.      Hyperlipidemia, mixed (Chronic)   Encourage heart healthy diet such as MIND or DASH diet, increase exercise, avoid trans fats, simple carbohydrates and processed foods, consider a krill or fish or flaxseed oil cap daily.  Follows with cardiology. Patient is intolerant to statins. Discussed alternative options, including Repatha, and reviewed the impact of cholesterol on ASCVD risk. Patient declines all cholesterol-lowering medications at this time.      Relevant Orders   Comprehensive metabolic panel with GFR (Completed)   Lipid panel (Completed)   TSH (Completed)   Osteopenia   Encouraged to get adequate exercise, calcium  and vitamin d  intake         Relevant Orders   TSH (Completed)   Preventative health care - Primary   Patient encouraged to maintain heart healthy diet, regular exercise, adequate sleep. Consider daily probiotics. Take medications as prescribed.  HCM: -Mammogram: Last 10/2022 WNL, Scheduled for 10/2023 -DEXA: Last 10-2020; Repeat 2024 - Immunizations: Patient declines  PNA, Shingles, Covid-19 vaccines at this time .She does not wish to receive these vaccines. Discontinued per Pt preference.       Other Visit Diagnoses       Postmenopausal estrogen deficiency       Relevant Orders   DG Bone Density      Right heel pain, chronic Getting injections from [facility]- Dr. Cleophus Heel pain has improved since getting injection. Continue to FU with Orthopedics.   HCM: -Mammogram: Last 10/2022 WNL, Repeat 10/ 2025 -DEXA: Last 10.2022; Repeat 2024 - Immunizations: Covid-19. Shingles, PNA  Portions of this note were dictated using DRAGON voice recognition software. Please disregard any errors in transcription.     I have discontinued Avelina SQUIBB. Aydelotte's Zinc and fexofenadine. I am also having her maintain her multivitamin with minerals, Vitamin D3, ascorbic acid, meclizine , bisoprolol , spironolactone , and cyanocobalamin .  No orders of the defined types were placed in this encounter.

## 2023-09-19 NOTE — Assessment & Plan Note (Signed)
 Encouraged DASH or MIND diet, decrease po intake and increase exercise as tolerated. Needs 7-8 hours of sleep nightly. Avoid trans fats, eat small, frequent meals every 4-5 hours with lean proteins, complex carbs and healthy fats. Minimize simple carbs, high fat foods and processed foods

## 2023-09-19 NOTE — Assessment & Plan Note (Signed)
 Patient encouraged to maintain heart healthy diet, regular exercise, adequate sleep. Consider daily probiotics. Take medications as prescribed.  HCM: -Mammogram: Last 10/2022 WNL, Scheduled for 10/2023 -DEXA: Last 10-2020; Repeat 2024 - Immunizations: Patient declines  PNA, Shingles, Covid-19 vaccines at this time .She does not wish to receive these vaccines. Discontinued per Pt preference.

## 2023-09-19 NOTE — Assessment & Plan Note (Signed)
 Follows with Dr. Timmy- Oncology/Hematology. Last [facility] 2024. Mgm Scheduled 10/2023.

## 2023-09-19 NOTE — Assessment & Plan Note (Signed)
 NO recent exacerbations. Follows with cardiology.

## 2023-09-19 NOTE — Assessment & Plan Note (Signed)
 Encourage heart healthy diet such as MIND or DASH diet, increase exercise, avoid trans fats, simple carbohydrates and processed foods, consider a krill or fish or flaxseed oil cap daily.  Follows with cardiology. Patient is intolerant to statins. Discussed alternative options, including Repatha, and reviewed the impact of cholesterol on ASCVD risk. Patient declines all cholesterol-lowering medications at this time.

## 2023-09-19 NOTE — Assessment & Plan Note (Signed)
 Encouraged to get adequate exercise, calcium and vitamin d intake

## 2023-09-20 ENCOUNTER — Ambulatory Visit: Payer: Self-pay

## 2023-09-20 NOTE — Telephone Encounter (Signed)
 Pt has been removed from diabetes registry but notes that if any claim comes through for pt with this dx, she will go back on the registry.

## 2023-09-21 ENCOUNTER — Ambulatory Visit: Admitting: Student

## 2023-09-21 ENCOUNTER — Encounter: Payer: Self-pay | Admitting: Student

## 2023-09-21 VITALS — BP 125/62 | HR 76 | Ht 64.0 in | Wt 215.4 lb

## 2023-09-21 DIAGNOSIS — E66812 Obesity, class 2: Secondary | ICD-10-CM | POA: Diagnosis not present

## 2023-09-21 DIAGNOSIS — I5042 Chronic combined systolic (congestive) and diastolic (congestive) heart failure: Secondary | ICD-10-CM | POA: Diagnosis not present

## 2023-09-21 DIAGNOSIS — Z0000 Encounter for general adult medical examination without abnormal findings: Secondary | ICD-10-CM

## 2023-09-21 DIAGNOSIS — M8580 Other specified disorders of bone density and structure, unspecified site: Secondary | ICD-10-CM | POA: Diagnosis not present

## 2023-09-21 DIAGNOSIS — E782 Mixed hyperlipidemia: Secondary | ICD-10-CM

## 2023-09-21 DIAGNOSIS — Z780 Asymptomatic menopausal state: Secondary | ICD-10-CM | POA: Diagnosis not present

## 2023-09-21 DIAGNOSIS — Z853 Personal history of malignant neoplasm of breast: Secondary | ICD-10-CM | POA: Diagnosis not present

## 2023-09-21 DIAGNOSIS — Z6836 Body mass index (BMI) 36.0-36.9, adult: Secondary | ICD-10-CM | POA: Diagnosis not present

## 2023-09-21 DIAGNOSIS — D649 Anemia, unspecified: Secondary | ICD-10-CM | POA: Diagnosis not present

## 2023-09-21 NOTE — Addendum Note (Signed)
 Addended by: TRECIA ALMARIE GRADE on: 09/21/2023 03:13 PM   Modules accepted: Orders

## 2023-09-21 NOTE — Assessment & Plan Note (Signed)
 Update CBC.

## 2023-09-22 ENCOUNTER — Other Ambulatory Visit (INDEPENDENT_AMBULATORY_CARE_PROVIDER_SITE_OTHER)

## 2023-09-22 ENCOUNTER — Ambulatory Visit: Payer: Self-pay

## 2023-09-22 DIAGNOSIS — E782 Mixed hyperlipidemia: Secondary | ICD-10-CM | POA: Diagnosis not present

## 2023-09-22 DIAGNOSIS — M8580 Other specified disorders of bone density and structure, unspecified site: Secondary | ICD-10-CM | POA: Diagnosis not present

## 2023-09-22 DIAGNOSIS — D649 Anemia, unspecified: Secondary | ICD-10-CM | POA: Diagnosis not present

## 2023-09-22 LAB — COMPREHENSIVE METABOLIC PANEL WITH GFR
ALT: 18 U/L (ref 0–35)
AST: 22 U/L (ref 0–37)
Albumin: 4 g/dL (ref 3.5–5.2)
Alkaline Phosphatase: 67 U/L (ref 39–117)
BUN: 10 mg/dL (ref 6–23)
CO2: 26 meq/L (ref 19–32)
Calcium: 9.4 mg/dL (ref 8.4–10.5)
Chloride: 102 meq/L (ref 96–112)
Creatinine, Ser: 0.86 mg/dL (ref 0.40–1.20)
GFR: 64.8 mL/min (ref >60.00)
Glucose, Bld: 75 mg/dL (ref 70–99)
Potassium: 4.2 meq/L (ref 3.5–5.1)
Sodium: 137 meq/L (ref 135–145)
Total Bilirubin: 0.3 mg/dL (ref 0.2–1.2)
Total Protein: 7.2 g/dL (ref 6.0–8.3)

## 2023-09-22 LAB — LIPID PANEL
Cholesterol: 260 mg/dL — ABNORMAL HIGH (ref 0–200)
HDL: 40 mg/dL (ref >39.00)
LDL Cholesterol: 172 mg/dL — ABNORMAL HIGH (ref 0–99)
NonHDL: 220.2
Total CHOL/HDL Ratio: 7
Triglycerides: 241 mg/dL — ABNORMAL HIGH (ref 0.0–149.0)
VLDL: 48.2 mg/dL — ABNORMAL HIGH (ref 0.0–40.0)

## 2023-09-22 LAB — CBC WITH DIFFERENTIAL/PLATELET
Basophils Absolute: 0.1 K/uL (ref 0.0–0.1)
Basophils Relative: 1 % (ref 0.0–3.0)
Eosinophils Absolute: 0.3 K/uL (ref 0.0–0.7)
Eosinophils Relative: 4.1 % (ref 0.0–5.0)
HCT: 38.5 % (ref 36.0–46.0)
Hemoglobin: 12.7 g/dL (ref 12.0–15.0)
Lymphocytes Relative: 42.4 % (ref 12.0–46.0)
Lymphs Abs: 2.6 K/uL (ref 0.7–4.0)
MCHC: 33 g/dL (ref 30.0–36.0)
MCV: 93.1 fl (ref 78.0–100.0)
Monocytes Absolute: 0.4 K/uL (ref 0.1–1.0)
Monocytes Relative: 5.8 % (ref 3.0–12.0)
Neutro Abs: 2.9 K/uL (ref 1.4–7.7)
Neutrophils Relative %: 46.7 % (ref 43.0–77.0)
Platelets: 225 K/uL (ref 150.0–400.0)
RBC: 4.13 Mil/uL (ref 3.87–5.11)
RDW: 14.3 % (ref 11.5–15.5)
WBC: 6.2 K/uL (ref 4.0–10.5)

## 2023-09-22 LAB — TSH: TSH: 0.95 u[IU]/mL (ref 0.35–5.50)

## 2023-09-23 ENCOUNTER — Ambulatory Visit: Payer: Self-pay | Admitting: Family Medicine

## 2023-09-26 DIAGNOSIS — E669 Obesity, unspecified: Secondary | ICD-10-CM | POA: Insufficient documentation

## 2023-09-26 NOTE — Assessment & Plan Note (Addendum)
 Encouraged DASH or MIND diet, decrease po intake and increase exercise as tolerated. Needs 7-8 hours of sleep nightly. Avoid trans fats, eat small, frequent meals every 4-5 hours with lean proteins, complex carbs and healthy fats. Minimize simple carbs, high fat foods and processed foods

## 2023-09-27 ENCOUNTER — Telehealth: Payer: Self-pay

## 2023-09-27 NOTE — Telephone Encounter (Signed)
 Copied from CRM [id number]. Topic: General - Call Back - No Documentation >> Sep 27, 2023  4:16 PM Chiquita SQUIBB wrote: Reason for CRM: Patient is calling Janijah Symons back, please call the patient back when available.

## 2023-09-28 ENCOUNTER — Telehealth: Payer: Self-pay

## 2023-09-28 NOTE — Addendum Note (Signed)
 Addended by: Jasleen Riepe D on: 09/28/2023 02:51 PM   Modules accepted: Orders

## 2023-09-28 NOTE — Telephone Encounter (Signed)
 Copied from CRM [id number]. Topic: Appointments - Scheduling Inquiry for Clinic >> Sep 28, 2023 12:27 PM Sasha M wrote: Reason for CRM: pt would like to schedule bone density. Pt would like an appt on a Wednesday mid morning. Call back [phone] to schedule, thank you

## 2023-09-28 NOTE — Telephone Encounter (Signed)
 [facility] no [facility] does bone density tests, location changed to downstairs.   Spoke w/ Pt- number to imaging [phone]) given to Pt to call and schedule.

## 2023-09-29 ENCOUNTER — Ambulatory Visit (INDEPENDENT_AMBULATORY_CARE_PROVIDER_SITE_OTHER)

## 2023-09-29 DIAGNOSIS — I5022 Chronic systolic (congestive) heart failure: Secondary | ICD-10-CM | POA: Diagnosis not present

## 2023-10-06 LAB — CUP PACEART REMOTE DEVICE CHECK
Battery Remaining Longevity: 31 mo
Battery Voltage: 2.94 V
Brady Statistic AP VP Percent: 0.05 %
Brady Statistic AP VS Percent: 0.01 %
Brady Statistic AS VP Percent: 98.69 %
Brady Statistic AS VS Percent: 1.25 %
Brady Statistic RA Percent Paced: 0.06 %
Brady Statistic RV Percent Paced: 92.64 %
Date Time Interrogation Session: 20250911072846
HighPow Impedance: 77 Ohm
Implantable Lead Connection Status: 753985
Implantable Lead Connection Status: 753985
Implantable Lead Connection Status: 753985
Implantable Lead Implant Date: 20151030
Implantable Lead Implant Date: 20151030
Implantable Lead Implant Date: 20151030
Implantable Lead Location: 753858
Implantable Lead Location: 753859
Implantable Lead Location: 753860
Implantable Lead Model: 4598
Implantable Lead Model: 5076
Implantable Lead Model: 6935
Implantable Pulse Generator Implant Date: 20220602
Lead Channel Impedance Value: 1026 Ohm
Lead Channel Impedance Value: 1197 Ohm
Lead Channel Impedance Value: 1235 Ohm
Lead Channel Impedance Value: 237.865
Lead Channel Impedance Value: 246.655
Lead Channel Impedance Value: 256.983
Lead Channel Impedance Value: 304 Ohm
Lead Channel Impedance Value: 324.377
Lead Channel Impedance Value: 340.944
Lead Channel Impedance Value: 361 Ohm
Lead Channel Impedance Value: 399 Ohm
Lead Channel Impedance Value: 456 Ohm
Lead Channel Impedance Value: 589 Ohm
Lead Channel Impedance Value: 646 Ohm
Lead Channel Impedance Value: 722 Ohm
Lead Channel Impedance Value: 874 Ohm
Lead Channel Impedance Value: 893 Ohm
Lead Channel Impedance Value: 931 Ohm
Lead Channel Pacing Threshold Amplitude: 0.75 V
Lead Channel Pacing Threshold Amplitude: 0.875 V
Lead Channel Pacing Threshold Amplitude: 1.875 V
Lead Channel Pacing Threshold Pulse Width: 0.4 ms
Lead Channel Pacing Threshold Pulse Width: 0.4 ms
Lead Channel Pacing Threshold Pulse Width: 0.8 ms
Lead Channel Sensing Intrinsic Amplitude: 10.25 mV
Lead Channel Sensing Intrinsic Amplitude: 10.25 mV
Lead Channel Sensing Intrinsic Amplitude: 3.875 mV
Lead Channel Sensing Intrinsic Amplitude: 3.875 mV
Lead Channel Setting Pacing Amplitude: 2 V
Lead Channel Setting Pacing Amplitude: 2 V
Lead Channel Setting Pacing Amplitude: 2.5 V
Lead Channel Setting Pacing Pulse Width: 0.4 ms
Lead Channel Setting Pacing Pulse Width: 0.8 ms
Lead Channel Setting Sensing Sensitivity: 0.3 mV
Zone Setting Status: 755011
Zone Setting Status: 755011

## 2023-10-06 NOTE — Progress Notes (Signed)
Remote ICD Transmission.

## 2023-10-07 ENCOUNTER — Ambulatory Visit: Payer: Self-pay | Admitting: Internal Medicine

## 2023-11-16 ENCOUNTER — Inpatient Hospital Stay: Payer: No Typology Code available for payment source

## 2023-11-16 ENCOUNTER — Ambulatory Visit: Payer: No Typology Code available for payment source | Admitting: Hematology & Oncology

## 2023-11-16 LAB — HM MAMMOGRAPHY

## 2023-11-17 ENCOUNTER — Encounter: Payer: Self-pay | Admitting: Hematology & Oncology

## 2023-11-17 ENCOUNTER — Encounter: Payer: Self-pay | Admitting: Family Medicine

## 2023-11-17 ENCOUNTER — Inpatient Hospital Stay: Attending: Hematology & Oncology

## 2023-11-17 ENCOUNTER — Inpatient Hospital Stay: Payer: No Typology Code available for payment source

## 2023-11-17 ENCOUNTER — Ambulatory Visit: Payer: No Typology Code available for payment source | Admitting: Hematology & Oncology

## 2023-11-17 ENCOUNTER — Inpatient Hospital Stay: Admitting: Hematology & Oncology

## 2023-11-17 VITALS — BP 154/66 | HR 75 | Temp 97.6°F | Resp 17 | Wt 221.0 lb

## 2023-11-17 DIAGNOSIS — Z950 Presence of cardiac pacemaker: Secondary | ICD-10-CM | POA: Diagnosis not present

## 2023-11-17 DIAGNOSIS — Z853 Personal history of malignant neoplasm of breast: Secondary | ICD-10-CM | POA: Diagnosis present

## 2023-11-17 LAB — CBC WITH DIFFERENTIAL (CANCER CENTER ONLY)
Abs Immature Granulocytes: 0.02 K/uL (ref 0.00–0.07)
Basophils Absolute: 0 K/uL (ref 0.0–0.1)
Basophils Relative: 1 %
Eosinophils Absolute: 0.4 K/uL (ref 0.0–0.5)
Eosinophils Relative: 6 %
HCT: 39.6 % (ref 36.0–46.0)
Hemoglobin: 13.1 g/dL (ref 12.0–15.0)
Immature Granulocytes: 0 %
Lymphocytes Relative: 41 %
Lymphs Abs: 2.5 K/uL (ref 0.7–4.0)
MCH: 31.3 pg (ref 26.0–34.0)
MCHC: 33.1 g/dL (ref 30.0–36.0)
MCV: 94.7 fL (ref 80.0–100.0)
Monocytes Absolute: 0.3 K/uL (ref 0.1–1.0)
Monocytes Relative: 6 %
Neutro Abs: 2.8 K/uL (ref 1.7–7.7)
Neutrophils Relative %: 46 %
Platelet Count: 248 K/uL (ref 150–400)
RBC: 4.18 MIL/uL (ref 3.87–5.11)
RDW: 14.2 % (ref 11.5–15.5)
WBC Count: 6.1 K/uL (ref 4.0–10.5)
nRBC: 0 % (ref 0.0–0.2)

## 2023-11-17 LAB — CMP (CANCER CENTER ONLY)
ALT: 23 U/L (ref 0–44)
AST: 31 U/L (ref 15–41)
Albumin: 4.6 g/dL (ref 3.5–5.0)
Alkaline Phosphatase: 89 U/L (ref 38–126)
Anion gap: 12 (ref 5–15)
BUN: 8 mg/dL (ref 8–23)
CO2: 23 mmol/L (ref 22–32)
Calcium: 10.4 mg/dL — ABNORMAL HIGH (ref 8.9–10.3)
Chloride: 105 mmol/L (ref 98–111)
Creatinine: 0.79 mg/dL (ref 0.44–1.00)
GFR, Estimated: >60 mL/min (ref >60)
Glucose, Bld: 77 mg/dL (ref 70–99)
Potassium: 4.1 mmol/L (ref 3.5–5.1)
Sodium: 140 mmol/L (ref 135–145)
Total Bilirubin: 0.3 mg/dL (ref 0.0–1.2)
Total Protein: 8.6 g/dL — ABNORMAL HIGH (ref 6.5–8.1)

## 2023-11-17 LAB — LACTATE DEHYDROGENASE: LDH: 314 U/L — ABNORMAL HIGH (ref 98–192)

## 2023-11-17 NOTE — Progress Notes (Signed)
 Hematology and Oncology Follow Up Visit  Brenda Marsh [id number] July 18, 1945 78 y.o. 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 She comes back for her yearly visit.  She is doing pretty well.  She really has had no complaints since last saw her.  She has a pacemaker in..  The real problem that we have is that she has a horrible IV access.  We only have 1 arm to use.  She has lymphedema in the left arm.  She does have the pacemaker over on the right chest wall.  She really needs to have some type of central access.  I will have to speak with IR to see if they can put a Port-A-Cath into her.  I just think this will make you live a whole lot easier for her.  Otherwise, she had a mammogram done yesterday.  The mammogram looked fine.  She has had no change in bowel or bladder habits.  She has had no problems with nausea or vomiting.  She has had no problems with cough or shortness of breath.  She has had no bleeding.  Overall, I would say that her performance status is probably ECOG 1.   .    Medications:  Allergies as of 11/17/2023       Reactions   Atorvastatin Other (See Comments)   Myalgia, weakness   Naproxen Sodium Shortness Of Breath   Difficulty breathing   Shellfish Allergy  Shortness Of Breath   Cant breath   Contrast Media [iodinated Contrast Media] Nausea And Vomiting   Crestor [rosuvastatin] Other (See Comments)   Myalgia, weakness   Metrizamide Nausea And Vomiting   Lisinopril Swelling, Cough   Difficulty breathing   Penicillins Rash   Rash on arms   Simvastatin Other (See Comments)   Myalgia, weakness        Medication List        Accurate as of November 17, 2023 12:26 PM. If you have any questions, ask your nurse or doctor.          ascorbic acid 500 MG tablet Commonly known as: VITAMIN  C Take 500 mg by mouth every evening.   bisoprolol  5 MG tablet Commonly known as: ZEBETA  TAKE 1/2 TABLET (2.5 MG TOTAL) BY MOUTH DAILY   cyanocobalamin  1000 MCG tablet Commonly known as: VITAMIN B12 Take 1,000 mcg by mouth daily.   meclizine  12.5 MG tablet Commonly known as: ANTIVERT  Take 1 tablet (12.5 mg total) by mouth 3 (three) times daily as needed for dizziness.   multivitamin with minerals Tabs tablet Take 1 tablet by mouth every evening.   spironolactone  25 MG tablet Commonly known as: ALDACTONE  TAKE 1/2 TABLET BY MOUTH EVERY DAY   Vitamin D3 50 MCG (2000 UT) Tabs Take 6,000 Units by mouth every evening.        Allergies:   Past Medical History, Surgical history, Social history, and Family History were reviewed and updated.  Review of Systems: Review of Systems  Constitutional: Negative.   HENT: Negative.    Eyes: Negative.   Respiratory: Negative.    Cardiovascular: Negative.   Gastrointestinal: Negative.   Genitourinary: Negative.   Musculoskeletal: Negative.   Skin: Negative.   Neurological: Negative.   Endo/Heme/Allergies: Negative.   Psychiatric/Behavioral: Negative.       Physical Exam:  weight  is 221 lb (100.2 kg). Her oral temperature is 97.6 F (36.4 C). Her blood pressure is 154/66 (abnormal) and her pulse is 75. Her respiration is 17 and oxygen saturation is 100%.   Wt Readings from Last 3 Encounters:  11/17/23 221 lb (100.2 kg)  09/21/23 215 lb 6.4 oz (97.7 kg)  09/12/23 215 lb (97.5 kg)    Physical Exam Vitals reviewed.  Constitutional:      Comments: Breast exam shows her right breast with no masses, edema or erythema.  There is no right axillary adenopathy.  Left breast is slightly contracted from surgery and radiation.  She has a well-healed lumpectomy scar at the 2 o'clock position.  There is some firmness at the lumpectomy site.  She has no distinct mass in the left breast.  There is no left axillary adenopathy.  HENT:      Head: Normocephalic and atraumatic.  Eyes:     Pupils: Pupils are equal, round, and reactive to light.  Cardiovascular:     Rate and Rhythm: Normal rate and regular rhythm.     Heart sounds: Normal heart sounds.  Pulmonary:     Effort: Pulmonary effort is normal.     Breath sounds: Normal breath sounds.  Abdominal:     General: Bowel sounds are normal.     Palpations: Abdomen is soft.  Musculoskeletal:        General: No tenderness or deformity. Normal range of motion.     Cervical back: Normal range of motion.     Comments: There is lymphedema in the left arm.  This is moderate.  There is no erythema or warmth.  She has good range of motion of the left arm.  Lymphadenopathy:     Cervical: No cervical adenopathy.  Skin:    General: Skin is warm and dry.     Findings: No erythema or rash.  Neurological:     Mental Status: She is alert and oriented to person, place, and time.  Psychiatric:        Behavior: Behavior normal.        Thought Content: Thought content normal.        Judgment: Judgment normal.     Lab Results  Component Value Date   WBC 6.1 11/17/2023   HGB 13.1 11/17/2023   HCT 39.6 11/17/2023   MCV 94.7 11/17/2023   PLT 248 11/17/2023   Lab Results  Component Value Date   IRON 56 12/21/2012   TIBC 429 12/21/2012   UIBC 373 12/21/2012   IRONPCTSAT 13 (L) 12/21/2012   Lab Results  Component Value Date   RBC 4.18 11/17/2023   No results found for: KPAFRELGTCHN, LAMBDASER, KAPLAMBRATIO No results found for: IGGSERUM, IGA, IGMSERUM No results found for: STEPHANY CARLOTA BENSON MARKEL EARLA JOANNIE DOC VICK, SPEI   Chemistry      Component Value Date/Time   NA 140 11/17/2023 1011   NA 141 04/07/2023 0916   NA 143 09/29/2016 1500   K 4.1 11/17/2023 1011   K 3.8 09/29/2016 1500   CL 105 11/17/2023 1011   CL 105 09/29/2016 1500   CO2 23 11/17/2023 1011   CO2 28 09/29/2016 1500   BUN 8 11/17/2023 1011   BUN 8  04/07/2023 0916   BUN 12 09/29/2016 1500   CREATININE 0.79 11/17/2023 1011   CREATININE 0.9 09/29/2016 1500      Component Value Date/Time   CALCIUM  10.4 (H) 11/17/2023 1011   CALCIUM  9.7 09/29/2016 1500  ALKPHOS 89 11/17/2023 1011   ALKPHOS 81 09/29/2016 1500   AST 31 11/17/2023 1011   ALT 23 11/17/2023 1011   ALT 23 09/29/2016 1500   BILITOT 0.3 11/17/2023 1011     Impression and Plan: Ms. Mignogna is a 78 year old African-American female with stage IIA ductal carcinoma the left breast. She had 1 lymph node positive. Her tumor was ER positive. She was on tamoxifen.  She is approximate 23 years out from treatment.  Again, the real problem is IV access.  We clearly need to have a better source of blood supply for labs.  I will have to see about a Port-A-Cath.  Again she has the pacemaker in the right side also.  She does not want anything on the left side.  We will plan to get her back in another year.  Maude JONELLE Crease, MD 10/30/202512:26 PM

## 2023-12-05 ENCOUNTER — Ambulatory Visit (HOSPITAL_BASED_OUTPATIENT_CLINIC_OR_DEPARTMENT_OTHER)
Admission: RE | Admit: 2023-12-05 | Discharge: 2023-12-05 | Disposition: A | Discharge status: Home / Self Care | Source: Ambulatory Visit | Attending: Student | Admitting: Student

## 2023-12-05 DIAGNOSIS — Z780 Asymptomatic menopausal state: Secondary | ICD-10-CM | POA: Insufficient documentation

## 2023-12-20 ENCOUNTER — Ambulatory Visit: Payer: Self-pay

## 2023-12-22 ENCOUNTER — Ambulatory Visit: Payer: Self-pay

## 2023-12-29 ENCOUNTER — Ambulatory Visit: Payer: Self-pay

## 2023-12-29 DIAGNOSIS — I5022 Chronic systolic (congestive) heart failure: Secondary | ICD-10-CM | POA: Diagnosis not present

## 2023-12-30 LAB — CUP PACEART REMOTE DEVICE CHECK
Battery Remaining Longevity: 28 mo
Battery Voltage: 2.94 V
Brady Statistic AP VP Percent: 0.07 %
Brady Statistic AP VS Percent: 0.01 %
Brady Statistic AS VP Percent: 98.56 %
Brady Statistic AS VS Percent: 1.37 %
Brady Statistic RA Percent Paced: 0.08 %
Brady Statistic RV Percent Paced: 91.56 %
Date Time Interrogation Session: 20251211061812
HighPow Impedance: 70 Ohm
Implantable Lead Connection Status: 753985
Implantable Lead Connection Status: 753985
Implantable Lead Connection Status: 753985
Implantable Lead Implant Date: 20151030
Implantable Lead Implant Date: 20151030
Implantable Lead Implant Date: 20151030
Implantable Lead Location: 753858
Implantable Lead Location: 753859
Implantable Lead Location: 753860
Implantable Lead Model: 4598
Implantable Lead Model: 5076
Implantable Lead Model: 6935
Implantable Pulse Generator Implant Date: 20220602
Lead Channel Impedance Value: 1083 Ohm
Lead Channel Impedance Value: 1235 Ohm
Lead Channel Impedance Value: 1235 Ohm
Lead Channel Impedance Value: 240.906
Lead Channel Impedance Value: 249.375
Lead Channel Impedance Value: 254.534
Lead Channel Impedance Value: 304 Ohm
Lead Channel Impedance Value: 326.029
Lead Channel Impedance Value: 341.736
Lead Channel Impedance Value: 361 Ohm
Lead Channel Impedance Value: 399 Ohm
Lead Channel Impedance Value: 456 Ohm
Lead Channel Impedance Value: 608 Ohm
Lead Channel Impedance Value: 665 Ohm
Lead Channel Impedance Value: 703 Ohm
Lead Channel Impedance Value: 893 Ohm
Lead Channel Impedance Value: 931 Ohm
Lead Channel Impedance Value: 988 Ohm
Lead Channel Pacing Threshold Amplitude: 0.75 V
Lead Channel Pacing Threshold Amplitude: 0.875 V
Lead Channel Pacing Threshold Amplitude: 1.75 V
Lead Channel Pacing Threshold Pulse Width: 0.4 ms
Lead Channel Pacing Threshold Pulse Width: 0.4 ms
Lead Channel Pacing Threshold Pulse Width: 0.8 ms
Lead Channel Sensing Intrinsic Amplitude: 1 mV
Lead Channel Sensing Intrinsic Amplitude: 1 mV
Lead Channel Sensing Intrinsic Amplitude: 3.125 mV
Lead Channel Sensing Intrinsic Amplitude: 3.125 mV
Lead Channel Setting Pacing Amplitude: 2 V
Lead Channel Setting Pacing Amplitude: 2 V
Lead Channel Setting Pacing Amplitude: 2.5 V
Lead Channel Setting Pacing Pulse Width: 0.4 ms
Lead Channel Setting Pacing Pulse Width: 0.8 ms
Lead Channel Setting Sensing Sensitivity: 0.3 mV
Zone Setting Status: 755011
Zone Setting Status: 755011

## 2024-01-01 ENCOUNTER — Ambulatory Visit: Payer: Self-pay | Admitting: Internal Medicine

## 2024-01-05 NOTE — Progress Notes (Signed)
 Remote ICD Transmission

## 2024-02-10 ENCOUNTER — Telehealth: Payer: Self-pay | Admitting: Family Medicine

## 2024-02-10 NOTE — Telephone Encounter (Signed)
 Copied from CRM #[id number]. Topic: Appointments - Scheduling Inquiry for Clinic >> Feb 10, 2024 11:10 AM Avram MATSU wrote: Reason for CRM: labs order will be sent over [facility], please advise patient to schedule an appt.  [phone]   ----------------------------------------------------------------------- From previous Reason for Contact - Scheduling: Patient/patient representative is calling to schedule an appointment. Refer to attachments for appointment information.

## 2024-02-22 ENCOUNTER — Other Ambulatory Visit: Payer: Self-pay | Admitting: Cardiology

## 2024-02-24 ENCOUNTER — Telehealth: Payer: Self-pay

## 2024-02-24 NOTE — Telephone Encounter (Signed)
 Spoke w/ Eveline with [facility] and he stated that call can not be transferred to Veronica. Eveline stated that will have to wait for the person to return call.

## 2024-02-24 NOTE — Telephone Encounter (Signed)
 Copied from CRM #[id number]. Topic: Clinical - Request for Lab/Test Order >> Feb 24, 2024  2:22 PM Fonda T wrote: Reason for CRM: Received call from Veronica, Clinical associate  with [facility]  Medical  Calling to check the status of a lab order request that was faxed to office on 02/10/24, for tests to be ordered, BMP, and Urinalysis with BUN and creatinine ratio.  Requesting a follow up call to advise further.  Can be reached at Clinical assosciate with [facility] medical [phone].

## 2024-03-20 ENCOUNTER — Ambulatory Visit: Payer: Self-pay

## 2024-03-21 ENCOUNTER — Ambulatory Visit: Admitting: Student

## 2024-03-21 ENCOUNTER — Ambulatory Visit: Admitting: Cardiology

## 2024-03-22 ENCOUNTER — Ambulatory Visit: Payer: Self-pay

## 2024-03-29 ENCOUNTER — Ambulatory Visit: Payer: Self-pay

## 2024-06-28 ENCOUNTER — Ambulatory Visit: Payer: Self-pay

## 2024-09-12 ENCOUNTER — Ambulatory Visit

## 2024-09-27 ENCOUNTER — Ambulatory Visit: Payer: Self-pay

## 2024-11-28 ENCOUNTER — Inpatient Hospital Stay: Admitting: Hematology & Oncology

## 2024-11-28 ENCOUNTER — Inpatient Hospital Stay

## 2024-12-27 ENCOUNTER — Ambulatory Visit: Payer: Self-pay

## 2025-03-28 ENCOUNTER — Ambulatory Visit: Payer: Self-pay

## 2025-06-27 ENCOUNTER — Ambulatory Visit: Payer: Self-pay

## 2025-09-26 ENCOUNTER — Ambulatory Visit: Payer: Self-pay

## 2025-12-26 ENCOUNTER — Ambulatory Visit: Payer: Self-pay

## 2026-03-27 ENCOUNTER — Ambulatory Visit: Payer: Self-pay
